# Patient Record
Sex: Female | Born: 1975 | Race: Black or African American | Hispanic: No | Marital: Single | Smoking: Never smoker
Health system: Southern US, Community
[De-identification: ages and names within clinical notes are randomized; demographics above are authoritative.]

## PROBLEM LIST (undated history)

## (undated) ENCOUNTER — Emergency Department (HOSPITAL_COMMUNITY): Admission: EM | Payer: Medicare Other

## (undated) DIAGNOSIS — L709 Acne, unspecified: Secondary | ICD-10-CM

## (undated) DIAGNOSIS — D649 Anemia, unspecified: Secondary | ICD-10-CM

## (undated) DIAGNOSIS — F70 Mild intellectual disabilities: Secondary | ICD-10-CM

## (undated) DIAGNOSIS — I471 Supraventricular tachycardia, unspecified: Secondary | ICD-10-CM

## (undated) DIAGNOSIS — K648 Other hemorrhoids: Secondary | ICD-10-CM

## (undated) DIAGNOSIS — E669 Obesity, unspecified: Secondary | ICD-10-CM

## (undated) DIAGNOSIS — E119 Type 2 diabetes mellitus without complications: Secondary | ICD-10-CM

## (undated) HISTORY — DX: Obesity, unspecified: E66.9

## (undated) HISTORY — DX: Anemia, unspecified: D64.9

## (undated) HISTORY — DX: Supraventricular tachycardia, unspecified: I47.10

## (undated) HISTORY — DX: Other hemorrhoids: K64.8

## (undated) HISTORY — DX: Supraventricular tachycardia: I47.1

---

## 1997-11-27 ENCOUNTER — Inpatient Hospital Stay (HOSPITAL_COMMUNITY): Admission: AD | Admit: 1997-11-27 | Discharge: 1997-12-04 | Payer: Self-pay | Admitting: Family Medicine

## 1997-12-18 ENCOUNTER — Emergency Department (HOSPITAL_COMMUNITY): Admission: EM | Admit: 1997-12-18 | Discharge: 1997-12-18 | Payer: Self-pay | Admitting: Emergency Medicine

## 1998-08-22 ENCOUNTER — Encounter: Admission: RE | Admit: 1998-08-22 | Discharge: 1998-08-22 | Payer: Self-pay | Admitting: Family Medicine

## 1998-09-24 ENCOUNTER — Encounter: Admission: RE | Admit: 1998-09-24 | Discharge: 1998-09-24 | Payer: Self-pay | Admitting: Family Medicine

## 1998-11-02 ENCOUNTER — Encounter: Admission: RE | Admit: 1998-11-02 | Discharge: 1998-11-02 | Payer: Self-pay | Admitting: Family Medicine

## 1998-11-20 ENCOUNTER — Encounter: Admission: RE | Admit: 1998-11-20 | Discharge: 1998-11-20 | Payer: Self-pay | Admitting: Sports Medicine

## 1999-01-22 ENCOUNTER — Encounter: Admission: RE | Admit: 1999-01-22 | Discharge: 1999-01-22 | Payer: Self-pay | Admitting: Family Medicine

## 1999-02-05 ENCOUNTER — Other Ambulatory Visit: Admission: RE | Admit: 1999-02-05 | Discharge: 1999-02-05 | Payer: Self-pay | Admitting: *Deleted

## 1999-02-05 ENCOUNTER — Encounter: Admission: RE | Admit: 1999-02-05 | Discharge: 1999-02-05 | Payer: Self-pay | Admitting: Family Medicine

## 1999-02-11 ENCOUNTER — Ambulatory Visit (HOSPITAL_COMMUNITY): Admission: RE | Admit: 1999-02-11 | Discharge: 1999-02-11 | Payer: Self-pay | Admitting: Sports Medicine

## 1999-02-11 ENCOUNTER — Encounter: Payer: Self-pay | Admitting: Sports Medicine

## 1999-11-18 ENCOUNTER — Encounter: Admission: RE | Admit: 1999-11-18 | Discharge: 1999-11-18 | Payer: Self-pay | Admitting: Family Medicine

## 2000-01-16 ENCOUNTER — Encounter: Admission: RE | Admit: 2000-01-16 | Discharge: 2000-01-16 | Payer: Self-pay | Admitting: Family Medicine

## 2000-03-02 ENCOUNTER — Encounter: Admission: RE | Admit: 2000-03-02 | Discharge: 2000-03-02 | Payer: Self-pay | Admitting: Family Medicine

## 2000-05-26 ENCOUNTER — Encounter: Admission: RE | Admit: 2000-05-26 | Discharge: 2000-05-26 | Payer: Self-pay | Admitting: Sports Medicine

## 2000-06-02 ENCOUNTER — Encounter: Payer: Self-pay | Admitting: Chiropractic Medicine

## 2000-06-02 ENCOUNTER — Encounter: Admission: RE | Admit: 2000-06-02 | Discharge: 2000-06-02 | Payer: Self-pay | Admitting: Chiropractic Medicine

## 2000-07-27 ENCOUNTER — Encounter: Admission: RE | Admit: 2000-07-27 | Discharge: 2000-07-27 | Payer: Self-pay | Admitting: Family Medicine

## 2000-08-19 ENCOUNTER — Encounter: Admission: RE | Admit: 2000-08-19 | Discharge: 2000-08-19 | Payer: Self-pay | Admitting: Family Medicine

## 2000-08-21 ENCOUNTER — Encounter: Admission: RE | Admit: 2000-08-21 | Discharge: 2000-08-21 | Payer: Self-pay | Admitting: Family Medicine

## 2000-10-23 ENCOUNTER — Encounter: Admission: RE | Admit: 2000-10-23 | Discharge: 2000-10-23 | Payer: Self-pay | Admitting: Family Medicine

## 2000-11-17 ENCOUNTER — Encounter: Admission: RE | Admit: 2000-11-17 | Discharge: 2000-11-17 | Payer: Self-pay | Admitting: Sports Medicine

## 2001-02-19 ENCOUNTER — Encounter: Admission: RE | Admit: 2001-02-19 | Discharge: 2001-02-19 | Payer: Self-pay | Admitting: Family Medicine

## 2001-04-21 ENCOUNTER — Emergency Department (HOSPITAL_COMMUNITY): Admission: EM | Admit: 2001-04-21 | Discharge: 2001-04-21 | Payer: Self-pay

## 2001-05-19 ENCOUNTER — Encounter: Admission: RE | Admit: 2001-05-19 | Discharge: 2001-05-19 | Payer: Self-pay | Admitting: Family Medicine

## 2001-08-05 ENCOUNTER — Ambulatory Visit (HOSPITAL_COMMUNITY): Admission: RE | Admit: 2001-08-05 | Discharge: 2001-08-05 | Payer: Self-pay | Admitting: Family Medicine

## 2001-08-05 ENCOUNTER — Encounter: Admission: RE | Admit: 2001-08-05 | Discharge: 2001-08-05 | Payer: Self-pay | Admitting: Family Medicine

## 2001-08-11 ENCOUNTER — Emergency Department (HOSPITAL_COMMUNITY): Admission: EM | Admit: 2001-08-11 | Discharge: 2001-08-11 | Payer: Self-pay | Admitting: Emergency Medicine

## 2001-08-13 ENCOUNTER — Inpatient Hospital Stay (HOSPITAL_COMMUNITY): Admission: EM | Admit: 2001-08-13 | Discharge: 2001-08-17 | Payer: Self-pay | Admitting: *Deleted

## 2001-08-17 ENCOUNTER — Inpatient Hospital Stay (HOSPITAL_COMMUNITY): Admission: EM | Admit: 2001-08-17 | Discharge: 2001-08-26 | Payer: Self-pay | Admitting: Psychiatry

## 2001-08-18 ENCOUNTER — Encounter: Admission: RE | Admit: 2001-08-18 | Discharge: 2001-08-18 | Payer: Self-pay | Admitting: Family Medicine

## 2001-11-23 ENCOUNTER — Encounter: Admission: RE | Admit: 2001-11-23 | Discharge: 2001-11-23 | Payer: Self-pay | Admitting: Family Medicine

## 2002-01-20 ENCOUNTER — Encounter: Admission: RE | Admit: 2002-01-20 | Discharge: 2002-01-20 | Payer: Self-pay | Admitting: Family Medicine

## 2002-02-03 ENCOUNTER — Encounter: Admission: RE | Admit: 2002-02-03 | Discharge: 2002-02-03 | Payer: Self-pay | Admitting: Family Medicine

## 2002-05-20 ENCOUNTER — Encounter (INDEPENDENT_AMBULATORY_CARE_PROVIDER_SITE_OTHER): Payer: Self-pay | Admitting: *Deleted

## 2002-05-20 ENCOUNTER — Ambulatory Visit (HOSPITAL_BASED_OUTPATIENT_CLINIC_OR_DEPARTMENT_OTHER): Admission: RE | Admit: 2002-05-20 | Discharge: 2002-05-20 | Payer: Self-pay | Admitting: Ophthalmology

## 2002-06-02 ENCOUNTER — Encounter: Admission: RE | Admit: 2002-06-02 | Discharge: 2002-06-02 | Payer: Self-pay | Admitting: Family Medicine

## 2002-06-30 ENCOUNTER — Encounter: Admission: RE | Admit: 2002-06-30 | Discharge: 2002-06-30 | Payer: Self-pay | Admitting: Radiology

## 2002-07-26 ENCOUNTER — Encounter: Admission: RE | Admit: 2002-07-26 | Discharge: 2002-07-26 | Payer: Self-pay | Admitting: Family Medicine

## 2002-08-10 ENCOUNTER — Encounter: Admission: RE | Admit: 2002-08-10 | Discharge: 2002-08-10 | Payer: Self-pay | Admitting: Family Medicine

## 2002-11-23 ENCOUNTER — Encounter: Admission: RE | Admit: 2002-11-23 | Discharge: 2002-11-23 | Payer: Self-pay | Admitting: Family Medicine

## 2002-11-25 ENCOUNTER — Encounter: Admission: RE | Admit: 2002-11-25 | Discharge: 2002-11-25 | Payer: Self-pay | Admitting: Sports Medicine

## 2002-12-16 ENCOUNTER — Encounter: Admission: RE | Admit: 2002-12-16 | Discharge: 2002-12-16 | Payer: Self-pay | Admitting: Family Medicine

## 2003-01-12 ENCOUNTER — Emergency Department (HOSPITAL_COMMUNITY): Admission: EM | Admit: 2003-01-12 | Discharge: 2003-01-12 | Payer: Self-pay | Admitting: Emergency Medicine

## 2003-01-13 ENCOUNTER — Inpatient Hospital Stay (HOSPITAL_COMMUNITY): Admission: EM | Admit: 2003-01-13 | Discharge: 2003-01-16 | Payer: Self-pay | Admitting: Emergency Medicine

## 2003-01-28 ENCOUNTER — Emergency Department (HOSPITAL_COMMUNITY): Admission: EM | Admit: 2003-01-28 | Discharge: 2003-01-28 | Payer: Self-pay | Admitting: Emergency Medicine

## 2003-02-13 ENCOUNTER — Encounter: Admission: RE | Admit: 2003-02-13 | Discharge: 2003-02-13 | Payer: Self-pay | Admitting: Family Medicine

## 2003-03-02 ENCOUNTER — Emergency Department (HOSPITAL_COMMUNITY): Admission: EM | Admit: 2003-03-02 | Discharge: 2003-03-03 | Payer: Self-pay

## 2003-03-03 ENCOUNTER — Encounter: Payer: Self-pay | Admitting: Emergency Medicine

## 2003-03-06 ENCOUNTER — Emergency Department (HOSPITAL_COMMUNITY): Admission: EM | Admit: 2003-03-06 | Discharge: 2003-03-06 | Payer: Self-pay | Admitting: Emergency Medicine

## 2003-04-30 ENCOUNTER — Emergency Department (HOSPITAL_COMMUNITY): Admission: EM | Admit: 2003-04-30 | Discharge: 2003-04-30 | Payer: Self-pay | Admitting: Emergency Medicine

## 2003-05-01 ENCOUNTER — Emergency Department (HOSPITAL_COMMUNITY): Admission: EM | Admit: 2003-05-01 | Discharge: 2003-05-01 | Payer: Self-pay | Admitting: Emergency Medicine

## 2003-05-22 ENCOUNTER — Encounter: Admission: RE | Admit: 2003-05-22 | Discharge: 2003-05-22 | Payer: Self-pay | Admitting: Sports Medicine

## 2003-06-22 ENCOUNTER — Encounter: Admission: RE | Admit: 2003-06-22 | Discharge: 2003-06-22 | Payer: Self-pay | Admitting: Sports Medicine

## 2003-09-28 ENCOUNTER — Encounter: Admission: RE | Admit: 2003-09-28 | Discharge: 2003-09-28 | Payer: Self-pay | Admitting: *Deleted

## 2004-01-11 ENCOUNTER — Ambulatory Visit: Payer: Self-pay | Admitting: Sports Medicine

## 2004-03-07 ENCOUNTER — Ambulatory Visit: Payer: Self-pay | Admitting: Family Medicine

## 2004-03-08 ENCOUNTER — Ambulatory Visit (HOSPITAL_COMMUNITY): Admission: RE | Admit: 2004-03-08 | Discharge: 2004-03-08 | Payer: Self-pay | Admitting: *Deleted

## 2004-03-12 ENCOUNTER — Ambulatory Visit (HOSPITAL_COMMUNITY): Admission: RE | Admit: 2004-03-12 | Discharge: 2004-03-12 | Payer: Self-pay | Admitting: *Deleted

## 2004-03-28 ENCOUNTER — Ambulatory Visit: Payer: Self-pay | Admitting: Obstetrics and Gynecology

## 2004-04-15 ENCOUNTER — Encounter (INDEPENDENT_AMBULATORY_CARE_PROVIDER_SITE_OTHER): Payer: Self-pay | Admitting: *Deleted

## 2004-04-24 ENCOUNTER — Ambulatory Visit: Payer: Self-pay | Admitting: Family Medicine

## 2004-05-07 ENCOUNTER — Encounter: Admission: RE | Admit: 2004-05-07 | Discharge: 2004-08-05 | Payer: Self-pay | Admitting: Family Medicine

## 2004-05-24 ENCOUNTER — Ambulatory Visit: Payer: Self-pay | Admitting: Family Medicine

## 2004-08-09 ENCOUNTER — Ambulatory Visit: Payer: Self-pay | Admitting: Obstetrics & Gynecology

## 2004-08-20 ENCOUNTER — Ambulatory Visit: Payer: Self-pay | Admitting: Obstetrics & Gynecology

## 2004-09-09 HISTORY — PX: HYSTEROSCOPY: SHX211

## 2004-09-30 ENCOUNTER — Ambulatory Visit (HOSPITAL_COMMUNITY): Admission: RE | Admit: 2004-09-30 | Discharge: 2004-09-30 | Payer: Self-pay | Admitting: Obstetrics & Gynecology

## 2004-09-30 ENCOUNTER — Ambulatory Visit: Payer: Self-pay | Admitting: Obstetrics & Gynecology

## 2004-09-30 ENCOUNTER — Encounter (INDEPENDENT_AMBULATORY_CARE_PROVIDER_SITE_OTHER): Payer: Self-pay | Admitting: *Deleted

## 2004-10-14 ENCOUNTER — Ambulatory Visit: Payer: Self-pay | Admitting: Family Medicine

## 2004-10-25 ENCOUNTER — Ambulatory Visit: Payer: Self-pay | Admitting: *Deleted

## 2004-11-05 ENCOUNTER — Ambulatory Visit: Payer: Self-pay | Admitting: Obstetrics and Gynecology

## 2004-11-14 ENCOUNTER — Encounter: Admission: RE | Admit: 2004-11-14 | Discharge: 2004-11-14 | Payer: Self-pay | Admitting: Family Medicine

## 2005-01-21 ENCOUNTER — Ambulatory Visit: Payer: Self-pay | Admitting: *Deleted

## 2005-02-11 ENCOUNTER — Ambulatory Visit: Payer: Self-pay | Admitting: *Deleted

## 2005-04-08 ENCOUNTER — Ambulatory Visit: Payer: Self-pay | Admitting: *Deleted

## 2005-04-24 ENCOUNTER — Ambulatory Visit: Payer: Self-pay | Admitting: Sports Medicine

## 2005-05-12 HISTORY — PX: REDUCTION MAMMAPLASTY: SUR839

## 2005-05-22 ENCOUNTER — Encounter: Admission: RE | Admit: 2005-05-22 | Discharge: 2005-08-20 | Payer: Self-pay | Admitting: Family Medicine

## 2005-06-24 ENCOUNTER — Ambulatory Visit: Payer: Self-pay | Admitting: *Deleted

## 2005-08-04 ENCOUNTER — Encounter (INDEPENDENT_AMBULATORY_CARE_PROVIDER_SITE_OTHER): Payer: Self-pay | Admitting: *Deleted

## 2005-08-04 ENCOUNTER — Ambulatory Visit (HOSPITAL_BASED_OUTPATIENT_CLINIC_OR_DEPARTMENT_OTHER): Admission: RE | Admit: 2005-08-04 | Discharge: 2005-08-05 | Payer: Self-pay | Admitting: Specialist

## 2005-08-10 HISTORY — PX: BREAST REDUCTION SURGERY: SHX8

## 2005-08-20 ENCOUNTER — Encounter: Admission: RE | Admit: 2005-08-20 | Discharge: 2005-08-20 | Payer: Self-pay | Admitting: Family Medicine

## 2005-09-15 ENCOUNTER — Ambulatory Visit: Payer: Self-pay | Admitting: *Deleted

## 2005-10-21 ENCOUNTER — Ambulatory Visit: Payer: Self-pay | Admitting: Sports Medicine

## 2005-11-19 ENCOUNTER — Encounter: Admission: RE | Admit: 2005-11-19 | Discharge: 2005-11-19 | Payer: Self-pay | Admitting: Family Medicine

## 2005-12-01 ENCOUNTER — Ambulatory Visit: Payer: Self-pay | Admitting: Obstetrics & Gynecology

## 2006-02-17 ENCOUNTER — Ambulatory Visit: Payer: Self-pay | Admitting: Obstetrics and Gynecology

## 2006-02-19 ENCOUNTER — Encounter: Admission: RE | Admit: 2006-02-19 | Discharge: 2006-05-20 | Payer: Self-pay | Admitting: Family Medicine

## 2006-02-27 ENCOUNTER — Ambulatory Visit: Payer: Self-pay | Admitting: Family Medicine

## 2006-04-10 ENCOUNTER — Ambulatory Visit: Payer: Self-pay | Admitting: Family Medicine

## 2006-05-06 ENCOUNTER — Ambulatory Visit: Payer: Self-pay | Admitting: Obstetrics & Gynecology

## 2006-05-21 ENCOUNTER — Encounter: Admission: RE | Admit: 2006-05-21 | Discharge: 2006-08-19 | Payer: Self-pay | Admitting: Family Medicine

## 2006-07-09 DIAGNOSIS — F79 Unspecified intellectual disabilities: Secondary | ICD-10-CM | POA: Insufficient documentation

## 2006-07-09 DIAGNOSIS — F209 Schizophrenia, unspecified: Secondary | ICD-10-CM | POA: Insufficient documentation

## 2006-07-10 ENCOUNTER — Encounter (INDEPENDENT_AMBULATORY_CARE_PROVIDER_SITE_OTHER): Payer: Self-pay | Admitting: *Deleted

## 2006-08-03 ENCOUNTER — Ambulatory Visit: Payer: Self-pay | Admitting: Gynecology

## 2006-08-25 ENCOUNTER — Encounter (INDEPENDENT_AMBULATORY_CARE_PROVIDER_SITE_OTHER): Payer: Self-pay | Admitting: Family Medicine

## 2006-09-02 ENCOUNTER — Encounter (INDEPENDENT_AMBULATORY_CARE_PROVIDER_SITE_OTHER): Payer: Self-pay | Admitting: Family Medicine

## 2006-10-08 ENCOUNTER — Ambulatory Visit: Payer: Self-pay | Admitting: Family Medicine

## 2006-10-08 ENCOUNTER — Encounter (INDEPENDENT_AMBULATORY_CARE_PROVIDER_SITE_OTHER): Payer: Self-pay | Admitting: Family Medicine

## 2006-10-08 ENCOUNTER — Ambulatory Visit (HOSPITAL_COMMUNITY): Admission: RE | Admit: 2006-10-08 | Discharge: 2006-10-08 | Payer: Self-pay | Admitting: Family Medicine

## 2006-10-08 LAB — CONVERTED CEMR LAB
Chloride: 111 meq/L (ref 96–112)
Creatinine, Ser: 1.57 mg/dL — ABNORMAL HIGH (ref 0.40–1.20)
MCV: 88.5 fL
Platelets: 148 10*3/uL
RBC: 4.56 M/uL
TSH: 0.627 microintl units/mL (ref 0.350–5.50)

## 2006-10-15 ENCOUNTER — Encounter: Admission: RE | Admit: 2006-10-15 | Discharge: 2006-10-15 | Payer: Self-pay | Admitting: Family Medicine

## 2006-10-22 ENCOUNTER — Ambulatory Visit: Payer: Self-pay | Admitting: Obstetrics & Gynecology

## 2006-10-26 ENCOUNTER — Encounter (INDEPENDENT_AMBULATORY_CARE_PROVIDER_SITE_OTHER): Payer: Self-pay | Admitting: Family Medicine

## 2006-10-30 ENCOUNTER — Encounter (INDEPENDENT_AMBULATORY_CARE_PROVIDER_SITE_OTHER): Payer: Self-pay | Admitting: Family Medicine

## 2007-01-12 ENCOUNTER — Ambulatory Visit: Payer: Self-pay | Admitting: Obstetrics and Gynecology

## 2007-02-23 ENCOUNTER — Encounter: Admission: RE | Admit: 2007-02-23 | Discharge: 2007-02-23 | Payer: Self-pay | Admitting: Family Medicine

## 2007-03-02 ENCOUNTER — Encounter (INDEPENDENT_AMBULATORY_CARE_PROVIDER_SITE_OTHER): Payer: Self-pay | Admitting: Family Medicine

## 2007-04-06 ENCOUNTER — Ambulatory Visit: Payer: Self-pay | Admitting: Obstetrics & Gynecology

## 2007-04-12 ENCOUNTER — Encounter: Admission: RE | Admit: 2007-04-12 | Discharge: 2007-04-12 | Payer: Self-pay | Admitting: Family Medicine

## 2007-04-12 ENCOUNTER — Encounter (INDEPENDENT_AMBULATORY_CARE_PROVIDER_SITE_OTHER): Payer: Self-pay | Admitting: Family Medicine

## 2007-04-12 ENCOUNTER — Ambulatory Visit: Payer: Self-pay | Admitting: Sports Medicine

## 2007-04-12 DIAGNOSIS — K5900 Constipation, unspecified: Secondary | ICD-10-CM | POA: Insufficient documentation

## 2007-04-12 DIAGNOSIS — K5909 Other constipation: Secondary | ICD-10-CM | POA: Insufficient documentation

## 2007-04-12 LAB — CONVERTED CEMR LAB
BUN: 19 mg/dL (ref 6–23)
Calcium: 9.6 mg/dL (ref 8.4–10.5)
Creatinine, Ser: 1.61 mg/dL — ABNORMAL HIGH (ref 0.40–1.20)
Potassium: 4.4 meq/L (ref 3.5–5.3)
Whiff Test: NEGATIVE

## 2007-04-23 ENCOUNTER — Telehealth (INDEPENDENT_AMBULATORY_CARE_PROVIDER_SITE_OTHER): Payer: Self-pay | Admitting: *Deleted

## 2007-04-26 ENCOUNTER — Ambulatory Visit: Payer: Self-pay | Admitting: Internal Medicine

## 2007-06-09 ENCOUNTER — Telehealth: Payer: Self-pay | Admitting: Family Medicine

## 2007-06-09 ENCOUNTER — Telehealth: Payer: Self-pay | Admitting: *Deleted

## 2007-06-17 ENCOUNTER — Telehealth: Payer: Self-pay | Admitting: *Deleted

## 2007-06-23 ENCOUNTER — Ambulatory Visit: Payer: Self-pay | Admitting: Gynecology

## 2007-06-29 ENCOUNTER — Encounter (INDEPENDENT_AMBULATORY_CARE_PROVIDER_SITE_OTHER): Payer: Self-pay | Admitting: Family Medicine

## 2007-06-29 ENCOUNTER — Encounter: Admission: RE | Admit: 2007-06-29 | Discharge: 2007-06-29 | Payer: Self-pay | Admitting: Family Medicine

## 2007-09-09 ENCOUNTER — Ambulatory Visit: Payer: Self-pay | Admitting: Family Medicine

## 2007-09-09 ENCOUNTER — Encounter (INDEPENDENT_AMBULATORY_CARE_PROVIDER_SITE_OTHER): Payer: Self-pay | Admitting: Family Medicine

## 2007-09-09 LAB — CONVERTED CEMR LAB
CO2: 23 meq/L (ref 19–32)
Calcium: 9.5 mg/dL (ref 8.4–10.5)
Microalbumin U total vol: NEGATIVE mg/L
Sodium: 143 meq/L (ref 135–145)

## 2007-09-14 ENCOUNTER — Ambulatory Visit: Payer: Self-pay | Admitting: Family Medicine

## 2007-09-28 ENCOUNTER — Encounter: Admission: RE | Admit: 2007-09-28 | Discharge: 2007-09-28 | Payer: Self-pay | Admitting: Family Medicine

## 2007-09-28 ENCOUNTER — Encounter (INDEPENDENT_AMBULATORY_CARE_PROVIDER_SITE_OTHER): Payer: Self-pay | Admitting: Family Medicine

## 2007-12-21 ENCOUNTER — Ambulatory Visit: Payer: Self-pay | Admitting: Gynecology

## 2007-12-28 ENCOUNTER — Encounter: Admission: RE | Admit: 2007-12-28 | Discharge: 2008-02-01 | Payer: Self-pay | Admitting: Family Medicine

## 2008-01-06 ENCOUNTER — Encounter: Payer: Self-pay | Admitting: Family Medicine

## 2008-01-25 ENCOUNTER — Encounter (INDEPENDENT_AMBULATORY_CARE_PROVIDER_SITE_OTHER): Payer: Self-pay | Admitting: *Deleted

## 2008-02-03 ENCOUNTER — Encounter: Payer: Self-pay | Admitting: Family Medicine

## 2008-03-15 ENCOUNTER — Ambulatory Visit: Payer: Self-pay | Admitting: Obstetrics & Gynecology

## 2008-04-13 ENCOUNTER — Ambulatory Visit: Payer: Self-pay | Admitting: Family Medicine

## 2008-04-13 ENCOUNTER — Encounter: Payer: Self-pay | Admitting: Family Medicine

## 2008-04-13 DIAGNOSIS — Z8679 Personal history of other diseases of the circulatory system: Secondary | ICD-10-CM | POA: Insufficient documentation

## 2008-04-13 LAB — CONVERTED CEMR LAB: Hgb A1c MFr Bld: 5.1 %

## 2008-04-14 ENCOUNTER — Encounter: Payer: Self-pay | Admitting: Family Medicine

## 2008-04-18 ENCOUNTER — Encounter: Payer: Self-pay | Admitting: Family Medicine

## 2008-04-18 ENCOUNTER — Telehealth: Payer: Self-pay | Admitting: Family Medicine

## 2008-04-18 ENCOUNTER — Ambulatory Visit: Payer: Self-pay | Admitting: Family Medicine

## 2008-04-18 LAB — CONVERTED CEMR LAB
ALT: 8 units/L (ref 0–35)
Albumin: 4.3 g/dL (ref 3.5–5.2)
CO2: 23 meq/L (ref 19–32)
Cholesterol: 133 mg/dL (ref 0–200)
Glucose, Bld: 86 mg/dL (ref 70–99)
LDL Cholesterol: 76 mg/dL (ref 0–99)
Microalbumin U total vol: NEGATIVE mg/L
Platelets: 142 10*3/uL — ABNORMAL LOW (ref 150–400)
Potassium: 4.3 meq/L (ref 3.5–5.3)
RBC: 4.71 M/uL (ref 3.87–5.11)
Sodium: 144 meq/L (ref 135–145)
Total Protein: 7.1 g/dL (ref 6.0–8.3)
Triglycerides: 80 mg/dL (ref <150)
WBC: 5 10*3/uL (ref 4.0–10.5)

## 2008-04-20 ENCOUNTER — Telehealth: Payer: Self-pay | Admitting: Family Medicine

## 2008-05-19 ENCOUNTER — Telehealth: Payer: Self-pay | Admitting: *Deleted

## 2008-05-31 ENCOUNTER — Ambulatory Visit: Payer: Self-pay | Admitting: Obstetrics and Gynecology

## 2008-06-05 ENCOUNTER — Ambulatory Visit: Payer: Self-pay | Admitting: Family Medicine

## 2008-06-05 ENCOUNTER — Other Ambulatory Visit: Admission: RE | Admit: 2008-06-05 | Discharge: 2008-06-05 | Payer: Self-pay | Admitting: Family Medicine

## 2008-06-05 ENCOUNTER — Encounter: Payer: Self-pay | Admitting: Family Medicine

## 2008-06-07 ENCOUNTER — Encounter: Payer: Self-pay | Admitting: Family Medicine

## 2008-06-28 ENCOUNTER — Telehealth: Payer: Self-pay | Admitting: Family Medicine

## 2008-07-13 ENCOUNTER — Encounter: Payer: Self-pay | Admitting: Family Medicine

## 2008-07-13 ENCOUNTER — Ambulatory Visit: Payer: Self-pay | Admitting: Family Medicine

## 2008-07-13 LAB — CONVERTED CEMR LAB
BUN: 12 mg/dL (ref 6–23)
Bilirubin Urine: NEGATIVE
Chloride: 108 meq/L (ref 96–112)
Creatinine, Ser: 1.19 mg/dL (ref 0.40–1.20)
Glucose, Urine, Semiquant: NEGATIVE
Ketones, urine, test strip: NEGATIVE
Protein, U semiquant: NEGATIVE
Urobilinogen, UA: 0.2
pH: 7

## 2008-08-22 ENCOUNTER — Ambulatory Visit: Payer: Self-pay | Admitting: Obstetrics & Gynecology

## 2008-08-31 ENCOUNTER — Ambulatory Visit: Payer: Self-pay | Admitting: Family Medicine

## 2008-08-31 LAB — CONVERTED CEMR LAB: Hgb A1c MFr Bld: 5.1 %

## 2008-09-04 ENCOUNTER — Encounter: Payer: Self-pay | Admitting: Family Medicine

## 2008-09-04 ENCOUNTER — Ambulatory Visit: Payer: Self-pay | Admitting: Family Medicine

## 2008-09-04 LAB — CONVERTED CEMR LAB
ALT: 10 units/L (ref 0–35)
Albumin: 3.9 g/dL (ref 3.5–5.2)
CO2: 24 meq/L (ref 19–32)
Calcium: 9.3 mg/dL (ref 8.4–10.5)
Chloride: 109 meq/L (ref 96–112)
Glucose, Bld: 80 mg/dL (ref 70–99)
MCV: 89.3 fL (ref 78.0–100.0)
Platelets: 133 10*3/uL — ABNORMAL LOW (ref 150–400)
Potassium: 4.4 meq/L (ref 3.5–5.3)
Prealbumin: 25.7 mg/dL (ref 18.0–45.0)
RBC: 4.39 M/uL (ref 3.87–5.11)
Sodium: 144 meq/L (ref 135–145)
Total Protein: 6.7 g/dL (ref 6.0–8.3)
WBC: 5.6 10*3/uL (ref 4.0–10.5)

## 2008-09-07 ENCOUNTER — Telehealth: Payer: Self-pay | Admitting: Family Medicine

## 2008-09-07 ENCOUNTER — Ambulatory Visit: Payer: Self-pay | Admitting: Family Medicine

## 2008-09-11 ENCOUNTER — Encounter: Payer: Self-pay | Admitting: Family Medicine

## 2008-09-13 ENCOUNTER — Telehealth: Payer: Self-pay | Admitting: Family Medicine

## 2008-09-18 ENCOUNTER — Telehealth (INDEPENDENT_AMBULATORY_CARE_PROVIDER_SITE_OTHER): Payer: Self-pay | Admitting: *Deleted

## 2008-09-25 ENCOUNTER — Telehealth: Payer: Self-pay | Admitting: Family Medicine

## 2008-10-10 ENCOUNTER — Telehealth: Payer: Self-pay | Admitting: *Deleted

## 2008-10-27 ENCOUNTER — Ambulatory Visit: Payer: Self-pay | Admitting: Family Medicine

## 2008-11-06 ENCOUNTER — Encounter: Payer: Self-pay | Admitting: *Deleted

## 2008-11-21 ENCOUNTER — Ambulatory Visit: Payer: Self-pay | Admitting: Family Medicine

## 2008-11-21 LAB — CONVERTED CEMR LAB

## 2008-11-22 ENCOUNTER — Telehealth: Payer: Self-pay | Admitting: Family Medicine

## 2009-01-01 ENCOUNTER — Ambulatory Visit: Payer: Self-pay | Admitting: Family Medicine

## 2009-01-01 ENCOUNTER — Encounter: Payer: Self-pay | Admitting: *Deleted

## 2009-01-01 LAB — CONVERTED CEMR LAB: Beta hcg, urine, semiquantitative: NEGATIVE

## 2009-01-18 ENCOUNTER — Encounter: Payer: Self-pay | Admitting: Family Medicine

## 2009-01-18 ENCOUNTER — Encounter: Admission: RE | Admit: 2009-01-18 | Discharge: 2009-01-18 | Payer: Self-pay | Admitting: Family Medicine

## 2009-01-26 ENCOUNTER — Encounter (INDEPENDENT_AMBULATORY_CARE_PROVIDER_SITE_OTHER): Payer: Self-pay | Admitting: *Deleted

## 2009-02-14 ENCOUNTER — Ambulatory Visit: Payer: Self-pay | Admitting: Family Medicine

## 2009-02-26 ENCOUNTER — Ambulatory Visit: Payer: Self-pay | Admitting: Family Medicine

## 2009-03-05 ENCOUNTER — Encounter: Payer: Self-pay | Admitting: Family Medicine

## 2009-03-05 ENCOUNTER — Ambulatory Visit: Payer: Self-pay | Admitting: Family Medicine

## 2009-03-08 ENCOUNTER — Ambulatory Visit: Payer: Self-pay | Admitting: Internal Medicine

## 2009-03-22 ENCOUNTER — Ambulatory Visit: Payer: Self-pay | Admitting: Obstetrics and Gynecology

## 2009-04-02 ENCOUNTER — Encounter: Payer: Self-pay | Admitting: Family Medicine

## 2009-05-09 ENCOUNTER — Encounter: Admission: RE | Admit: 2009-05-09 | Discharge: 2009-08-07 | Payer: Self-pay | Admitting: Family Medicine

## 2009-06-06 ENCOUNTER — Ambulatory Visit: Payer: Self-pay | Admitting: Internal Medicine

## 2009-06-07 ENCOUNTER — Ambulatory Visit: Payer: Self-pay | Admitting: Obstetrics & Gynecology

## 2009-06-13 ENCOUNTER — Ambulatory Visit: Payer: Self-pay | Admitting: Family Medicine

## 2009-06-13 DIAGNOSIS — E663 Overweight: Secondary | ICD-10-CM | POA: Insufficient documentation

## 2009-06-13 LAB — CONVERTED CEMR LAB: Hgb A1c MFr Bld: 6.1 %

## 2009-07-05 ENCOUNTER — Encounter: Payer: Self-pay | Admitting: Family Medicine

## 2009-08-23 ENCOUNTER — Ambulatory Visit: Payer: Self-pay | Admitting: Obstetrics & Gynecology

## 2009-09-12 ENCOUNTER — Ambulatory Visit: Payer: Self-pay | Admitting: Family Medicine

## 2009-09-12 ENCOUNTER — Encounter: Payer: Self-pay | Admitting: Family Medicine

## 2009-09-12 LAB — CONVERTED CEMR LAB
Chlamydia, DNA Probe: NEGATIVE
Whiff Test: NEGATIVE

## 2009-09-13 ENCOUNTER — Telehealth: Payer: Self-pay | Admitting: *Deleted

## 2009-10-10 ENCOUNTER — Encounter: Admission: RE | Admit: 2009-10-10 | Discharge: 2009-10-10 | Payer: Self-pay | Admitting: Family Medicine

## 2009-10-23 ENCOUNTER — Ambulatory Visit: Payer: Self-pay | Admitting: Obstetrics & Gynecology

## 2009-11-08 ENCOUNTER — Ambulatory Visit: Payer: Self-pay | Admitting: Obstetrics & Gynecology

## 2009-12-30 ENCOUNTER — Emergency Department (HOSPITAL_COMMUNITY): Admission: EM | Admit: 2009-12-30 | Discharge: 2009-12-30 | Payer: Self-pay | Admitting: Emergency Medicine

## 2010-01-07 ENCOUNTER — Ambulatory Visit: Payer: Self-pay | Admitting: Family Medicine

## 2010-01-08 ENCOUNTER — Ambulatory Visit: Payer: Self-pay | Admitting: Family Medicine

## 2010-01-08 ENCOUNTER — Encounter: Payer: Self-pay | Admitting: Family Medicine

## 2010-01-08 LAB — CONVERTED CEMR LAB
ALT: 9 units/L (ref 0–35)
AST: 12 units/L (ref 0–37)
Calcium: 9.2 mg/dL (ref 8.4–10.5)
Chloride: 108 meq/L (ref 96–112)
Creatinine, Ser: 1.61 mg/dL — ABNORMAL HIGH (ref 0.40–1.20)
HCT: 40.4 % (ref 36.0–46.0)
Hgb A1c MFr Bld: 6.7 %
Platelets: 185 10*3/uL (ref 150–400)
RDW: 14.1 % (ref 11.5–15.5)
Sodium: 140 meq/L (ref 135–145)
Total Bilirubin: 0.4 mg/dL (ref 0.3–1.2)
Total CHOL/HDL Ratio: 4.5
Total Protein: 6.2 g/dL (ref 6.0–8.3)
VLDL: 28 mg/dL (ref 0–40)
WBC: 5.4 10*3/uL (ref 4.0–10.5)

## 2010-01-09 ENCOUNTER — Encounter: Payer: Self-pay | Admitting: Family Medicine

## 2010-01-17 ENCOUNTER — Encounter: Payer: Self-pay | Admitting: Family Medicine

## 2010-02-01 ENCOUNTER — Ambulatory Visit: Payer: Self-pay | Admitting: Family Medicine

## 2010-02-27 ENCOUNTER — Ambulatory Visit: Payer: Self-pay | Admitting: Obstetrics & Gynecology

## 2010-03-03 ENCOUNTER — Encounter: Payer: Self-pay | Admitting: Family Medicine

## 2010-03-03 DIAGNOSIS — N183 Chronic kidney disease, stage 3 unspecified: Secondary | ICD-10-CM | POA: Insufficient documentation

## 2010-03-03 DIAGNOSIS — E1129 Type 2 diabetes mellitus with other diabetic kidney complication: Secondary | ICD-10-CM | POA: Insufficient documentation

## 2010-03-26 ENCOUNTER — Encounter: Payer: Self-pay | Admitting: Family Medicine

## 2010-04-17 ENCOUNTER — Ambulatory Visit: Payer: Self-pay | Admitting: Family Medicine

## 2010-04-17 LAB — CONVERTED CEMR LAB: Hgb A1c MFr Bld: 5.7 %

## 2010-04-18 ENCOUNTER — Encounter: Payer: Self-pay | Admitting: Family Medicine

## 2010-04-18 ENCOUNTER — Telehealth: Payer: Self-pay | Admitting: Family Medicine

## 2010-05-07 ENCOUNTER — Encounter
Admission: RE | Admit: 2010-05-07 | Discharge: 2010-06-11 | Payer: Self-pay | Source: Home / Self Care | Attending: Family Medicine | Admitting: Family Medicine

## 2010-05-21 ENCOUNTER — Telehealth: Payer: Self-pay | Admitting: *Deleted

## 2010-05-22 ENCOUNTER — Ambulatory Visit
Admission: RE | Admit: 2010-05-22 | Discharge: 2010-05-22 | Payer: Self-pay | Source: Home / Self Care | Attending: Obstetrics and Gynecology | Admitting: Obstetrics and Gynecology

## 2010-05-29 ENCOUNTER — Telehealth: Payer: Self-pay | Admitting: Family Medicine

## 2010-06-11 NOTE — Miscellaneous (Signed)
Summary: FL2 assessment   Clinical Lists Changes Caregiver left form to completed by Dr. Janalyn Harder for patient's medications on [facility] form [name]  March 26, 2010 8:17 AM  FL2 form placed in Dr. Mack Hook box for completion.   [name] . sign     In "to be mailed box" Jacqueline Ta MD  March 28, 2010 10:24 PM  [name] notified [facility] are completed and ready to be picked up...Marland KitchenMarland KitchenMarland Kitchen [name]  March 29, 2010 9:37 AM

## 2010-06-11 NOTE — Assessment & Plan Note (Signed)
Summary: f/u,df   Vital Signs:  Patient profile:   35 year old female Height:      66 inches Weight:      186.5 pounds BMI:     30.21 Temp:     99.2 degrees F oral Pulse rate:   99 / minute BP sitting:   102 / 71  (left arm) Cuff size:   regular  Vitals Entered By: Garen Grams LPN (January 07, 2010 9:51 AM) CC: f/u Is Patient Diabetic? No Pain Assessment Patient in pain? no        Primary Care Provider:  Knox Holdman MD  CC:  f/u.  History of Present Illness: 35 y/o F with schizophrenia, MR, DM is here for   DIABETES Meds: metformin 500mg  daily  Compliance: yes, caregiver gives her med daily    Diet: does not like raw vegetables (carrots, apples)  Lightheadedness: no    Dizziness :no   Confusion:no    Shakiness: no  Abd  pain: no   Nausea: no    Vomiting: no  Exercise: does not exercise, napping during day.  sleeps during day most days.  wants to pay for membership to gym so that she can work out.   CBGs: 150s-160s average for fasting.  There were some that were 200.    Obesity: BMI now 30.2, which is increased from 29.8 No exercising.   School: doing well in school   Habits & Providers  Alcohol-Tobacco-Diet     Tobacco Status: never  Allergies: 1)  ! 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  Lives in [place] [facility] house in [place]. [phone], cell [phone] [phone].  Previously lived in group home (yrs ago). Twin sister with similar problem list. Has 24hr care.  no  tob/ETOH/Drugs/sexual activity.  Case manager:  Ewell Poe [phone]  Caregiver works for:  [employer]  [address], [place], [place] [address].  Rudean Haskell, Clinical Supervisor [phone]  Guardian:  Kristin Bruins of [employer] [place] [employer] [place].  [phone] office.  cell [phone]   Impression & Recommendations:  Problem # 1:  DIABETES MELLITUS, TYPE II, WITHOUT COMPLICATIONS (ICD-250.00) Assessment Unchanged Will come back to get FLP and A1C.  She has gained 10 lbs since 06/2009 and continues to gain weight since last year.  I've referred her to DM [facility] but there has not been improvement.  Pt refuses to work out.  She is very sedentary.  Her updated medication list for this problem includes:    Bayer Childrens Aspirin 81 Mg Chew (Aspirin) .Marland Kitchen... Take 1 tablet by mouth once a day    Metformin Hcl 500 Mg Tabs (Metformin hcl) .Marland Kitchen... 1 tab by mouth daily for high sugar  Orders: [facility]- Est  Level 4 (99214)Future Orders: A1C-[facility] ([id number]) ... 12/19/2010 Lipid-[facility] ([id number]) ... 12/20/2010 CBC-[facility] ([id number]) ... 12/27/2010  Problem # 2:  OVERWEIGHT (ICD-278.02) See above Caregiver, Rosita Fire willl call clinical supervisor, Roxan Hockey, to see if there is money for gym membersjip. Discussed [employer] since they do not  requrire long-term contract.   Continue calorie counts  Orders: [facility]- Est  Level 4 (99214)Future Orders: Lipid-[facility] ([id number]) ... 12/20/2010  Problem # 3:  RENAL INSUFFICIENCY, CHRONIC (ICD-586) Assessment: Unchanged will check renal function.  Baseline Cr 1.6-1.9 but recent Cr 1.2-1.3.  Pt is on Metformin so renal monitoring is necessary.   Orders: [facility]- Est  Level 4 (14782)  Complete Medication List: 1)  Bayer Childrens Aspirin 81 Mg Chew (Aspirin) .... Take 1 tablet by mouth once a day 2)  Clindagel 1 % Gel (Clindamycin phosphate) .... Apply a small amount to affected area twice a day 3)   Famotidine 20 Mg Tabs (Famotidine) .... Take 1 tablet by mouth twice a day 4)  Ascensia Elite Test Strp (Glucose blood) .... Check fasting blood sugar daily in am> 5)  Clindagel 1 % Gel (Clindamycin phosphate) .... Apply to affected area once daily 6)  Prodigy Autocode Blood Glucose Devi (Blood glucose monitoring suppl) .... Use as directed 7)  Prodigy Blood Glucose Test Strp (Glucose blood) .... Use as directed 8)  Prodigy Twist Top Lancets 28g Misc (Lancets) .... Use as directed 9)  Miralax Powd (Polyethylene glycol 3350) .Marland Kitchen.. 17g by mouth once daily as needed constipation 10)  Mylanta Gas 125 Mg Chew (Simethicone) .... One tablet by mouth daily as needed gas 11)  Benztropine Mesylate 1 Mg Tabs (Benztropine mesylate) .... Take 1 by mouth two times a day 12)  Divalproex Sodium 500 Mg Tbec (Divalproex sodium) .... Take 1 by mouth two times a day 13)  Paroxetine Hcl 30 Mg Tabs (Paroxetine hcl) .... Take 2 by mouth at bedtime 14)  Fazaclo 100 Mg Tbdp (Clozapine) .... Dissolve 4 tablets in mouth at bedtime 15)  Metoprolol Succinate 50 Mg Xr24h-tab (Metoprolol succinate) .... One by mouth once daily 16)  Differin 0.1 % Gel (Adapalene) .... Apply to skin at bedtime 17)  Depo-provera 150 Mg/ml Susp (Medroxyprogesterone acetate) .... Gets from whog 18)  Metformin Hcl 500 Mg Tabs (Metformin hcl) .Marland Kitchen.. 1 tab by mouth daily for high sugar 19)  Hydrocortisone 1 % Crea (Hydrocortisone) .... Apply to afftected areas two times a day as needed itchiness. may use 1-2 weeks. dispense 30 grams.  Other Orders: Future Orders: Comp Met-[facility] ([id number]) ... 12/19/2010  Patient Instructions: 1)  Please make appt for cholesterol check.  This has to be fasting, so no food or drink after midnight the night before appointment. 2)  Please schedule a follow-up appointment in 2 months.  3)  Please schedule a follow-up appointment in 1-2  months for weight. 4)  Stop Robaxin. 5)  Give Tylenol as needed pain in leg.  Do  not give Hydrocodone. 6)  For itchiness apply hydrocortisone cream two times a day x 1-2 weeks until itchiness is gone.  7)  Limit daytime napping to only 15 minutes per day. 8)  Continue calorie counting: 1600 calories per day.  Limits carbs (pasta, cakes, cookies, rice, potatoes, bread).  If you eat bread use whole wheat or whole grain.  9)  Metoprolol XR 50mg  by mouth daily.  Prescriptions: HYDROCORTISONE 1 % CREA (HYDROCORTISONE) Apply to afftected areas two times a day as needed itchiness. May use 1-2 weeks. Dispense 30 grams.  #1 x 0   Entered and Authorized by:   Angeline Slim MD   Signed by:   Angeline Slim MD on 01/07/2010   Method used:   Electronically to        [facility] (retail)  [address]       [place], [place]  [id number]       Ph: [phone] or [phone]       Fax: [phone]   RxID:   [id number]

## 2010-06-11 NOTE — Consult Note (Signed)
Summary: [facility]  [facility]   Imported By: Clydell Hakim 07/13/2009 11:39:35  _____________________________________________________________________  External Attachment:    Type:   Image     Comment:   External Document

## 2010-06-11 NOTE — Letter (Signed)
Summary: [employer]  [facility]  [address]   [place], [place] [address]   Phone: [phone]  Fax: [phone]    01/08/2010  [name] [address] [place], [place]  [address]  Dear Ms. Gueye,  Your A1C was 6.7.  Although it is at goal of <7, it is trending upwards.  Last A1C was 6.1.  I am concerned that with your weight gain and increasing A1C you are heading down a path of complications from longterm DM and obesity.  I would like to increase dose of Metformin from 500mg  daily to 500mg  two times a day.  I've sent a new prescription to your pharmacy.  Please start taking Metformin two times a day as soon as you receive this letter.  Please call me with any questions or concerns.     Sincerely,   Windel Keziah MD  Appended Document: [employer] mailed

## 2010-06-11 NOTE — Miscellaneous (Signed)
  Medications Added METFORMIN HCL 500 MG TABS (METFORMIN HCL) 1 tab by mouth every morning for diabetes       Clinical Lists Changes  Medications: Changed medication from METFORMIN HCL 500 MG TABS (METFORMIN HCL) 1 tab by mouth bid for diabetes to METFORMIN HCL 500 MG TABS (METFORMIN HCL) 1 tab by mouth every morning for diabetes - Signed Rx of METFORMIN HCL 500 MG TABS (METFORMIN HCL) 1 tab by mouth every morning for diabetes;  #30 x 3;  Signed;  Entered by: Angeline Slim MD;  Authorized by: Angeline Slim MD;  Method used: Electronically to [facility] [address] [place], [place]  [id number], Ph: [phone] or [phone], Fax: [phone] Observations: Added new observation of PRIMARY MD: Joh Rao MD (01/17/2010 17:41)    Prescriptions: METFORMIN HCL 500 MG TABS (METFORMIN HCL) 1 tab by mouth every morning for diabetes  #30 x 3   Entered and Authorized by:   Angeline Slim MD   Signed by:   Angeline Slim MD on 01/17/2010   Method used:   Electronically to        [facility] (retail)       [address]       [place], [place]  [id number]       Ph: [phone] or [phone]       Fax: [phone]   RxID:   [id number]

## 2010-06-11 NOTE — Miscellaneous (Signed)
   Clinical Lists Changes  Problems: Added new problem of CHRONIC KIDNEY DISEASE STAGE III (MODERATE) (ICD-585.3) Removed problem of RENAL INSUFFICIENCY, CHRONIC (ICD-586) Added new problem of DIABETES MELLITUS, WITH RENAL COMPLICATIONS (ICD-250.40)

## 2010-06-11 NOTE — Assessment & Plan Note (Signed)
Summary: CPE/KH   Vital Signs:  Patient profile:   35 year old female Height:      66 inches Weight:      186 pounds BMI:     30.13 Pulse rate:   88 / minute BP sitting:   120 / 70  (left arm) Cuff size:   regular  Vitals Entered By: Renato Battles slade,cma CC: physical. discuss Metformin  Is Patient Diabetic? Yes Pain Assessment Patient in pain? no        Primary Care Provider:  CAT TA MD  CC:  physical. discuss Metformin .  History of Present Illness: 35 y/o F with schizophrenia, MR,   OBESITY BMI: 30.13 (down from 30.2)  Weight difference since last OV: -0.6 lbs Exercise: has been walking more and spending less time in bed         How often: since signing contract to exercise on 01/17/10 she has just started walking on Wed (2 days ago)       Diet: drinking skim milk.  no more sodas or juice or gatorade.  drinking water with crystal light.  making [place] burgers and salmon cake (bakeds)  DIABETES Meds: metformin 500mg  daily.  Pt has CRI with Cr 1.6-1.9 Compliance :yes   Diet: improving, cutting down on juice, gatorade, junk foods  Lightheadedness:no    Dizziness: no    Confusion : no   Shakiness :no   Abd  pain:no   Nausea:no    Vomiting :no Excersie: pt just started "exercising" by walking and avoiding sedantary lifestyle     Renal Insuff: Last check cr 1.6, although w/n baseline, it is still elevated from the Cr in previous labs.       Habits & Providers  Alcohol-Tobacco-Diet     Alcohol drinks/day: 0     Tobacco Status: never     Tobacco Counseling: not indicated; no tobacco use     Diet Comments: Just started walking     Diet Counseling: to improve diet; diet is suboptimal  Exercise-Depression-Behavior     Does Patient Exercise: yes     Exercise Counseling: to improve exercise regimen     Type of exercise: walking     Exercise (avg: min/session): <30     Times/week: <3  Current Medications (verified): 1)  Bayer Childrens Aspirin 81 Mg Chew (Aspirin)  .... Take 1 Tablet By Mouth Once A Day 2)  Clindagel 1 % Gel (Clindamycin Phosphate) .... Apply A Small Amount To Affected Area Twice A Day 3)  Famotidine 20 Mg Tabs (Famotidine) .... Take 1 Tablet By Mouth Twice A Day 4)  Ascensia Elite Test  Strp (Glucose Blood) .... Check Fasting Blood Sugar Daily in Am> 5)  Clindagel 1 % Gel (Clindamycin Phosphate) .... Apply To Affected Area Once Daily 6)  Prodigy Autocode Blood Glucose  Devi (Blood Glucose Monitoring Suppl) .... Use As Directed 7)  Prodigy Blood Glucose Test  Strp (Glucose Blood) .... Use As Directed 8)  Prodigy Twist Top Lancets 28g  Misc (Lancets) .... Use As Directed 9)  Miralax   Powd (Polyethylene Glycol 3350) .Marland Kitchen.. 17g By Mouth Once Daily As Needed Constipation 10)  Mylanta Gas 125 Mg Chew (Simethicone) .... One Tablet By Mouth Daily As Needed Gas 11)  Benztropine Mesylate 1 Mg Tabs (Benztropine Mesylate) .... Take 1 By Mouth Two Times A Day 12)  Divalproex Sodium 500 Mg Tbec (Divalproex Sodium) .... Take 1 By Mouth Two Times A Day 13)  Paroxetine Hcl 30 Mg Tabs (Paroxetine  Hcl) .... Take 2 By Mouth At Bedtime 14)  Fazaclo 100 Mg Tbdp (Clozapine) .... Dissolve 4 Tablets in Mouth At Bedtime 15)  Metoprolol Succinate 50 Mg Xr24h-Tab (Metoprolol Succinate) .... One By Mouth Once Daily 16)  Differin 0.1 % Gel (Adapalene) .... Apply To Skin At Bedtime 17)  Depo-Provera 150 Mg/ml Susp (Medroxyprogesterone Acetate) .... Gets From Whog 18)  Metformin Hcl 500 Mg Tabs (Metformin Hcl) .Marland Kitchen.. 1 Tab By Mouth Every Morning For Diabetes 19)  Hydrocortisone 1 % Crea (Hydrocortisone) .... Apply To Afftected Areas Two Times A Day As Needed Itchiness. May Use 1-2 Weeks. Dispense 30 Grams. 20)  Colace 100 Mg Caps (Docusate Sodium) .Marland Kitchen.. 1 Tab By Mouth Daily As Needed Constipation 21)  Glucotrol 5 Mg Tabs (Glipizide) .Marland Kitchen.. 1 Tab By Mouth 30 Minutes Before Breakfast and 30 Minutes Before Dinner  Allergies (verified): 1)  ! 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  Lives in [place] [facility] house in [place]. [phone], cell [phone] [phone].  Previously lived in group home (yrs ago). Twin sister with similar problem list. Has 24hr care.  no tob/ETOH/Drugs/sexual activity.  Case manager:  Ewell Poe [phone]  Caregiver works for:  [employer]  [address], [place], [place] [address].  Rudean Haskell, Clinical Supervisor [phone]  Guardian:  Kristin Bruins of [employer] [place] [facility] [place].  [phone] office.  cell [phone]  Risk Factors: Alcohol Use: 0 (02/01/2010) Diet: Just started walking (02/01/2010) Exercise: yes (02/01/2010)  Risk Factors: Smoking Status: never (02/01/2010)  Social History: Does Patient Exercise:  yes  Review of Systems       per hpi   Physical Exam  General:  Well-developed,well-nourished,in no acute distress; alert,appropriate and cooperative throughout examination. vitals reviewed.  Head:  normocephalic and atraumatic.   Eyes:  No corneal or conjunctival inflammation noted. EOMI. Perrla. Funduscopic exam benign, without hemorrhages, exudates or papilledema. Vision grossly normal. Nose:  External  nasal examination shows no deformity or inflammation. Nasal mucosa are pink and moist without lesions or exudates. Mouth:  Oral mucosa and oropharynx without lesions or exudates.  Teeth in good repair. Neck:  No deformities, masses, or tenderness noted. Lungs:  Normal respiratory effort, chest expands symmetrically. Lungs are clear to auscultation, no crackles or wheezes. Heart:  Normal rate and regular rhythm. S1 and S2 normal without gallop, murmur, click, rub or other extra sounds. Abdomen:  Bowel sounds positive,abdomen soft and non-tender without masses, organomegaly or hernias noted. Msk:  No deformity or scoliosis noted of thoracic or lumbar spine.   Extremities:  No clubbing, cyanosis, edema, or deformity noted with normal full range of motion of all joints.   Neurologic:  alert & oriented X3 and cranial nerves II-XII intact.   Cervical Nodes:  No lymphadenopathy noted   Impression & Recommendations:  Problem # 1:  DIABETES MELLITUS, TYPE II, WITHOUT COMPLICATIONS (ICD-250.00) Assessment Unchanged Will add glipizide to improve diabetes management.  Will avoid increasing metformin at this time since pt has CRI.  (see below).  LDL at goal (LDL 82).  Her updated medication list for this problem includes:    Bayer Childrens Aspirin 81 Mg Chew (Aspirin) .Marland Kitchen... Take 1 tablet by mouth once a day    Metformin Hcl 500 Mg Tabs (Metformin hcl) .Marland Kitchen... 1 tab by mouth every morning for diabetes    Glucotrol 5 Mg Tabs (Glipizide) .Marland Kitchen... 1 tab by mouth 30 minutes before breakfast and 30 minutes before dinner  Orders: [facility]- Est  Level 4 (40981)  Problem # 2:  RENAL INSUFFICIENCY, CHRONIC (ICD-586) Assessment: Unchanged Last check Cr 1.6, which is within her baseline, but I am concerned because she has had improved renal function in past.  For this reason, I will not increase metformin.  Will monitor closely.   Orders: [facility]- Est  Level 4 ([address]147)  Problem # 3:  OVERWEIGHT (ICD-278.02) Assessment:  Improved This is the first time since 02/2009 that pt has not gained weight.  She has lost 0.6 lbs since last visit.  She may be more interested in losing weight now that she knows she lost weight today.  Will encourage this.  Orders: [facility]- Est  Level 4 ([address]56)  Complete Medication List: 1)  Bayer Childrens Aspirin 81 Mg Chew (Aspirin) .... Take 1 tablet by mouth once a day 2)  Clindagel 1 % Gel (Clindamycin phosphate) .... Apply a small amount to affected area twice a day 3)  Famotidine 20 Mg Tabs (Famotidine) .... Take 1 tablet by mouth twice a day 4)  Ascensia Elite Test Strp (Glucose blood) .... Check fasting blood sugar daily in am> 5)  Clindagel 1 % Gel (Clindamycin phosphate) .... Apply to affected area once daily 6)  Prodigy Autocode Blood Glucose Devi (Blood glucose monitoring suppl) .... Use as directed 7)  Prodigy Blood Glucose Test Strp (Glucose blood) .... Use as directed 8)  Prodigy Twist Top Lancets 28g Misc (Lancets) .... Use as directed 9)  Miralax Powd (Polyethylene glycol 3350) .Marland Kitchen.. 17g by mouth once daily as needed constipation 10)  Mylanta Gas 125 Mg Chew (Simethicone) .... One tablet by mouth daily as needed gas 11)  Benztropine Mesylate 1 Mg Tabs (Benztropine mesylate) .... Take 1 by mouth two times a day 12)  Divalproex Sodium 500 Mg Tbec (Divalproex sodium) .... Take 1 by mouth two times a day 13)  Paroxetine Hcl 30 Mg Tabs (Paroxetine hcl) .... Take 2 by mouth at bedtime 14)  Fazaclo 100 Mg Tbdp (Clozapine) .... Dissolve 4 tablets in mouth at bedtime 15)  Metoprolol Succinate 50 Mg Xr24h-tab (Metoprolol succinate) .... One by mouth once daily 16)  Differin 0.1 % Gel (Adapalene) .... Apply to skin at bedtime 17)  Depo-provera 150 Mg/ml Susp (Medroxyprogesterone acetate) .... Gets from whog 18)  Metformin Hcl 500 Mg Tabs (Metformin hcl) .Marland Kitchen.. 1 tab by mouth every morning for diabetes 19)  Hydrocortisone 1 % Crea (Hydrocortisone) .... Apply to afftected areas two times a  day as needed itchiness. may use 1-2 weeks. dispense 30 grams. 20)  Colace 100 Mg Caps (Docusate sodium) .Marland Kitchen.. 1 tab by mouth daily as needed constipation 21)  Glucotrol 5 Mg Tabs (Glipizide) .Marland Kitchen.. 1 tab by mouth 30 minutes before breakfast and 30 minutes before dinner  Patient Instructions: 1)  Please schedule a follow-up appointment in 3 months for diabetes. 2)  New  medicine: Glipizide 10mg  take 30 minutes before breakfast and 30 minutes before dinner 3)  Continue Metformin 500mg  each morning.   Prescriptions: GLUCOTROL 5 MG TABS (GLIPIZIDE) 1 tab by mouth 30 minutes before breakfast and 30 minutes before dinner  #60 x 6   Entered and Authorized by:   Angeline Slim MD   Signed by:   Angeline Slim MD on 02/01/2010   Method used:   Print then Give to Patient   RxID:   [id number] METFORMIN HCL 500 MG TABS (METFORMIN HCL) 1 tab by mouth every morning for diabetes  #30 x 6   Entered and Authorized by:   Angeline Slim MD   Signed by:   Angeline Slim MD on 02/01/2010   Method used:   Print then Give to Patient   RxID:   [id number]    Prevention & Chronic Care Immunizations   Influenza vaccine: Fluvax 3+  (02/26/2009)    Tetanus booster: 11/10/1998: Done.   Tetanus booster due: 11/09/2008    Pneumococcal vaccine: Not documented  Other Screening   Pap smear: NEGATIVE FOR INTRAEPITHELIAL LESIONS OR MALIGNANCY.  (06/05/2008)   Pap smear due: Not Indicated   Smoking status: never  (02/01/2010)  Diabetes Mellitus   HgbA1C: 6.7  (01/08/2010)   Hemoglobin A1C due: 07/11/2007    Eye exam: Not documented    Foot exam: yes  (09/09/2007)   High risk foot: Not documented   Foot care education: Not documented   Foot exam due: 10/27/2009    Urine microalbumin/creatinine ratio: Not documented   Urine microalbumin/cr due: 09/08/2008    Diabetes flowsheet reviewed?: Yes   Progress toward A1C goal: At goal  Lipids   Total Cholesterol: 141  (01/08/2010)   LDL: 82  (01/08/2010)   LDL Direct: Not  documented   HDL: 31  (01/08/2010)   Triglycerides: 138  (01/08/2010)  Self-Management Support :   Personal Goals (by the next clinic visit) :     Personal A1C goal: 7  (02/14/2009)     Personal blood pressure goal: 130/80  (02/14/2009)     Personal LDL goal: 100  (02/14/2009)    Diabetes self-management support: Written self-care plan  (06/13/2009)   Appended Document: CPE/KH

## 2010-06-11 NOTE — Assessment & Plan Note (Signed)
Summary: vag prob,df(resch from Dr. Mack Hook sched)/bmc   Vital Signs:  Patient profile:   35 year old female Height:      66 inches Weight:      182 pounds BMI:     29.48 Temp:     98.2 degrees F oral Pulse rate:   95 / minute BP sitting:   120 / 85  (right arm) Cuff size:   large  Vitals Entered By: Tessie Fass CMA (Sep 12, 2009 2:13 PM) CC: vaginal discharge and odor x 2 weeks Pain Assessment Patient in pain? no        Primary Care Provider:  CAT TA MD  CC:  vaginal discharge and odor x 2 weeks.  History of Present Illness:   2 weeks ago noted foul odor from vagina. Pt told her case manager that she had yellow discharge but denies any discharge today. Denies pruritis or burning sensation. Pt wipes from back to front still. Denies dysuria, abd pain.  Care Taker [name] present to give some history as pt is mentally challenged. She has noticed a foul odor for at least 3-4 weeks now. Pt bathes daily, uses dove soap, occasionally baby wipes in between. Pt denies any sexual activity. No menses   Current Medications (verified): 1)  Bayer Childrens Aspirin 81 Mg Chew (Aspirin) .... Take 1 Tablet By Mouth Once A Day 2)  Clindagel 1 % Gel (Clindamycin Phosphate) .... Apply A Small Amount To Affected Area Twice A Day 3)  Famotidine 20 Mg Tabs (Famotidine) .... Take 1 Tablet By Mouth Twice A Day 4)  Ascensia Elite Test  Strp (Glucose Blood) .... Check Fasting Blood Sugar Daily in Am> 5)  Clindagel 1 % Gel (Clindamycin Phosphate) .... Apply To Affected Area Once Daily 6)  Prodigy Autocode Blood Glucose  Devi (Blood Glucose Monitoring Suppl) .... Use As Directed 7)  Prodigy Blood Glucose Test  Strp (Glucose Blood) .... Use As Directed 8)  Prodigy Twist Top Lancets 28g  Misc (Lancets) .... Use As Directed 9)  Miralax   Powd (Polyethylene Glycol 3350) .Marland Kitchen.. 17g By Mouth Once Daily As Needed Constipation 10)  Mylanta Gas 125 Mg Chew (Simethicone) .... One Tablet By Mouth Daily As  Needed Gas 11)  Benztropine Mesylate 1 Mg Tabs (Benztropine Mesylate) .... Take 1 By Mouth Two Times A Day 12)  Divalproex Sodium 500 Mg Tbec (Divalproex Sodium) .... Take 1 By Mouth Two Times A Day 13)  Paroxetine Hcl 30 Mg Tabs (Paroxetine Hcl) .... Take 2 By Mouth At Bedtime 14)  Fazaclo 100 Mg Tbdp (Clozapine) .... Dissolve 4 Tablets in Mouth At Bedtime 15)  Metoprolol Succinate 50 Mg Xr24h-Tab (Metoprolol Succinate) .... 1/2 Tablet By Mouth Once Daily X 2 Weeks Then Increase To One By Mouth Once Daily 16)  Differin 0.1 % Crea (Adapalene) .... Apply To Skin At Bedtime 17)  Depo-Provera 150 Mg/ml Susp (Medroxyprogesterone Acetate) .... Gets From Whog 18)  Metformin Hcl 500 Mg Tabs (Metformin Hcl) .Marland Kitchen.. 1 Tab By Mouth Daily For High Sugar  Allergies: 1)  ! 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 vitals reviewed Abdomen:  soft, non-tender, no distention, and no masses.   Genitalia:  small introitus, no external lesions, small speculum passed without discomfot, half of cervix visuaziled. Wet prep done, GC/CHlamhydia, secondary to fear bimanual exam not done   Impression & Recommendations:  Problem # 1:  VAGINAL DISCHARGE (ICD-623.5) Assessment New No evidence of acute vaginitis. GC/Chlamydia less likely as not sexually active however with history of thick yellow discharge swab done. No meds today, discussed hygeine. Will allow one douche,  read instructions below. Discussed proper bathroom technique Orders: Wet Prep- [facility] [phone]) GC/Chlamydia-[facility] (87591/[id number]) [facility]- Est Level  3 (30865)  Complete Medication List: 1)  Bayer Childrens Aspirin 81 Mg Chew (Aspirin) .... Take 1 tablet by mouth once a day 2)  Clindagel 1 % Gel (Clindamycin phosphate) .... Apply a small amount to affected area twice a day 3)  Famotidine 20 Mg Tabs (Famotidine) .... Take 1 tablet by mouth twice a day 4)  Ascensia Elite Test Strp (Glucose blood) .... Check fasting blood sugar daily in am> 5)  Clindagel 1 % Gel (Clindamycin phosphate) .... Apply to affected area once daily 6)  Prodigy Autocode Blood Glucose Devi (Blood glucose monitoring suppl) .... Use as directed 7)  Prodigy Blood Glucose Test Strp (Glucose blood) .... Use as directed 8)  Prodigy Twist Top Lancets 28g Misc (Lancets) .... Use as directed 9)  Miralax Powd (Polyethylene glycol 3350) .Marland Kitchen.. 17g by mouth once daily as needed constipation 10)  Mylanta Gas 125 Mg Chew (Simethicone) .... One tablet by mouth daily as needed gas 11)  Benztropine Mesylate 1 Mg Tabs (Benztropine mesylate) .... Take 1 by mouth two times a day 12)  Divalproex Sodium 500 Mg Tbec (Divalproex sodium) .... Take 1 by mouth two times a day 13)  Paroxetine Hcl 30 Mg Tabs (Paroxetine hcl) .... Take 2 by mouth at bedtime 14)  Fazaclo 100 Mg Tbdp (Clozapine) .... Dissolve 4 tablets in mouth at bedtime 15)  Metoprolol Succinate 50 Mg Xr24h-tab (Metoprolol succinate) .... 1/2 tablet by mouth once daily x 2 weeks then increase to one by mouth once daily 16)  Differin 0.1 % Crea (Adapalene) .... Apply to skin at bedtime 17)  Depo-provera 150 Mg/ml Susp (Medroxyprogesterone acetate) .... Gets from whog 18)  Metformin Hcl 500 Mg Tabs (Metformin hcl) .Marland Kitchen.. 1 tab by mouth daily for high sugar  Patient Instructions: 1)  Your testing today was negative for yeast infection, bacterial vaginosis or Trichomonas. 2)   I want you to use 1  douche of vinegar water and eat plenty of yogurt 3)  Avoid fragranced soaps and body wash. 4)  I will call Mrs. McAdoo tomorrow with your other labs  Laboratory Results  Date/Time Received: Sep 12, 2009 2:37 PM  Date/Time Reported: Sep 12, 2009 2:44 PM   [employer] Source: vaginal WBC/hpf: 0-3 Bacteria/hpf: 1+ Clue cells/hpf: none  Negative whiff Yeast/hpf: none Trichomonas/hpf: none Comments: 1-5 RBC's present  ...........test performed by...........Marland KitchenTerese Door, CMA    Appended Document: vag prob,df(resch from Dr. Mack Hook sched)/bmc [name] phone [phone]

## 2010-06-11 NOTE — Progress Notes (Signed)
Summary: Change to DM med    Phone Note Outgoing Call   Call placed by: Angeline Slim MD,  April 18, 2010 10:21 AM Call placed to: Patient Summary of Call: Called caregiver Re change to DM management.  Left message: 1) Metformin 500mg  TWO  times a day for diabetes 2) Stop taking Glucotrol  3) Rx already sent to pharmacy. 4) I will also send letter to the house regarding this change.  Call me back with questions.   Initial call taken by: Cat Ta MD,  April 18, 2010 10:22 AM

## 2010-06-11 NOTE — Assessment & Plan Note (Signed)
Summary: f/u [facility]   Vital Signs:  Patient profile:   35 year old female Weight:      180 pounds BMI:     29.16 Temp:     98.5 degrees F oral Pulse rate:   90 / minute BP sitting:   109 / 70  (left arm) Cuff size:   regular  Vitals Entered By: Arlyss Repress CMA, (April 17, 2010 9:43 AM) CC: f/up DM and refill meds Is Patient Diabetic? Yes Pain Assessment Patient in pain? no        Primary Care Provider:  Tinita Brooker MD  CC:  f/up DM and refill meds.  History of Present Illness: 35 y/o F with MR, Schizophrenia, DM,   DIABETES Meds: metformin 500mg  daily, glucotrol 5mg  two times a day  Compliance : yes  Diet: eating more healty diet with [place], less junk foods, no more sodas/koolaid Lightheadedness:no  Dizziness:no    Confusion:no  Shakiness:no   Abd  pain:no   Nausea:no  Vomiting:no CBG 03/2010: 120s-140s CBGs 04/2010: 124-196   OBESITY BMI: 29. 16, last BMI 30.13  Weight difference since last OV: -6 lbs  Exercise:  not exercising much, but is not spending day time sleeping anymore       Diet: she does not like fruits.  She really likes chips, specifically Doritos and Cheetos.  She likes sodas.  After last visti, these "junk"  foods have been stopped from her diet.  Pt asked today if she can have baked apples with cinnamon.  She also asked about Baked Chips and Cheetos.     Habits & Providers  Alcohol-Tobacco-Diet     Tobacco Status: never  Current Medications (verified): 1)  Bayer Childrens Aspirin 81 Mg Chew (Aspirin) .... Take 1 Tablet By Mouth Once A Day 2)  Clindagel 1 % Gel (Clindamycin Phosphate) .... Apply A Small Amount To Affected Area Twice A Day 3)  Famotidine 20 Mg Tabs (Famotidine) .... Take 1 Tablet By Mouth Twice A Day 4)  Ascensia Elite Test  Strp (Glucose Blood) .... Check Fasting Blood Sugar Daily in Am> 5)  Clindagel 1 % Gel (Clindamycin Phosphate) .... Apply To Affected Area Once Daily 6)  Prodigy Autocode Blood Glucose  Devi (Blood Glucose  Monitoring Suppl) .... Use As Directed 7)  Prodigy Blood Glucose Test  Strp (Glucose Blood) .... Use As Directed 8)  Prodigy Twist Top Lancets 28g  Misc (Lancets) .... Use As Directed 9)  Miralax   Powd (Polyethylene Glycol 3350) .Marland Kitchen.. 17g By Mouth Once Daily As Needed Constipation 10)  Mylanta Gas 125 Mg Chew (Simethicone) .... One Tablet By Mouth Daily As Needed Gas 11)  Benztropine Mesylate 1 Mg Tabs (Benztropine Mesylate) .... Take 1 By Mouth Two Times A Day 12)  Divalproex Sodium 500 Mg Tbec (Divalproex Sodium) .... Take 1 By Mouth Two Times A Day 13)  Paroxetine Hcl 30 Mg Tabs (Paroxetine Hcl) .... Take 2 By Mouth At Bedtime 14)  Fazaclo 100 Mg Tbdp (Clozapine) .... Dissolve 4 Tablets in Mouth At Bedtime 15)  Metoprolol Succinate 50 Mg Xr24h-Tab (Metoprolol Succinate) .... One By Mouth Once Daily 16)  Differin 0.1 % Gel (Adapalene) .... Apply To Skin At Bedtime 17)  Depo-Provera 150 Mg/ml Susp (Medroxyprogesterone Acetate) .... Gets From Whog 18)  Metformin Hcl 500 Mg Tabs (Metformin Hcl) .Marland Kitchen.. 1 Tab By Mouth Every Morning For Diabetes 19)  Hydrocortisone 1 % Crea (Hydrocortisone) .... Apply To Afftected Areas Two Times A Day As Needed Itchiness.  May Use 1-2 Weeks. Dispense 30 Grams. 20)  Colace 100 Mg Caps (Docusate Sodium) .Marland Kitchen.. 1 Tab By Mouth Daily As Needed Constipation 21)  Glucotrol 5 Mg Tabs (Glipizide) .Marland Kitchen.. 1 Tab By Mouth 30 Minutes Before Breakfast and 30 Minutes Before Dinner  Allergies (verified): 1)  ! * Minocycline  Physical Exam  General:  Well-developed,well-nourished,in no acute distress; alert,appropriate and cooperative throughout examination Lungs:  Normal respiratory effort, chest expands symmetrically. Lungs are clear to auscultation, no crackles or wheezes. Heart:  Normal rate and regular rhythm. S1 and S2 normal without gallop, murmur, click, rub or other extra sounds. no bruit.   Abdomen:  Bowel sounds positive,abdomen soft and non-tender without masses,  organomegaly or hernias noted. 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  The difference is the addition of Glucotrol 5mg  two times a day.  I will keep this regimen as she did not have any hypocglycemic events in her log.  Since she is taking Metformin only once a day, I will likely increase that to two times a day and d/c glucotrol or decrease it to 2.5mg  two times a day.  I will likely stop glucotrol and increase metformin to two times a day.    The following medications were removed from the medication list:    Glucotrol 5 Mg Tabs (Glipizide) .Marland Kitchen... 1 tab by mouth 30 minutes before breakfast and 30 minutes before dinner Her updated medication list for this problem includes:    Bayer Childrens Aspirin 81 Mg Chew (Aspirin) .Marland Kitchen... Take 1 tablet by mouth once a day    Metformin Hcl 500 Mg Tabs (Metformin hcl) .Marland Kitchen... 1 tab by mouth twice a day  for diabetes  Orders: A1C-[facility] (04540) [facility]- Est Level  3 (98119)  Problem # 2:  OVERWEIGHT (ICD-278.02) Assessment: Improved Improved.  She has lost 6lbs since last visit.  BMI 29.1 vs 30.1.  We discussed for options of food control.  Since pt is asking for snacks and I do not want her to feel deprived, I have given her a choice of one bag of Cheetos per month vs 2 bags of Baked Chips.  She can also have baked apples sprinkled with cinnamon (no sugar).  Her caregiver, Ms Roddie Mc will supervise her cooking this.  Pt can also have one Snapple drink per month, otherwise will continue avoiding Koolaid and sodas.    Orders: [facility]- Est Level  3 (14782)  Complete Medication List: 1)  Bayer Childrens Aspirin 81 Mg Chew (Aspirin) .... Take 1 tablet by mouth once a day 2)  Clindagel 1 % Gel (Clindamycin phosphate) .... Apply a small amount to affected area twice a day 3)  Famotidine 20 Mg Tabs  (Famotidine) .... Take 1 tablet by mouth twice a day 4)  Ascensia Elite Test Strp (Glucose blood) .... Check fasting blood sugar daily in am> 5)  Clindagel 1 % Gel (Clindamycin phosphate) .... Apply to affected area once daily 6)  Prodigy Autocode Blood Glucose Devi (Blood glucose monitoring suppl) .... Use as directed 7)  Prodigy Blood Glucose Test Strp (Glucose blood) .... Use as directed 8)  Prodigy Twist Top Lancets 28g Misc (Lancets) .... Use as directed 9)  Miralax Powd (Polyethylene glycol 3350) .Marland Kitchen.. 17g by mouth once daily as needed constipation 10)  Mylanta Gas 125 Mg Chew (Simethicone) .... One tablet  by mouth daily as needed gas 11)  Benztropine Mesylate 1 Mg Tabs (Benztropine mesylate) .... Take 1 by mouth two times a day 12)  Divalproex Sodium 500 Mg Tbec (Divalproex sodium) .... Take 1 by mouth two times a day 13)  Paroxetine Hcl 30 Mg Tabs (Paroxetine hcl) .... Take 2 by mouth at bedtime 14)  Fazaclo 100 Mg Tbdp (Clozapine) .... Dissolve 4 tablets in mouth at bedtime 15)  Metoprolol Succinate 50 Mg Xr24h-tab (Metoprolol succinate) .... One by mouth once daily 16)  Differin 0.1 % Gel (Adapalene) .... Apply to skin at bedtime 17)  Depo-provera 150 Mg/ml Susp (Medroxyprogesterone acetate) .... Gets from whog 18)  Metformin Hcl 500 Mg Tabs (Metformin hcl) .Marland Kitchen.. 1 tab by mouth twice a day  for diabetes 19)  Hydrocortisone 1 % Crea (Hydrocortisone) .... Apply to afftected areas two times a day as needed itchiness. may use 1-2 weeks. dispense 30 grams. 20)  Colace 100 Mg Caps (Docusate sodium) .Marland Kitchen.. 1 tab by mouth daily as needed constipation  Patient Instructions: 1)  Please schedule a follow-up appointment in 3 months for DM, Weight. 2)  Exercise: during TV commericial use the dumbbells and also do leg lifts like we discussed in clinic. 3)  You lost 6 lbs!!! Keep up the good work!!! 4)  You can have 2 baked chips or 1 cheetos per month. 5)  You can have baked apples with cinnamon for  snacks.  6)  No sodas, koolaid, gatorade.  You can drink Crystal light in water. 7)  Eat more vegetables,  [place] and fish. 8)  You can make diet Snapple once a month.  Prescriptions: METFORMIN HCL 500 MG TABS (METFORMIN HCL) 1 tab by mouth twice a day  for diabetes  #60 x 6   Entered and Authorized by:   Angeline Slim MD   Signed by:   Angeline Slim MD on 04/17/2010   Method used:   Electronically to        [facility] (retail)       [address]       [place], [place]  [address]       Ph: [phone] or [phone]       Fax: [phone]   RxID:   [id number] CLINDAGEL 1 % GEL (CLINDAMYCIN PHOSPHATE) Apply a small amount to affected area twice a day  #1 x 3   Entered and Authorized by:   Angeline Slim MD   Signed by:   Angeline Slim MD on 04/17/2010   Method used:   Electronically to        [facility] (retail)       [address]       [place], [place]  [address]       Ph: [phone] or [phone]       Fax: [phone]   RxID:   [id number]    Orders Added: 1)  A1C-[facility] [83036] 2)  [facility]- Est Level  3 [99213]    Laboratory Results   Blood Tests   Date/Time Received: April 17, 2010 9:57 AM  Date/Time Reported: April 17, 2010 10:15 AM   HGBA1C: 5.7%   (Normal Range: Non-Diabetic - 3-6%   Control Diabetic - 6-8%)  Comments: ........test performed by.....Marland KitchenMarland KitchenArlyss Repress, CMA .............entered by...........Marland KitchenBonnie A. [place], MLS (ASCP)cm

## 2010-06-11 NOTE — Assessment & Plan Note (Signed)
Summary: 3 MONTH/DMP  Medications Added DIFFERIN 0.1 % CREA (ADAPALENE) apply to skin at bedtime      Allergies Added: ! * MINOCYCLINE  Primary Provider:  CAT TA MD  CC:  3 month rov.  History of Present Illness: Ms. Covell returns today for followup.  She is a patient of Dr. Nelida Meuse who has a h/o tachypalpitations without documented arrythmia.  She also has an abnormal ECG with a short PR interval despite an absence of QRS pre-excitation. When I saw her several months ago, I suspected she had sinus tachycardia as her underlying problem, perhaps related to some orthostasis.  I had started her on a beta blocker and she returns for followup.  She remains fairly sedentary.  No other complaints.  Her palpitations have improved.  Current Medications (verified): 1)  Bayer Childrens Aspirin 81 Mg Chew (Aspirin) .... Take 1 Tablet By Mouth Once A Day 2)  Clindagel 1 % Gel (Clindamycin Phosphate) .... Apply A Small Amount To Affected Area Twice A Day 3)  Famotidine 20 Mg Tabs (Famotidine) .... Take 1 Tablet By Mouth Twice A Day 4)  Ascensia Elite Test  Strp (Glucose Blood) .... Check Fasting Blood Sugar Daily in Am> 5)  Clindagel 1 % Gel (Clindamycin Phosphate) .... Apply To Affected Area Once Daily 6)  Prodigy Autocode Blood Glucose  Devi (Blood Glucose Monitoring Suppl) .... Use As Directed 7)  Prodigy Blood Glucose Test  Strp (Glucose Blood) .... Use As Directed 8)  Prodigy Twist Top Lancets 28g  Misc (Lancets) .... Use As Directed 9)  Miralax   Powd (Polyethylene Glycol 3350) .Marland Kitchen.. 17g By Mouth Once Daily As Needed Constipation 10)  Mylanta Gas 125 Mg Chew (Simethicone) .... One Tablet By Mouth Daily As Needed Gas 11)  Benztropine Mesylate 1 Mg Tabs (Benztropine Mesylate) .... Take 1 By Mouth Two Times A Day 12)  Divalproex Sodium 500 Mg Tbec (Divalproex Sodium) .... Take 1 By Mouth Two Times A Day 13)  Paroxetine Hcl 30 Mg Tabs (Paroxetine Hcl) .... Take 2 By Mouth At Bedtime 14)  Fazaclo  100 Mg Tbdp (Clozapine) .... Dissolve 4 Tablets in Mouth At Bedtime 15)  Metoprolol Succinate 50 Mg Xr24h-Tab (Metoprolol Succinate) .... 1/2 Tablet By Mouth Once Daily X 2 Weeks Then Increase To One By Mouth Once Daily 16)  Differin 0.1 % Crea (Adapalene) .... Apply To Skin At Bedtime  Allergies (verified): 1)  ! 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  Lives in Altheimer McAdoo's house in [place]. [phone].  Previously lived in group home (yrs ago). Twin sister with similar problem list. Has 24hr care. On 2,500 calorie diabetic diet ; no tob/ETOH/Drugs/sexual activity.  Case manager:  Ewell Poe [phone]  Caregiver works for:  [employer]  [address], [place], [place] [address].  Karin Golden, Clinical Supervisor [phone], cell [phone]  Guardian:  Kristin Bruins of [employer]  of [employer] [place].  [phone] office.  cell [phone]  Review of Systems  The patient denies chest pain, syncope, dyspnea on exertion, and peripheral edema.    Vital Signs:  Patient profile:   35 year old female Height:      66 inches Weight:      174 pounds BMI:     28.19 Pulse rate:   96 / minute Pulse rhythm:   regular BP sitting:   99 / 67  (right arm) Cuff size:    large  Vitals Entered By: Judithe Modest CMA (June 06, 2009 9:38 AM)  Physical Exam  General:  Adult MR in no distress, accompanied by care giver from group home. Head:  normocephalic, atraumatic, no abnormalities observed, and no abnormalities palpated.   Mouth:  MMM Neck:  No deformities, masses, or tenderness noted. Lungs:  Normal respiratory effort, chest expands symmetrically. Lungs are clear to auscultation, no crackles or wheezes. Heart:  Normal rate and regular rhythm. S1 and S2 normal without gallop, murmur, click, rub or other extra sounds. Abdomen:  soft, non-tender, and normal bowel sounds.   Msk:  No deformity or scoliosis noted of thoracic or lumbar spine.   Pulses:  R radial normal and L radial normal.   Extremities:  no edema   EKG  Procedure date:  06/06/2009  Findings:      Normal sinus rhythm with rate of:  96.  Impression & Recommendations:  Problem # 1:  TACHYCARDIA, HX OF (ICD-V12.50) Her symptoms are improved.  She will continue her current meds. I have encouraged regular exercise. Orders: EKG w/ Interpretation (93000)  Problem # 2:  WEIGHT GAIN (ICD-783.1) I have encouraged her to exercise by walking or climbing stairs.  Patient Instructions: 1)  Your physician recommends that you schedule a follow-up appointment in: 6 MONTHS

## 2010-06-11 NOTE — Assessment & Plan Note (Signed)
Summary: f/u,df   Vital Signs:  Patient profile:   35 year old female Weight:      172.8 pounds BMI:     27.99 Temp:     96.5 degrees F Pulse rate:   95 / minute BP sitting:   105 / 81  Vitals Entered By: Starleen Blue RN (June 13, 2009 11:56 AM) CC: f/u Is Patient Diabetic? Yes Pain Assessment Patient in pain? no        Primary Care Provider:  Chauntae Hults MD  CC:  f/u.  History of Present Illness: cc: f/u constipation, weight gain  35 y/o F with   Constipation: daily bm, soft stools, no straining. Taking miralax daily as needed to have one soft stool per day.  No abdominal pain.  Some gaseous feelings, but pt has been refusing Mylanta, per caregiver.    Hyperglycemia:  Glucose check at home. fasting CBGs 101-234 for Jan.  Dec 112-211.    Weight gain:  40lbs since 08/2008.  pt states that she does not like to exercise.  she would rather sleep.  From previous visit we discussed walking per day, but pt states she is not doing this.    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 Take 1 Tablet By Mouth Once A Day 2)  Clindagel 1 % Gel (Clindamycin Phosphate) .... Apply A Small Amount To Affected Area Twice A Day 3)  Famotidine 20 Mg Tabs (Famotidine) .... Take 1 Tablet By Mouth Twice A Day 4)  Ascensia Elite Test  Strp (Glucose Blood) .... Check Fasting Blood Sugar Daily in Am> 5)  Clindagel 1 % Gel (Clindamycin Phosphate) .... Apply To Affected Area Once Daily 6)  Prodigy Autocode Blood Glucose  Devi (Blood Glucose Monitoring Suppl) .... Use As Directed 7)  Prodigy Blood  Glucose Test  Strp (Glucose Blood) .... Use As Directed 8)  Prodigy Twist Top Lancets 28g  Misc (Lancets) .... Use As Directed 9)  Miralax   Powd (Polyethylene Glycol 3350) .Marland Kitchen.. 17g By Mouth Once Daily As Needed Constipation 10)  Mylanta Gas 125 Mg Chew (Simethicone) .... One Tablet By Mouth Daily As Needed Gas 11)  Benztropine Mesylate 1 Mg Tabs (Benztropine Mesylate) .... Take 1 By Mouth Two Times A Day 12)  Divalproex Sodium 500 Mg Tbec (Divalproex Sodium) .... Take 1 By Mouth Two Times A Day 13)  Paroxetine Hcl 30 Mg Tabs (Paroxetine Hcl) .... Take 2 By Mouth At Bedtime 14)  Fazaclo 100 Mg Tbdp (Clozapine) .... Dissolve 4 Tablets in Mouth At Bedtime 15)  Metoprolol Succinate 50 Mg Xr24h-Tab (Metoprolol Succinate) .... 1/2 Tablet By Mouth Once Daily X 2 Weeks Then Increase To One By Mouth Once Daily 16)  Differin 0.1 % Crea (Adapalene) .... Apply To Skin At Bedtime 17)  Depo-Provera 150 Mg/ml Susp (Medroxyprogesterone Acetate) .... Gets From [facility]og 18)  Metformin Hcl 500 Mg Tabs (Metformin Hcl) .Marland Kitchen.. 1 Tab By Mouth Daily For High Sugar  Allergies (verified): 1)  ! 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  Lives in Omena McAdoo's house in [place]. [phone], cell [phone].  Previously lived in group home (yrs ago). Twin sister with similar problem list. Has 24hr care.  no tob/ETOH/Drugs/sexual activity.  Case manager:   Ewell Poe [phone]  Caregiver works for:  [employer]  [address], [address], [place], [place] [address].  Karin Golden, Clinical Supervisor [phone], cell [phone]  Guardian:  Kristin Bruins of [employer] [facility] [place] [facility] [place].  [phone] office.  cell [phone]  Physical Exam  General:  Well-developed,well-nourished,in no acute distress; alert,appropriate and cooperative throughout examination. vitals reviewed Head:  normocephalic and atraumatic.   Neck:  supple and full ROM.   Lungs:  Normal respiratory effort, chest expands symmetrically. Lungs are clear to auscultation, no crackles or wheezes. Heart:  Normal rate and regular rhythm. S1 and S2 normal without gallop, murmur, click, rub or other extra sounds. Abdomen:  Bowel sounds positive,abdomen soft and non-tender without masses, organomegaly or hernias noted. Msk:  No deformity or scoliosis noted of thoracic or lumbar spine.   Pulses:  R and L carotid,radial,,dorsalis pedisl pulses are full and equal bilaterally Extremities:  No clubbing, cyanosis, edema, or deformity noted with normal full range of motion of all joints.   Cervical Nodes:  No lymphadenopathy noted   Impression & Recommendations:  Problem # 1:  HYPERGLYCEMIA (ICD-790.29) Assessment Deteriorated A1C 6.1 today, which is similar to previous in 12/2008.  Previous A1C 5.1 to 5.2.  Is this secondary to benztropine?  Pt was seen at [facility] on 05/09/09.  Per their report pt wants to stay in bed most days and does not want to exercise.  Pt reported same here today.  Pt was seen by Dr Lewayne Bunting who recommended increase exercise.  Pt seems very resistant today.  I think she would benefit from starting low dose metformin 500mg  by mouth daily.     Orders: A1C-[facility] ([id number])Future Orders: Comp Met-[facility] ([id number]) ... 06/28/2010  Her updated medication list for this problem includes:    Metformin Hcl 500 Mg Tabs  (Metformin hcl) .Marland Kitchen... 1 tab by mouth daily for high sugar  Problem # 2:  CONSTIPATION, CHRONIC (ICD-564.09) Assessment: Improved Pt having soft daily bm.  Will continue this regimen. Her updated medication list for this problem includes:    Miralax Powd (Polyethylene glycol 3350) .Marland KitchenMarland KitchenMarland KitchenMarland Kitchen 17g by mouth once daily as needed constipation  Orders: [facility]- Est  Level 4 ([address]214) A1C-[facility] (38756)  Problem # 3:  WEIGHT GAIN (ICD-783.1) Assessment: Deteriorated Weight gain 2/2 benztropine?  Pt has been on this medication for a long time.  Caregiver is careful with her foods.  They have been to Nutrition classes.  Pt not eating the vegetales that come with her foods.  Caregiver is portioning foods per instructed by nutritionist.  Pt not exercising as we discussed in past.  I have again tried to encourage this.  Will continue to monitor. Orders: [facility]- Est  Level 4 ([address]329)  Complete Medication List: 1)  Bayer Childrens Aspirin 81 Mg Chew (Aspirin) .... Take 1 tablet by mouth once  a day 2)  Clindagel 1 % Gel (Clindamycin phosphate) .... Apply a small amount to affected area twice a day 3)  Famotidine 20 Mg Tabs (Famotidine) .... Take 1 tablet by mouth twice a day 4)  Ascensia Elite Test Strp (Glucose blood) .... Check fasting blood sugar daily in am> 5)  Clindagel 1 % Gel (Clindamycin phosphate) .... Apply to affected area once daily 6)  Prodigy Autocode Blood Glucose Devi (Blood glucose monitoring suppl) .... Use as directed 7)  Prodigy Blood Glucose Test Strp (Glucose blood) .... Use as directed 8)  Prodigy Twist Top Lancets 28g Misc (Lancets) .... Use as directed 9)  Miralax Powd (Polyethylene glycol 3350) .Marland Kitchen.. 17g by mouth once daily as needed constipation 10)  Mylanta Gas 125 Mg Chew (Simethicone) .... One tablet by mouth daily as needed gas 11)  Benztropine Mesylate 1 Mg Tabs (Benztropine mesylate) .... Take 1 by mouth two times a day 12)  Divalproex Sodium 500 Mg Tbec (Divalproex sodium) .... Take 1  by mouth two times a day 13)  Paroxetine Hcl 30 Mg Tabs (Paroxetine hcl) .... Take 2 by mouth at bedtime 14)  Fazaclo 100 Mg Tbdp (Clozapine) .... Dissolve 4 tablets in mouth at bedtime 15)  Metoprolol Succinate 50 Mg Xr24h-tab (Metoprolol succinate) .... 1/2 tablet by mouth once daily x 2 weeks then increase to one by mouth once daily 16)  Differin 0.1 % Crea (Adapalene) .... Apply to skin at bedtime 17)  Depo-provera 150 Mg/ml Susp (Medroxyprogesterone acetate) .... Gets from whog 18)  Metformin Hcl 500 Mg Tabs (Metformin hcl) .Marland Kitchen.. 1 tab by mouth daily for high sugar  Other Orders: Future Orders: Lipid-[facility] ([id number]) ... 06/14/2010 CBC-[facility] ([id number]) ... 06/28/2010  Patient Instructions: 1)  Please schedule a follow-up appointment in 4 months.  2)  I will call you regarding Shaneen's diabetes test.  She may need medications for this. 3)  Please make appt for cholesterol check.  This has to be fasting, so no food or drink after midnight the night before appointment. 4)  It is important that you exercise reguarly at least 30 minutes 5 times a week. If you develop chest pain, have severe difficulty breathing, or feel very tired, stop exercising immediately and seek medical attention.  5)  You need to lose weight. Please eat more vegetables and fruits and less sweets and junk foods. Prescriptions: METFORMIN HCL 500 MG TABS (METFORMIN HCL) 1 tab by mouth daily for high sugar  #34 x 6   Entered and Authorized by:   Angeline Slim MD   Signed by:   Angeline Slim MD on 06/13/2009   Method used:   Electronically to        [facility] (retail)       [address]       [place], [place]  [address]       Ph: [phone] or [phone]       Fax: [phone]   RxID:   [id number]    Prevention & Chronic Care Immunizations   Influenza vaccine: Fluvax 3+  (02/26/2009)    Tetanus booster: 11/10/1998: Done.   Tetanus booster due: 11/09/2008    Pneumococcal vaccine: Not documented  Other  Screening   Pap smear: NEGATIVE FOR INTRAEPITHELIAL LESIONS OR MALIGNANCY.  (06/05/2008)   Pap smear due: Not Indicated   Smoking status: never  (03/05/2009)  Lipids   Total Cholesterol: 133  (04/18/2008)   LDL: 76  (04/18/2008)   LDL Direct: Not documented  HDL: 41  (04/18/2008)   Triglycerides: 80  (04/18/2008)  Laboratory Results   Blood Tests   Date/Time Received: June 13, 2009 12:28 PM  Date/Time Reported: June 13, 2009 12:38 PM   HGBA1C: 6.1%   (Normal Range: Non-Diabetic - 3-6%   Control Diabetic - 6-8%)  Comments: ...............test performed by......Marland KitchenBonnie A. Swazil[place], MLS (ASCP)cm

## 2010-06-11 NOTE — Letter (Signed)
Summary: Lipid Letter  [facility]  [address]   [place], [place] [address]   Phone: [phone]  Fax: [phone]    01/09/2010  [name] [address] [place], [place]  [address]  Dear [name]:  We have carefully reviewed your last lipid profile from 01/08/2010 and the results are noted below with a summary of recommendations for lipid management.    Cholesterol:       141     Goal: <  200   HDL "good" Cholesterol:   31     Goal: >  40   LDL "bad" Cholesterol:   82     Goal: <  100   Triglycerides:       138     Goal: <  150    The cholesterol panel shows that your LDL "bad" cholesterol is low like it should be.  We will not need to start on a cholesterol medicine.  It also shows that your HDL "good" cholesterol is low.  This may increase with exercise.      TLC Diet (Therapeutic Lifestyle Change): ***Reduce Saturated Fats Protein should be approximately 15% of total calories ***Increased fiber may help lower LDL ***A higher intake of unsaturated fat may reduce Triglycerides and Increase HDL    Adjunctive Measures (may lower LIPIDS and reduce risk of Heart Attack) include: Aerobic Exercise (20-30 minutes 3-4 times a week) Limit Alcohol Consumption Weight Reduction Dietary Fiber 20-30 grams a day by mouth     Complete Blood Count Normal, no anemia  Liver Function Normal  Electrolytes Normal  Kidney Function Worse than before.  We will need to discuss this at the next appointment.  I would like to see you in one month to repeat the kidney test.     If you have any questions, please call. We appreciate being able to work with you.   Sincerely,    [facility] Jinan Biggins MD  Appended Document: Lipid Letter mailed

## 2010-06-11 NOTE — Letter (Signed)
Summary: Generic Letter  [facility]  [address]   [place], [place] [address]   Phone: [phone]  Fax: [phone]    01/17/2010  DAVIONNA BLACKSHER [address] [place], [place]  [address]  Dear Ms. Gates,  Because of your diabetes and weight gain we should avoid foods that add empty calories.  You should avoid juice, Gatorade, sodas, Powerade, Lipton ice tea.  You should drink skim milk and water.  You can add the [place] [employer] packet to your water is desired.    For foods you should avoid snacks like chips,   Instead of beef or pork, try chicken and [place].  You can use ground [place] breast to make hamburgers.  Fish is also good for you.   I would like you to continue seeing Diabetic Nutrition Advisor.       Sincerely,   Solina Heron MD

## 2010-06-11 NOTE — Letter (Signed)
Summary: Change to DM med   [facility]  [address]   [place], [place] [address]   Phone: [phone]  Fax: [phone]    04/18/2010  [name] [address] [place], [place]  [address]  Dear [name],  This letter is regarding a change in Diabetes medicine for [name].  I would like her to take Metformin 500mg  by mouth two times a day.  She can STOP taking Glucotrol 5mg . I have already sent prescriptions to the pharmacy for the increase in Metformin.    Please call the [facility] with any questions.    Sincerely,   Cat Ta MD  Appended Document: Change to DM med  mailed

## 2010-06-11 NOTE — Progress Notes (Signed)
Summary: re: labs/ts   ---- Converted from flag ---- ---- 09/13/2009 8:07 AM, Milinda Antis MD wrote: Please call [name] whi is Ms. Komorowski caretaker and let her know the gonorrhea and chlamydia screen was negative. No medications are needed THanks ------------------------------  called and lmvm to call us back. Arlyss Repress CMA,  Sep 13, 2009 11:19 AM  caregiver notified. De Nurse  Sep 13, 2009 11:58 AM

## 2010-06-13 NOTE — Progress Notes (Signed)
Summary: Recall of medication   Phone Note Call from Patient Call back at [phone]   Caller: Marlene/caregiver Summary of Call: saw gasx was recalled, please advise if pt needs to discontinue or should she take something else? Initial call taken by: Knox Royalty,  May 21, 2010 10:15 AM  Follow-up for Phone Call        lm that I was sending this to her doctor & will call her with her response Follow-up by: Golden Circle RN,  May 21, 2010 10:20 AM  Additional Follow-up for Phone Call Additional follow up Details #1::        All Gas-X Prevention products with expiration dates of Dec. 20, 2013, or earlier have been recalled.  Recalled by manufacturer because of risks of other meds mixing with Gas-X.   So make sure that the med you have at home is dated to be expired after Apr 30, 2012.    Additional Follow-up by: Cat Ta MD,  May 21, 2010 12:22 PM    Additional Follow-up for Phone Call Additional follow up Details #2::    spoke with [facility]. her box of Concha Pyo is one of the recalled ones. told her to take back to pharmacy & see if they will give her a new box with exp date after 2013. she verbalized understanding. states pt rarely takes it Follow-up by: Golden Circle RN,  May 22, 2010 8:50 AM

## 2010-06-13 NOTE — Progress Notes (Signed)
   Phone Note Other Incoming Call back at [phone]   Caller: Marlene-Caregiver. Summary of Call: Calling regarding Clindatel 1% gel(Clindamydam  Phosphate) application twice daily.  Need to have a rx  written for facility to pickup and place in patients records.  She have currently now in records for med to be given once a day.  Since this has been changed need on file. Initial call taken by: Abundio Miu,  May 29, 2010 8:46 AM    Prescriptions: CLINDAGEL 1 % GEL (CLINDAMYCIN PHOSPHATE) Apply a small amount to affected area twice a day  #1 x 3   Entered and Authorized by:   Angeline Slim MD   Signed by:   Angeline Slim MD on 05/29/2010   Method used:   Print then Give to Patient   RxID:   [id number]  Called above number and left message that Rx will be at [facility] for [place] to come pick up. Dagan Heinz MD  May 29, 2010 1:49 PM

## 2010-06-17 ENCOUNTER — Encounter: Payer: Self-pay | Admitting: Family Medicine

## 2010-06-17 ENCOUNTER — Other Ambulatory Visit: Payer: Self-pay | Admitting: Family Medicine

## 2010-06-17 ENCOUNTER — Ambulatory Visit (INDEPENDENT_AMBULATORY_CARE_PROVIDER_SITE_OTHER): Payer: Medicaid Other | Admitting: Family Medicine

## 2010-06-17 ENCOUNTER — Ambulatory Visit
Admission: RE | Admit: 2010-06-17 | Discharge: 2010-06-17 | Disposition: A | Discharge status: Home / Self Care | Payer: Medicaid Other | Source: Ambulatory Visit | Attending: Family Medicine | Admitting: Family Medicine

## 2010-06-17 DIAGNOSIS — M25561 Pain in right knee: Secondary | ICD-10-CM

## 2010-06-17 DIAGNOSIS — M25569 Pain in unspecified knee: Secondary | ICD-10-CM

## 2010-06-17 LAB — CONVERTED CEMR LAB
CO2: 20 meq/L (ref 19–32)
Chloride: 111 meq/L (ref 96–112)
Glucose, Bld: 72 mg/dL (ref 70–99)
Sodium: 144 meq/L (ref 135–145)

## 2010-06-17 LAB — BASIC METABOLIC PANEL
Calcium: 9.4 mg/dL (ref 8.4–10.5)
Potassium: 4.5 mEq/L (ref 3.5–5.3)
Sodium: 144 mEq/L (ref 135–145)

## 2010-06-27 NOTE — Assessment & Plan Note (Signed)
Summary: rt knee pain   Vital Signs:  Patient profile:   35 year old female Weight:      177.4 pounds Pulse rate:   92 / minute BP sitting:   117 / 75  (left arm) Cuff size:   regular  Vitals Entered By: Arlyss Repress CMA, (June 17, 2010 11:06 AM) CC: rght knee pain x 06-04-10 Is Patient Diabetic? No Pain Assessment Patient in pain? yes     Location: right knee Intensity: 2 Onset of pain  06-04-10   Primary Care Provider:  Markell Sciascia MD  CC:  rght knee pain x 06-04-10.  History of Present Illness: 35 y/o F is brought here by her caregive, Ms Roddie Mc for R knee pain.  R knee pain since 06/04/10 Tried Tylenol, which did not help. She fell in Sept 2011. States that it feels better today.  No swelling.  Pt is able to walk today.  She was limping on 1/24, but since then it has gotten better.  ? locking.  Pain is located around patella.    DIABETES   CBGs: 05/12/10 - 06/11/10  100-180  ave is 140s  Habits & Providers  Alcohol-Tobacco-Diet     Tobacco Status: never  Current Medications (verified): 1)  Bayer Childrens Aspirin 81 Mg Chew (Aspirin) .... Take 1 Tablet By Mouth Once A Day 2)  Clindagel 1 % Gel (Clindamycin Phosphate) .... Apply A Small Amount To Affected Area Twice A Day 3)  Famotidine 20 Mg Tabs (Famotidine) .... Take 1 Tablet By Mouth Twice A Day 4)  Ascensia Elite Test  Strp (Glucose Blood) .... Check Fasting Blood Sugar Daily in Am> 5)  Prodigy Autocode Blood Glucose  Devi (Blood Glucose Monitoring Suppl) .... Use As Directed 6)  Prodigy Blood Glucose Test  Strp (Glucose Blood) .... Use As Directed 7)  Prodigy Twist Top Lancets 28g  Misc (Lancets) .... Use As Directed 8)  Miralax   Powd (Polyethylene Glycol 3350) .Marland Kitchen.. 17g By Mouth Once Daily As Needed Constipation 9)  Mylanta Gas 125 Mg Chew (Simethicone) .... One Tablet By Mouth Daily As Needed Gas 10)  Benztropine Mesylate 1 Mg Tabs (Benztropine Mesylate) .... Take 1 By Mouth Two Times A Day 11)  Divalproex  Sodium 500 Mg Tbec (Divalproex Sodium) .... Take 1 By Mouth Two Times A Day 12)  Paroxetine Hcl 30 Mg Tabs (Paroxetine Hcl) .... Take 2 By Mouth At Bedtime 13)  Fazaclo 100 Mg Tbdp (Clozapine) .... Dissolve 4 Tablets in Mouth At Bedtime 14)  Metoprolol Succinate 50 Mg Xr24h-Tab (Metoprolol Succinate) .... One By Mouth Once Daily 15)  Differin 0.1 % Gel (Adapalene) .... Apply To Skin At Bedtime 16)  Depo-Provera 150 Mg/ml Susp (Medroxyprogesterone Acetate) .... Gets From [facility] 17)  Metformin Hcl 500 Mg Tabs (Metformin Hcl) .Marland Kitchen.. 1 Tab By Mouth Twice A Day  For Diabetes 18)  Hydrocortisone 1 % Crea (Hydrocortisone) .... Apply To Afftected Areas Two Times A Day As Needed Itchiness. May Use 1-2 Weeks. Dispense 30 Grams. 19)  Colace 100 Mg Caps (Docusate Sodium) .Marland Kitchen.. 1 Tab By Mouth Daily As Needed Constipation  Allergies (verified): 1)  ! * Minocycline PMH-FH-SH reviewed for relevance  Review of Systems      See HPI General:  Denies chills, fever, and weakness. MS:  Complains of joint pain; denies joint redness, joint swelling, loss of strength, muscle weakness, and stiffness.  Physical Exam  General:  Well-developed,well-nourished,in no acute distress; alert,appropriate and cooperative throughout examination. Vitals reviewed.  Msk:  Knee: No erythema. Minor effusion. No obvious bony abnormalities. Palpation normal with no warmth or joint line tenderness or patellar tenderness or condyle tenderness. ROM normal in flexion and extension and lower leg rotation. Ligaments with solid consistent endpoints including ACL, PCL, LCL, MCL. Negative Mcmurray's and provocative meniscal tests. Non painful patellar compression. Patellar and quadriceps tendons unremarkable. Hamstring and quadriceps strength is normal.     Impression & Recommendations:  Problem # 1:  KNEE PAIN, RIGHT (ICD-719.46) Assessment New New R knee pain with minor effusion on exam.  Pt has full ROM and normal gait on exam.  Will  treat symptomatically with Tylenol.  Will get xray today since pt had fall injury in 01/2010 and was seen in ER but no xrays were done at that time.   Her updated medication list for this problem includes:    Tylenol 500mg  three times a day x 3 days, then three times a day as needed pain  Orders: Radiology other (Radiology Other) 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  This is very acceptable.  Pt encouraged to continue her diet.  Pt lost 2.6 lbs since last visit.   Her updated medication list for this problem includes:    Bayer Childrens Aspirin 81 Mg Chew (Aspirin) .Marland Kitchen... Take 1 tablet by mouth once a day    Metformin Hcl 500 Mg Tabs (Metformin hcl) .Marland Kitchen... 1 tab by mouth twice a day  for diabetes  Orders: Basic Met-[facility] [phone])  Complete Medication List: 1)  Bayer Childrens Aspirin 81 Mg Chew (Aspirin) .... Take 1 tablet by mouth once a day 2)  Clindagel 1 % Gel (Clindamycin phosphate) .... Apply a small amount to affected area twice a day 3)  Famotidine 20 Mg Tabs (Famotidine) .... Take 1 tablet by mouth twice a day 4)  Ascensia Elite Test Strp (Glucose blood) .... Check fasting blood sugar daily in am> 5)  Prodigy Autocode Blood Glucose Devi (Blood glucose monitoring suppl) .... Use as directed 6)  Prodigy Blood Glucose Test Strp (Glucose blood) .... Use as directed 7)  Prodigy Twist Top Lancets 28g Misc (Lancets) .... Use as directed 8)  Miralax Powd (Polyethylene glycol 3350) .Marland Kitchen.. 17g by mouth once daily as needed constipation 9)  Mylanta Gas 125 Mg Chew (Simethicone) .... One tablet by mouth daily as needed gas 10)  Benztropine Mesylate 1 Mg Tabs (Benztropine mesylate) .... Take 1 by mouth two times a day 11)  Divalproex Sodium 500 Mg Tbec (Divalproex sodium) .... Take 1 by mouth two times a day 12)   Paroxetine Hcl 30 Mg Tabs (Paroxetine hcl) .... Take 2 by mouth at bedtime 13)  Fazaclo 100 Mg Tbdp (Clozapine) .... Dissolve 4 tablets in mouth at bedtime 14)  Metoprolol Succinate 50 Mg Xr24h-tab (Metoprolol succinate) .... One by mouth once daily 15)  Differin 0.1 % Gel (Adapalene) .... Apply to skin at bedtime 16)  Depo-provera 150 Mg/ml Susp (Medroxyprogesterone acetate) .... Gets from whog 17)  Metformin Hcl 500 Mg Tabs (Metformin hcl) .Marland Kitchen.. 1 tab by mouth twice a day  for diabetes 18)  Hydrocortisone 1 % Crea (Hydrocortisone) .... Apply to afftected areas two times a day as needed itchiness. may use 1-2 weeks. dispense 30 grams. 19)  Colace 100 Mg Caps (Docusate sodium) .Marland Kitchen.. 1 tab by mouth daily as needed constipation  Patient Instructions: 1)  Please schedule a follow-up appointment in 1 month for diabetes. 2)  Take Tylenol 500mg  1 tab three times a day as needed for need pain.  For the next 3 days, take it three times.  After that go back to as needed. 3)  I will check on knee xray and call you on your cell phone [phone] with the xray result. 4)  Lynnda, I'm proud of your weight loss.  Keep up the good work!    Orders Added: 1)  Radiology other [Radiology Other] 2)  [facility]- Est Level  3 [99213] 3)  Basic Met-[facility] [[id number]]

## 2010-07-22 ENCOUNTER — Encounter: Payer: Self-pay | Admitting: Family Medicine

## 2010-07-22 ENCOUNTER — Ambulatory Visit (INDEPENDENT_AMBULATORY_CARE_PROVIDER_SITE_OTHER): Payer: Medicaid Other | Admitting: Family Medicine

## 2010-07-22 VITALS — BP 125/85 | HR 96 | Temp 98.2°F | Ht 64.0 in | Wt 178.5 lb

## 2010-07-22 DIAGNOSIS — H9209 Otalgia, unspecified ear: Secondary | ICD-10-CM

## 2010-07-22 DIAGNOSIS — H9201 Otalgia, right ear: Secondary | ICD-10-CM

## 2010-07-22 NOTE — Assessment & Plan Note (Signed)
Pt states that she may have pushed the Qtip too far last week.  She was complaining of pain yesterday.  She denies pain today.  Exam showed normal TM, no fluid, no redness, no wax.  Hearing intact.

## 2010-07-22 NOTE — Progress Notes (Signed)
  Subjective:    Patient ID: [name], female    DOB: Apr 12, 1976, 35 y.o.   MRN: [id number]  HPI Pt brought by caregive, Ms Basil Dess for R ear pain.  Pt states that she may have pushed too far with the Qtip sometimes last week.  Yesterday she told Ms Pauletta Browns that her R ear was hurting.  Today she denies any pain.      Review of Systems No fever, chills, redness, drainage, decreased hearing    Objective:   Physical Exam General:  Well-developed,well-nourished,in no acute distress; alert,appropriate and cooperative throughout examination. vitals reviewed.  Head:  normocephalic and atraumatic.   Ears: bilateral ears without redness, fluid, drainage, wax, TM clear Nodes: no nodes    Assessment & Plan:

## 2010-07-22 NOTE — Patient Instructions (Addendum)
Please schedule an appointment for 2 months for diabetes and blood pressure. Your ear pain may be gone now.  Exam of your ears showed everything looked ok. Please continue all your medications as directed from before.

## 2010-07-24 ENCOUNTER — Other Ambulatory Visit: Payer: Self-pay | Admitting: Family Medicine

## 2010-07-24 LAB — POCT PREGNANCY, URINE: Preg Test, Ur: NEGATIVE

## 2010-07-25 NOTE — Telephone Encounter (Signed)
Refill request

## 2010-08-06 ENCOUNTER — Encounter: Payer: Medicaid Other | Attending: Family Medicine | Admitting: Dietician

## 2010-08-06 DIAGNOSIS — E119 Type 2 diabetes mellitus without complications: Secondary | ICD-10-CM | POA: Insufficient documentation

## 2010-08-06 DIAGNOSIS — Z713 Dietary counseling and surveillance: Secondary | ICD-10-CM | POA: Insufficient documentation

## 2010-08-09 ENCOUNTER — Ambulatory Visit (INDEPENDENT_AMBULATORY_CARE_PROVIDER_SITE_OTHER): Payer: Medicaid Other

## 2010-08-09 DIAGNOSIS — Z3049 Encounter for surveillance of other contraceptives: Secondary | ICD-10-CM

## 2010-08-19 ENCOUNTER — Encounter: Payer: Self-pay | Admitting: Family Medicine

## 2010-08-19 ENCOUNTER — Ambulatory Visit (INDEPENDENT_AMBULATORY_CARE_PROVIDER_SITE_OTHER): Payer: Medicaid Other | Admitting: Family Medicine

## 2010-08-19 VITALS — BP 117/79 | HR 100 | Temp 98.1°F | Wt 177.8 lb

## 2010-08-19 DIAGNOSIS — R32 Unspecified urinary incontinence: Secondary | ICD-10-CM | POA: Insufficient documentation

## 2010-08-19 DIAGNOSIS — E663 Overweight: Secondary | ICD-10-CM

## 2010-08-19 LAB — POCT URINALYSIS DIPSTICK
Bilirubin, UA: NEGATIVE
Blood, UA: NEGATIVE
Glucose, UA: NEGATIVE
Leukocytes, UA: NEGATIVE
Nitrite, UA: NEGATIVE
Urobilinogen, UA: 0.2

## 2010-08-19 NOTE — Assessment & Plan Note (Signed)
Pt has lost 1 lb since last visit.  Unfortunately she is not exercising as we discussed during last visit.  We emphasized the importance of daily exercise.  Since pt likes to watch TV, I would like her to incorporate weight lifting during commercials.  Since pt attends school during the week, I have asked her to go for walks Sat and Sun morning.  She will rtc in 1 month.  Will continue healthy diet (no juice/sodas, dec chips/cookies/sweets, eat more vegetables/fruits, healthy protein like chicken/fish).

## 2010-08-19 NOTE — Assessment & Plan Note (Signed)
Pt had wet pajama pants this morning and one day last week.  She also complained of frequency.  UA negative.  Will monitor for recurrent incontinence.  If continues to be a problem, will work up with post void bladder scan.  Pt will likely refuse catherization.

## 2010-08-19 NOTE — Patient Instructions (Addendum)
Make follow up appointment in 1 month. Change polyethylene glycol to once a day AS NEEDED for constipation.  So stop this medication and give it to Loris if she does not have a bowel movement for 3 days. Exercise: 30 minutes of dumb bells every day. Walk Sat and Sun morning for 30 minutes each time.

## 2010-08-19 NOTE — Progress Notes (Signed)
  Subjective:    Patient ID: [name], female    DOB: [birth year], 35 y.o.   MRN: [id number]  HPI Obesity: Pt has been gaining weight and at last visit, we discussed increasing exercise routine.   Cardio/Walking:  Yesterday she walked around the house. She has not walked since last visit.   Strength/Weightlifting: We discussed using dumb bells and doing lifting exercises as pt watches TV. No dyspnea, leg pain, difficulty walking, abd gait, falling, leg swelling.    Urinary frequency: Voided in bed this morning.  No fever, chillls, abd pain, nausea, vomiting.  She also voided in bed last week.  She states that she is voiding more than usual.  Caregiver states that she is drinking a lot of water.     Review of Systems Per hpi     Objective:   Physical Exam  Constitutional: She appears well-developed and well-nourished. No distress.  Neck: Normal range of motion. Neck supple.  Cardiovascular: Normal rate, regular rhythm, normal heart sounds and intact distal pulses.   No murmur heard. Pulmonary/Chest: Effort normal and breath sounds normal. No respiratory distress. She has no wheezes. She has no rales.  Abdominal: Soft. Bowel sounds are normal. She exhibits no distension. There is no tenderness.  Musculoskeletal: Normal range of motion. She exhibits no edema.   General: Well-developed,well-nourished,in no acute distress; alert,appropriate and cooperative throughout examination. vitals reviewed.              Assessment & Plan:

## 2010-08-22 ENCOUNTER — Encounter: Payer: Medicaid Other | Attending: Family Medicine | Admitting: Dietician

## 2010-08-22 DIAGNOSIS — Z713 Dietary counseling and surveillance: Secondary | ICD-10-CM | POA: Insufficient documentation

## 2010-08-22 DIAGNOSIS — E119 Type 2 diabetes mellitus without complications: Secondary | ICD-10-CM | POA: Insufficient documentation

## 2010-08-28 ENCOUNTER — Other Ambulatory Visit: Payer: Self-pay | Admitting: *Deleted

## 2010-08-28 MED ORDER — METOPROLOL SUCCINATE ER 50 MG PO TB24: 50.0000 mg | ORAL_TABLET | Freq: Every day | ORAL | Status: DC

## 2010-09-06 ENCOUNTER — Other Ambulatory Visit: Payer: Self-pay | Admitting: *Deleted

## 2010-09-06 MED ORDER — METOPROLOL SUCCINATE ER 50 MG PO TB24: 50.0000 mg | ORAL_TABLET | Freq: Every day | ORAL | Status: DC

## 2010-09-18 ENCOUNTER — Encounter: Payer: Self-pay | Admitting: Family Medicine

## 2010-09-18 ENCOUNTER — Ambulatory Visit (INDEPENDENT_AMBULATORY_CARE_PROVIDER_SITE_OTHER): Payer: Medicaid Other | Admitting: Family Medicine

## 2010-09-18 VITALS — BP 109/76 | HR 98 | Temp 98.3°F | Wt 170.5 lb

## 2010-09-18 DIAGNOSIS — E1129 Type 2 diabetes mellitus with other diabetic kidney complication: Secondary | ICD-10-CM

## 2010-09-18 LAB — POCT GLYCOSYLATED HEMOGLOBIN (HGB A1C): Hemoglobin A1C: 6

## 2010-09-18 MED ORDER — METFORMIN HCL ER (MOD) 500 MG PO TB24: 500.0000 mg | ORAL_TABLET | Freq: Two times a day (BID) | ORAL | Status: DC

## 2010-09-18 MED ORDER — CLINDAMYCIN PHOSPHATE 1 % EX GEL: Freq: Two times a day (BID) | CUTANEOUS | Status: DC

## 2010-09-18 MED ORDER — POLYETHYLENE GLYCOL 3350 17 G PO PACK: PACK | ORAL | Status: DC

## 2010-09-18 MED ORDER — ADAPALENE 0.1 % EX GEL: Freq: Every day | CUTANEOUS | Status: DC

## 2010-09-18 MED ORDER — SIMETHICONE 125 MG PO CHEW: CHEWABLE_TABLET | ORAL | Status: DC

## 2010-09-18 MED ORDER — FAMOTIDINE 20 MG PO TABS: 20.0000 mg | ORAL_TABLET | Freq: Two times a day (BID) | ORAL | Status: DC

## 2010-09-18 MED ORDER — ACCU-CHEK MULTICLIX LANCETS MISC: Status: DC

## 2010-09-18 MED ORDER — DOCUSATE SODIUM 100 MG PO CAPS: ORAL_CAPSULE | ORAL | Status: DC

## 2010-09-18 MED ORDER — GLUCOSE BLOOD VI STRP: ORAL_STRIP | Status: DC

## 2010-09-18 MED ORDER — METOPROLOL SUCCINATE ER 50 MG PO TB24: 50.0000 mg | ORAL_TABLET | Freq: Every day | ORAL | Status: DC

## 2010-09-18 MED ORDER — HYDROCORTISONE 1 % EX CREA: TOPICAL_CREAM | CUTANEOUS | Status: DC

## 2010-09-18 MED ORDER — ASPIRIN 81 MG PO CHEW: 81.0000 mg | CHEWABLE_TABLET | Freq: Every day | ORAL | Status: DC

## 2010-09-18 NOTE — Assessment & Plan Note (Signed)
Will check A1C today. Last A1C was in 04/2010 and it was 5.7.  Goal is 6.  Pt has lost 7 lbs since last visit.  She is more active during the day by taking more walks and not sleeping as much.  We have also changed her diet to restrict snacks, sodas, juice.  Pt is eating more vegetables.  CBGs have been appropriate, ranging from 90s-170s, but mostly average in the 110s to 120s to 130s.  If she continues to do well with A1C we can consider decreasing Metformin to once a day.  I would like to see a pattern

## 2010-09-18 NOTE — Progress Notes (Signed)
  Subjective:    Patient ID: Micheline Rough, female    DOB: 1976-02-21, 35 y.o.   MRN: [id number]  HPI  DIABETES CBGs: March 89-165, April 98-177, May 91-145 Meds: Metformin 500mg  bid Diet: eating less snacks.  Limiting chips to once a month.  Limiting sugar drinks, she is drinking mostly water Weight difference: -7.3 lbs since last month Chest pain: no  Lightheadedness: no  Syncope: no   Palpitations: no   Tremors: no  Abd pain: no  Fever/chills: no   Jitteriness:no   Urinary incontinence: no problems since last visit   Bowel movements: 1-2 per day, soft, nonbloody   Review of Systems Per hpi     Objective:   Physical Exam  Constitutional: She is oriented to person, place, and time. She appears well-developed and well-nourished. No distress.  Neurological: She is alert and oriented to person, place, and time.  Vitals reviewed.         Assessment & Plan:

## 2010-09-18 NOTE — Patient Instructions (Addendum)
Please schedule an appointment to see your new doctor in August. Great job with the weight loss. Continue the plan as before:  Go for walks, stay out of bed when not sleep time, exercise with dumbbells when watching TV.  On days that you do not go to the park, walk in place with dumbbells during commercials. Avoid juice or sweet drinks or soft drinks.  Drink water or skim milk.  Drink lots of water daily. Avoid snacking. You can have one bag of chips per month.  You can have diet drinks.  All the medicine will continue as before and I have refilled them.

## 2010-09-24 NOTE — Assessment & Plan Note (Signed)
[facility]                         ELECTROPHYSIOLOGY OFFICE NOTE   Jacqueline, Prince                       MRN:          [id number]  DATE:04/16/2007                            DOB:          [birth year]    [name] is referred today by Dr. Lindley Magnus from the [facility] for evaluation of palpitations, tachycardia, and  an abnormal EKG.   HISTORY OF PRESENT ILLNESS:  The patient is a very pleasant 35 year old  woman with a history of schizophrenia and dietary-controlled diabetes  and constipation, who notes that she has had  a history of palpitations  dating back to her childhood.  She states that these would initially  occur when she was feeling anxious or upset or stressed.  The spells  would start suddenly, last for several minutes, up to an hour, and  resolve gradually.  There has bene no documented, prolonged SVT.  The  patient has never had frank syncope.  She does not get chest pain with  her spells.  There is no real shortness of breath.  She was seen in our  [facility] and an EKG demonstrated sinus  tachycardia and a short PR interval and a questionable delta wave, and  she is now referred for additional evaluation.   PAST MEDICAL HISTORY:  Her additional past medical history is notable  for diabetes, which has been dietarily controlled.  Schizophrenia for  which she is on present medical therapy with good control of her  symptoms.  She has a history of constipation as well.   FAMILY HISTORY:  Really noncontributory.   SOCIAL HISTORY:  The patient lives in an adult home.  She denies tobacco  or ethanol use.   REVIEW OF SYSTEMS:  Unremarkable.   PHYSICAL EXAMINATION:  GENERAL:  She is a pleasant, 35 year old woman in  no acute distress.  She answers simple questions very easily.  HEENT:  Normocephalic and atraumatic.  Pupils equal and round.  Oropharynx is moist.  Sclerae  anicteric.  NECK:  Revealed no jugular venous distension.  No thyromegaly.  Trachea  was midline.  Carotids are 2+ and symmetric.  LUNGS:  Clear bilaterally to auscultation.  No wheezes, rales, or  rhonchi were present.  There is no accessories for her breathing.  CARDIOVASCULAR:  Revealed no S1 and S2.  There was a soft S4 gallop  present.  There were no obvious murmur or rubs.  The PMI was not  enlarged and was laterally displaced.  ABDOMEN:  Soft, nontender, nondistended.  There is no organomegaly.  Bowel sounds are present.  EXTREMITIES:  Demonstrated no cyanosis, clubbing, or edema.  The pulses  were 2+ and symmetric.  NEUROLOGIC:  Alert and oriented x3.  Cranial nerves intact.  Strength is  5/5 and symmetric.   The EKG demonstrates sinus rhythm at 100 beats per minute.  The PR  interval was slightly shortened and there was a hint of a delta wave,  although the QRS duration was not increased.   IMPRESSION:  1. Palpitations.  2. Abnormal EKG, possibly secondary to  Wolff-Parkinson-White.   DISCUSSION:  [name] symptoms are not fairly typical for SVT and  WPW syndrome, although with her schizophrenia and medications for this,  it is difficult for me to understand her symptoms completely.  They are  more related to sinus tachycardia.  Because she has palpitations on a  fairly frequent basis, I would recommend that we have her wear a 3 week  cardiac monitor.  I will plan to see her back in the office pending the  results of her cardiac monitor.  I have not recommended any medical  therapy at the present time, specifically no beta blockers, or calcium  blockers, or digoxin.  She will continue on her present medications  which include aspirin, famotidine, Depakote 500 twice daily, benztropine  1 tablet twice daily, paroxetine, and FazaClo 100 mg 4 tablets at  bedtime.     Jacqueline Canning. Ladona Ridgel, MD  Electronically Signed    GWT/MedQ  DD: 04/26/2007  DT: 04/27/2007  Job #:  [id number]   cc:   Altamese Cabal, M.D.

## 2010-09-24 NOTE — Letter (Signed)
April 26, 2007    Altamese Cabal, M.D.  [address], [place], [address]   RE:  [name], [name]  MRN:  [id number]  /  DOB:  Jun 10, 1975   Dear Dr. Iven Finn:   Thank you for referring Ms. [name] for EP evaluation of her  palpitations.  As you know, she is a very pleasant 35 year old woman  with schizophrenia and an abnormal EKG with a history of palpitations  which date back to when she was at least a child or perhaps as a  teenager.  She has never had an episode of documented SVT.  However, she  was noted to have an abnormal EKG with a short PR interval and a  question of a delta wave.  She is here today for additional evaluation.  The patient's examination has been well characterized in the past and  her EKG does demonstrate a short PR interval with evidence of slurring  of the QRS complex.  Her QRS duration, however, is not prolonged so that  she does not have any definitive evidence of WPW.  It is unclear to me,  based on her history, for which she makes me a poor historian, that her  symptoms are due to SVT vs. sinus tachycardia.  To this end, I have  asked that she wear a cardiac monitor for 1 month and allow me to see  her back in the office several  weeks after this.  I have not recommended any medical therapy today;  specifically, no beta-blockers or calcium channel blockers, but will do  so pending the results of her cardiac monitor.   Thanks again for referring Ms. Sibyl Parr for EP evaluation.    Sincerely,      Doylene Canning. Ladona Ridgel, MD  Electronically Signed    GWT/MedQ  DD: 04/26/2007  DT: 04/27/2007  Job #: [id number]

## 2010-09-27 NOTE — Group Therapy Note (Signed)
NAME:  [name], BRAY NO.:  [id number]   MEDICAL RECORD NO.:  [id number]          PATIENT TYPE:  WOC   LOCATION:  [facility]                   FACILITY:  [facility]   PHYSICIAN:  Sharolyn Douglas, CNM        DATE OF BIRTH:  08-19-1975   DATE OF SERVICE:                                    CLINIC NOTE   HISTORY:  [name] is a 35 year old nulliparous female, who is here for  followup visit after being seen here initially on March 07, 2004, for  menometrorrhagia.  She has a history of about one year of heavy menses,  which occur monthly.  She says her cycles last about 10 days, the first 7  being very heavy, and the last 3 light.  She is concerned that she passes  heavy clots.  She is at sometimes weak, but has never had syncope.  She  denies any irritative vaginal discharge.  She is concerned about  hemorrhoids.  She is not sexually active and lives in a supervised home  situation due to her schizoaffective disorder and mental retardation.   OBSTETRICAL HISTORY:  Nulliparous.   GYNECOLOGIC HISTORY:  States that she had a Pap in 2000, but does not know  the results and they are not available.   PAST MEDICAL HISTORY:   MEDICATIONS:  She has several medications for her schizophrenia including  Benzopyrene, Depakote, Paroxetine, Trazodone, and Fazaclo.  She is also on  aspirin and is on Glipizide for her type 2 diabetes diagnosed in 2005.  Other medications include Cimetidine, Peri-Colace, and ferrous sulfate.   PRIMARY CARE PHYSICIAN:  Lawerance Sabal, MD, at [facility].   PAST SURGICAL HISTORY:  Negative.   FAMILY HISTORY:  Significant for diabetes and DVT.   SOCIAL HISTORY:  No tobacco, alcohol, or drug use.   PHYSICAL EXAMINATION:  Limited physical exam today.  VITAL SIGNS:  As above.  GENERAL:  She is overweight and in no distress and seems mentally slow, but  fairly appropriate.  She is, however, a little bit disoriented as to place.  ABDOMEN:   Soft, obese, and no masses noted.  PELVIC EXAM:  Not done.   ASSESSMENT/PLAN:  In consultation with Drs. Steva Colder, the  decision is made that the best course would be to proceed with D&C with Pap  smear and hysteroscopy in order to rule out any pathologic cause for her  abnormal bleeding.  To complete the data base, we are to get the notes from  [facility] due to her medical issues, and she is sent for the  following preliminary laboratory tests today: CBC, BMP, PT, PTT, bleeding  time, Prolactin, and TSH.  We should see her back in a couple of weeks when  these results are available and at that time schedule her for surgery as  above.      DP/MEDQ  D:  08/20/2004  T:  08/20/2004  Job:  [id number]

## 2010-09-27 NOTE — H&P (Signed)
[facility]  Patient:    [name], [name] Visit Number: [id number] MRN: [id number]          Service Type: PSY Location: [id number] [id number] Attending Physician:  Jeanice Lim Dictated by:   Young Berry Scott, N.P. Admit Date:  08/17/2001                     Psychiatric Admission Assessment  DATE OF ADMISSION:  August 17, 2001.  IDENTIFYING INFORMATION:  This is a 35 year old African-American female who is single.  She is an involuntary admission.  HISTORY OF THE PRESENT ILLNESS:  This patient was transferred from the Medicine unit at [facility] after presenting in the emergency room acutely psychotic.  At that time, she was involuntarily committed to [facility], but they could not accept her because she was found to be lithium toxic, with an initial level of 2.37 mEq/l.  The patient was admitted to the medicine unit for hydration and observation and treatment.  The patient was transferred to [facility] April 8, with lithium level resolved to 1.06.  On the medicine unit, the patient had intermittent bouts of screaming and agitation, requiring restraints and IM Geodon and Haldol.  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 Since admitted here on the unit, the patient has been agitated and redirectable only with difficulty.  She was attempting to grab at and hit the nurses.  She has been placed on 1 to 1 observation for safety, has been screaming and crying out and agitated.  Currently, the patient is sedated and is unable to be interviewed.  Her history is taken from the record.  PAST PSYCHIATRIC HISTORY:  The patient is followed by Dr. Gwyndolyn Kaufman at [facility], previously diagnosed with schizophrenia, undifferentiated type.  She has had severe episodes of hallucinations and delusions, reportedly starting at age 22.  She has a history of multiple psychiatric  admissions.  This is her first admission to [facility].  The patient reportedly was previously stable on the following medications:  Abilify 20 mg daily, Paxil 60 mg daily, lithium 600 mg b.i.d., and also Depakote ER 1000 mg at h.s.; however, it is unclear when the Depakote was started, whether that was in the hospital or prior to hospitalization.  SOCIAL HISTORY:  The patient lives in a group home in [place].  She has a guardian named Freddy Finner, and is under guardianship of the [place].  Wes Early at [facility] is her case manager.  FAMILY HISTORY:  Unknown.  The patient is unable to provide this.  ALCOHOL AND DRUG HISTORY:  The patients urine drug screen was negative.  PAST MEDICAL HISTORY:  The patients primary care Aradhana Gin is unknown. Current medical problems include status post lithium toxicity.  Medications while in the hospital included Geodon injections IM, Haldol IM p.r.n., Abilify 10 mg daily, Paxil 60 mg p.o. daily, Depakote ER 1000 mg daily, and Benadryl 25 mg q.6h. p.r.n.  DRUG ALLERGIES:  None.  No known food allergies.  POSITIVE PHYSICAL FINDINGS:  The patients physical examination was done on the Medicine unit and is documented in the record.  Upon transfer, review of her labs reveals her last lithium level was 1.06 on April 5, and her valproic acid level on April 6 was 70.2.  Her CBC reveals a hemoglobin of 11.6, and hematocrit of 34.4 on April 3.  Her last glucose was 117,  alkaline phosphatase mildly elevated at 130.  Her urine HCG was negative.  Her CMET reveals BUN of 10, creatinine of 1.6.  Her electrolytes are within normal limits.  SGOT 13, SGPT 10.  On admission to the unit, her vital signs were temp 98, pulse 87, respirations 18, blood pressure 141/96.  She was unable to be weighed.  MENTAL STATUS EXAMINATION:  This patient is asleep in the bed, quite sedated, so we are unable to do a mental health assessment at this time.  We  will try to do this later.   She was observed briefly last evening.  ADMISSION DIAGNOSES: Axis I:    1. Schizophrenia, undifferentiated type, according to history.            2. Mood disorder not otherwise specified. Axis II:   Deferred. Axis III:  Status post lithium toxicity. Axis IV:   Deferred. Axis V:    Current 26, past year 48.  INITIAL PLAN OF CARE:  Involuntarily admit the patient to stabilize her on medications, alleviate her agitation and hallucinations, and hopefully facilitate her full participation in her activities of daily living, with the amount of cuing and direction appropriate to her previous level of functioning.  We will ask the case manager to contact the patients guardian and her case manager to coordinate care and establish followup plan. Meanwhile, we will check a thyroid panel on her.  We have elected to increase her Abilify to 15 mg daily starting today, and we will consider restarting her lithium after we see how she is reacting on her Depakote dosage, and we are able to give a little bit more observation to her.  We have also provided her with Haldol 5 mg IM or p.o. q.6h. p.r.n. for agitation, and Ativan 1 mg p.o. or IM q.6h. p.r.n. for agitation, and we will put her on 1 to 1 observation for safety at this time, since she has been quite agitated.  ESTIMATED LENGTH OF STAY:  6 days. Dictated by:   Young Berry Scott, N.P. Attending Physician:  Jeanice Lim DD:  08/18/01 TD:  08/18/01 Job: [id number]

## 2010-09-27 NOTE — Discharge Summary (Signed)
[name], SPEAR NO.:  [id number]   MEDICAL RECORD NO.:  [id number]                   PATIENT TYPE:  INP   LOCATION:  3740                                 FACILITY:  [facility]   PHYSICIAN:  Alvira Philips, M.D.                DATE OF BIRTH:  April 09, 1976   DATE OF ADMISSION:  01/13/2003  DATE OF DISCHARGE:  01/16/2003                                 DISCHARGE SUMMARY   INTERN:  Ace Gins, MD   PRIMARY CARE PHYSICIAN:  Noelle C. Merilynn Finland, M.D.   CONSULTATIONS:  Renal consultation, Maree Krabbe, M.D.   DISCHARGE DIAGNOSES:  1. Lithium toxicity.  2. Acute on chronic renal failure.  3. Schizophrenia.  4. Mental retardation.  5. Diabetes mellitus type 2.  6. Obesity.  7. Anemia.   DISCHARGE MEDICATIONS:  1. Depakote 500 mg q.a.m., 1000 mg q.h.s.  2. Iron Sulfate 325 mg p.o. t.i.d.  3. Paxil 40 mg p.o. daily.  4. Pepcid 20 mg p.o. b.i.d.  5. Prolixin 25 mg IM q 2 weeks.  6. Cleocin cream b.i.d. p.r.n.  7. The patient is to hold Lithium and to hold lisinopril at this point in     time.  The patient has listed no new medicines added to regimen at this point in  time.   PROCEDURE:  Hemodialysis x1.   HISTORY/HOSPITAL COURSE:  Briefly, this is a 35 year old mentally retarded  [place] female, who presented with two to three weeks of decreased  appetite, worsening fine tremor, decreased responsiveness.  History of  mental retardation, schizophrenia, followed by Dr. Gwyndolyn Kaufman.  Lithium level was  found to be 4.73 in the ER.  Last level was 0.89 back in December 09, 2002.  No  recent change to any of her other subjective medications.  The patient had  undeveloped decreased responsiveness, altered mental status and tremor.  #1.  Lithium toxicity.  The patient had recently started an ACE inhibitor  administration, lisinopril 10 mg daily on December 16, 2002.  The patient  displayed symptoms of Lithium toxicity including tremors,  sluggishness,  vomiting, hypotension, the possibility of also causing nephrogenic diabetes  insipidus causing dehydration, worsening her chronic renal failure.  The  patient had Lithium dose held at the time of admission and had lisinopril  held as well.  The patient was given normal saline through her IV and was  hemodialyzed secondary to chronic Lithium dosing and Lithium toxicity level  of 12.73.  Renal was consulted and the patient was hemodialyzed x1 time.  The patient had followup Lithium levels that were drawn post hemodialysis  and showed continued decline in her levels to a discharge level of 1.17.  The patient will have Lithium held until followup appointment scheduled with  Dr. Gwyndolyn Kaufman two days after discharge.  Would recommend that the patient be  started on new antipsychotic as Lithium was contributing to the patient's  nephrotoxicity at this point in time as well as subsequent bouts of Lithium  toxicity.   #2.  Acute on chronic renal failure.  The patient had Lithium toxicity with  hypokalemia, with worsening of patient's chronic renal failure.  The  patient's creatinine at the time of admission was 2.9.  The patient was 1.6  on last check which is likely to the patient's baseline.  The patient has  been on Lithium for many years, contributing probably to the patient's  nephrotoxicity.  Renal was consulted and recommend change in patient's  medications off of Lithium to other antipsychotic at this point in time.  The patient had continued decline in creatinine following hemodialysis and  aggressive hydration.  The patient's Basic Metabolic Panel at the time of  discharge was:  Sodium 141, potassium 5.7 which was hemolyzed.  Chloride  112, CO2 23, BUN 10, creatinine of 1.8 at the time of discharge.  The  patient's baseline level is likely to be approximately 1.6 to 1.7.  Recommend at this point in time not to have her restart ACE inhibitor, ARB  per renal recommendations at  this point in time.   #3.  Schizophrenia.  The patient was maintained on her other chronic  medications including Depakote, Prolixin and Paxil with no signs of  psychosis at this point in time.  Would recommend other antipsychotic to  replace Lithium.   #4.  Diabetes mellitus, type 2.  The patient with history of questionable  hyperglycemia after being started on Zyprexa in the past.  The patient is an  obese patient with chronic renal insufficiency and the patient has elevated  blood sugars throughout her entire hospital course, ranging from the 150's  to low 200's.  The patient had A1C that was measured in the past, that  showed an A1C of 6.9.  The patient will likely need to start on some form or  oral agent if aggressive diet and exercise and weight management fail to  control the patient's blood sugars.  No new medications at this point in  time.   #5.  Anemia.  The patient has longstanding history of chronic anemia.  The  patient's hemoglobin remained stable throughout hospital course with  discharge CBC that included white count of 4.7, hemoglobin 9.9, hematocrit  29.6, platelet count 169.  The patient currently on iron therapy at this  point in time.   DISPOSITION:  The patient was discharged back under the care of [name] back to her group home.  At the time of discharge, the  patient much improved symptomatically.  Will followup with Dr. Gwyndolyn Kaufman and Dr.  Merilynn Finland in the next week for hospital followup.                                                 Alvira Philips, M.D.    RM/MEDQ  D:  01/16/2003  T:  01/16/2003  Job:  [id number]   cc:   Dr. [facility]   Noelle C. Merilynn Finland, M.D.  Fam. Med - Resident - [place], [place] [address]  Fax: [phone]

## 2010-09-27 NOTE — Discharge Summary (Signed)
[facility]  Patient:    [name], [name] Visit Number: [id number] MRN: [id number]          Service Type: PSY Location: [phone] [id number] Attending Physician:  Jeanice Lim Dictated by:   Jeanice Lim, M.D. Admit Date:  08/17/2001 Discharge Date: 08/26/2001                             Discharge Summary  IDENTIFYING DATA:  This is a 35 year old African-American female involuntarily admitted, acutely psychotic prior to admission.  The patient has a history of multiple psychiatric admissions.  The patient received Geodon injection and Haldol IM p.r.n. in the hospital.  She had been admitted to the medicine unit for hydration and observation and when lithium level returned to safe range, transferred to [facility].  ALLERGIES:  No known drug allergies.  PHYSICAL EXAMINATION:  GENERAL:  Essentially within normal limits.  NEUROLOGIC:  Nonfocal.  ROUTINE ADMISSION LABORATORY DATA:  Not necessary due to labs being done in the medical hospital.  Hemoglobin was low at 11.6, hematocrit 34.4.  Valproic acid level 70.2.  BUN 10, creatinine 1.6.  Electrolytes: Within normal limits. Liver function tests: Within normal limits.  MENTAL STATUS EXAMINATION:  The patient was asleep in bed, quite sedated, unable to mental assessment at the time of admission.  The patient later appeared depressed with a restricted affect.  Thought process: Mostly goal directed, somewhat disorganized.  Thought content: Positive for overt psychotic symptoms.  Judgment and insight: Limited.  ADMITTING DIAGNOSES: Axis I:    Schizophrenia, undifferentiated type. Axis II:   None. Axis III:  Status post lithium toxicity. Axis IV:   Moderate; limited support system. Axis V:    28/50  HOSPITAL COURSE:  The patient was admitted and ordered routine p.r.n. medications and continued on Abilify, Paxil, Depakote, Ativan, Haldol p.r.n., and required Haldol and Ativan p.r.n. for acute agitation.   The patient underwent medical monitoring and optimization of Depakote and Abilify.  The patient was given Geodon IM if refused p.o. Loxitane as a p.r.n.  She began to become more compliant with p.o. medications and showed improvement with decreased agitation and decrease in severity of psychotic symptoms.  Group home felt the patient was back at baseline and the patient reported a positive response to medication changes without side effects.  CONDITION AT DISCHARGE:  Improved.  Mood: More stable.  Thought process: More goal directed.  Thought content: Less preoccupied with psychotic symptoms, no distress or threatening behavior or dangerous ideation.  DISCHARGE MEDICATIONS: 1. Depakote ER 500 mg q.a.m. and q.h.s. 2. Paxil 20 mg q.a.m. and q.h.s. 3. Loxitane 10 mg two q.a.m. and two q.h.s. 4. Ambien 10 mg q.h.s. p.r.n.  FOLLOWUP:  [facility] on Thursday, April 24 at 9:30 a.m.  DISCHARGE DIAGNOSES: Axis I:    Schizophrenia, undifferentiated type. Axis II:   None. Axis III:  Status post lithium toxicity. Axis IV:   Moderate, limited support system. Axis V:    Global assessment of functioning on discharge was 50. Dictated by:   Jeanice Lim, M.D. Attending Physician:  Jeanice Lim DD:  10/06/01 TD:  10/07/01 Job: [id number]

## 2010-09-27 NOTE — Op Note (Signed)
NAMESUMIYE, HIRTH NO.:  [id number]   MEDICAL RECORD NO.:  [id number]          PATIENT TYPE:  AMB   LOCATION:  SDC                           [facility]   PHYSICIAN:  Lesly Dukes, M.D. DATE OF BIRTH:  1976-02-07   DATE OF PROCEDURE:  09/30/2004  DATE OF DISCHARGE:                                 OPERATIVE REPORT   PREOPERATIVE DIAGNOSIS:  A 35 year old female with abnormal uterine  bleeding.   POSTOPERATIVE DIAGNOSIS:  A 35 year old female with abnormal uterine  bleeding.   PROCEDURE:  Dilatation and curettage, hysteroscopy, plus polypectomy.   SURGEON:  Lesly Dukes, M.D.   PATHOLOGY:  [facility].   ESTIMATED BLOOD LOSS:  Minimal.   COMPLICATIONS:  None.   FINDINGS:  The uterus sounded to 9 cm.  The patient has a virginal introitus  with an intact hymen.  The endometrium has several polyps that were  photographed and removed successfully.  No adnexal mass was felt on  bimanual.  Pap smear not done secondary to the patient virgin.   PROCEDURE:  After informed consent was obtained from the legal guardian, the  patient was taken to the operating room, where general anesthesia was  induced.  The patient was placed in the dorsal lithotomy position and  prepared and draped in the normal sterile fashion.  The bladder was in-and-  out catheterized.  A pediatric speculum was placed into the patient's vagina  and the anterior lip of the cervix was grasped with a single-tooth  tenaculum.  The cervical os was gently dilated with Shawnie Pons dilators.  The  hysteroscope was gently introduced and the uterus was distended with a fluid  medium.  There were several polyps as described above.  The hysteroscope was  removed and a sharp curette was gently introduced into the uterus and sharp  curettage was performed.  The hysteroscope was reintroduced into the uterus  and all polyps were noted to be gone.  Pictures were taken.  All instruments  were removed from the  patient's vagina, and there was noted to be some  bleeding at the hymenal ring on the left around 5 o'clock.  Hemostasis was  achieved with direct pressure and one stick of silver nitrate.  The cervix  was also noted to be hemostatic at the end of the procedure.  The sponge,  lap and instrument count were correct x2, and the patient went to the  recovery room in stable condition.      KHL/MEDQ  D:  09/30/2004  T:  09/30/2004  Job:  [id number]

## 2010-09-27 NOTE — Group Therapy Note (Signed)
[name] NO.:  [id number]   MEDICAL RECORD NO.:  [id number]          PATIENT TYPE:  WOC   LOCATION:  [facility]                   FACILITY:  [facility]   PHYSICIAN:  Carolanne Grumbling, M.D.   DATE OF BIRTH:  1975-06-09   DATE OF SERVICE:  02/11/2005                                    CLINIC NOTE   HISTORY OF PRESENT ILLNESS:  Patient is a 35 year old African-American  female with mental illness who presents with her caretaker for evaluation of  low abdominal pain.  Patient had a D&C in May of 2006 for benign polyps.  Since then she reports that she has not had any trouble with vaginal  bleeding.  Patient describes the abdominal pain as starting in August.  It  feels like a big lump on her right side with a pulling sensation.  It will  last for a couple days, then improve spontaneously.  She is not sure what  makes it worse, but thinks maybe some movement.  Does not think it is  affected by what she eats.  Does have lots of trouble with constipation and  is on treatment for that.  She states that she will have a bowel movement  every two to three days, but describes it as being small balls.  Patient  is not sexually active, but is on Depo Provera.   PHYSICAL EXAMINATION:  VITAL SIGNS:  Temperature 98.8, blood pressure  156/96, pulse 110.  GENERAL:  Very pleasant female who is in no apparent distress.  She does  have some trouble answering the questions.  CARDIOVASCULAR:  Regular rate and rhythm.  LUNGS:  Clear to auscultation bilaterally.  ABDOMEN:  Soft.  When I listen with my stethoscope and push very hard she  does not grimace.  However, with palpation she will grimace.  She has no  peritoneal signs.  Positive bowel sounds.  Overall, examination is very  benign.  EXTREMITIES:  No clubbing, cyanosis, edema.   IMPRESSION:  Abdominal pain.   PLAN:  Explained that the pain may be secondary to constipation or some  flatulence.  Recommend that she take  simethicone 80 mg two tablets one  q.i.d. when the pain starts to see if that helps.  Lidocaine topical p.r.n.  can be discontinued as she never seems to use that.  Patient is to return if  condition worsens.  Case was discussed with Dr. Penne Lash.           ______________________________  Carolanne Grumbling, M.D.     TW/MEDQ  D:  02/11/2005  T:  02/12/2005  Job:  [id number]

## 2010-09-27 NOTE — Group Therapy Note (Signed)
NAMEJAMESIA, LINNEN NO.:  [id number]   MEDICAL RECORD NO.:  [id number]          PATIENT TYPE:  WOC   LOCATION:  [facility]                   FACILITY:  [facility]   PHYSICIAN:  Argentina Donovan, MD        DATE OF BIRTH:  18-Jan-1976   DATE OF SERVICE:  03/07/2004                                    CLINIC NOTE   CHIEF COMPLAINT:  Menometrorrhagia.   SUBJECTIVE:  Ms. Mulnix is here today for menometrorrhagia.  Since April  she has been having very heavy cycles.  Her cycles last 10 days, the first 7  days being very heavy and the last 3 being light.  She passes heavy blood  clots.  She does complain of some lightheadedness and weakness.  No  dysmenorrhea.  She is not sexually active.  No vaginal discharge.   OBSTETRICAL HISTORY:  She has never been pregnant.  Her last Pap smear was  in 2000 and we do not have those results.   PAST MEDICAL HISTORY:  Recently diagnosed with diabetes in April and placed  on aspirin and glipizide.  Also has schizophrenia and is on benzopyrene,  Depakote, paroxetine, trazodone, and Fazaclo.  Her home health worker said  she has also been recently started on Clozaril.   Other medications:  Famotidine, Peri-Colace, ferrous sulfate.  She was just  started on ferrous sulfate by Dr. Evonnie Pat when she was seen in September.   Her menarche started at age 61.   PAST SURGICAL HISTORY:  Negative.   FAMILY HISTORY:  Significant for diabetes and DVT.   SOCIAL HISTORY:  No tobacco use, no alcohol.   OBJECTIVE:  VITAL SIGNS:  Noted.  GENERAL:  She is an overweight female in no acute distress.  ABDOMEN:  Soft and benign.  PELVIC:  Reveals normal external female genitalia, normal urethra and  vaginal wall.  There is significant bleeding from the vagina.  The remainder  of the exam was deferred by the patient due to pain and discomfort.   ASSESSMENT AND PLAN:  Menometrorrhagia.  We deferred a Pap smear and  endometrial biopsy due to the fact that the  patient has a history of mental  retardation and schizophrenia and was uncomfortable with the exam.  She  should go ahead and continue her iron.  I have checked a CBC today.  Due to  the fact that she was started on aspirin in April and then started having  this heavy bleeding, will go ahead and check a bleeding time as well as a PT  and PTT to rule out things such as von Willebrand's disease.  Today we gave  her a Depo-Provera 150 mg injection to try to control her bleeding.  This is  probably going to be the easiest for her to continue on since it may be  difficult for her to take oral medication.  I have also ordered an  ultrasound to rule out any kind of malignancy but this is doubtful in a 47-  year-old.   The patient should follow up with 3 weeks after ultrasound is done.  LC/MEDQ  D:  03/07/2004  T:  03/07/2004  Job:  [id number]   cc:   Lawerance Sabal, MD

## 2010-09-27 NOTE — Op Note (Signed)
   [name], [name]                          ACCOUNT NO.:  [id number]   MEDICAL RECORD NO.:  [id number]                   PATIENT TYPE:  AMB   LOCATION:  DSC                                  FACILITY:  [facility]   PHYSICIAN:  Pasty Spillers. Maple Hudson, M.D.              DATE OF BIRTH:  02/18/1976   DATE OF PROCEDURE:  DATE OF DISCHARGE:                                 OPERATIVE REPORT   PREOPERATIVE DIAGNOSIS:  Cystic lesion, left upper eyelid.   POSTOPERATIVE DIAGNOSIS:  Cystic lesion, left upper eyelid.   PROCEDURE:  Excision of cystic lesion, left upper eyelid.   SURGEON:  Dr. Maple Hudson.   ANESTHESIA:  General (laryngeal mask).   COMPLICATIONS:  None.   DESCRIPTION OF PROCEDURE:  After routine preoperative evaluation, including  informed consent from the legal guardian, the patient was taken to the  operating room where she was identified by me.  General anesthesia was  induced without difficulty after placement of appropriate monitors.  The  patient was prepped and draped in standard sterile fashion.  Approximately  0.5 cc of 1% Xylocaine with epinephrine, 1:100,000 was infiltrated around  the margins of the cystic lesion, which measured approximately 1.0 cm in  diameter and was elevated approximately 0.7 cm.   A #15 blade was used to make a skin incision approximately 2.0 cm long,  following the line of the lid crease, extended temporally along the superior  margin of the cystic lesion.  Careful dissection was carried out around the  lesion, but despite this, the lesion burst and collapsed.  The superior  portions of the sac were excised.  The inferior portion of the sac was found  to be adherent to the skin surface and could not be separated from it.  A  second skin incision was then made inferior to the first, completing an  ellipse, excising the skin overlying the cyst with the remaining cyst wall.  The orbicularis layers were closed with three interrupted 6-0 Vicryl  sutures.   The skin was closed with six interrupted 6-0 plain gut sutures.  Polysporin ophthalmic ointment was placed on the wound.  The patient was  awakened without difficulty and taken to the recovery room in stable  condition, having suffered no intraoperative or immediate postoperative  complications.                                               Pasty Spillers. Maple Hudson, M.D.    Cheron Schaumann  D:  05/20/2002  T:  05/21/2002  Job:  [id number]

## 2010-09-27 NOTE — Discharge Summary (Signed)
[facility]  Patient:    [name], [name] Visit Number: [id number] MRN: [id number]          Service Type: PSY Location: [phone] 02 Attending Physician:  Jeanice Lim Dictated by:   Lorelle Formosa, M.D. Admit Date:  08/17/2001 Discharge Date: 08/26/2001                             Discharge Summary  ADMISSION DIAGNOSES: 1. Schizoaffective disorder. 2. Mild mental retardation. 3. Psychosis. 4. Lithium toxicity.  DISCHARGE DIAGNOSES: 1. Psychosis. 2. Schizophrenia. 3. Mental retardation. 4. Lithium toxicity resolved. 5. Mild renal insufficiency.  CONDITION ON DISCHARGE:  Stable.  DISPOSITION:  The patient is transferred to [facility] per recommendation of psychiatrist, Dr. Jeanie Sewer.  HISTORY OF PRESENT ILLNESS:  This patient is a 35 year old, single, African-American woman who lives in a residential home.  She was assessed in the emergency room here on the day prior to admission and had a Lithium level of 2.37 which is toxic.  It is normally 0.8 to 1.4.  Valproic acid was 61. She was requested to hold Lithium, but became more agitated and was committed by the courts to [facility].  She was admitted through the emergency room for medical clearance of toxic Lithium status because they do not accept patients with medical instability.  The patient was admitted for further stabilization per Dr. Ronne Binning, as the on call physician for unassigned patients.  ALLERGIES:  The patients record was not otherwise available, but she had no known drug allergies.  MEDICATIONS: 1. Abilify 20 mg daily. 2. Depakote 1000 mg q.h.s. 3. Paxil 60 mg daily. 4. Clindamycin cream for her face.  PHYSICAL EXAMINATION:  VITAL SIGNS:  Blood pressure 116/68, pulse 76, respiratory rate 18, temperature 98.4.  GENERAL:  The patient appeared mildly overweight and had a pleasant appearance on my arrival, although she was basically in physical  restraints of all four extremities.  She immediately yelled out and was very boisterous and loud when not attended.  She was verbally appropriate to my exam.  NECK:  Supple.  CHEST:  Clear to auscultation.  HEART:  Regular rate and rhythm.  BREASTS:  Normal.  GENITALIA:  Grossly normal.  EXTREMITIES:  Revealed the patient in soft restraints.  SKIN:  Slightly dry.  NEUROLOGIC:  The patient is agitated, but able to move all extremities.  LABORATORY DATA AND X-RAY FINDINGS:  Creatinine 1.9 and repeat was 1.8, glucose 139 with repeat 101.  Alk phos 135.  Urinalysis revealed specific gravity of 1.003.  Hemoglobin 9.6, white count 8.2 and repeat 13.7.  Drug screen revealed no cocaine, amphetamines, cannabinoids, opiates or barbiturates.  Platelets 163,000 initially with repeat 385,000.  HOSPITAL COURSE:  The patient was admitted to the hospital and seen in consultation by Dr. Jeanie Sewer, pyschiatrist, who filled out comittment papers for her to be transferred to the [facility].  His diagnosis was schizophrenia, status post Lithium toxicity with active psychosis.  The patient was thus stable and discharged for outpatient management to the [facility]. Dictated by:   Lorelle Formosa, M.D. Attending Physician:  Jeanice Lim DD:  09/23/01 TD:  09/26/01 Job: [id number]

## 2010-09-27 NOTE — Op Note (Signed)
NAMECATHELINE, [name]              ACCOUNT NO.:  [id number]   MEDICAL RECORD NO.:  [id number]          PATIENT TYPE:  AMB   LOCATION:  DSC                          FACILITY:  [facility]   PHYSICIAN:  Earvin Hansen L. Shon Hough, M.D.DATE OF BIRTH:  1975/08/20   DATE OF PROCEDURE:  08/04/2005  DATE OF DISCHARGE:  08/05/2005                                 OPERATIVE REPORT   A 35 year old lady with severe macromastia, back and shoulder pain secondary  to large pendulous breasts.   PLAN OF ACTION:  Bilateral breast reductions using the inferior pedicle  technique.   ANESTHESIA:  General.   Preoperatively, the patient was set up and drawn for the inferior pedicle  reduction mammoplasty, re-marking the nipple areolar complexes back 20 cm  from the suprasternal notch.  She then underwent general anesthesia,  intubated orally.  Prep was done to the chest and breast areas in a routine  fashion using Betadine soap and solution, walled off with sterile towels and  draped so as to make a sterile field.  The areas were scored with #15 blades  after 0.25% Xylocaine was injected locally 1:400,000 concentration, a total  of 150 mL per side.  The medial and lateral fatty dermal pedicles were  excised down to underlying fascia.  Out laterally, more tissue was taken in  the axillary breast areas.  Next, after proper hemostasis the new keyhole  area was debulked and then the flaps were transposed and stayed with 3-0  Prolene sutures at the 6 o'clock position midline.  Subcutaneous closure was  done with 3-0 Monocryl x2 layers, then running subcuticular stitches of 3-0  Monocryl and 5-0 Monocryl throughout the inverted T.  The wounds were  drained with a #10 fully-fluted Blake drain which was placed in the depths  of the wound and brought out through the lateral-most portion of the  incision and secured with 3-0 Prolene.  The wounds were cleansed, Steri-  Strips and soft dressings were applied including Xeroform,  4x4's, ABDs,  Hypafix tape.  At the end of the procedure, the areolae were examined with  nipples showing good blood supply and refill.  She was then taken to  recovery in excellent condition.   ESTIMATED BLOOD LOSS:  Approximately 150 mL.   COMPLICATIONS:  None.      Yaakov Guthrie. Shon Hough, M.D.  Electronically Signed     GLT/MEDQ  D:  08/07/2005  T:  08/08/2005  Job:  [id number]

## 2010-09-27 NOTE — Consult Note (Signed)
NAMEORVILLE, WIDMANN NO.:  [id number]   MEDICAL RECORD NO.:  [id number]                   PATIENT TYPE:  INP   LOCATION:  3304                                 FACILITY:  [facility]   PHYSICIAN:  Maree Krabbe, M.D.             DATE OF BIRTH:  Apr 07, 1976   DATE OF CONSULTATION:  01/13/2003  DATE OF DISCHARGE:                                   CONSULTATION   REASON FOR CONSULTATION:  Elevated creatinine and lithium toxicity.   HISTORY OF PRESENT ILLNESS:  The patient is a 35 year old white female with  history of mental retardation, chronic schizophrenia, and bipolar disorder  who lives in a group home.  She has 24-hour private nursing care.  She was  brought to the emergency room by her caregiver who reports a three-week  history of decreasing appetite, worsening of her chronic tremor, decreased  interaction levels, and difficulty with ambulation.  Lithium level in the  emergency room was 4.73 today.  The last level was 0.89 on July 30.  She had  recently been started on an ACE inhibitor, Lisinopril, on August 6.   PAST MEDICAL HISTORY:  1. Obesity.  2. Anemia.  3. Schizophrenia.  4. Mental retardation.  5. Galacturia.  6. Acne.  7. Back pain.  8. Diabetes mellitus, type 2.   FAMILY HISTORY:  Noncontributory.   SOCIAL HISTORY:  She lives in a group home on [address].  She has a  twin sister with similar problems.  Medications are administered by an  agency.  She has no children.  She is not married.  She has family who do  not stay in contact with her.   MEDICATIONS:  1. Cleocin b.i.d.  2. Depakote 500 in the morning, 1 g in the evening.  3. Diclofenac p.r.n.  4. Iron sulfate t.i.d.  5. Lisinopril 10 mg  daily.  6. Lithium 600 mg b.i.d.  7. Paxil 40 mg daily.  8. Pepcid 20 mg b.i.d.  9. Prolixin 25 IM every 2 weeks.   ALLERGIES:  MINOCYCLINE.   REVIEW OF SYSTEMS:  GENERAL:  Positive for subjective fever.  No chills,  weight  loss, or night sweats.  ENT: No hearing loss, visual change, sore  throat, or difficulty swallowing.  CARDIORESPIRATORY:  No chest pain,  shortness of breath, or hemoptysis.  GI:  She has been vomiting.  No  abdominal pain or diarrhea.  GU:  No change in voiding habits, dysuria,  hematuria, or change in the color of the urine.  MUSCULOSKELETAL: No  arthralgia, myalgia, joint swelling, or pain.  NEUROLOGIC: No focal numbness  or weakness.  Otherwise as noted above.  ENDOCRINE: No history of polyuria  or polydipsia.   PHYSICAL EXAMINATION:  GENERAL:  Pleasant, overweight black female.  The  patient has a moderately coarse tremor which is significant.  She has some  twitching of the eyelids also.  She appears fatigued.  VITAL SIGNS:  Blood pressure 98/50, pulse 90, respirations 20, O2 saturation  97% on room air, temperature 100.1.  HEENT:  PERRLA.  EOMI.  Throat was clear and moist.  NECK:  Supple with flat neck veins.  CHEST:  Clear throughout.  CARDIAC:  Regular rate and rhythm without murmur, rub, or gallop.  ABDOMEN:  Soft, nontender, obese, without organomegaly.  EXTREMITIES:  1+ edema of the ankles.  No cyanosis, gangrene, or ulceration.  NEUROLOGIC:  Diffuse tremor is noted.  No asterixis or myoclonus.  No focal  numbness or weakness.  She has generalized weakness, particularly in the  lower legs, about 4/5 strength.   LABORATORY DATA:  White blood cell count 6000, hemoglobin 12.  Sodium 134,  potassium 5.5, CO2 22, BUN 22, creatinine 2.9, glucose 153.   EKG: Normal sinus rhythm at 87.   IMPRESSION:  1. Lithium toxicity related to reduced renal secretion related to acute     renal insufficiency.  2. Acute on chronic renal insufficiency due to addition of an ACE inhibitor     in the setting of probable volume depletion and p.r.n. nonsteroidal anti-     inflammatory drug usage.  3. Chronic renal failure, baseline creatinine 1.6.  4. Mental retardation.  5. Chronic  schizoaffective disorder.  6. Questionable history of hypertension.   PLAN:  1. Acute dialysis, then follow up levels and clinical progress.  2. Agree with IV fluids at this time.                                               Maree Krabbe, M.D.    RDS/MEDQ  D:  01/13/2003  T:  01/14/2003  Job:  [id number]

## 2010-10-08 ENCOUNTER — Telehealth: Payer: Self-pay | Admitting: Family Medicine

## 2010-10-08 ENCOUNTER — Other Ambulatory Visit: Payer: Self-pay | Admitting: Family Medicine

## 2010-10-08 MED ORDER — GLUCOSE BLOOD VI STRP: ORAL_STRIP | Status: DC

## 2010-10-08 MED ORDER — ACCU-CHEK MULTICLIX LANCETS MISC: Status: AC

## 2010-10-08 NOTE — Telephone Encounter (Signed)
Calling to let MD know she does not need new prescriptions, would like MD to just make changes (says she dropped off a packet for MD & she should be aware)

## 2010-10-08 NOTE — Telephone Encounter (Signed)
Left message for patient to return call. Rx is ready for pick up.Busick, Rodena Medin

## 2010-10-22 ENCOUNTER — Ambulatory Visit (INDEPENDENT_AMBULATORY_CARE_PROVIDER_SITE_OTHER): Payer: Medicaid Other | Admitting: Family Medicine

## 2010-10-22 ENCOUNTER — Encounter: Payer: Self-pay | Admitting: Family Medicine

## 2010-10-22 DIAGNOSIS — K648 Other hemorrhoids: Secondary | ICD-10-CM

## 2010-10-22 HISTORY — DX: Other hemorrhoids: K64.8

## 2010-10-22 MED ORDER — HYDROCORTISONE 2.5 % RE CREA: TOPICAL_CREAM | Freq: Two times a day (BID) | RECTAL | Status: DC

## 2010-10-22 MED ORDER — POLYETHYLENE GLYCOL 3350 17 GM/SCOOP PO POWD: 17.0000 g | Freq: Every day | ORAL | Status: AC

## 2010-10-22 MED ORDER — HYDROCORTISONE 2.5 % RE CREA: TOPICAL_CREAM | Freq: Two times a day (BID) | RECTAL | Status: AC

## 2010-10-22 NOTE — Patient Instructions (Addendum)
Keep stools soft, polyeth glycol and water will do this Use the cream rectally for one week. Return in no improvement

## 2010-10-22 NOTE — Assessment & Plan Note (Signed)
Resume daily PEG to keep stools soft, add anusol-HC 2.5% rectally bid for 7 days.  Expect full recovery.  If problems persists they will return.

## 2010-10-22 NOTE — Progress Notes (Signed)
  Subjective:    Patient ID: [name], female    DOB: [birth year], 35 y.o.   MRN: [id number]  HPI Hard stool passed several days ago, resulting in local rectal bleeding.  Miralax had been cut back to prn or every 3 days if no BM.  When she was receiving it daily, she had daily soft stools but [name] did not like having dailiy BM according to her caretaker.  She is on a number of psych meds that cause constipation as well.   Review of Systems  Constitutional: Negative for fever.  Gastrointestinal: Positive for constipation, anal bleeding and rectal pain. Negative for abdominal pain and abdominal distention.       Objective:   Physical Exam  Constitutional:       Moderate MR, alert, cooperative with exam  Abdominal: Soft. Bowel sounds are normal. She exhibits no distension and no mass. There is no tenderness.  Genitourinary: Guaiac positive stool.       No external tags, aprox 1/2 cm beyone anal spincter, dark red stool + heme.          Assessment & Plan:

## 2010-10-25 ENCOUNTER — Ambulatory Visit: Payer: Medicaid Other

## 2010-10-25 DIAGNOSIS — Z3049 Encounter for surveillance of other contraceptives: Secondary | ICD-10-CM

## 2010-11-21 ENCOUNTER — Ambulatory Visit: Payer: Medicaid Other | Admitting: Dietician

## 2010-11-26 ENCOUNTER — Other Ambulatory Visit: Payer: Self-pay | Admitting: Family Medicine

## 2010-11-26 ENCOUNTER — Encounter: Payer: Self-pay | Admitting: Family Medicine

## 2010-11-26 NOTE — Telephone Encounter (Signed)
Refill request

## 2010-11-26 NOTE — Telephone Encounter (Signed)
Spoke with caretaker and she is doing fine right now with her blood sugars and she made an appt for [name] for 7/26.

## 2010-11-26 NOTE — Telephone Encounter (Signed)
Will ask administrative staff to please contact pt and have her set up an appointment with me.  She has asked for a metformin refill.  Her creatinine level has been elevated for the past few checks.  I think that we may need to possibly d/c this medication.  Will ask pt to come in so we can discuss her diabetes care plan in detail.

## 2010-11-28 ENCOUNTER — Encounter: Payer: Self-pay | Admitting: Family Medicine

## 2010-11-28 ENCOUNTER — Ambulatory Visit (INDEPENDENT_AMBULATORY_CARE_PROVIDER_SITE_OTHER): Payer: Medicaid Other | Admitting: Family Medicine

## 2010-11-28 VITALS — BP 100/70 | HR 100 | Temp 98.1°F | Ht 66.0 in | Wt 175.0 lb

## 2010-11-28 DIAGNOSIS — K5909 Other constipation: Secondary | ICD-10-CM

## 2010-11-28 DIAGNOSIS — E1129 Type 2 diabetes mellitus with other diabetic kidney complication: Secondary | ICD-10-CM

## 2010-11-28 DIAGNOSIS — I10 Essential (primary) hypertension: Secondary | ICD-10-CM

## 2010-11-28 LAB — BASIC METABOLIC PANEL
BUN: 26 mg/dL — ABNORMAL HIGH (ref 6–23)
Calcium: 9.8 mg/dL (ref 8.4–10.5)
Creat: 1.62 mg/dL — ABNORMAL HIGH (ref 0.50–1.10)
Potassium: 4.4 mEq/L (ref 3.5–5.3)

## 2010-11-28 MED ORDER — POLYETHYLENE GLYCOL 3350 17 G PO PACK: PACK | ORAL | Status: DC

## 2010-11-28 NOTE — Assessment & Plan Note (Signed)
Will obtain a recheck of Creatinine.  If creatinine improved than will continue with metformin.  If continues to have creatinine of 1.5 will d/c metformin.  Discussed this plan with pt and caregiver, they agree with this plan.

## 2010-11-28 NOTE — Assessment & Plan Note (Signed)
Improved on miralax daily.  Will continue on current regimen.

## 2010-11-28 NOTE — Patient Instructions (Signed)
Return in 3 months for follow up appointment or sooner if needed.  I will call you with results of lab work.

## 2010-11-28 NOTE — Progress Notes (Signed)
  Subjective:    Patient ID: [name], female    DOB: 10-Jan-1976, 35 y.o.   MRN: [id number]  HPI Diabetes: In good control, usually runs 100-130.  A1C wnl at last check.  Pt is working with caretaker to improve diet and increase exercise.  Last bmet showed some elevation in creatinine in 2/12.  I had requested that pt come in for eval prior to refill of metformin.  Pt here to discuss metformin  Tachycardia: HR 100.  BP 100/70.  Taking toprol 50mg  po daily.  SH: lives with caretaker, see social history documentation area.    Review of Systems No fever. No weight change. No appetite change. No rash.      Objective:   Physical Exam  Constitutional: She is oriented to person, place, and time. She appears well-developed.       obese  HENT:  Head: Normocephalic and atraumatic.  Cardiovascular: Normal rate and regular rhythm.  Exam reveals no gallop and no friction rub.   No murmur heard. Pulmonary/Chest: Effort normal. No respiratory distress.  Neurological: She is alert and oriented to person, place, and time.  Skin: No rash noted.  Psychiatric: Her behavior is normal. Thought content normal.       Flat affect, + responsive smile,  Answering questions, slowed but appropriate conversation and responses to questions.             Assessment & Plan:

## 2010-11-29 ENCOUNTER — Encounter: Payer: Self-pay | Admitting: Family Medicine

## 2010-11-29 ENCOUNTER — Other Ambulatory Visit: Payer: Self-pay | Admitting: Family Medicine

## 2010-12-01 ENCOUNTER — Telehealth: Payer: Self-pay | Admitting: Family Medicine

## 2010-12-01 NOTE — Telephone Encounter (Signed)
Spoke with pt caregiver on 11/29/10,  to give results of BMET.  After review of results together we decided to stop the metformin since creatinine remains elevated and pt is under good glucose control at this time.  We will recheck a1c in 3 months.

## 2010-12-04 ENCOUNTER — Other Ambulatory Visit: Payer: Self-pay | Admitting: Family Medicine

## 2010-12-05 ENCOUNTER — Ambulatory Visit: Payer: Medicaid Other | Admitting: Family Medicine

## 2010-12-10 ENCOUNTER — Encounter: Payer: Self-pay | Admitting: Internal Medicine

## 2010-12-10 ENCOUNTER — Ambulatory Visit (INDEPENDENT_AMBULATORY_CARE_PROVIDER_SITE_OTHER): Payer: Medicaid Other | Admitting: Internal Medicine

## 2010-12-10 VITALS — BP 100/82 | HR 96 | Ht 64.0 in | Wt 174.0 lb

## 2010-12-10 DIAGNOSIS — Z8679 Personal history of other diseases of the circulatory system: Secondary | ICD-10-CM

## 2010-12-10 DIAGNOSIS — I479 Paroxysmal tachycardia, unspecified: Secondary | ICD-10-CM

## 2010-12-10 DIAGNOSIS — E663 Overweight: Secondary | ICD-10-CM

## 2010-12-10 NOTE — Assessment & Plan Note (Signed)
Her symptoms appear to be well controlled. We discussed the possibility of stopping her beta blocker. She has had no side effects from the beta blocker and her palpitations have been well controlled. For this reason, I would recommend continuing this medication.

## 2010-12-10 NOTE — Assessment & Plan Note (Addendum)
Today we discussed the importance of increasing her physical activity. She has gained weight in the last year. She tends to sit around and admits to eating more than she should. Over the next year, I have asked her to lose at least 15 pounds.

## 2010-12-10 NOTE — Progress Notes (Signed)
HPI [name] returns today for followup. She is a very pleasant 35 year old woman with a history of palpitations, a short PR interval, diabetes, and schizo effective disorder. I have not seen the patient for over a year. She has done well from an arrhythmia perspective. She has denied palpitations. She is tolerating her beta blocker nicely. No syncope, shortness of breath, or chest pain. She admits to dietary indiscretion. She also admits to being very sedentary. She has not been exercising. Allergies  Allergen Reactions  . Minocycline      Current Outpatient Prescriptions  Medication Sig Dispense Refill  . adapalene (DIFFERIN) 0.1 % gel Apply topically at bedtime.  45 g  6  . aspirin 81 MG chewable tablet Chew 1 tablet (81 mg total) by mouth daily.  30 tablet  11  . benztropine (COGENTIN) 1 MG tablet Take 1 mg by mouth 2 (two) times daily.        . clindamycin (CLINDAGEL) 1 % gel Apply topically 2 (two) times daily. Apply small amount to affected area twice daily   30 g  6  . clozapine (FAZACLO) 100 MG disintegrating tablet Take 100 mg by mouth. Dissolve four tablets in mouth at bedtime       . divalproex (DEPAKOTE) 500 MG 24 hr tablet Take 500 mg by mouth daily.        Marland Kitchen docusate sodium (COLACE) 100 MG capsule 1 tab by mouth daily as needed for constipation  30 capsule  6  . famotidine (PEPCID) 20 MG tablet Take 1 tablet (20 mg total) by mouth 2 (two) times daily.  60 tablet  6  . medroxyPROGESTERone (DEPO-PROVERA) 150 MG/ML injection Inject 150 mg into the muscle. Gets from [facility]       . metoprolol (TOPROL-XL) 50 MG 24 hr tablet Take 1 tablet (50 mg total) by mouth daily.  30 tablet  6  . PARoxetine (PAXIL) 30 MG tablet Take 30 mg by mouth. 2 by mouth at bedtime       . polyethylene glycol (MIRALAX / GLYCOLAX) packet 17g by mouth once daily as needed for constipation  100 each  6  . simethicone (MYLICON) 125 MG chewable tablet 1 tab by mouth daily as needed for gas  30 tablet  6  .  Blood Glucose Monitoring Suppl (PRODIGY AUTOCODE BLOOD GLUCOSE) DEVI by Does not apply route. Use as directed       . glucose blood (ACCU-CHEK AVIVA) test strip Use as directed  100 each  6  . Lancets (ACCU-CHEK MULTICLIX) lancets Use as instructed  1 each  6     Past Medical History  Diagnosis Date  . Psychic disease   . Schizoaffective disorder   . Patient overweight   . Anemia     ROS:   All systems reviewed and negative except as noted in the HPI.   Past Surgical History  Procedure Date  . Breast reduction surgery 08/10/2005  . Hysteroscopy 09/09/2004    w/ removal of polyps     Family History  Problem Relation Age of Onset  . Schizophrenia Sister      History   Social History  . Marital Status: Single    Spouse Name: N/A    Number of Children: N/A  . Years of Education: N/A   Occupational History  . Not on file.   Social History Main Topics  . Smoking status: Never Smoker   . Smokeless tobacco: Not on file  . Alcohol Use: No  .  Drug Use: No  . Sexually Active: Not on file   Other Topics Concern  . Not on file   Social History Narrative   Caregiver is Vladimir Crofts. Lives in [facility] [place] [phone]. Cell [phone]/Previously lived in group home. Twin sister with similar problem list. Has 24H care.Case manager is Ewell Poe, [phone].Caregiver works for [employer]. [address] [place], [place] [address]. Rudean Haskell is clinical supervisor, [phone].Tyson Dense is [employer] [place]., [phone] office. Cell [phone].     BP 100/82  Pulse 96  Ht 5\' 4"  (1.626 m)  Wt 174 lb (78.926 kg)  BMI 29.87 kg/m2  Physical Exam:  Well appearing NAD HEENT: Unremarkable Neck:  No JVD, no thyromegally Lymphatics:  No adenopathy Back:  No CVA tenderness Lungs:  Clear with no wheezes, rales, or rhonchi. HEART:  Regular rate rhythm, no murmurs, no rubs,  no clicks Abd:  Soft, obese, with positive bowel sounds, no organomegally, no rebound, no guarding Ext:  2 plus pulses, no edema, no cyanosis, no clubbing Skin:  No rashes no nodules Neuro:  CN II through XII intact, motor grossly intact  EKG Normal sinus rhythm with a short PR interval. No ventricular preexcitation is present.  Assess/Plan:

## 2010-12-10 NOTE — Patient Instructions (Signed)
Your physician wants you to follow-up in: 12 months with Dr. Taylor. You will receive a reminder letter in the mail two months in advance. If you don't receive a letter, please call our office to schedule the follow-up appointment.    

## 2010-12-19 ENCOUNTER — Encounter: Payer: Self-pay | Admitting: Dietician

## 2010-12-19 ENCOUNTER — Encounter: Payer: Medicaid Other | Attending: Family Medicine | Admitting: Dietician

## 2010-12-19 DIAGNOSIS — Z713 Dietary counseling and surveillance: Secondary | ICD-10-CM | POA: Insufficient documentation

## 2010-12-19 DIAGNOSIS — E119 Type 2 diabetes mellitus without complications: Secondary | ICD-10-CM | POA: Insufficient documentation

## 2010-12-19 NOTE — Patient Instructions (Signed)
   Continue to walk 2 times each day on a regular basis.  Use the restaurant nutrition facts page to help with keeping your calories to 500 when you eat out.  Continue to eat the less fatty foods and use the baked, grilled, roasted meats and poultry.  Continue to limit the chips and fatty fried snacks.  Try to keep the spirit and help [place] to help you.

## 2010-12-19 NOTE — Progress Notes (Signed)
  Medical Nutrition Therapy:  Appt start time: 1700 end time:  1730.   Assessment:  Primary concerns today: Weight mainaintance and glucose control.  MEDICATIONS:  Taking colace daily. Miralax taken daily. Now taking daily that facilitate regular movements. Has had the opportunity to travel this summer.  She divided up her meals, using 1/2 at the meal and  Eating the other half later for another meal.   DIETARY INTAKE:  Usual eating pattern includes 3 meals and 1-2 snacks per day.  Continues to have issues with wanting to eat large amounts of food and to eat at frequent intervals.  Care taker reports that she continues to want to eat the fattier foods, fried foods and will constantly ask for food.  She has experienced a weight gain of 1.1 lb over the last 3 months.  She verbalizes a joy that she will get her treat of a small bag of chips and a small serving of soda on the 7th of each month.  Goal continues to help guide her in her eating to limit her weight gain.  Usual physical activity:  Started to initate walking twice daily.  Walking up the street and back Total of 3-4 laps.Morning and later evening.  Estimated energy needs: 1400  Calories per day 155-160 g carbohydrates 105-110 g protein 37-39 g fat  Progress Towards Goal(s):  No progress.   Nutritional Diagnosis:  [place]-3.3 Overweight/obesity As related to BMI of 28.7%.  As evidenced by Current weight of 177.3 and continued weight gain.    Intervention:  Nutrition Caretaker is to continue to work with her to keep her within her suggested calorie range. She is to try to help her keep a reign on her constant fixation on food and eating and the pleasure the act of eating provides.  Monitoring/Evaluation:  Dietary intake, exercise, blood glucose levesl, and body weight at follow-up in three months.

## 2010-12-30 ENCOUNTER — Telehealth: Payer: Self-pay | Admitting: Family Medicine

## 2010-12-30 NOTE — Telephone Encounter (Signed)
Lana Fish sent a handwritten letter to my inbox requesting a referral for eye doctor and podiatrist for this pt.  Typically pt's do not need a referral for an annual eye exam.  She should be able to just simply call the optometrist and make the appointment without a referral.  Also, most podiatrist do not require a referral.  If they do request a referral note from her PCP.  I am not sure what foot problems this pt is having. If she is having a foot problem I would be glad to evaluate her and if after I examen her it seems she needs to be referred I would be glad to write a referral to the podiatrist at that time.  Will ask office staff to please call pt and give her this message.

## 2011-01-10 ENCOUNTER — Ambulatory Visit (INDEPENDENT_AMBULATORY_CARE_PROVIDER_SITE_OTHER): Payer: Medicaid Other | Admitting: *Deleted

## 2011-01-10 VITALS — BP 113/83 | HR 92

## 2011-01-10 DIAGNOSIS — Z3049 Encounter for surveillance of other contraceptives: Secondary | ICD-10-CM

## 2011-01-10 MED ORDER — MEDROXYPROGESTERONE ACETATE 150 MG/ML IM SUSP
150.0000 mg | Freq: Once | INTRAMUSCULAR | Status: AC
  Administered 2011-01-10: 150 mg via INTRAMUSCULAR

## 2011-01-15 ENCOUNTER — Ambulatory Visit (INDEPENDENT_AMBULATORY_CARE_PROVIDER_SITE_OTHER): Payer: Medicaid Other | Admitting: Family Medicine

## 2011-01-15 ENCOUNTER — Encounter: Payer: Self-pay | Admitting: Family Medicine

## 2011-01-15 DIAGNOSIS — S91209A Unspecified open wound of unspecified toe(s) with damage to nail, initial encounter: Secondary | ICD-10-CM | POA: Insufficient documentation

## 2011-01-15 DIAGNOSIS — S90129A Contusion of unspecified lesser toe(s) without damage to nail, initial encounter: Secondary | ICD-10-CM

## 2011-01-15 DIAGNOSIS — S90119A Contusion of unspecified great toe without damage to nail, initial encounter: Secondary | ICD-10-CM

## 2011-01-15 NOTE — Progress Notes (Signed)
  Subjective:    Patient ID: [name], female    DOB: 05-16-1975, 35 y.o.   MRN: [id number]  HPI Dark colored left great toenail: Pt states she is not sure how long it has been there.  Noticed dark coloration of nail 4 days ago.  No pain at nail. No drainage.  No redness.  No fever.  Has never had in past.  No other nails are affected. No known trauma to nail.  Form completion: Caregiver requests referral form for pt to go see eye doctor. Also requests completion for further diabetes education with [facility], RN, RD, CDE.   Review of Systems    as per above Objective:   Physical Exam  Constitutional: She appears well-developed and well-nourished.  Cardiovascular: Normal rate.   Pulmonary/Chest: Effort normal. No respiratory distress.  Musculoskeletal:       Darkened color-bluish hue- of toe nail of left great toe.  No pain on palpation. No redness. No drainage.  Other toenails thickened but normal color.    Skin: No rash noted.  Psychiatric:       Flat affect per pt baseline.          Assessment & Plan:

## 2011-01-15 NOTE — Assessment & Plan Note (Signed)
It appears that there is bruising under great toenail of left foot.  Since no pain, no redness. No drainage.  No intervention needed at this time.  With time hopefully discoloration will improve.  Pt and caregiver given red flags for return.

## 2011-01-15 NOTE — Patient Instructions (Signed)
The left big toe darkened nail is most likely due to bruising.   I want you to return if any pain, redness, or drainage in this nail. Or if new or worsening of symptoms.

## 2011-01-24 ENCOUNTER — Ambulatory Visit: Payer: Medicaid Other | Admitting: Family Medicine

## 2011-02-17 ENCOUNTER — Ambulatory Visit (INDEPENDENT_AMBULATORY_CARE_PROVIDER_SITE_OTHER): Payer: Medicaid Other | Admitting: Family Medicine

## 2011-02-17 ENCOUNTER — Encounter: Payer: Self-pay | Admitting: Family Medicine

## 2011-02-17 DIAGNOSIS — E1129 Type 2 diabetes mellitus with other diabetic kidney complication: Secondary | ICD-10-CM

## 2011-02-17 DIAGNOSIS — N183 Chronic kidney disease, stage 3 unspecified: Secondary | ICD-10-CM

## 2011-02-17 DIAGNOSIS — F79 Unspecified intellectual disabilities: Secondary | ICD-10-CM

## 2011-02-17 DIAGNOSIS — K5909 Other constipation: Secondary | ICD-10-CM

## 2011-02-17 DIAGNOSIS — Z8679 Personal history of other diseases of the circulatory system: Secondary | ICD-10-CM

## 2011-02-17 DIAGNOSIS — R32 Unspecified urinary incontinence: Secondary | ICD-10-CM

## 2011-02-17 DIAGNOSIS — E663 Overweight: Secondary | ICD-10-CM

## 2011-02-17 DIAGNOSIS — Z0000 Encounter for general adult medical examination without abnormal findings: Secondary | ICD-10-CM

## 2011-02-17 DIAGNOSIS — F209 Schizophrenia, unspecified: Secondary | ICD-10-CM

## 2011-02-17 DIAGNOSIS — S90119A Contusion of unspecified great toe without damage to nail, initial encounter: Secondary | ICD-10-CM

## 2011-02-17 NOTE — Assessment & Plan Note (Signed)
In July 2012 stopped metformin do to elevated creatinine of 1.6. We'll recheck creatinine today. We'll also recheck A1c to see if trending up status post stopping metformin. Patient currently not taking any medications for diabetes. Blood glucose levels trend from 113-170 fasting. Usually trend in the 130s and 140s.

## 2011-02-17 NOTE — Progress Notes (Signed)
  Subjective:    Patient ID: [name], female    DOB: 09/12/1975, 35 y.o.   MRN: [id number]  HPI Patient is here for annual exam:  Health maintenance: Patient has never been sexually active-agrees that Pap smear not needed at this time. Has had Pap in past that was negative. No lumps or bumps or skin changes noted by patient in breast.  Diabetes: Blood sugars in the morning range from 113-170. Usually trend in the 130s to 140s. Had appointment with eye doctor in October 2012. Had dental appointment already this year 2012. Has to be prompted for foot exams. Walking daily. On strict low-carb diet by caregiver.  Obesity: Working on [employer] loss-diet following diet and exercise plan above. Has lost 3 pounds since last appointment.  Schizophrenia: Followed by [facility]-Linda Garringer telephone [phone] and caregiver state this is under good control currently. On clozapine and cogentin and depakote these are prescribed and monitored by [facility] provider.  Constipation: Well controlled on current bowel regimen.   Chronic kidney disease: Patient agrees to lab work to check renal function.   Review of Systems  as per above    Objective:   Physical Exam  Constitutional: She is oriented to person, place, and time. She appears well-developed and well-nourished.       Obese  Cardiovascular: Normal rate, regular rhythm and normal heart sounds.   No murmur heard. Pulmonary/Chest: Effort normal. No respiratory distress. She has no wheezes.  Abdominal: Soft. She exhibits no distension. There is no tenderness.  Genitourinary: No breast swelling, tenderness, discharge or bleeding.  Musculoskeletal: Normal range of motion. She exhibits no edema.  Neurological: She is oriented to person, place, and time.  Skin: No rash noted.       Scattered moles on face back and chest, all benign appearing.  Psychiatric: She has a normal mood and affect. Her behavior is normal.    Slowed speech pattern-per patient baseline.          Assessment & Plan:

## 2011-02-17 NOTE — Assessment & Plan Note (Signed)
Will continue current bowel regimen of MiraLAX and Colace. Well controlled at this time.

## 2011-02-17 NOTE — Assessment & Plan Note (Signed)
Followed by Anselm Lis, NP-telephone [phone]. For both schizophrenia and mental retardation-on Cogentin and Depakote and clozapin-these are prescribed and monitored by Vladimir Faster NP.

## 2011-02-17 NOTE — Assessment & Plan Note (Signed)
Followed by [facility]-Linda Garringer, NP-telephone #[phone]. For both schizophrenia and mental retardation-on Cogentin and Depakote and clozapin-these are prescribed and monitored by Linda Garringer NP. 

## 2011-02-17 NOTE — Patient Instructions (Addendum)
Return for fasting lab work- make an appointment with the lab  Return in 6 months for recheck.  I will mail you the results from your lab work.

## 2011-02-17 NOTE — Assessment & Plan Note (Addendum)
Seen by cardiologist Dr. Deanne Coffer 12/10/2010-symptoms well controlled on beta blocker. He had discussed the possibility of stopping this with caregiver and patient, but did decide to continue this medication since she has had no side effects and her palpitations have been well-controlled.-Heart rate 93 today.

## 2011-02-17 NOTE — Assessment & Plan Note (Signed)
Has had 3 episodes of nighttime incontinence during the past year. Not a current active issue.

## 2011-02-17 NOTE — Assessment & Plan Note (Signed)
Pt to return for fasting labs Will obtain basic metabolic panel at that time to recheck creatinine.

## 2011-02-17 NOTE — Assessment & Plan Note (Signed)
No pain at left great toe, no signs of infection, no blunt trauma to toe-the patient does pick at toenails. This maybe causing bleeding underneath the nails. Reviewed signs and symptoms of infection and red flags for return.

## 2011-02-17 NOTE — Assessment & Plan Note (Signed)
Patient to continue daily walking and low-carb diet as provided by caregiver. Patient to return for weight check in 6 months or sooner if desired.

## 2011-03-10 ENCOUNTER — Other Ambulatory Visit (INDEPENDENT_AMBULATORY_CARE_PROVIDER_SITE_OTHER): Payer: Medicaid Other

## 2011-03-10 ENCOUNTER — Ambulatory Visit (INDEPENDENT_AMBULATORY_CARE_PROVIDER_SITE_OTHER): Payer: Medicaid Other | Admitting: Family Medicine

## 2011-03-10 VITALS — BP 111/78 | HR 99 | Temp 97.9°F | Ht 65.0 in | Wt 174.5 lb

## 2011-03-10 DIAGNOSIS — S91109A Unspecified open wound of unspecified toe(s) without damage to nail, initial encounter: Secondary | ICD-10-CM

## 2011-03-10 DIAGNOSIS — R059 Cough, unspecified: Secondary | ICD-10-CM

## 2011-03-10 DIAGNOSIS — E663 Overweight: Secondary | ICD-10-CM

## 2011-03-10 DIAGNOSIS — S91209A Unspecified open wound of unspecified toe(s) with damage to nail, initial encounter: Secondary | ICD-10-CM

## 2011-03-10 DIAGNOSIS — E1129 Type 2 diabetes mellitus with other diabetic kidney complication: Secondary | ICD-10-CM

## 2011-03-10 DIAGNOSIS — Z23 Encounter for immunization: Secondary | ICD-10-CM

## 2011-03-10 DIAGNOSIS — R05 Cough: Secondary | ICD-10-CM | POA: Insufficient documentation

## 2011-03-10 LAB — POCT GLYCOSYLATED HEMOGLOBIN (HGB A1C): Hemoglobin A1C: 6.4

## 2011-03-10 LAB — COMPREHENSIVE METABOLIC PANEL
ALT: 12 U/L (ref 0–35)
AST: 15 U/L (ref 0–37)
Albumin: 3.8 g/dL (ref 3.5–5.2)
Calcium: 9.6 mg/dL (ref 8.4–10.5)
Chloride: 112 mEq/L (ref 96–112)
Potassium: 4.4 mEq/L (ref 3.5–5.3)

## 2011-03-10 LAB — LIPID PANEL: LDL Cholesterol: 70 mg/dL (ref 0–99)

## 2011-03-10 MED ORDER — BENZONATATE 100 MG PO CAPS: 100.0000 mg | ORAL_CAPSULE | Freq: Four times a day (QID) | ORAL | Status: DC | PRN

## 2011-03-10 NOTE — Progress Notes (Signed)
FLP,CMP AND A1C DONE TODAY [name]

## 2011-03-10 NOTE — Progress Notes (Signed)
  Subjective:    Patient ID: [name], female    DOB: 07-04-1975, 35 y.o.   MRN: [id number]  HPI  toenail avulsion- previous injury.., complete avulsion.  No pain or bleeding or redness.    Congestion and cough- runny nose, cough since Saturday.  No fevers or chills. No sick contacts. Has been using Robitussin with good relief. Can only use Robitussin x5 days per standing orders.  Review of Systems    Denies CP, SOB, HA, N/V/D, fever  Objective:   Physical Exam  Vital signs reviewed General appearance - alert, well appearing, and in no distress and oriented to person, place, and time Eyes - pupils equal and reactive, extraocular eye movements intact, sclera anicteric Ears - bilateral TM's and external ear canals normal, right ear normal, left ear normal Nose - normal and patent, no erythema, discharge or polyps Throat-no redness no postnasal drip. Heart - normal rate, regular rhythm, normal S1, S2, no murmurs, rubs, clicks or gallops Chest - clear to auscultation, no wheezes, rales or rhonchi, symmetric air entry, no tachypnea, retractions or cyanosis       Assessment & Plan:

## 2011-03-10 NOTE — Assessment & Plan Note (Signed)
Begin as bruising. Toenail avulsion.    Small skin abraion on nailbed.  Neosporin to that spot.

## 2011-03-10 NOTE — Assessment & Plan Note (Signed)
Viral cough. No worrisome signs. Will treat with Tessalon Perles or over-the-counter treatments.

## 2011-03-10 NOTE — Patient Instructions (Signed)
Your toenail bed looks just fine. You can put a little bit of triple antibiotic on that spot on it. I think it will grow back just fine. For your cough-you can either use the Robitussin or you can use the Tessalon Perles I am giving you. Viral cough can last up to 2 weeks.

## 2011-03-12 ENCOUNTER — Encounter: Payer: Self-pay | Admitting: Family Medicine

## 2011-03-25 ENCOUNTER — Telehealth: Payer: Self-pay | Admitting: Family Medicine

## 2011-03-25 NOTE — Telephone Encounter (Signed)
[name] dropped off "long term care" forms to be filled out by Dr. Edmonia James.

## 2011-03-25 NOTE — Telephone Encounter (Signed)
FL-2 form placed in Dr. Tobias Alexander box for completion.  Ileana Ladd

## 2011-03-28 ENCOUNTER — Ambulatory Visit (INDEPENDENT_AMBULATORY_CARE_PROVIDER_SITE_OTHER): Payer: Medicaid Other | Admitting: *Deleted

## 2011-03-28 ENCOUNTER — Encounter: Payer: Self-pay | Admitting: *Deleted

## 2011-03-28 VITALS — BP 116/72

## 2011-03-28 DIAGNOSIS — Z3049 Encounter for surveillance of other contraceptives: Secondary | ICD-10-CM

## 2011-03-28 MED ORDER — MEDROXYPROGESTERONE ACETATE 150 MG/ML IM SUSP
150.0000 mg | Freq: Once | INTRAMUSCULAR | Status: AC
  Administered 2011-03-28: 150 mg via INTRAMUSCULAR

## 2011-03-28 NOTE — Progress Notes (Signed)
Addended by: Lynnell Dike on: 03/28/2011 10:12 AM   Modules accepted: Level of Service

## 2011-04-02 ENCOUNTER — Encounter: Payer: Self-pay | Admitting: Family Medicine

## 2011-04-05 NOTE — Telephone Encounter (Signed)
The FL2 form was completed on November 21st and placed in the "to be called" pile in the front office.

## 2011-05-21 ENCOUNTER — Other Ambulatory Visit: Payer: Self-pay | Admitting: Family Medicine

## 2011-05-21 NOTE — Telephone Encounter (Signed)
Refill request

## 2011-06-10 ENCOUNTER — Encounter: Payer: Medicaid Other | Attending: Family Medicine | Admitting: Dietician

## 2011-06-10 DIAGNOSIS — E663 Overweight: Secondary | ICD-10-CM | POA: Insufficient documentation

## 2011-06-10 DIAGNOSIS — N058 Unspecified nephritic syndrome with other morphologic changes: Secondary | ICD-10-CM | POA: Insufficient documentation

## 2011-06-10 DIAGNOSIS — E1129 Type 2 diabetes mellitus with other diabetic kidney complication: Secondary | ICD-10-CM | POA: Insufficient documentation

## 2011-06-10 DIAGNOSIS — Z713 Dietary counseling and surveillance: Secondary | ICD-10-CM | POA: Insufficient documentation

## 2011-06-10 NOTE — Progress Notes (Signed)
  Medical Nutrition Therapy:  Appt start time: 1700 end time:  1730.  Assessment:  Primary concerns today: Follow-up appointment today for weight loss and glucose control.  Weight today is 171.3 lb.  A loss of 6 lb since her last appointment on 12/19/2010.  This she is very pleased.  Her blood glucose is being monitored fasting on a daily basis.  The levels range: 154-157-121-187-127-141 as examples during the month of December.  She is no longer taking Metformin.  She appears to remain cooperative with her her provider and does take a great deal of coaching in trying to achieve her goals of stabilizing her blood glucose and loosing weight.  Continues to have issues with constipation.  She is on stool softners and her care giver reports that they try to increase her water intake, have salads and vegetables and fruit on a regular basis.   MEDICATIONS: Reviewed and noted  DIETARY INTAKE:  24-hr recall:  B (8:00-8:30 AM): Uses crystal light and water to drink. 2 Scrable (eggs, cheese, cheese sauce, sausage and a pastry) OR cereal with milk and a banana. Snk (mid- AM) :Not as a rule  L (12:30-1:00 PM): [place] sandwich with 2 whole wheat breads, fruit cup, Crystal Light to drink.  Snk (Mid- PM): +/-  Depends on the day.  Zaul Hubers have a snack of fruit, or PB crackers. D (5:00 PM): Shrimp, side salad, canned peaches, small slice of lemon pie and Crystal Light and Water.  Note the pie was a special occasion.  Snk (evening PM): Byrne Capek have an individual veggie tray with small serving of dip, fruit (canned or fresh) diet drink and popcorn. Beverages: Crystal Light, water.  Helping with the cooking the meals, ironing.  Recent physical activity: Walk at the day program and will walk in the neighborhood with her roommate and the provider.  Estimated energy needs: 1400 calories 155-160 g carbohydrates 105-110 g protein 37-38 g fat  Progress Towards Goal(s):  Some progress.   Nutritional Diagnosis:  [place]-2.1  Inpaired nutrition utilization As related to when not supervised will consume large amounts of food and present with weight gain and elevated blood glucose levels..  As evidenced by fasting blood glucose levels of 121-150's and a diagnosis of DM type 2..    Intervention:  Nutrition continue to to moderate her food intake, especially the carbohydrate foods.  At each meal needs a selection of lean meats and proteins, limited sweets and starchy carbohydrates and fats. Continue to monitor portions, maintain an appropriate size to provide adequate energy without weight gain and elevated blood glucose levels.  Handouts given during visit include:  No handouts were provided at this visit.  Monitoring/Evaluation:  Dietary intake, exercise, blood glucose levels, and body weight in three months.

## 2011-06-11 ENCOUNTER — Encounter: Payer: Self-pay | Admitting: Dietician

## 2011-06-13 ENCOUNTER — Ambulatory Visit: Payer: Medicaid Other

## 2011-06-20 ENCOUNTER — Ambulatory Visit (INDEPENDENT_AMBULATORY_CARE_PROVIDER_SITE_OTHER): Payer: Medicaid Other | Admitting: *Deleted

## 2011-06-20 VITALS — BP 115/82 | HR 88 | Temp 98.2°F | Ht 66.0 in | Wt 174.4 lb

## 2011-06-20 DIAGNOSIS — Z3049 Encounter for surveillance of other contraceptives: Secondary | ICD-10-CM

## 2011-06-20 MED ORDER — MEDROXYPROGESTERONE ACETATE 150 MG/ML IM SUSP
150.0000 mg | Freq: Once | INTRAMUSCULAR | Status: AC
  Administered 2011-06-20: 150 mg via INTRAMUSCULAR

## 2011-07-23 ENCOUNTER — Ambulatory Visit (INDEPENDENT_AMBULATORY_CARE_PROVIDER_SITE_OTHER): Payer: Medicaid Other | Admitting: Family Medicine

## 2011-07-23 ENCOUNTER — Encounter: Payer: Self-pay | Admitting: Family Medicine

## 2011-07-23 VITALS — BP 111/78 | HR 90 | Ht 62.0 in | Wt 167.0 lb

## 2011-07-23 DIAGNOSIS — N058 Unspecified nephritic syndrome with other morphologic changes: Secondary | ICD-10-CM

## 2011-07-23 DIAGNOSIS — K5909 Other constipation: Secondary | ICD-10-CM

## 2011-07-23 DIAGNOSIS — E1129 Type 2 diabetes mellitus with other diabetic kidney complication: Secondary | ICD-10-CM

## 2011-07-23 DIAGNOSIS — E119 Type 2 diabetes mellitus without complications: Secondary | ICD-10-CM

## 2011-07-23 LAB — POCT GLYCOSYLATED HEMOGLOBIN (HGB A1C): Hemoglobin A1C: 6.3

## 2011-07-23 MED ORDER — POLYETHYLENE GLYCOL 3350 17 G PO PACK: 17.0000 g | PACK | Freq: Every day | ORAL | Status: DC

## 2011-07-23 NOTE — Patient Instructions (Addendum)
Diabetes: Work on walking exercising daily.  Eat healthy snacks.

## 2011-07-23 NOTE — Progress Notes (Signed)
  Subjective:    Patient ID: [name], female    DOB: Jul 24, 1975, 36 y.o.   MRN: [id number]  HPI Diabetes followup: Hemoglobin A1c 6.3 today. Patient is on a strict diet 1500-calorie diet by caregiver. Does eat healthy snacks. Up-to-date on eye exam. Up-to-date on dental exam. Patient is not exercising. Fasting blood sugar ranges from 93-182. Has not noticed any foot wounds or ulcers.  Constipation: Caregiver would like to change MiraLAX to daily schedule. Seems to do better when she gets the MiraLAX daily. Is on should on the MiraLAX as well as Colace daily patient seems to have regular soft bowel movements. No constipation currently--but has had a lot of problems when she is off her bowel regimen. No dark stools. No blood in stools.   Review of Systems As per above.    Objective:   Physical Exam  Constitutional: She appears well-developed and well-nourished.  HENT:  Head: Normocephalic and atraumatic.  Cardiovascular: Normal rate, regular rhythm and normal heart sounds.   No murmur heard. Pulmonary/Chest: Effort normal and breath sounds normal. No respiratory distress. She has no wheezes. She has no rales.  Abdominal: Soft. She exhibits no distension. There is no tenderness.  Musculoskeletal: She exhibits no edema.       See foot exam.  Neurological: She is alert.  Skin: No rash noted.          Assessment & Plan:

## 2011-07-25 ENCOUNTER — Other Ambulatory Visit: Payer: Self-pay | Admitting: Family Medicine

## 2011-07-25 NOTE — Telephone Encounter (Signed)
Refill request

## 2011-07-29 NOTE — Assessment & Plan Note (Signed)
Some mild improvement in weight. Caregiver trying to help patient make healthy food choices and healthy snacks. Encouraged increasing exercise.   Foot exam wnl today.  Up to date on dental and eye exam.  A1C 6.3 today.  Pt to return in 3 months for weight recheck and follow up.

## 2011-07-29 NOTE — Assessment & Plan Note (Signed)
Continue bowel regimen of miralax and colace.  Since pt does better per caregiver with miralax 1 dose daily will change from prn to once daily and a 2nd dose prn.

## 2011-07-29 NOTE — Telephone Encounter (Signed)
Refill request

## 2011-07-30 MED ORDER — POLYETHYLENE GLYCOL 3350 17 G PO PACK: 17.0000 g | PACK | Freq: Every day | ORAL | Status: DC

## 2011-08-07 ENCOUNTER — Emergency Department (INDEPENDENT_AMBULATORY_CARE_PROVIDER_SITE_OTHER)
Admission: EM | Admit: 2011-08-07 | Discharge: 2011-08-07 | Disposition: A | Discharge status: Home / Self Care | Payer: Medicaid Other | Source: Home / Self Care | Attending: Family Medicine | Admitting: Family Medicine

## 2011-08-07 ENCOUNTER — Encounter (HOSPITAL_COMMUNITY): Payer: Self-pay | Admitting: *Deleted

## 2011-08-07 DIAGNOSIS — S81009A Unspecified open wound, unspecified knee, initial encounter: Secondary | ICD-10-CM

## 2011-08-07 DIAGNOSIS — S91009A Unspecified open wound, unspecified ankle, initial encounter: Secondary | ICD-10-CM

## 2011-08-07 DIAGNOSIS — S81011A Laceration without foreign body, right knee, initial encounter: Secondary | ICD-10-CM

## 2011-08-07 MED ORDER — TETANUS-DIPHTH-ACELL PERTUSSIS 5-2.5-18.5 LF-MCG/0.5 IM SUSP
0.5000 mL | Freq: Once | INTRAMUSCULAR | Status: AC
  Administered 2011-08-07: 0.5 mL via INTRAMUSCULAR

## 2011-08-07 NOTE — Discharge Instructions (Signed)
Keep dry until sat, then remove bandage and apply neosporin ointment twice a day for 5 days then discontinue, you had a tetanus booster, return as needed.

## 2011-08-07 NOTE — ED Provider Notes (Signed)
History     CSN: [id number]  Arrival date & time 08/07/11  1731   First MD Initiated Contact with Patient 08/07/11 1735      Chief Complaint  Patient presents with  . Fall    (Consider location/radiation/quality/duration/timing/severity/associated sxs/prior treatment) Patient is a 36 y.o. female presenting with fall. The history is provided by the patient and a caregiver.  Fall The accident occurred 3 to 5 hours ago. Incident: fell on floor coming down stairs, minor lac to right knee. She fell from a height of 1 to 2 ft. She landed on a hard floor. There was no blood loss. The point of impact was the right knee. The patient is experiencing no pain. She was ambulatory at the scene. The symptoms are aggravated by flexion.    Past Medical History  Diagnosis Date  . Psychic disease   . Schizoaffective disorder   . Patient overweight   . Anemia   . Internal hemorrhoid, bleeding 10/22/2010    Past Surgical History  Procedure Date  . Breast reduction surgery 08/10/2005  . Hysteroscopy 09/09/2004    w/ removal of polyps    Family History  Problem Relation Age of Onset  . Schizophrenia Sister     History  Substance Use Topics  . Smoking status: Never Smoker   . Smokeless tobacco: Not on file  . Alcohol Use: No    OB History    Grav Para Term Preterm Abortions TAB SAB Ect Mult Living                  Review of Systems  Constitutional: Negative.   Musculoskeletal: Negative.     Allergies  Minocycline  Home Medications   Current Outpatient Rx  Name Route Sig Dispense Refill  . ACETAMINOPHEN 500 MG PO TABS Oral Take 500 mg by mouth. As needed for pain     . ADAPALENE 0.1 % EX GEL Topical Apply topically at bedtime. 45 g 6  . ASPIRIN 81 MG PO CHEW Oral Chew 1 tablet (81 mg total) by mouth daily. 30 tablet 11  . BENZTROPINE MESYLATE 1 MG PO TABS Oral Take 1 mg by mouth 2 (two) times daily.      Marland Kitchen PRODIGY AUTOCODE BLOOD GLUCOSE DEVI Does not apply by Does not apply  route. Use as directed     . CLINDAMYCIN PHOSPHATE 1 % EX GEL Topical Apply topically 2 (two) times daily. Apply small amount to affected area twice daily  30 g 6  . CLOZAPINE 100 MG PO TBDP Oral Take 100 mg by mouth. Dissolve four tablets in mouth at bedtime     . DIVALPROEX SODIUM ER 500 MG PO TB24 Oral Take 500 mg by mouth 2 (two) times daily at 10 AM and 5 PM.     . DOCUSATE SODIUM 100 MG PO CAPS  1 tab by mouth daily as needed for constipation 30 capsule 6  . FAMOTIDINE 20 MG PO TABS  TAKE ONE TABLET TWICE DAILY 60 tablet 11  . GLUCOSE BLOOD VI STRP  Use as directed 100 each 6  . ACCU-CHEK MULTICLIX LANCETS MISC  Use as instructed 1 each 6  . MEDROXYPROGESTERONE ACETATE 150 MG/ML IM SUSP Intramuscular Inject 150 mg into the muscle. Gets from [facility]     . METOPROLOL SUCCINATE ER 50 MG PO TB24 Oral Take 1 tablet (50 mg total) by mouth daily. 30 tablet 6  . PAROXETINE HCL 30 MG PO TABS Oral Take 30 mg by mouth.  2 by mouth at bedtime     . POLYETHYLENE GLYCOL 3350 PO PACK Oral Take 17 g by mouth daily. At bedtime.  Can increase to 2 x per day if any constipation 100 each 6  . SIMETHICONE 125 MG PO CHEW  1 tab by mouth daily as needed for gas 30 tablet 6    BP 100/66  Pulse 100  Temp(Src) 98.5 F (36.9 C) (Oral)  Resp 18  SpO2 98%  Physical Exam  Vitals reviewed. Constitutional: She appears well-developed and well-nourished.  Musculoskeletal: Normal range of motion. She exhibits no edema and no tenderness.  Neurological: She is alert.  Skin: Skin is warm and dry.       Superficial skin abrasion over right knee, approx 2cm in length. No sub-q tissue exposed, no bleeding., full rom.    ED Course  Procedures (including critical care time)  Labs Reviewed - No data to display No results found.   1. Laceration of knee, right       MDM  Wound care and tdap given.        Linna Hoff, MD 08/07/11 1919

## 2011-08-07 NOTE — ED Notes (Addendum)
Pt  Felled  Down  Some  Steps  Today  Sustaining a  Laceration to  Her  r  Knee       The  Bleeding  Has   Subsided          No  Obvious deformity   Noted          Ambulatory    -  Unsure of her  Last tet  Shot

## 2011-09-05 ENCOUNTER — Ambulatory Visit (INDEPENDENT_AMBULATORY_CARE_PROVIDER_SITE_OTHER): Payer: Medicaid Other | Admitting: Obstetrics and Gynecology

## 2011-09-05 VITALS — BP 120/85 | HR 91 | Temp 96.3°F | Wt 167.6 lb

## 2011-09-05 DIAGNOSIS — Z3049 Encounter for surveillance of other contraceptives: Secondary | ICD-10-CM

## 2011-09-05 MED ORDER — MEDROXYPROGESTERONE ACETATE 150 MG/ML IM SUSP
150.0000 mg | Freq: Once | INTRAMUSCULAR | Status: AC
  Administered 2011-09-05: 150 mg via INTRAMUSCULAR

## 2011-09-08 ENCOUNTER — Encounter: Payer: Medicaid Other | Attending: Family Medicine | Admitting: Dietician

## 2011-09-08 ENCOUNTER — Encounter: Payer: Self-pay | Admitting: Dietician

## 2011-09-08 VITALS — Ht 66.0 in | Wt 170.0 lb

## 2011-09-08 DIAGNOSIS — Z713 Dietary counseling and surveillance: Secondary | ICD-10-CM | POA: Insufficient documentation

## 2011-09-08 DIAGNOSIS — E1129 Type 2 diabetes mellitus with other diabetic kidney complication: Secondary | ICD-10-CM

## 2011-09-08 DIAGNOSIS — E663 Overweight: Secondary | ICD-10-CM | POA: Insufficient documentation

## 2011-09-08 NOTE — Progress Notes (Signed)
  Medical Nutrition Therapy:  Appt start time: 1630 end time:  1700.   Assessment:  Primary concerns today: Follow-up for weight management and glucose levels. Refusing to exercise.  She continues to ask for food at school.  Will usually take the moderate portions and accepts at the meals at home.  Yet, she will continually ask for food and at times when with the provider, will argue about her need for food and the fact that she feels she does not need to exercise.  She has as a reward for exercise participation, the treat of on the 7th day of the month she is able to have her bag of chips and a diet drink.  Her provider questions if she should continue to get the treat if she is not exercising.  I agreed that this is an issue to discuss with her MD who initiated the process and or the treatment team. Renatta has lost 1.3 lb since her last visit.  She continues her same eating and snacking habits.  She will accept the portions provided and looks forward to meals.  But, she is not above getting food at her school.    BLOOD GLUCOSE LEVELS;  Fasting: 110,130's 150 142,134,172,151,135,142,120,129,122.  Last HgA1C= 6.3%   MEDICATIONS: Med review completed.  Currently on no medications for glucose control.   DIETARY INTAKE: 24-hr recall:  B ( AM): 8:00 milk 2%, crystal light , water to drink Many have the oatmea with a boiled egg  OR the cereal (raisin bran, corn flakes, trying to use a cereal with fiber to deal with her issues of constipation).     Snk ( AM): none  L ( PM): 12:30 -1:45  Sandwich; fish with cheese, on bun, yogurt (lite with sugar sub) crystal light to drink Snk ( PM):  Not usually D ( PM): 5:00 Pot pie,  Vegetables, water or milk to drink Snk ( PM): Chicken salad sandwich.   Beverages: water, crystal light, milk  Usual physical activity: Not exercising.  "Just don't feel like it" .  Lays down a lot, everry time she gets the opportunity.  No manner of cajoling was I able to get her to  agree to just walk in her neighborhood.  She did verbalize that she might walk if she could be taken to the track at the [facility] which is a 15 mile commute from her house.  Estimated energy needs:HT:66 in  WT: 170 lb  BMI: 27.5 kg/m2  Adj Wt: 143-145 (65 kg) 1400 calories 155-160 g carbohydrates 105-110 g protein 37-38 g fat  Progress Towards Goal(s):  Some progress.   Nutritional Diagnosis:  [place]-2.1 Inpaired nutrition utilization As related to glucose.  As evidenced by When not supervised, will consume large amounts of sweetened [place] and foods, resulting in higher blood glucose levels and weight gain..    Intervention:  Nutrition Try to restrict the food/carbs as much as you can.  If she is not going to exercise, then limiting the food intake as best you can, will help with maintaining the current weight.    Handouts given during visit include:None at this time.  Monitoring/Evaluation:  Dietary intake, exercise, blood glucose levels, and body weight in three months.  Group home provider is to call for appointment.

## 2011-09-09 ENCOUNTER — Encounter: Payer: Self-pay | Admitting: Dietician

## 2011-09-30 ENCOUNTER — Other Ambulatory Visit: Payer: Self-pay | Admitting: Family Medicine

## 2011-10-09 ENCOUNTER — Other Ambulatory Visit: Payer: Self-pay | Admitting: Family Medicine

## 2011-10-30 ENCOUNTER — Encounter: Payer: Self-pay | Admitting: Family Medicine

## 2011-10-30 ENCOUNTER — Ambulatory Visit (INDEPENDENT_AMBULATORY_CARE_PROVIDER_SITE_OTHER): Payer: Medicaid Other | Admitting: Family Medicine

## 2011-10-30 VITALS — BP 108/66 | HR 64 | Temp 98.3°F | Ht 66.0 in | Wt 169.0 lb

## 2011-10-30 DIAGNOSIS — E663 Overweight: Secondary | ICD-10-CM

## 2011-10-30 DIAGNOSIS — N058 Unspecified nephritic syndrome with other morphologic changes: Secondary | ICD-10-CM

## 2011-10-30 DIAGNOSIS — E1129 Type 2 diabetes mellitus with other diabetic kidney complication: Secondary | ICD-10-CM

## 2011-10-30 LAB — POCT GLYCOSYLATED HEMOGLOBIN (HGB A1C): Hemoglobin A1C: 6

## 2011-10-30 NOTE — Patient Instructions (Addendum)
Diabetes: Walk daily. Continue to try to restrict carbohydrates.   Hearing test: Will have this completed today.   Weight loss: Continue to work on healthy eating and exercise.   Return in 3 months

## 2011-10-30 NOTE — Progress Notes (Signed)
  Subjective:    Patient ID: [name], female    DOB: 1975/07/22, 36 y.o.   MRN: [id number]  HPI Diabetes followup: Patient living with caregiver who tries to encourage low carb diet. The patient likes high carb to another hard time restricting herself. Patient not exercising regularly currently. Not on any medications for diabetes. Checking fasting blood sugar daily. Usually in low 100s occasionally goes to 150s. No readings over 200. No symptoms of hyper or hypoglycemia.  Hearing test: Per patient caregiver-hearing test as required by Medicaid  Yearly.  Requesting a hearing test be done today. No obvious problems with hearing. Seem to responding well when asked questions or told commands.  Weight management: As per above patient struggles with portion control into the healthy foods. Patient also has a hard time getting motivated to exercise. Caregiver prompts patient and encouraged her to go outside and walk. No chest pain. No shortness of breath.  Smoking status reviewed.    Review of Systems As per above.    Objective:   Physical Exam  Constitutional: She appears well-developed and well-nourished.  HENT:  Head: Normocephalic and atraumatic.  Eyes: Pupils are equal, round, and reactive to light. Right eye exhibits no discharge. Left eye exhibits no discharge.  Neck: Neck supple. No thyromegaly present.  Cardiovascular: Normal rate, regular rhythm and normal heart sounds.   No murmur heard. Pulmonary/Chest: Effort normal. No respiratory distress. She has no wheezes. She has no rales.  Abdominal: Soft. She exhibits no distension.  Musculoskeletal: She exhibits no edema.  Neurological: She is alert.  Skin: No rash noted.  Psychiatric: She has a normal mood and affect.          Assessment & Plan:

## 2011-11-01 NOTE — Assessment & Plan Note (Signed)
Encouraged patient to increase physical activity- in particular walking.  Also encouraged her to listen to caregiver when she tries to guide her with making healthy food choices.

## 2011-11-01 NOTE — Assessment & Plan Note (Signed)
Reviewed record today- has had creatinine of 1.5 since 2008- not sure what it was prior to that.  Pt has chronic kidney disease- not sure if she has ever seen a renal MD.  At next appointment will be due for creatinine recheck.  Also can discuss with patient and caregiver about possible renal referral- to see if this is something they feel that they would like to do- or if we should continue to monitor at the primary care office.  Goals of therapy is to maintain tight BG and BP control and avoid nephrotoxic medications.

## 2011-11-01 NOTE — Assessment & Plan Note (Signed)
A1C 6.4.  On no diabetes medications currently.  Pt to increase activity and work on [employer].  Foot exam wnl limits today- see flowsheet.

## 2011-11-10 ENCOUNTER — Other Ambulatory Visit: Payer: Self-pay | Admitting: Family Medicine

## 2011-11-17 ENCOUNTER — Ambulatory Visit (INDEPENDENT_AMBULATORY_CARE_PROVIDER_SITE_OTHER): Payer: Medicaid Other | Admitting: Family Medicine

## 2011-11-17 ENCOUNTER — Encounter: Payer: Self-pay | Admitting: Family Medicine

## 2011-11-17 VITALS — BP 110/72 | HR 88 | Ht 66.0 in | Wt 172.2 lb

## 2011-11-17 DIAGNOSIS — N058 Unspecified nephritic syndrome with other morphologic changes: Secondary | ICD-10-CM

## 2011-11-17 DIAGNOSIS — L709 Acne, unspecified: Secondary | ICD-10-CM

## 2011-11-17 DIAGNOSIS — K5909 Other constipation: Secondary | ICD-10-CM

## 2011-11-17 DIAGNOSIS — L708 Other acne: Secondary | ICD-10-CM

## 2011-11-17 DIAGNOSIS — E1129 Type 2 diabetes mellitus with other diabetic kidney complication: Secondary | ICD-10-CM

## 2011-11-17 LAB — BASIC METABOLIC PANEL
CO2: 25 mEq/L (ref 19–32)
Chloride: 110 mEq/L (ref 96–112)
Creat: 1.58 mg/dL — ABNORMAL HIGH (ref 0.50–1.10)
Potassium: 4.3 mEq/L (ref 3.5–5.3)
Sodium: 142 mEq/L (ref 135–145)

## 2011-11-17 MED ORDER — ADAPALENE 0.1 % EX GEL: Freq: Every day | CUTANEOUS | Status: DC

## 2011-11-17 MED ORDER — CLINDAMYCIN PHOSPHATE 1 % EX GEL: Freq: Every day | CUTANEOUS | Status: DC

## 2011-11-17 NOTE — Progress Notes (Signed)
Patient ID: [name], female   DOB: [birth year], 36 y.o.   MRN: [id number]  HPI  ACNE: Caregiver present and provides much of the history. Needs refills on differin clindamycin for acne. Has been using for at least three years. Has no problems with using the medications. No recent breakouts.  DIABETES: Last saw Dr. Edmonia James a few weeks ago. A1c was 6.0. Did not have BMET drawn at that time. Pt is supposed to walk every day but doesn't always. Blood sugars have been really good per caregiver, all between 100-200. Not on any diabetes meds currently.   STOOLS: Pt thinks she is having too many bowel movements. They are formed stools, not diarrhea. Takes Colace and Miralax. In the past when she has not taken the Miralax she had constipation and problems with rectal bleeding from a hemorrhoid.   ROS See HPI  PMH Schizoaffective d/o MR Diabetes - no meds currently, previously on metformin Renal disease - last BMET in Oct 2012, creatinine 1.58 then.  PHYSICAL EXAM Gen: NAD, pleasant, cooperative Heart: RRR, no M/R/G Lungs: CTAB Skin: Acne scarring present throughout facial skin. 1-2 breakouts present. Psych: Denies SI/HI. No auditory hallucinations, says she sometimes has visual hallucinations but they do not bother her. Does not appear to be responding to internal stimuli.

## 2011-11-17 NOTE — Assessment & Plan Note (Addendum)
Check BMET today.   ADDENDUM 11/18/2011 1300: Creatinine 1.58; same value as 8 months ago. Will continue to follow with periodic BMETS.

## 2011-11-17 NOTE — Assessment & Plan Note (Signed)
A1c 6.0 in June 2013. Did not check BMET then, so will check it today to monitor renal function as pt has hx of elevated Cr. Discussed diet and exercise with pt and caregiver. Not on any diabetes medications at home.

## 2011-11-17 NOTE — Assessment & Plan Note (Signed)
Has used differin and clindamycin gel for >3 years, needs refills. No problems with medications. Will give printed prescription today for both of these.

## 2011-11-17 NOTE — Patient Instructions (Signed)
Use clindamycin and differin once per day.   I will see you back in 2-3 months.   Keep walking every day and eating healthy!

## 2011-11-17 NOTE — Assessment & Plan Note (Signed)
Pt complains of frequency of stooling (~3/day), but stools are formed and non diarrheal per caregiver. Will continue miralax and colace and consider changing regimen at next visit if pt is still bothered by stool frequency. Pt and caregiver agree to this plan.

## 2011-11-18 ENCOUNTER — Encounter: Payer: Self-pay | Admitting: Family Medicine

## 2011-11-27 ENCOUNTER — Ambulatory Visit: Payer: Medicaid Other | Admitting: Family Medicine

## 2011-12-26 ENCOUNTER — Ambulatory Visit (INDEPENDENT_AMBULATORY_CARE_PROVIDER_SITE_OTHER): Payer: Medicaid Other | Admitting: *Deleted

## 2011-12-26 VITALS — BP 110/77 | HR 92 | Wt 171.0 lb

## 2011-12-26 DIAGNOSIS — Z3200 Encounter for pregnancy test, result unknown: Secondary | ICD-10-CM

## 2011-12-26 DIAGNOSIS — Z3049 Encounter for surveillance of other contraceptives: Secondary | ICD-10-CM

## 2011-12-26 MED ORDER — MEDROXYPROGESTERONE ACETATE 150 MG/ML IM SUSP
150.0000 mg | Freq: Once | INTRAMUSCULAR | Status: AC
  Administered 2012-08-20: 150 mg via INTRAMUSCULAR

## 2011-12-30 ENCOUNTER — Encounter: Payer: Self-pay | Admitting: Family Medicine

## 2011-12-30 ENCOUNTER — Ambulatory Visit (INDEPENDENT_AMBULATORY_CARE_PROVIDER_SITE_OTHER): Payer: Medicaid Other | Admitting: Family Medicine

## 2011-12-30 VITALS — BP 107/75 | HR 96 | Ht 62.0 in | Wt 173.0 lb

## 2011-12-30 DIAGNOSIS — S139XXA Sprain of joints and ligaments of unspecified parts of neck, initial encounter: Secondary | ICD-10-CM

## 2011-12-30 DIAGNOSIS — K645 Perianal venous thrombosis: Secondary | ICD-10-CM

## 2011-12-30 DIAGNOSIS — S161XXA Strain of muscle, fascia and tendon at neck level, initial encounter: Secondary | ICD-10-CM

## 2011-12-30 MED ORDER — IBUPROFEN 600 MG PO TABS: 600.0000 mg | ORAL_TABLET | Freq: Three times a day (TID) | ORAL | Status: AC | PRN

## 2011-12-30 NOTE — Progress Notes (Signed)
Subjective:     Patient ID: [name], female   DOB: 13-Dec-1975, 36 y.o.   MRN: [id number]  HPI Examined and discussed with MS3, edited  Pt is a 36 yo woman with MR and schizophrenia who presents to clinic with her care giver c/o R shoulder/neck pain and a sore on her bottom  R shoulder neck: -care giver reports 5 day hx of neck and shoulder pain.  Pt describes the pain as "achy".  Caregiver states that she frequently "pops her neck" and believes that her pain is due to this. There is no radiation down the arm and no tingling in the hands.  She is still able to complete her activities of daily living as usual.  There are no known recent traumas or injuries.  She has needed occasional tylenol for pain over past 5 days but states that it did not help.  Care giver reports that they have used hot packs on her neck and shoulder which seem to offer some relief of symptoms.    Rectum: 1 day history of rectal itching.  Caregiver inspected bottom yesterday and noted a dime-sized piece of skin protruding from the rectum.  Pt does have a previous history of hemorrhoids.  Pt and caregiver deny blood in stool, or any other changes in stool frequency or consistency.  Caregiver is concerned about poor hygiene being the cause of her itching.   Review of Systems See HPI     Objective:   Physical Exam MSK: -Spine: mild TTP noted in in lower cervical spine. No other tenderness, no deformity.  Symmetrical ROM.  Mild discomfort with neck flexion -R shoulder: no deformity, no TTP, no crepitus, nml ROM, mild decrease in abduction strength (probably due to guarding), good internal rotation lift-off,  R arm: no deformity, no TTP, nml biceps and triceps strength, nml biceps reflex R hand: no wasting, normal sensation, no TTP, nml strength  Neuro: -normal sensation to fine touch on upper extremities.   Rectum: -thrombosed external hemorrhoid, TTP no other rashes or lesions per Dr. Leonie Green exam      Assessment:     Pt is a 36 yo woman with Hx of MR and schizophrenia with neck strain and thrombosed external hemorrhoid    Plan:     Neck strain: -ibuprofen prescription given -continue warm compresses -avoid aggravating movements/activities  Hemorroid: -pt does not desire excision at this point -pt recommended to do soaking baths -continue miralx and colace to decrease straining during bowel movements

## 2011-12-30 NOTE — Patient Instructions (Addendum)
Use ibuprofen for neck strain.   Hemorrhoids Hemorrhoids are veins in the rectum that get big. These veins can get blocked. Blocked veins become puffy (swollen) and painful. HOME CARE  Eat more fiber.   Drink enough fluid to keep your pee (urine) clear or pale yellow.   Exercise often.   Avoid straining to poop (bowel movement).   Keep the butt area dry and clean.   Only take medicine as told by your doctor.  If your hemorrhoids are puffy and painful:  Take a warm bath for 20 to 30 minutes. Do this 3 to 4 times a day.   Place ice packs on the area. Use the ice packs between the baths.   Put ice in a plastic bag.   Place a towel between your skin and the bag.   Leave the ice on for 15 to 20 minutes, 3 to 4 times a day.   Do not use a donut-shaped pillow. Do not sit on the toilet for a long time.   Go to the bathroom when your body has the urge to poop. This is so you do not strain as much to poop.  GET HELP RIGHT AWAY IF:    You have increasing pain that is not controlled with medicine.   You have uncontrolled bleeding.   You cannot poop.   You have pain or puffiness outside the area of the hemorrhoids.   You have chills.   You have a temperature by mouth above 102 F (38.9 C), not controlled by medicine.  MAKE SURE YOU:    Understand these instructions.   Will watch your condition.   Will get help right away if you are not doing well or get worse.  Document Released: 02/05/2008 Document Revised: 04/17/2011 Document Reviewed: 02/05/2008 [facility] Patient Information 2012 [place], [place].Cervical Sprain A cervical sprain is when the ligaments in the neck stretch or tear. The ligaments are the tissues that hold the neck bones in place. HOME CARE    Put ice on the injured area.   Put ice in a plastic bag.   Place a towel between your skin and the bag.   Leave the ice on for 15 to 20 minutes, 3 to 4 times a day.   Only take medicine as told by your doctor.    Keep all doctor visits as told.   Keep all physical therapy visits as told.   If your doctor gives you a neck collar, wear it as told.   Do not drive while wearing a neck collar.   Adjust your work station so that you have good posture while you work.   Avoid positions and activities that make your problems worse.   Warm up and stretch before being active.  GET HELP RIGHT AWAY IF:    You are bleeding or your stomach is upset.   You have an allergic reaction to your medicine.   Your problems (symptoms) get worse.   You develop new problems.   You lose feeling (numbness) or you cannot move (paralysis) any part of your body.   You have tingling or weakness in any part of your body.   Your pain is not controlled with medicine.   You cannot take less pain medicine over time as planned.   Your activity level does not improve as expected.  MAKE SURE YOU:    Understand these instructions.   Will watch your condition.   Will get help right away if you are not  doing well or get worse.  Document Released: 10/15/2007 Document Revised: 04/17/2011 Document Reviewed: 01/30/2011 [facility] Patient Information 2012 [place], [place].

## 2011-12-31 ENCOUNTER — Ambulatory Visit (INDEPENDENT_AMBULATORY_CARE_PROVIDER_SITE_OTHER): Payer: Medicaid Other | Admitting: *Deleted

## 2011-12-31 VITALS — BP 127/80 | HR 95 | Temp 97.0°F | Wt 173.8 lb

## 2011-12-31 DIAGNOSIS — K645 Perianal venous thrombosis: Secondary | ICD-10-CM | POA: Insufficient documentation

## 2011-12-31 DIAGNOSIS — S161XXA Strain of muscle, fascia and tendon at neck level, initial encounter: Secondary | ICD-10-CM | POA: Insufficient documentation

## 2011-12-31 DIAGNOSIS — Z3049 Encounter for surveillance of other contraceptives: Secondary | ICD-10-CM

## 2011-12-31 LAB — POCT PREGNANCY, URINE
Preg Test, Ur: NEGATIVE
Preg Test, Ur: NEGATIVE

## 2011-12-31 MED ORDER — MEDROXYPROGESTERONE ACETATE 150 MG/ML IM SUSP
150.0000 mg | Freq: Once | INTRAMUSCULAR | Status: AC
  Administered 2011-12-31: 150 mg via INTRAMUSCULAR

## 2011-12-31 NOTE — Assessment & Plan Note (Signed)
Mildly tender.  Does not desire excision.  Discussed stool regimen, supportive care.

## 2011-12-31 NOTE — Assessment & Plan Note (Signed)
Mild trapezius, cervical strain.  Will rx ibuprofen, discussed supportive care, red flags for return.

## 2012-01-02 ENCOUNTER — Encounter: Payer: Self-pay | Admitting: Internal Medicine

## 2012-01-02 ENCOUNTER — Ambulatory Visit (INDEPENDENT_AMBULATORY_CARE_PROVIDER_SITE_OTHER): Payer: Medicaid Other | Admitting: Internal Medicine

## 2012-01-02 VITALS — BP 116/76 | HR 100 | Ht 62.0 in | Wt 173.8 lb

## 2012-01-02 DIAGNOSIS — R000 Tachycardia, unspecified: Secondary | ICD-10-CM

## 2012-01-02 DIAGNOSIS — Z8679 Personal history of other diseases of the circulatory system: Secondary | ICD-10-CM

## 2012-01-02 NOTE — Patient Instructions (Addendum)
Your physician wants you to follow-up in: 12 months with Dr. Taylor. You will receive a reminder letter in the mail two months in advance. If you don't receive a letter, please call our office to schedule the follow-up appointment.    

## 2012-01-02 NOTE — Progress Notes (Signed)
HPI Mrs. [name] returns today for followup. She is a pleasant 36 yo woman with a h/o SVT and a short PR interval. Her symptoms have been fairly well-controlled. She has occasional tachypalpitations. She also has arthritic complaints. No chest pain. No syncope. Allergies  Allergen Reactions  . Minocycline      Current Outpatient Prescriptions  Medication Sig Dispense Refill  . ACCU-CHEK AVIVA PLUS test strip AS DIRECTED  90 strip  4  . acetaminophen (TYLENOL) 500 MG tablet Take 500 mg by mouth. As needed for pain       . adapalene (DIFFERIN) 0.1 % gel Apply topically at bedtime.  45 g  1  . aspirin 81 MG chewable tablet Chew 1 tablet (81 mg total) by mouth daily.  30 tablet  11  . benztropine (COGENTIN) 1 MG tablet Take 1 mg by mouth 2 (two) times daily.        . Blood Glucose Monitoring Suppl (PRODIGY AUTOCODE BLOOD GLUCOSE) DEVI by Does not apply route. Use as directed       . clindamycin (CLINDAGEL) 1 % gel Apply topically daily.  30 g  1  . clozapine (FAZACLO) 100 MG disintegrating tablet Take 100 mg by mouth. Dissolve four tablets in mouth at bedtime       . divalproex (DEPAKOTE) 500 MG 24 hr tablet Take 500 mg by mouth 2 (two) times daily at 10 AM and 5 PM.       . docusate sodium (COLACE) 100 MG capsule 1 tab by mouth daily as needed for constipation  30 capsule  6  . famotidine (PEPCID) 20 MG tablet TAKE ONE TABLET TWICE DAILY  60 tablet  11  . ibuprofen (ADVIL,MOTRIN) 600 MG tablet Take 1 tablet (600 mg total) by mouth every 8 (eight) hours as needed for pain.  30 tablet  0  . medroxyPROGESTERone (DEPO-PROVERA) 150 MG/ML injection Inject 150 mg into the muscle. Gets from [facility]       . metoprolol succinate (TOPROL-XL) 50 MG 24 hr tablet TAKE ONE TABLET EVERY DAY  90 tablet  2  . PARoxetine (PAXIL) 30 MG tablet Take 30 mg by mouth. 2 by mouth at bedtime       . polyethylene glycol (MIRALAX / GLYCOLAX) packet Take 17 g by mouth daily. At bedtime.  Can increase to 2 x per day if any  constipation  100 each  6  . simethicone (MYLICON) 125 MG chewable tablet 1 tab by mouth daily as needed for gas  30 tablet  6   Current Facility-Administered Medications  Medication Dose Route Frequency Provider Last Rate Last Dose  . medroxyPROGESTERone (DEPO-PROVERA) injection 150 mg  150 mg Intramuscular Once Aviva Signs, CNM         Past Medical History  Diagnosis Date  . Psychic disease   . Schizoaffective disorder   . Patient overweight   . Anemia   . Internal hemorrhoid, bleeding 10/22/2010    ROS:   All systems reviewed and negative except as noted in the HPI.   Past Surgical History  Procedure Date  . Breast reduction surgery 08/10/2005  . Hysteroscopy 09/09/2004    w/ removal of polyps     Family History  Problem Relation Age of Onset  . Schizophrenia Sister      History   Social History  . Marital Status: Single    Spouse Name: N/A    Number of Children: N/A  . Years of Education: N/A   Occupational  History  . Not on file.   Social History Main Topics  . Smoking status: Never Smoker   . Smokeless tobacco: Never Used  . Alcohol Use: No  . Drug Use: No  . Sexually Active: Not on file   Other Topics Concern  . Not on file   Social History Narrative   Caregiver is Vladimir Crofts. [place] [phone]. Cell [phone]/Previously lived in group home. Twin sister with similar problem list. Has 24H care.Case manager is Ewell Poe, [phone].Caregiver works for [employer]. [address] [place], [place] [address]. Rudean Haskell is clinical supervisor, [phone].Tyson Dense is [employer] of [place] [employer] [place]., [phone] office. Cell [phone].     BP 116/76  Pulse 100  Ht 5\' 2"  (1.575 m)  Wt 173 lb 12.8 oz (78.835 kg)  BMI 31.79 kg/m2  Physical Exam:  Well appearing 36 year old woman, NAD HEENT: Unremarkable Neck:  No JVD, no  thyromegally Lungs:  Clear with no wheezes, rales, or rhonchi. HEART:  Regular rate rhythm, no murmurs, no rubs, no clicks Abd:  soft, positive bowel sounds, no organomegally, no rebound, no guarding Ext:  2 plus pulses, no edema, no cyanosis, no clubbing Skin:  No rashes no nodules Neuro:  CN II through XII intact, motor grossly intact  EKG Normal sinus rhythm with a short PR interval.  Assess/Plan:

## 2012-01-02 NOTE — Assessment & Plan Note (Signed)
Her SVT has been well-controlled. She will continue her current medical therapy. We'll see her back in a year.

## 2012-02-06 ENCOUNTER — Telehealth: Payer: Self-pay | Admitting: Family Medicine

## 2012-02-06 NOTE — Telephone Encounter (Signed)
Pt needs a paper script for [name] for Colace 100 mg by mouth PRN. Also, needs another referral to Diabetic Nutritionist that she is already seeing.

## 2012-02-06 NOTE — Telephone Encounter (Signed)
Fwd to PCP for Rx. [name], Renato Battles

## 2012-02-10 ENCOUNTER — Other Ambulatory Visit: Payer: Self-pay | Admitting: Family Medicine

## 2012-02-10 ENCOUNTER — Telehealth: Payer: Self-pay | Admitting: Family Medicine

## 2012-02-10 MED ORDER — DOCUSATE SODIUM 100 MG PO CAPS: ORAL_CAPSULE | ORAL | Status: DC

## 2012-02-10 NOTE — Telephone Encounter (Signed)
Called and spoke with [name], Jacqueline's caretaker. Told her I will leave a prescription for colace at the front desk for her to pick up. Also told her that I do not think [name] needs to keep going to the diabetes nutritionist as her a1c was good last time. Will give handwritten script to d/c nutritionist, which caretaker states she will need.

## 2012-02-10 NOTE — Telephone Encounter (Signed)
Will place script at front desk for them to pick up. Called caretaker to let her know. Will d/c diabetes nutritionist as a1c has been good.

## 2012-03-02 ENCOUNTER — Encounter: Payer: Self-pay | Admitting: Family Medicine

## 2012-03-02 ENCOUNTER — Ambulatory Visit (INDEPENDENT_AMBULATORY_CARE_PROVIDER_SITE_OTHER): Payer: Medicaid Other | Admitting: Family Medicine

## 2012-03-02 VITALS — BP 120/74 | HR 82 | Ht 62.0 in | Wt 171.0 lb

## 2012-03-02 DIAGNOSIS — Z0000 Encounter for general adult medical examination without abnormal findings: Secondary | ICD-10-CM

## 2012-03-02 DIAGNOSIS — E119 Type 2 diabetes mellitus without complications: Secondary | ICD-10-CM

## 2012-03-02 DIAGNOSIS — E1129 Type 2 diabetes mellitus with other diabetic kidney complication: Secondary | ICD-10-CM

## 2012-03-02 DIAGNOSIS — Z23 Encounter for immunization: Secondary | ICD-10-CM

## 2012-03-02 LAB — BASIC METABOLIC PANEL
BUN: 20 mg/dL (ref 6–23)
Calcium: 9.4 mg/dL (ref 8.4–10.5)
Creat: 1.75 mg/dL — ABNORMAL HIGH (ref 0.50–1.10)

## 2012-03-02 LAB — POCT GLYCOSYLATED HEMOGLOBIN (HGB A1C): Hemoglobin A1C: 6.2

## 2012-03-02 NOTE — Patient Instructions (Addendum)
Great to see you again today!  Keep working on the [employer] and walking. Your diabetes test looks great today(6.2)!  I will call you if your test results are not normal.  Otherwise, I will send you a letter.  If you do not hear from me with in 2 weeks please call our office.     Schedule a follow up appointment in 3 months.

## 2012-03-03 ENCOUNTER — Encounter: Payer: Self-pay | Admitting: Family Medicine

## 2012-03-03 ENCOUNTER — Telehealth: Payer: Self-pay | Admitting: Family Medicine

## 2012-03-03 DIAGNOSIS — E1129 Type 2 diabetes mellitus with other diabetic kidney complication: Secondary | ICD-10-CM

## 2012-03-03 DIAGNOSIS — Z0000 Encounter for general adult medical examination without abnormal findings: Secondary | ICD-10-CM | POA: Insufficient documentation

## 2012-03-03 MED ORDER — LISINOPRIL 5 MG PO TABS: 5.0000 mg | ORAL_TABLET | Freq: Every day | ORAL | Status: DC

## 2012-03-03 NOTE — Assessment & Plan Note (Signed)
Flu shot given today. Pap smear - appears from chart review that last pap was done on 06/05/2008 and was normal. Pt will be due for pap at next visit.

## 2012-03-03 NOTE — Assessment & Plan Note (Addendum)
A1c 6.2 today (was 6.0 in July); good control of sugars without diabetes medications. Recheck BMET today to monitor renal function.  Foot exam done today - normal Eye exam - has been in last year Renal - check BMET today, consider starting ACE inhibitor Lipids - last lipid panel was October of last year, will need to check at least LDL at next visit (did not notice this until after pt had left today's visit) Cardiac - on aspirin

## 2012-03-03 NOTE — Progress Notes (Signed)
Patient ID: [name], female   DOB: 1975-09-17, 36 y.o.   MRN: [id number]  HPI:  [name] is a 36 y.o. female with a PMH of MR, schizophrenia, and diabetes here with her caretaker to follow up on her chronic medical conditions. Pt has diabetes which is currently just diet controlled. She had previously been going to diabetic nutritionist but because her a1c has been so good we d/c'd that. She is checking her blood sugars daily and always gets between 100 and 200. Caretaker reiterates to her the importance of continuing to eat healthy. 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 Has been to the eye doctor within the last year. Caretaker thinks last pap was within two years ago. Would like a flu shot today.  Moods have been good. Pt does endorse some visual hallucinations but denies thoughts of self harm or harm to others. Caretaker states moods and hallucinations have not been a problem. She is seen by mental health for management of her schizophrenia.  ROS: As per HPI  PMH: Reviewed in chart  Exam: Gen: NAD, pleasant, cooperative Heart: RRR Lungs: CTAB Ext: diabetic foot exam performed, WNL Neuro: cognitively delayed but neuro exam grossly intact

## 2012-03-03 NOTE — Telephone Encounter (Signed)
Called patient and spoke with her caregiver, [name], about results of pt's BMET from yesterday. Creatinine bumped from 1.58 (July) to 1.75.  I want to start her on an ACE inhibitor (lisinopril 5mg ) to help protect her kidneys. She will need a BMET checked one week after starting the medication. I would also like to check her blood pressure at that time to be sure it is not too low with the lisinopril. (At lab appointment, will also check direct LDL as she is due for that to be checked).  Additionally, she should NOT take any NSAIDs (ibuprofen, naproxen, etc) as these could further damage her kidneys. All of this was explained to [name], who stated her understanding. I will leave a printed prescription for her at the front desk to pick up. She will schedule the lab/nurse appointment when she picks it up.

## 2012-03-10 ENCOUNTER — Other Ambulatory Visit: Payer: Self-pay | Admitting: Family Medicine

## 2012-03-12 NOTE — Telephone Encounter (Signed)
Please call pt's caregiver and let her know that I will leave a printed rx for the clindagel at the front desk. Thanks!

## 2012-03-12 NOTE — Telephone Encounter (Signed)
Care giver notified.

## 2012-03-16 ENCOUNTER — Other Ambulatory Visit: Payer: Self-pay | Admitting: Family Medicine

## 2012-03-17 ENCOUNTER — Ambulatory Visit (INDEPENDENT_AMBULATORY_CARE_PROVIDER_SITE_OTHER): Payer: Medicaid Other | Admitting: *Deleted

## 2012-03-17 VITALS — BP 112/69 | HR 97 | Temp 98.7°F | Wt 170.2 lb

## 2012-03-17 DIAGNOSIS — Z3049 Encounter for surveillance of other contraceptives: Secondary | ICD-10-CM

## 2012-03-17 MED ORDER — MEDROXYPROGESTERONE ACETATE 150 MG/ML IM SUSP
150.0000 mg | Freq: Once | INTRAMUSCULAR | Status: AC
  Administered 2012-03-17: 150 mg via INTRAMUSCULAR

## 2012-04-02 ENCOUNTER — Encounter: Payer: Self-pay | Admitting: Family Medicine

## 2012-04-02 ENCOUNTER — Ambulatory Visit (INDEPENDENT_AMBULATORY_CARE_PROVIDER_SITE_OTHER): Payer: Medicaid Other | Admitting: Family Medicine

## 2012-04-02 VITALS — BP 94/70 | HR 95 | Temp 98.6°F | Ht 62.0 in | Wt 170.0 lb

## 2012-04-02 DIAGNOSIS — E1129 Type 2 diabetes mellitus with other diabetic kidney complication: Secondary | ICD-10-CM

## 2012-04-02 NOTE — Patient Instructions (Addendum)
It was great to see you again today!  You can stop checking your blood sugars every day. We can just switch to a blood test in the office every 3 months. I will see you back for an appointment in January.  We'll recheck your kidney test today, and your cholesterol.  I will call you if your test results are not normal.  Otherwise, I will send you a letter.  If you do not hear from me with in 2 weeks please call our office.

## 2012-04-02 NOTE — Assessment & Plan Note (Signed)
A1c great last month at 6.2. Will recheck BMET today and if looks stable, continue lisinopril for renal protection. Have advised pt that she can d/c checking her blood sugars as she is not on any diabetes medications and has such good control off medicines. Recheck a1c in January. Will also check direct LDL today as pt is due.  Cardiac - on aspirin Lipids - check direct LDL today Renal - BMET as above, lisinopril for renal protection Eye exam - within last year Foot exam - done last time

## 2012-04-02 NOTE — Progress Notes (Signed)
Patient ID: [name], female   DOB: 09-19-1975, 36 y.o.   MRN: [id number]  HPI: [name] is a 36 y.o. female here to follow up on her diabetes and renal test. Last visit we checked a BMET and her creatinine was 1.75, so she was started on lisinopril 5mg  to add renal protection. I had asked her caregiver to schedule a lab appointment and BP check for one week after starting the lisinopril so that we could recheck her BMET, but I think she misunderstood and thus patient is here for a followup office appointment today.  Pt has been doing well and feeling well. Denies any lightheadedness or concerns. She has been checking her blood sugars every day and consistently gets between 100-200. She is not on any diabetes medications, but was previously on metformin. Her a1c was less than 7 at her visit last month.  ROS: See HPI  PHYSICAL EXAM: BP 94/70  Pulse 95  Temp 98.6 F (37 C) (Oral)  Ht 5\' 2"  (1.575 m)  Wt 170 lb (77.111 kg)  BMI 31.09 kg/m2 Gen: NAD, pleasant, cooperative Heart: RRR, no m/r/g Lungs: CTAB, normal work of breathing Neuro: nonfocal, developmentally delayed but appears similar to previous visits

## 2012-04-02 NOTE — Assessment & Plan Note (Signed)
Recheck BMET today. If creatinine and electrolytes stable, will continue lisinopril for renal protection in DM. BP is slightly low today at 94/70, but patient denies any symptoms of hypotension. Caregiver instructed to call us if pt begins to have any problems with lightheadedness.

## 2012-04-03 LAB — BASIC METABOLIC PANEL
CO2: 24 mEq/L (ref 19–32)
Chloride: 107 mEq/L (ref 96–112)
Creat: 1.66 mg/dL — ABNORMAL HIGH (ref 0.50–1.10)
Sodium: 139 mEq/L (ref 135–145)

## 2012-04-07 ENCOUNTER — Other Ambulatory Visit: Payer: Self-pay | Admitting: Family Medicine

## 2012-04-07 ENCOUNTER — Encounter: Payer: Self-pay | Admitting: Family Medicine

## 2012-04-07 MED ORDER — LISINOPRIL 5 MG PO TABS: 5.0000 mg | ORAL_TABLET | Freq: Every day | ORAL | Status: DC

## 2012-04-15 ENCOUNTER — Telehealth: Payer: Self-pay | Admitting: Family Medicine

## 2012-04-15 NOTE — Telephone Encounter (Signed)
Caregiver dropped off standing order form to be filled out.  Please call her when completed.  Placed in dr's box.

## 2012-04-19 NOTE — Telephone Encounter (Signed)
Called and informed. .[name]  

## 2012-04-19 NOTE — Telephone Encounter (Signed)
Form completed and left at front. Please call caregiver to let her know form has been signed and is ready for pickup. Thanks!

## 2012-04-21 ENCOUNTER — Other Ambulatory Visit: Payer: Self-pay | Admitting: Family Medicine

## 2012-05-03 ENCOUNTER — Encounter: Payer: Self-pay | Admitting: Family Medicine

## 2012-05-11 ENCOUNTER — Other Ambulatory Visit: Payer: Self-pay | Admitting: Family Medicine

## 2012-05-18 ENCOUNTER — Other Ambulatory Visit: Payer: Self-pay | Admitting: Family Medicine

## 2012-05-31 ENCOUNTER — Other Ambulatory Visit: Payer: Self-pay | Admitting: Family Medicine

## 2012-06-02 ENCOUNTER — Ambulatory Visit (INDEPENDENT_AMBULATORY_CARE_PROVIDER_SITE_OTHER): Payer: Medicaid Other | Admitting: *Deleted

## 2012-06-02 VITALS — BP 120/74 | HR 86 | Wt 177.0 lb

## 2012-06-02 DIAGNOSIS — Z3049 Encounter for surveillance of other contraceptives: Secondary | ICD-10-CM

## 2012-06-02 MED ORDER — MEDROXYPROGESTERONE ACETATE 150 MG/ML IM SUSP
150.0000 mg | Freq: Once | INTRAMUSCULAR | Status: AC
  Administered 2012-06-02: 150 mg via INTRAMUSCULAR

## 2012-06-15 ENCOUNTER — Other Ambulatory Visit: Payer: Self-pay | Admitting: Family Medicine

## 2012-06-15 MED ORDER — FAMOTIDINE 20 MG PO TABS: 20.0000 mg | ORAL_TABLET | Freq: Two times a day (BID) | ORAL | Status: DC

## 2012-07-05 ENCOUNTER — Other Ambulatory Visit: Payer: Self-pay | Admitting: Family Medicine

## 2012-07-05 ENCOUNTER — Telehealth: Payer: Self-pay | Admitting: Family Medicine

## 2012-07-05 MED ORDER — ASPIRIN 81 MG PO CHEW: 81.0000 mg | CHEWABLE_TABLET | Freq: Every day | ORAL | Status: DC

## 2012-07-05 NOTE — Telephone Encounter (Signed)
To [facility] : [facility] red team - please call pt and tell her I have refilled her medicines but she should schedule a f/u visit to check up on her diabetes and other medical problems. Thanks!

## 2012-07-06 MED ORDER — ASPIRIN 81 MG PO CHEW: 81.0000 mg | CHEWABLE_TABLET | Freq: Every day | ORAL | Status: DC

## 2012-07-06 NOTE — Telephone Encounter (Signed)
Called pt. Left message to call us.  See message. Lorenda Hatchet, Renato Battles

## 2012-07-06 NOTE — Addendum Note (Signed)
Addended by: Damita Lack on: 07/06/2012 08:43 AM   Modules accepted: Orders

## 2012-07-08 NOTE — Telephone Encounter (Signed)
Upcoming appt 07/22/12 .[name]

## 2012-07-16 ENCOUNTER — Other Ambulatory Visit: Payer: Self-pay | Admitting: Family Medicine

## 2012-07-22 ENCOUNTER — Ambulatory Visit (INDEPENDENT_AMBULATORY_CARE_PROVIDER_SITE_OTHER): Payer: Medicaid Other | Admitting: Family Medicine

## 2012-07-22 VITALS — BP 111/75 | HR 106 | Ht 62.0 in | Wt 177.0 lb

## 2012-07-22 DIAGNOSIS — E1129 Type 2 diabetes mellitus with other diabetic kidney complication: Secondary | ICD-10-CM

## 2012-07-22 DIAGNOSIS — Z124 Encounter for screening for malignant neoplasm of cervix: Secondary | ICD-10-CM

## 2012-07-22 DIAGNOSIS — Z0000 Encounter for general adult medical examination without abnormal findings: Secondary | ICD-10-CM

## 2012-07-22 NOTE — Progress Notes (Signed)
Name: [name] Age/Sex: 37 y.o. female  HPI:  Pt interviewed w/ her caregiver, [name], present. Pt has hx of MR and schizophrenia and lives in a group home with [name].  Health maintenance: intended to perform pap smear today, as pt has not had one since 06/05/2008. Last LDL was checked in Nov 2013 (LDL 60). Pt uses depo for period control. Denies ever having been sexually active before. Denies vaginal discharge but does say she has odor.  Diabetes: Feels well. Is not currently on any diabetes medications. Does not check blood sugars (we stopped this at a previous visit). Denies chest pain, shortness of breath, or swelling in her legs. Caregiver encourages her to walk daily but doesn't think she walks like she is supposed to. Pt says she does walk. Last eye visit was > 1 year ago.  ROS: See HPI  PHYSICAL EXAM: BP 111/75  Pulse 106  Ht 5\' 2"  (1.575 m)  Wt 177 lb (80.287 kg)  BMI 32.37 kg/m2 Gen: NAD Heart: slightly tachycardic, regular rhythm Lungs: CTAB GU: external female genitalia normal in appearance. Attempted speculum exam, but could not complete exam for pap due to pt discomfort. No odor or discharge noted. Visualized internal vaginal tissue appeared normal - was not able to see cervix. Neuro: speech intact

## 2012-07-22 NOTE — Patient Instructions (Addendum)
It was great to see you again today!  She should see her eye doctor since its been > 1 year. We are checking her diabetes labwork today. I will call you if your test results are not normal.  Otherwise, I will send you a letter.  If you do not hear from me with in 2 weeks please call our office.     Work on [employer] and walking every day.  Call us with any questions or concerns.  Be well, Dr. Pollie Meyer

## 2012-07-24 NOTE — Assessment & Plan Note (Addendum)
Will check A1c today. Will need repeat BMET in 3 months (has renal disease). Recommended diabetic eye exam since it has been > 1 year. F/u in 3 months.  Update: a1c great at 6.4. Continue diet & exercise.

## 2012-07-24 NOTE — Assessment & Plan Note (Addendum)
Attempted pap smear today but was unable to complete speculum exam (including pap) due to severe patient discomfort, even with smallest available speculum. No anatomic abnormalities noted. Could consider repeat trial of pap smear in one year, but for now will hold off on further attempts.  Encouraged healthy eating and walking for exercise.

## 2012-07-26 ENCOUNTER — Encounter: Payer: Self-pay | Admitting: Family Medicine

## 2012-07-28 ENCOUNTER — Other Ambulatory Visit: Payer: Self-pay | Admitting: Family Medicine

## 2012-08-09 ENCOUNTER — Telehealth: Payer: Self-pay | Admitting: Family Medicine

## 2012-08-09 DIAGNOSIS — E1129 Type 2 diabetes mellitus with other diabetic kidney complication: Secondary | ICD-10-CM

## 2012-08-09 NOTE — Telephone Encounter (Signed)
Signed referral for ophthalmology in epic and wrote rx for "health, diabetic, low carb diet" on rx paper and gave to [employer].

## 2012-08-09 NOTE — Telephone Encounter (Signed)
Caregiver dropped off paperwork to be filled out concerning request for eye exam and documention of diet.

## 2012-08-09 NOTE — Telephone Encounter (Signed)
I have placed referral for Opthalmology.  Will place note in Dr. Valorie Roosevelt box to provide documentation about diet.  Ileana Ladd

## 2012-08-10 NOTE — Telephone Encounter (Signed)
Marlene notified documentation about diet has been written by Dr. Pollie Meyer and ready to be picked up at front desk.  Ileana Ladd

## 2012-08-13 ENCOUNTER — Telehealth: Payer: Self-pay | Admitting: Family Medicine

## 2012-08-13 NOTE — Telephone Encounter (Signed)
Will fwd. To PCP. .Taber Sweetser  

## 2012-08-13 NOTE — Telephone Encounter (Signed)
Patient needs a script saying that she can self medicate with supervision.  Caregiver says that this has been done by her other doctors in the past.  Please call when ready to be picked up.

## 2012-08-16 NOTE — Telephone Encounter (Signed)
Called caregiver Roddie Mc [facility]) to clarify. [name] gives herself her own medications under the direct supervision of her caregiver and [name] just needs a written rx documenting that [name] is allowed to do this. I have written out a prescription and placed it at the front desk for [name] to pick up; she is aware that it is there.

## 2012-08-20 ENCOUNTER — Ambulatory Visit (INDEPENDENT_AMBULATORY_CARE_PROVIDER_SITE_OTHER): Payer: Medicaid Other

## 2012-08-20 VITALS — BP 117/79 | HR 85 | Temp 97.2°F | Wt 179.9 lb

## 2012-08-20 DIAGNOSIS — Z3049 Encounter for surveillance of other contraceptives: Secondary | ICD-10-CM

## 2012-08-26 ENCOUNTER — Other Ambulatory Visit: Payer: Self-pay | Admitting: Family Medicine

## 2012-09-08 ENCOUNTER — Other Ambulatory Visit: Payer: Self-pay | Admitting: Family Medicine

## 2012-10-20 ENCOUNTER — Other Ambulatory Visit: Payer: Self-pay | Admitting: Family Medicine

## 2012-10-21 ENCOUNTER — Ambulatory Visit (INDEPENDENT_AMBULATORY_CARE_PROVIDER_SITE_OTHER): Payer: Medicaid Other | Admitting: Family Medicine

## 2012-10-21 ENCOUNTER — Ambulatory Visit (HOSPITAL_COMMUNITY)
Admission: RE | Admit: 2012-10-21 | Discharge: 2012-10-21 | Disposition: A | Discharge status: Home / Self Care | Payer: Medicaid Other | Source: Ambulatory Visit | Attending: Family Medicine | Admitting: Family Medicine

## 2012-10-21 ENCOUNTER — Encounter: Payer: Self-pay | Admitting: Family Medicine

## 2012-10-21 VITALS — BP 104/68 | HR 105 | Ht 63.5 in | Wt 179.0 lb

## 2012-10-21 DIAGNOSIS — R0789 Other chest pain: Secondary | ICD-10-CM

## 2012-10-21 DIAGNOSIS — R079 Chest pain, unspecified: Secondary | ICD-10-CM

## 2012-10-21 DIAGNOSIS — E1129 Type 2 diabetes mellitus with other diabetic kidney complication: Secondary | ICD-10-CM

## 2012-10-21 DIAGNOSIS — R9431 Abnormal electrocardiogram [ECG] [EKG]: Secondary | ICD-10-CM | POA: Insufficient documentation

## 2012-10-21 NOTE — Patient Instructions (Addendum)
It was great to see you again today!  If [name] continues to have chest discomfort, it worsens with exertion, or has any shortness of breath, I want to see her back here in the clinic immediately, or she should go to the Emergency Room.  [name] should work on eating a low-carbohydrate diet, because her diabetes test is slightly elevated today (6.8) but not high enough to need a medication.  If she has not had an eye exam in the past year, she should get one soon.  Please return in 2 weeks to follow up on the chest discomfort.  Be well, Dr. Pollie Meyer
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
## 2012-10-22 LAB — BASIC METABOLIC PANEL
CO2: 25 mEq/L (ref 19–32)
Chloride: 108 mEq/L (ref 96–112)
Creat: 1.63 mg/dL — ABNORMAL HIGH (ref 0.50–1.10)
Potassium: 4.5 mEq/L (ref 3.5–5.3)
Sodium: 140 mEq/L (ref 135–145)

## 2012-10-25 DIAGNOSIS — R0789 Other chest pain: Secondary | ICD-10-CM | POA: Insufficient documentation

## 2012-10-25 NOTE — Assessment & Plan Note (Addendum)
Pt c/o CP and SOB which is not worsened by exertion. It is non-anginal in description and very vague (describes it as a "hardness") type feeling. EKG in clinic today is nonischemic. Discussed with Dr. Sheffield Slider who agrees that CAD is unlikely in this 37 year old female patient whose only risk factor is diet controlled type 2 diabetes. Ddx includes musculoskeletal discomfort, anxiety or GERD. I think anxiety is most likely, given that pt did not complain of discomfort until after we discussed unsettling topics such as her sexual history. Will have patient f/u in 2 weeks to see if the discomfort is any better. Precepted with Dr. Sheffield Slider who agrees with this assessment and plan.

## 2012-10-25 NOTE — Assessment & Plan Note (Addendum)
A1c 6.8, up from 6.4. Advised that patient work on eating diabetic diet with low carbohydrates. Not currently on any diabetes medications. Will repeat A1c in six months. Recently had eye exam, caregiver will have those records sent to our office. Check BMET today given hx of elevated creatinine.

## 2012-10-27 ENCOUNTER — Telehealth: Payer: Self-pay | Admitting: Family Medicine

## 2012-10-27 NOTE — Telephone Encounter (Signed)
Caregiver dropped off FL2 to be filled out.   Please call her when completed. °

## 2012-10-28 NOTE — Telephone Encounter (Signed)
Form completed. Will place in [employer].

## 2012-10-28 NOTE — Telephone Encounter (Signed)
Message left at [phone] that FL-2 form is ready to be picked up at front desk.  Ileana Ladd

## 2012-11-03 ENCOUNTER — Other Ambulatory Visit: Payer: Self-pay | Admitting: Family Medicine

## 2012-11-05 ENCOUNTER — Ambulatory Visit (INDEPENDENT_AMBULATORY_CARE_PROVIDER_SITE_OTHER): Payer: Medicaid Other | Admitting: *Deleted

## 2012-11-05 ENCOUNTER — Telehealth: Payer: Self-pay | Admitting: *Deleted

## 2012-11-05 VITALS — BP 113/80 | HR 96 | Temp 97.4°F | Wt 177.3 lb

## 2012-11-05 DIAGNOSIS — Z3049 Encounter for surveillance of other contraceptives: Secondary | ICD-10-CM

## 2012-11-05 MED ORDER — MEDROXYPROGESTERONE ACETATE 150 MG/ML IM SUSP
150.0000 mg | Freq: Once | INTRAMUSCULAR | Status: AC
  Administered 2012-11-05: 150 mg via INTRAMUSCULAR

## 2012-11-05 NOTE — Telephone Encounter (Signed)
Opened in error

## 2012-11-09 ENCOUNTER — Other Ambulatory Visit: Payer: Self-pay | Admitting: Family Medicine

## 2012-11-09 MED ORDER — MEDROXYPROGESTERONE ACETATE 150 MG/ML IM SUSP: 150.0000 mg | INTRAMUSCULAR | Status: DC

## 2012-11-10 ENCOUNTER — Encounter: Payer: Self-pay | Admitting: Family Medicine

## 2012-11-10 ENCOUNTER — Ambulatory Visit (INDEPENDENT_AMBULATORY_CARE_PROVIDER_SITE_OTHER): Payer: Medicaid Other | Admitting: Family Medicine

## 2012-11-10 VITALS — BP 102/72 | HR 99 | Temp 98.1°F | Ht 63.5 in | Wt 177.0 lb

## 2012-11-10 DIAGNOSIS — R0789 Other chest pain: Secondary | ICD-10-CM

## 2012-11-10 NOTE — Patient Instructions (Signed)
It was great to see you again today!  I recommend [name] see her cardiologist to discuss the chest discomfort. It seems anxiety related, but it is probably a good idea for Dr. Ladona Ridgel to see her. Call to make this appointment.  If you have any chest pain that does not go away within 30 minutes, is accompanied by nausea, sweating, or made worse by activity, go to the emergency room immediately for evaluation. Also seek care immediately if you have any shortness of breath.  I will see you back in 2 months. Schedule this appointment on the way out, or call to make it.  Be well, Dr. Pollie Meyer

## 2012-11-10 NOTE — Assessment & Plan Note (Signed)
Given history discussed today, this discomfort seems largely anxiety related. Pt reports it happens only when she is nervous, which happens when she is around a lot of people. I think cardiac/anginal pain is much less likely. Pt does have hx of SVT and is due for f/u with her cardiologist (Dr. Ladona Ridgel) in the next month, so I have advised her caretaker to go ahead and schedule a f/u visit with Dr. Ladona Ridgel to discuss these symptoms. Otherwise, will plan to see pt back in 2 months for diabetes f/u.

## 2012-11-10 NOTE — Progress Notes (Signed)
Patient ID: [name], female   DOB: 10/10/1975, 37 y.o.   MRN: [id number]  HPI:  Followup on chest discomfort: Noted previously in a visit. Per pt, chest discomfort happens when gets nervous. Says the discomfort is like her heart is beating fast or fluttering. Not specifically associated with sweating or nausea. Gets nervous when she is around a lot of people. No palpitations recently, but pt says it used to be fast. Denies dizziness, lightheadedness. Says the heart pill (metoprolol) helps her heart a lot. It never happens if she is sitting still or when she is not nervous. Says she does not get short of breath often. Caretaker notes that pt has not complained of this discomfort to her at all.  Caretaker also requests I fill out a physical form for [name]. She will come pick it up when it is ready.  ROS: See HPI. Rare SOB, mild CP with social anxiety, no dizziness, no lightheadedness  PMFSH: Does not smoke. Unknown if FH of heart problems.  PHYSICAL EXAM: BP 102/72  Pulse 99  Temp(Src) 98.1 F (36.7 C) (Oral)  Ht 5' 3.5" (1.613 m)  Wt 177 lb (80.287 kg)  BMI 30.86 kg/m2 Gen: NAD HEENT: TM's clear bilaterally, oropharynx clear, PERRL Heart: RRR Lungs: CTAB, NWOB Abd: soft, nontender to palpation Neuro: grossly nonfocal, at pt's baseline Ext: no edema in bilateral lower extremities

## 2012-11-15 ENCOUNTER — Encounter: Payer: Self-pay | Admitting: *Deleted

## 2012-11-18 ENCOUNTER — Other Ambulatory Visit: Payer: Self-pay

## 2012-11-18 ENCOUNTER — Other Ambulatory Visit: Payer: Self-pay | Admitting: Family Medicine

## 2012-11-19 ENCOUNTER — Telehealth: Payer: Self-pay | Admitting: *Deleted

## 2012-11-19 NOTE — Telephone Encounter (Signed)
Clindamycin gel 1 % is not covered by Medicaid but clindamycin solution & bezaclin gel are preferred - per fax would you like to change?Wyatt Haste, RN-BSN

## 2012-11-23 NOTE — Telephone Encounter (Signed)
BGD called and verbal change/order called in for clindamycin solution that is covered by medicaid. Wyatt Haste, RN-BSN

## 2012-12-10 ENCOUNTER — Encounter: Payer: Self-pay | Admitting: Nurse Practitioner

## 2012-12-10 ENCOUNTER — Ambulatory Visit (INDEPENDENT_AMBULATORY_CARE_PROVIDER_SITE_OTHER): Payer: Medicaid Other | Admitting: Nurse Practitioner

## 2012-12-10 VITALS — BP 106/70 | HR 88 | Ht 63.0 in | Wt 177.0 lb

## 2012-12-10 DIAGNOSIS — I498 Other specified cardiac arrhythmias: Secondary | ICD-10-CM

## 2012-12-10 DIAGNOSIS — I471 Supraventricular tachycardia: Secondary | ICD-10-CM

## 2012-12-10 NOTE — Patient Instructions (Addendum)
We will get an echo arranged  Stay on your current medicines  See Dr. Ladona Ridgel in a year  Try to be more active  Call the [facility] office at [phone] if you have any questions, problems or concerns.

## 2012-12-10 NOTE — Progress Notes (Signed)
[name] Date of Birth: 1976-03-02 Medical Record #[id number]  History of Present Illness: [name] is seen back today for a one year check. Seen for Dr. Ladona Ridgel. She has a history of SVT with a short PR interval. Has been maintained on chronic beta blocker therapy. 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 She has a caregiver - Vladimir Crofts.   Last seen a year ago and was doing ok.   Comes back today. Here with the caregiver. Doing ok. Saw her PCP last month - reported chest discomfort and palpitations only when she gets anxious and around people. She tells me that this has been a chronic issue - has not been reporting any problems to her caregiver. She is very inactive. She notes that her heart will beat fast on occasion - but this is not new.   Current Outpatient Prescriptions  Medication Sig Dispense Refill  . ACCU-CHEK AVIVA PLUS test strip AS DIRECTED  90 strip  4  . acetaminophen (TYLENOL) 500 MG tablet Take 500 mg by mouth. As needed for pain       . aspirin 81 MG chewable tablet Chew 1 tablet (81 mg total) by mouth daily.  30 tablet  11  . benztropine (COGENTIN) 1 MG tablet Take 1 mg by mouth 2 (two) times daily.        . Blood Glucose Monitoring Suppl (PRODIGY AUTOCODE BLOOD GLUCOSE) DEVI by Does not apply route. Use as directed       . clindamycin (CLINDAGEL) 1 % gel APPLY DAILY  30 g  1  . clozapine (FAZACLO) 100 MG disintegrating tablet Take 100 mg by mouth. Dissolve four tablets in mouth at bedtime       . DIFFERIN 0.1 % gel APPLY AT BEDTIME  45 g  1  . divalproex (DEPAKOTE) 500 MG 24 hr tablet Take 500 mg by mouth 2 (two) times daily at 10 AM and 5 PM.       . DOK 100 MG capsule TAKE ONE CAPSULE EACH DAY  30 capsule  5  . famotidine (PEPCID) 20 MG tablet TAKE ONE TABLET TWICE DAILY  60 tablet  3  . Lancets (ACCU-CHEK MULTICLIX) lancets AS DIRECTED  102 each  3  . lisinopril (PRINIVIL,ZESTRIL) 5 MG tablet TAKE ONE  TABLET EACH DAY  30 tablet  3  . [START ON 01/21/2013] medroxyPROGESTERone (DEPO-PROVERA) 150 MG/ML injection Inject 1 mL (150 mg total) into the muscle every 3 (three) months.      Marland Kitchen PARoxetine (PAXIL) 30 MG tablet Take 30 mg by mouth. 2 by mouth at bedtime       . polyethylene glycol (MIRALAX / GLYCOLAX) packet Take 17 g by mouth daily. At bedtime.  Can increase to 2 x per day if any constipation  100 each  6  . polyethylene glycol powder (GLYCOLAX/MIRALAX) powder 17G BY MOUTH DAILY AT BEDTIME -CAN      INCRESASE TO TWICE DAILY IF ANY         CONSTIPATION  527 g  5  . simethicone (MYLICON) 125 MG chewable tablet 1 tab by mouth daily as needed for gas  30 tablet  6  . TOPROL XL 50 MG 24 hr tablet TAKE ONE TABLET EACH DAY  30 tablet  2   No current facility-administered medications for this visit.    Allergies  Allergen Reactions  . Minocycline     Past Medical History  Diagnosis Date  .  Psychic disease   . Schizoaffective disorder   . Patient overweight   . Anemia   . Internal hemorrhoid, bleeding 10/22/2010    Past Surgical History  Procedure Laterality Date  . Breast reduction surgery  08/10/2005  . Hysteroscopy  09/09/2004    w/ removal of polyps    History  Smoking status  . Never Smoker   Smokeless tobacco  . Never Used    History  Alcohol Use No    Family History  Problem Relation Age of Onset  . Schizophrenia Sister     Review of Systems: The review of systems is per the HPI.  All other systems were reviewed and are negative.  Physical Exam: There were no vitals taken for this visit. Patient is very pleasant and in no acute distress. Skin is warm and dry. Color is normal.  HEENT is unremarkable. Normocephalic/atraumatic. PERRL. Sclera are nonicteric. Neck is supple. No masses. No JVD. Lungs are clear. Cardiac exam shows a regular rate and rhythm. Abdomen is soft. Extremities are without edema. Gait and ROM are intact. No gross neurologic deficits  noted.  LABORATORY DATA: EKG today shows sinus rhythm, poor R wave progression and T wave inversion in V1 to 6. Short PR noted.    Lab Results  Component Value Date   WBC 5.4 01/08/2010   HGB 12.6 01/08/2010   HCT 40.4 01/08/2010   PLT 185 01/08/2010   GLUCOSE 117* 10/21/2012   CHOL 119 03/10/2011   TRIG 97 03/10/2011   HDL 30* 03/10/2011   LDLDIRECT 60 04/02/2012   LDLCALC 70 03/10/2011   ALT 12 03/10/2011   AST 15 03/10/2011   NA 140 10/21/2012   K 4.5 10/21/2012   CL 108 10/21/2012   CREATININE 1.63* 10/21/2012   BUN 20 10/21/2012   CO2 25 10/21/2012   TSH 0.668 04/18/2008   HGBA1C 6.8 10/21/2012     Assessment / Plan:  1. SVT - controlled with medical therapy. Sounds like her symptoms are at baseline.   2. Chest discomfort/palpitations - in the setting of being nervous or around people - EKG is abnormal. We will check an echo to rule out wall motion abnormalities. She has no exertional symptoms and in fact, needs to be more active.   3. HTN - BP is controlled  4. Mental retardation/schizophrenia   Will tentatively see her back in a year unless the echo is abnormal. No change in current medicines. Encouraged her to try and be more active.   Patient is agreeable to this plan and will call if any problems develop in the interim.   Rosalio Macadamia, RN, ANP-C [facility] [address] [place], [place]  [address]

## 2012-12-22 ENCOUNTER — Ambulatory Visit (HOSPITAL_COMMUNITY): Payer: Medicaid Other | Attending: Cardiovascular Disease | Admitting: Radiology

## 2012-12-22 DIAGNOSIS — R9431 Abnormal electrocardiogram [ECG] [EKG]: Secondary | ICD-10-CM | POA: Insufficient documentation

## 2012-12-22 DIAGNOSIS — I471 Supraventricular tachycardia, unspecified: Secondary | ICD-10-CM | POA: Insufficient documentation

## 2012-12-22 NOTE — Progress Notes (Signed)
Echocardiogram performed.  

## 2013-01-26 ENCOUNTER — Other Ambulatory Visit: Payer: Self-pay | Admitting: Family Medicine

## 2013-01-27 ENCOUNTER — Other Ambulatory Visit: Payer: Self-pay | Admitting: Family Medicine

## 2013-01-27 MED ORDER — SIMETHICONE 125 MG PO CHEW: CHEWABLE_TABLET | ORAL | Status: DC

## 2013-01-28 ENCOUNTER — Telehealth: Payer: Self-pay | Admitting: Family Medicine

## 2013-01-28 NOTE — Telephone Encounter (Signed)
To [facility] red team - please call pt and let her know I have refilled one month's worth of her Toprol but she needs to schedule an appointment to follow up on her diabetes. Thanks!

## 2013-01-31 NOTE — Telephone Encounter (Signed)
Pt's caregiver notified.  Arnell Slivinski, Darlyne Russian, CMA

## 2013-02-07 ENCOUNTER — Other Ambulatory Visit: Payer: Self-pay | Admitting: Family Medicine

## 2013-02-11 ENCOUNTER — Ambulatory Visit (INDEPENDENT_AMBULATORY_CARE_PROVIDER_SITE_OTHER): Payer: Medicaid Other | Admitting: *Deleted

## 2013-02-11 VITALS — BP 111/83 | HR 86

## 2013-02-11 DIAGNOSIS — Z3049 Encounter for surveillance of other contraceptives: Secondary | ICD-10-CM

## 2013-02-11 MED ORDER — MEDROXYPROGESTERONE ACETATE 104 MG/0.65ML ~~LOC~~ SUSP
104.0000 mg | Freq: Once | SUBCUTANEOUS | Status: AC
  Administered 2013-02-11: 104 mg via SUBCUTANEOUS

## 2013-02-14 ENCOUNTER — Encounter: Payer: Self-pay | Admitting: Family Medicine

## 2013-02-14 ENCOUNTER — Ambulatory Visit (INDEPENDENT_AMBULATORY_CARE_PROVIDER_SITE_OTHER): Payer: Medicaid Other | Admitting: Family Medicine

## 2013-02-14 VITALS — BP 104/72 | HR 97 | Temp 97.8°F | Ht 63.0 in | Wt 181.0 lb

## 2013-02-14 DIAGNOSIS — E1129 Type 2 diabetes mellitus with other diabetic kidney complication: Secondary | ICD-10-CM

## 2013-02-14 DIAGNOSIS — Z23 Encounter for immunization: Secondary | ICD-10-CM

## 2013-02-14 DIAGNOSIS — E663 Overweight: Secondary | ICD-10-CM

## 2013-02-14 MED ORDER — CETAPHIL MOISTURIZING EX LOTN: TOPICAL_LOTION | CUTANEOUS | Status: DC | PRN

## 2013-02-14 NOTE — Assessment & Plan Note (Addendum)
Weight up since last visit. Continued to encourage healthy eating and, and increased exercise. Will recheck weight at visit in 3 months.

## 2013-02-14 NOTE — Patient Instructions (Signed)
It was great to see you again today!  [name] looks great today. For the back stiffness you can try tylenol, if that keeps being a problem bring her back. Try to increase activity and do some stretches.  [name] got her flu shot today.  I gave a prescription for cetaphil lotion for her dry skin.  Work on [employer] and exercising.  Follow up in 3 months, or sooner if any problems occur.  Be well, Dr. Pollie Meyer

## 2013-02-14 NOTE — Progress Notes (Signed)
Patient ID: [name], female   DOB: 04/24/1976, 37 y.o.   MRN: [id number]  HPI:  Diabetes: Patient presents for three-month followup of diabetes. States she is feeling well. Denies chest pain, shortness of breath, vision changes. Her last eye exam was within the last year. She is not currently on any diabetes medications, other than lisinopril for renal protection. A1c today is 6.7.  Weight management: Patient's weight is up 4 pounds from last visit. We have previously discussed the benefits of exercise (including walking) and healthy eating. Her caregiver reports she is resistant to exercise, and lays around in the bed much of the time.   Back stiffness: Patient endorses some stiffness in her back and neck. Caregiver states she has never heard patient complain of this.  ROS: See HPI  PMFSH: History of schizophrenia and intellectual disability, followed by mental health.  PHYSICAL EXAM: BP 104/72  Pulse 97  Temp(Src) 97.8 F (36.6 C) (Oral)  Ht 5\' 3"  (1.6 m)  Wt 181 lb (82.101 kg)  BMI 32.07 kg/m2 Gen: No acute distress, well appearing, pleasant and cooperative HEENT: Full range of motion of neck Heart: Regular rate and rhythm, no murmurs Lungs: Good auscultation bilaterally, normal respiratory effort Neuro: Grossly nonfocal, at patient's baseline, speech normal, full strength in bilateral lower extremities, normal gait Back: Nontender to palpation over posterior musculature  ASSESSMENT/PLAN:  # Health maintenance:  -Flu shot given today  # Back stiffness: Likely secondary to lack of exercise, no abnormalities on exam.. Encouraged stretching exercises, Tylenol for pain as needed. As caregiver has never heard of patient complaining of this, will have them followup if this becomes a persistent issue.  See problem based charting for additional assessment/plan.   FOLLOW UP: F/u in 3 months for diabetes

## 2013-02-14 NOTE — Assessment & Plan Note (Addendum)
A1c remains well-controlled without any diabetes medicines. Will have patient followup in 3 months for routine checkup, and plan to repeat A1c in 6 months.  Cardiac: On aspirin. Needs direct LDL checked at next visit. Renal: Has chronic kidney disease, on lisinopril for renal protection Eye: Done within last year Foot: Done within last year

## 2013-02-28 ENCOUNTER — Other Ambulatory Visit: Payer: Self-pay | Admitting: Family Medicine

## 2013-03-08 ENCOUNTER — Other Ambulatory Visit: Payer: Self-pay | Admitting: Family Medicine

## 2013-03-25 ENCOUNTER — Ambulatory Visit: Payer: Medicaid Other | Admitting: Obstetrics & Gynecology

## 2013-04-13 ENCOUNTER — Telehealth: Payer: Self-pay | Admitting: Family Medicine

## 2013-04-13 NOTE — Telephone Encounter (Signed)
Caregiver brought in from for [facility] for standing physican orders to be completed.

## 2013-04-14 NOTE — Telephone Encounter (Signed)
Form placed in Dr. McIntyre's box.   [name] L, CMA  

## 2013-04-14 NOTE — Telephone Encounter (Signed)
Form signed and placed on Kristen Somerville's desk at nurse station. Latrelle Dodrill, MD

## 2013-04-14 NOTE — Telephone Encounter (Signed)
Pt notified.  Anitra Doxtater L, CMA  

## 2013-04-18 ENCOUNTER — Ambulatory Visit (INDEPENDENT_AMBULATORY_CARE_PROVIDER_SITE_OTHER): Payer: Medicaid Other | Admitting: Obstetrics & Gynecology

## 2013-04-18 ENCOUNTER — Encounter: Payer: Self-pay | Admitting: Obstetrics & Gynecology

## 2013-04-18 VITALS — BP 123/85 | HR 92 | Temp 97.7°F | Ht 65.0 in | Wt 184.1 lb

## 2013-04-18 DIAGNOSIS — N852 Hypertrophy of uterus: Secondary | ICD-10-CM

## 2013-04-18 DIAGNOSIS — Z01419 Encounter for gynecological examination (general) (routine) without abnormal findings: Secondary | ICD-10-CM

## 2013-04-18 DIAGNOSIS — Z0000 Encounter for general adult medical examination without abnormal findings: Secondary | ICD-10-CM

## 2013-04-18 NOTE — Progress Notes (Signed)
Subjective:    [name] is a 37 y.o. female who presents for an annual exam. The patient has no complaints today. The patient has never been sexually active. GYN screening history: last pap: was normal. The patient wears seatbelts: yes. The patient participates in regular exercise: no. Has the patient ever been transfused or tattooed?: no. The patient reports that there is not domestic violence in her life.   Menstrual History: OB History   Grav Para Term Preterm Abortions TAB SAB Ect Mult Living                  Menarche age: ?  No LMP recorded. Patient has had an injection.    The following portions of the patient's history were reviewed and updated as appropriate: allergies, current medications, past family history, past medical history, past social history, past surgical history and problem list.  Review of Systems A comprehensive review of systems was negative. She lives in an assisted living facility with a group home with a house mate. She gets depo provera for induction of amenorrhea.   Objective:    BP 123/85  Pulse 92  Temp(Src) 97.7 F (36.5 C) (Oral)  Ht 5\' 5"  (1.651 m)  Wt 184 lb 1.6 oz (83.507 kg)  BMI 30.64 kg/m2  General Appearance:    Alert, cooperative, no distress, appears stated age  Head:    Normocephalic, without obvious abnormality, atraumatic  Eyes:    PERRL, conjunctiva/corneas clear, EOM's intact, fundi    benign, both eyes  Ears:    Normal TM's and external ear canals, both ears  Nose:   Nares normal, septum midline, mucosa normal, no drainage    or sinus tenderness  Throat:   Lips, mucosa, and tongue normal; teeth and gums normal  Neck:   Supple, symmetrical, trachea midline, no adenopathy;    thyroid:  no enlargement/tenderness/nodules; no carotid   bruit or JVD  Back:     Symmetric, no curvature, ROM normal, no CVA tenderness  Lungs:     Clear to auscultation bilaterally, respirations unlabored  Chest Wall:    No tenderness or deformity   Heart:    Regular rate and rhythm, S1 and S2 normal, no murmur, rub   or gallop  Breast Exam:    No tenderness, masses, or nipple abnormality  Abdomen:     Soft, non-tender, bowel sounds active all four quadrants,    no masses, no organomegaly  Genitalia:    Normal female without lesion, discharge or tenderness, virginal introitus, I did a "bimanual" exam with a pinky in her vagina. I believe that her uterus is enlarged. She categorically refuses a pap smear     Extremities:   Extremities normal, atraumatic, no cyanosis or edema  Pulses:   2+ and symmetric all extremities  Skin:   Skin color, texture, turgor normal, no rashes or lesions  Lymph nodes:   Cervical, supraclavicular, and axillary nodes normal  Neurologic:   CNII-XII intact, normal strength, sensation and reflexes    throughout  .    Assessment:    Healthy female exam.  Probable uterine enlargement   Plan:     Breast self exam technique reviewed and patient encouraged to perform self-exam monthly. Pelvic ultrasound.

## 2013-04-21 ENCOUNTER — Ambulatory Visit (HOSPITAL_COMMUNITY)
Admission: RE | Admit: 2013-04-21 | Discharge: 2013-04-21 | Disposition: A | Discharge status: Home / Self Care | Payer: Medicaid Other | Source: Ambulatory Visit | Attending: Obstetrics & Gynecology | Admitting: Obstetrics & Gynecology

## 2013-04-21 DIAGNOSIS — N852 Hypertrophy of uterus: Secondary | ICD-10-CM | POA: Insufficient documentation

## 2013-04-21 DIAGNOSIS — D259 Leiomyoma of uterus, unspecified: Secondary | ICD-10-CM | POA: Insufficient documentation

## 2013-04-26 ENCOUNTER — Other Ambulatory Visit: Payer: Self-pay | Admitting: Family Medicine

## 2013-04-27 ENCOUNTER — Ambulatory Visit: Payer: Medicaid Other | Admitting: Family Medicine

## 2013-05-10 ENCOUNTER — Ambulatory Visit (INDEPENDENT_AMBULATORY_CARE_PROVIDER_SITE_OTHER): Payer: Medicaid Other

## 2013-05-10 VITALS — BP 103/71 | HR 98 | Temp 96.9°F | Wt 181.4 lb

## 2013-05-10 DIAGNOSIS — Z3049 Encounter for surveillance of other contraceptives: Secondary | ICD-10-CM

## 2013-05-10 MED ORDER — MEDROXYPROGESTERONE ACETATE 104 MG/0.65ML ~~LOC~~ SUSP
104.0000 mg | Freq: Once | SUBCUTANEOUS | Status: AC
  Administered 2013-05-10: 104 mg via SUBCUTANEOUS

## 2013-05-11 ENCOUNTER — Ambulatory Visit (INDEPENDENT_AMBULATORY_CARE_PROVIDER_SITE_OTHER): Payer: Medicaid Other | Admitting: Family Medicine

## 2013-05-11 ENCOUNTER — Encounter: Payer: Self-pay | Admitting: Family Medicine

## 2013-05-11 VITALS — BP 97/66 | HR 101 | Temp 98.2°F | Ht 65.0 in | Wt 180.0 lb

## 2013-05-11 DIAGNOSIS — L708 Other acne: Secondary | ICD-10-CM

## 2013-05-11 DIAGNOSIS — E663 Overweight: Secondary | ICD-10-CM

## 2013-05-11 DIAGNOSIS — L709 Acne, unspecified: Secondary | ICD-10-CM

## 2013-05-11 MED ORDER — FAMOTIDINE 20 MG PO TABS: ORAL_TABLET | ORAL | Status: DC

## 2013-05-11 MED ORDER — POLYETHYLENE GLYCOL 3350 17 GM/SCOOP PO POWD: ORAL | Status: DC

## 2013-05-11 MED ORDER — DOCUSATE SODIUM 100 MG PO CAPS: ORAL_CAPSULE | ORAL | Status: DC

## 2013-05-11 MED ORDER — METOPROLOL SUCCINATE ER 50 MG PO TB24: ORAL_TABLET | ORAL | Status: DC

## 2013-05-11 MED ORDER — CLINDAMYCIN PHOSPHATE 1 % EX GEL: Freq: Every day | CUTANEOUS | Status: DC

## 2013-05-11 MED ORDER — ASPIRIN 81 MG PO CHEW: 81.0000 mg | CHEWABLE_TABLET | Freq: Every day | ORAL | Status: DC

## 2013-05-11 MED ORDER — ADAPALENE 0.1 % EX GEL: CUTANEOUS | Status: DC

## 2013-05-11 MED ORDER — LISINOPRIL 5 MG PO TABS: ORAL_TABLET | ORAL | Status: DC

## 2013-05-11 NOTE — Patient Instructions (Signed)
It was great to see you again today!  Keep a chart of daily exercise by walking up and down the street. Bring it with you to your next appointment. I refilled your medicines.  Be well, Dr. Pollie Meyer

## 2013-05-11 NOTE — Progress Notes (Signed)
Patient ID: [name], female   DOB: 1976/01/24, 37 y.o.   MRN: [id number]  HPI:  Weight follow up: caregiver present with pt today and reports that pt minimally exercises and has gained some weight. She lays in bed or on the couch much of the day. Her teacher had to stop teaching so she is no longer going to school which has further limited her activity.  Acne: sxs are well controlled with current regimen (clindagel and differin). Satisfied with this regimen.  ROS: See HPI. Endorses occasional tingling in feet. Denies dizziness, lightheadedness, SOB, or chest pain.  PMFSH: hx MR, schizophrenia followed by psychiatry  PHYSICAL EXAM: BP 97/66  Pulse 101  Temp(Src) 98.2 F (36.8 C) (Oral)  Ht 5\' 5"  (1.651 m)  Wt 180 lb (81.647 kg)  BMI 29.95 kg/m2 Gen: NAD HEENT: NCAT, no papules or pustules present on face, no comedones Heart: RRR Lungs: CTAB Neuro: grossly nonfocal, speech intact (at pt's baseline) Ext: No appreciable lower extremity edema bilaterally   ASSESSMENT/PLAN:  See problem based charting for assessment/plan.   FOLLOW UP: F/u in 3 months for diabetes & weight.  [place] J. Pollie Meyer, MD [facility]

## 2013-05-14 NOTE — Assessment & Plan Note (Signed)
Well controlled. Continue differin and clindamycin.

## 2013-05-14 NOTE — Assessment & Plan Note (Signed)
Discussed with pt the importance of regular exercise to control weight. Advised that she use her calendar to check off the days she exercises. Recommended walking up and down the street if it is safe to do so where she lives, and she and caregiver say that it is safe. Will f/u on her walking in visit in 3 months.

## 2013-05-30 ENCOUNTER — Ambulatory Visit: Payer: Medicaid Other | Admitting: Obstetrics & Gynecology

## 2013-06-02 ENCOUNTER — Ambulatory Visit: Payer: Medicaid Other | Admitting: Obstetrics & Gynecology

## 2013-06-03 ENCOUNTER — Encounter: Payer: Self-pay | Admitting: Obstetrics & Gynecology

## 2013-06-03 ENCOUNTER — Other Ambulatory Visit (HOSPITAL_COMMUNITY)
Admission: RE | Admit: 2013-06-03 | Discharge: 2013-06-03 | Disposition: A | Discharge status: Home / Self Care | Payer: Medicaid Other | Source: Ambulatory Visit | Attending: Obstetrics & Gynecology | Admitting: Obstetrics & Gynecology

## 2013-06-03 ENCOUNTER — Ambulatory Visit (INDEPENDENT_AMBULATORY_CARE_PROVIDER_SITE_OTHER): Payer: Medicaid Other | Admitting: Obstetrics & Gynecology

## 2013-06-03 VITALS — BP 94/69 | HR 96 | Temp 97.4°F | Wt 181.0 lb

## 2013-06-03 DIAGNOSIS — Z124 Encounter for screening for malignant neoplasm of cervix: Secondary | ICD-10-CM

## 2013-06-03 DIAGNOSIS — Z1151 Encounter for screening for human papillomavirus (HPV): Secondary | ICD-10-CM | POA: Insufficient documentation

## 2013-06-03 DIAGNOSIS — Z01419 Encounter for gynecological examination (general) (routine) without abnormal findings: Secondary | ICD-10-CM | POA: Insufficient documentation

## 2013-06-03 NOTE — Patient Instructions (Signed)
Pap Test A Pap test is a procedure done in a clinic office to evaluate cells that are on the surface of the cervix. The cervix is the lower portion of the uterus and upper portion of the vagina. For some women, the cervical region has the potential to form cancer. With consistent evaluations by your caregiver, this type of cancer can be prevented.  If a Pap test is abnormal, it is most often a result of a previous exposure to human papillomavirus (HPV). HPV is a virus that can infect the cells of the cervix and cause dysplasia. Dysplasia is where the cells no longer look normal. If a woman has been diagnosed with high-grade or severe dysplasia, they are at higher risk of developing cervical cancer. People diagnosed with low-grade dysplasia should still be seen by their caregiver because there is a small chance that low-grade dysplasia could develop into cancer.  LET YOUR CAREGIVER KNOW ABOUT:  Recent sexually transmitted infection (STI) you have had.  Any new sex partners you have had.  History of previous abnormal Pap tests results.  History of previous cervical procedures you have had (colposcopy, biopsy, loop electrosurgical excision procedure [LEEP]).  Concerns you have had regarding unusual vaginal discharge.  History of pelvic pain.  Your use of birth control. BEFORE THE PROCEDURE  Ask your caregiver when to schedule your Pap test. It is best not to be on your period if your caregiver uses a wooden spatula to collect cells or applies cells to a glass slide. Newer techniques are not so sensitive to the timing of a menstrual cycle.  Do not douche or have sexual intercourse for 24 hours before the test.   Do not use vaginal creams or tampons for 24 hours before the test.   Empty your bladder just before the test to lessen any discomfort.  PROCEDURE You will lie on an exam table with your feet in stirrups. A warm metal or plastic instrument (speculum) is placed in your vagina. This  instrument allows your caregiver to see the inside of your vagina and look at your cervix. A small, plastic brush or wooden spatula is then used to collect cervical cells. These cells are placed in a lab specimen container. The cells are looked at under a microscope. A specialist will determine if the cells are normal.  AFTER THE PROCEDURE Make sure to get your test results.If your results come back abnormal, you may need further testing.  Document Released: 07/19/2002 Document Revised: 07/21/2011 Document Reviewed: 04/24/2011 [facility] Patient Information 2014 [place], [place].

## 2013-06-03 NOTE — Progress Notes (Signed)
Subjective:     Patient ID: [name], female   DOB: June 19, 1975, 38 y.o.   MRN: [id number]  HPI Pt was here in Dec 2014 but, was unable to tolerate a pelvic exam.  She is here for another attempt at completing a PAP.  Here caretaker says that she has had her for 4 years and she has only been able to tolerate a PAP or pelvic exam 1 time. Pt has no complaints.   Review of Systems     Objective:   Physical Exam BP 94/69  Pulse 96  Temp(Src) 97.4 F (36.3 C) (Oral)  Wt 181 lb (82.101 kg) GU: EGBUS: no lesions Vagina: no blood in vault Cervix: no lesion; no mucopurulent d/c; PAP obtained Bimanual not done  04/21/2013 EXAM:  TRANSABDOMINAL ULTRASOUND OF PELVIS  TECHNIQUE:  Transabdominal ultrasound examination of the pelvis was performed  including evaluation of the uterus, ovaries, adnexal regions, and  pelvic cul-de-sac.  COMPARISON: None  FINDINGS:  Uterus  Measurements: 7.1 x 4.4 x 4.9 cm. There is a fibroid arising from  the anterior uterine fundus measuring 2.3 x 2.6 x 2.6 cm.  Endometrium  Thickness: 3.8 mm. No focal abnormality visualized.  Right ovary  Measurements: 2.9 x 2.0 x 2.2 cm. Normal appearance/no adnexal  mass.  Left ovary  Measurements: 2.9 x 1.5 x 1.7 cm. Normal appearance/no adnexal  mass.  Other findings: No free fluid  IMPRESSION:  1. Anterior fundal uterine fibroid.      Assessment:     Screening PAP (infant speculum used)     Plan:     F/u PAP with HPV  F/u in 5 years

## 2013-06-09 ENCOUNTER — Encounter: Payer: Self-pay | Admitting: *Deleted

## 2013-07-11 ENCOUNTER — Telehealth: Payer: Self-pay | Admitting: Family Medicine

## 2013-07-11 NOTE — Telephone Encounter (Signed)
Caregiver dropped off annual standing orders to be filled out.  [place] call her when completed.

## 2013-07-12 NOTE — Telephone Encounter (Signed)
Form placed in MD box for signature.  Please return to [place] when completed.  Emrick Hensch, Loralyn [place], [place]

## 2013-07-13 NOTE — Telephone Encounter (Signed)
Form completed and returned to [facility].

## 2013-07-13 NOTE — Telephone Encounter (Signed)
Caregiver called and informed that form is complete and ready for pick up.  Derl Barrow, RN

## 2013-08-01 ENCOUNTER — Ambulatory Visit (INDEPENDENT_AMBULATORY_CARE_PROVIDER_SITE_OTHER): Payer: Medicaid Other | Admitting: *Deleted

## 2013-08-01 VITALS — BP 93/69 | HR 94 | Temp 97.8°F | Wt 183.7 lb

## 2013-08-01 DIAGNOSIS — N949 Unspecified condition associated with female genital organs and menstrual cycle: Secondary | ICD-10-CM

## 2013-08-01 DIAGNOSIS — N925 Other specified irregular menstruation: Secondary | ICD-10-CM

## 2013-08-01 DIAGNOSIS — N938 Other specified abnormal uterine and vaginal bleeding: Secondary | ICD-10-CM

## 2013-08-01 MED ORDER — MEDROXYPROGESTERONE ACETATE 104 MG/0.65ML ~~LOC~~ SUSP
104.0000 mg | Freq: Once | SUBCUTANEOUS | Status: AC
  Administered 2013-08-01: 104 mg via SUBCUTANEOUS

## 2013-08-01 NOTE — Progress Notes (Signed)
Depo Provera 104 mg injection given rt arm/ Group home information filled out/given to patient.

## 2013-08-02 ENCOUNTER — Telehealth: Payer: Self-pay | Admitting: Family Medicine

## 2013-08-02 NOTE — Telephone Encounter (Signed)
Caregiver dropped off physical form to be filled out.  Please call her when completed.

## 2013-08-03 NOTE — Telephone Encounter (Signed)
Form placed in MD box for completion.  [name], [name], [name]

## 2013-08-08 NOTE — Telephone Encounter (Signed)
Form completed & med list printed out. Will place on [place] RN's desk. Leeanne Rio, MD

## 2013-08-09 NOTE — Telephone Encounter (Signed)
Left voice message that physical form is completed and ready for pick up.  Derl Barrow, RN

## 2013-08-10 ENCOUNTER — Encounter: Payer: Self-pay | Admitting: Family Medicine

## 2013-08-10 ENCOUNTER — Ambulatory Visit (INDEPENDENT_AMBULATORY_CARE_PROVIDER_SITE_OTHER): Payer: Medicaid Other | Admitting: Family Medicine

## 2013-08-10 VITALS — BP 95/63 | HR 99 | Temp 97.6°F | Ht 65.0 in | Wt 182.0 lb

## 2013-08-10 DIAGNOSIS — I959 Hypotension, unspecified: Secondary | ICD-10-CM

## 2013-08-10 DIAGNOSIS — E1129 Type 2 diabetes mellitus with other diabetic kidney complication: Secondary | ICD-10-CM

## 2013-08-10 DIAGNOSIS — K5909 Other constipation: Secondary | ICD-10-CM

## 2013-08-10 LAB — LIPID PANEL
Cholesterol: 169 mg/dL (ref 0–200)
HDL: 27 mg/dL — AB (ref >39)
LDL Cholesterol: 83 mg/dL (ref 0–99)
TRIGLYCERIDES: 297 mg/dL — AB (ref <150)
Total CHOL/HDL Ratio: 6.3 Ratio
VLDL: 59 mg/dL — AB (ref 0–40)

## 2013-08-10 LAB — COMPREHENSIVE METABOLIC PANEL
ALK PHOS: 65 U/L (ref 39–117)
ALT: 8 U/L (ref 0–35)
AST: 11 U/L (ref 0–37)
Albumin: 3.8 g/dL (ref 3.5–5.2)
BILIRUBIN TOTAL: 0.2 mg/dL (ref 0.2–1.2)
BUN: 25 mg/dL — AB (ref 6–23)
CO2: 22 mEq/L (ref 19–32)
CREATININE: 1.67 mg/dL — AB (ref 0.50–1.10)
Calcium: 9.2 mg/dL (ref 8.4–10.5)
Chloride: 107 mEq/L (ref 96–112)
GLUCOSE: 353 mg/dL — AB (ref 70–99)
POTASSIUM: 4.4 meq/L (ref 3.5–5.3)
Sodium: 141 mEq/L (ref 135–145)
Total Protein: 6.5 g/dL (ref 6.0–8.3)

## 2013-08-10 LAB — POCT GLYCOSYLATED HEMOGLOBIN (HGB A1C): HEMOGLOBIN A1C: 8

## 2013-08-10 MED ORDER — GLUCOSE BLOOD VI STRP: ORAL_STRIP | Status: DC

## 2013-08-10 MED ORDER — GLIPIZIDE 2.5 MG HALF TABLET: 2.5000 mg | ORAL_TABLET | Freq: Every day | ORAL | Status: DC

## 2013-08-10 MED ORDER — LISINOPRIL 5 MG PO TABS: 2.5000 mg | ORAL_TABLET | Freq: Every day | ORAL | Status: DC

## 2013-08-10 MED ORDER — ACCU-CHEK MULTICLIX LANCETS MISC: Status: DC

## 2013-08-10 NOTE — Progress Notes (Signed)
Patient ID: [name], female   DOB: October 22, 1975, 38 y.o.   MRN: [id number]  HPI:  Diabetes: not currently taking any blood sugar regulating medications. Last eye exam was April 2014. Denies any new/worsening chest discomfort but does occasionally have some chest heaviness. Has not complained of this at all to her caregiver. Does not exercise at all. Does well with her diet according to caregiver. They are working to get her into a day program.  Hypotension: noted on vitals today. Patient denies feeling dizzy or lightheaded. She has been eating/drinking normally. No current chest pain or shortness of breath. Has not had any diarrhea.  Constipation: takes miralax daily. Hx difficult to obtain from patient, as she perseverates during explanation on how much she has to wipe. Pt would prefer not to take miralax daily.   ROS: See HPI  [place]: hx schizophrenia, mental retardation  PHYSICAL EXAM: BP 85/57  Pulse 110  Temp(Src) 97.6 F (36.4 C) (Oral)  Ht 5' 5"  (1.651 m)  Wt 182 lb (82.555 kg)  BMI 30.29 kg/m2 Gen: NAD HEENT: NCAT Heart: RRR, no murmurs Lungs: CTAB, NWO Abdomen: soft, nontender to palpation Neuro: grossly nonfocal, speech intact Ext: mild trace edema over bilateral shins  Orthostatics: Lying BP 92/60 pulse 92 Sitting BP 92/60 pulse 95 Standing BP 95/63 pulse 99  ASSESSMENT/PLAN:  # Hypotension: Asymptomatic. Initially BP with systolic of 85. Improved upon checking orthostatics. Orthostatics negative today in office. Suspect this relative hypotension is benign, but will cut back on lisinopril to 2.78m daily (pt is just on this for renal protection given her CKD and diabetes). Advised staying well hydrated. F/u in 2 weeks for BP recheck. Will also check CMET today.  See problem based charting for additional assessment/plan.  FOLLOW UP: F/u in 2 weeks for BP recheck.  [place]as MArdelia Mems [place]

## 2013-08-10 NOTE — Patient Instructions (Signed)
It was great to see you again today!  It is okay to take the miralax every other day as needed.  Cut the lisinopril in to 2.84m daily. (cut it in half)  I will call you if your test results are not normal.  Otherwise, I will send you a letter.  If you do not hear from me with in 2 weeks please call our office.      Follow up in 2-3 weeks for a blood pressure recheck.  I sent in a new medicien for diabetes (glipizide 2.578mdaily). Drink plenty of fluids. Check blood sugar daily and keep log.  Be well, Dr. [name]

## 2013-08-11 ENCOUNTER — Telehealth: Payer: Self-pay | Admitting: *Deleted

## 2013-08-11 MED ORDER — LISINOPRIL 2.5 MG PO TABS: 2.5000 mg | ORAL_TABLET | Freq: Every day | ORAL | Status: DC

## 2013-08-11 NOTE — Telephone Encounter (Signed)
Rx sent in. Thanks! Leeanne Rio, MD

## 2013-08-11 NOTE — Telephone Encounter (Signed)
Received fax from [facility] regarding pt's Lisinopril 5 mg. Directions to cut in 1/2 to 2.5 mg daily. Pharmacy need new new Rx for either 5 mg-1/2 daily or 2.5 mg-1 daily.  The 5 mg tabs don't cut well per pharmacy.  Derl Barrow, RN

## 2013-08-15 NOTE — Assessment & Plan Note (Signed)
A1c 8.0, up from 6.7 six months ago. Not eligible for metformin due to CKD. Will initiate low dose glipizide 2.69m daily. Pt is to resume daily CBG's.  Cardiac: on aspirin, check lipids today (nonfasting) Renal: CKD, on lisinopril for renal protection (decreasing dose today due to hypotension), check CMET today Eye: due for exam in April 2015, caregiver will schedule Foot: due, will perform at next visit

## 2013-08-15 NOTE — Assessment & Plan Note (Signed)
Advised pt and caregiver that it is okay to use miralax every other day, if pt prefers this and she is able to tolerate the decreased frequency.

## 2013-08-16 ENCOUNTER — Telehealth: Payer: Self-pay | Admitting: Family Medicine

## 2013-08-16 MED ORDER — ATORVASTATIN CALCIUM 20 MG PO TABS: 20.0000 mg | ORAL_TABLET | Freq: Every day | ORAL | Status: DC

## 2013-08-16 NOTE — Telephone Encounter (Signed)
[place] red team, please call pt's caregiver ([name]) and let her know that due to her cholesterol results, I recommend she take a cholesterol medicine. I've sent this to her pharmacy.  Thanks, Leeanne Rio, MD

## 2013-08-17 MED ORDER — ATORVASTATIN CALCIUM 20 MG PO TABS: 20.0000 mg | ORAL_TABLET | Freq: Every day | ORAL | Status: DC

## 2013-08-17 NOTE — Telephone Encounter (Signed)
Will fwd to MD for written script.  [place]

## 2013-08-17 NOTE — Telephone Encounter (Signed)
Ms [name] needs a script for the new medication for her records. She can pick it up Please call when ready for pick up

## 2013-08-17 NOTE — Telephone Encounter (Signed)
Rx done, will place at front desk. Please inform [name].  Leeanne Rio, MD

## 2013-08-17 NOTE — Telephone Encounter (Signed)
Pt's caregiver, Ms. [place] notified.  [place]

## 2013-08-17 NOTE — Telephone Encounter (Signed)
Called. Mailbox is full. Unable to leave message. [name]

## 2013-08-24 ENCOUNTER — Ambulatory Visit (INDEPENDENT_AMBULATORY_CARE_PROVIDER_SITE_OTHER): Payer: Medicaid Other | Admitting: *Deleted

## 2013-08-24 ENCOUNTER — Telehealth: Payer: Self-pay | Admitting: Family Medicine

## 2013-08-24 VITALS — BP 128/72

## 2013-08-24 DIAGNOSIS — Z136 Encounter for screening for cardiovascular disorders: Secondary | ICD-10-CM

## 2013-08-24 DIAGNOSIS — Z0130 Encounter for examination of blood pressure without abnormal findings: Secondary | ICD-10-CM

## 2013-08-24 NOTE — Progress Notes (Signed)
   Pt in clinic for blood pressure check.  Blood pressure 128/72 manually.  Pt denies any other symptoms today.  Will forward to PCP.  Derl Barrow, RN

## 2013-08-24 NOTE — Telephone Encounter (Signed)
Form put in PCP's box. Thanks. [name]

## 2013-08-24 NOTE — Telephone Encounter (Signed)
Caregiver dropped off paper to be signed by dr.  Please call her when completed

## 2013-08-25 NOTE — Telephone Encounter (Signed)
Form completed, will return to [facility]. Leeanne Rio, MD

## 2013-08-26 NOTE — Telephone Encounter (Signed)
Pt's caregiver aware that form is completed and ready for pick up.  Derl Barrow, RN

## 2013-08-28 NOTE — Progress Notes (Signed)
No changes to management.  Leeanne Rio, MD

## 2013-09-12 ENCOUNTER — Ambulatory Visit: Payer: Medicaid Other | Admitting: Family Medicine

## 2013-10-12 ENCOUNTER — Ambulatory Visit (INDEPENDENT_AMBULATORY_CARE_PROVIDER_SITE_OTHER): Payer: Medicaid Other | Admitting: Family Medicine

## 2013-10-12 ENCOUNTER — Emergency Department (HOSPITAL_COMMUNITY)
Admission: EM | Admit: 2013-10-12 | Discharge: 2013-10-12 | Disposition: A | Discharge status: Home / Self Care | Payer: Medicaid Other | Attending: Emergency Medicine | Admitting: Emergency Medicine

## 2013-10-12 ENCOUNTER — Emergency Department (HOSPITAL_COMMUNITY): Payer: Medicaid Other

## 2013-10-12 ENCOUNTER — Encounter (HOSPITAL_COMMUNITY): Payer: Self-pay | Admitting: Emergency Medicine

## 2013-10-12 ENCOUNTER — Encounter: Payer: Self-pay | Admitting: Family Medicine

## 2013-10-12 ENCOUNTER — Ambulatory Visit (HOSPITAL_COMMUNITY)
Admit: 2013-10-12 | Discharge: 2013-10-12 | Disposition: A | Discharge status: Home / Self Care | Payer: Medicaid Other | Attending: Family Medicine | Admitting: Family Medicine

## 2013-10-12 ENCOUNTER — Other Ambulatory Visit: Payer: Self-pay

## 2013-10-12 VITALS — BP 103/70 | HR 99 | Ht 65.0 in | Wt 185.0 lb

## 2013-10-12 DIAGNOSIS — Z79899 Other long term (current) drug therapy: Secondary | ICD-10-CM | POA: Insufficient documentation

## 2013-10-12 DIAGNOSIS — R072 Precordial pain: Secondary | ICD-10-CM | POA: Insufficient documentation

## 2013-10-12 DIAGNOSIS — Z862 Personal history of diseases of the blood and blood-forming organs and certain disorders involving the immune mechanism: Secondary | ICD-10-CM | POA: Insufficient documentation

## 2013-10-12 DIAGNOSIS — Z8679 Personal history of other diseases of the circulatory system: Secondary | ICD-10-CM | POA: Insufficient documentation

## 2013-10-12 DIAGNOSIS — K5900 Constipation, unspecified: Secondary | ICD-10-CM | POA: Insufficient documentation

## 2013-10-12 DIAGNOSIS — E1129 Type 2 diabetes mellitus with other diabetic kidney complication: Secondary | ICD-10-CM

## 2013-10-12 DIAGNOSIS — K5909 Other constipation: Secondary | ICD-10-CM

## 2013-10-12 DIAGNOSIS — Z7982 Long term (current) use of aspirin: Secondary | ICD-10-CM | POA: Insufficient documentation

## 2013-10-12 DIAGNOSIS — R0789 Other chest pain: Secondary | ICD-10-CM

## 2013-10-12 DIAGNOSIS — R079 Chest pain, unspecified: Secondary | ICD-10-CM

## 2013-10-12 DIAGNOSIS — Z8659 Personal history of other mental and behavioral disorders: Secondary | ICD-10-CM | POA: Insufficient documentation

## 2013-10-12 DIAGNOSIS — Z792 Long term (current) use of antibiotics: Secondary | ICD-10-CM | POA: Insufficient documentation

## 2013-10-12 DIAGNOSIS — R1013 Epigastric pain: Secondary | ICD-10-CM | POA: Insufficient documentation

## 2013-10-12 DIAGNOSIS — Z9889 Other specified postprocedural states: Secondary | ICD-10-CM | POA: Insufficient documentation

## 2013-10-12 DIAGNOSIS — E669 Obesity, unspecified: Secondary | ICD-10-CM | POA: Insufficient documentation

## 2013-10-12 LAB — BASIC METABOLIC PANEL
BUN: 23 mg/dL (ref 6–23)
CO2: 23 mEq/L (ref 19–32)
CREATININE: 1.54 mg/dL — AB (ref 0.50–1.10)
Calcium: 9.5 mg/dL (ref 8.4–10.5)
Chloride: 106 mEq/L (ref 96–112)
GFR, EST AFRICAN AMERICAN: 48 mL/min — AB (ref >90)
GFR, EST NON AFRICAN AMERICAN: 41 mL/min — AB (ref >90)
GLUCOSE: 250 mg/dL — AB (ref 70–99)
Potassium: 4.7 mEq/L (ref 3.7–5.3)
Sodium: 139 mEq/L (ref 137–147)

## 2013-10-12 LAB — CBC
HEMATOCRIT: 36 % (ref 36.0–46.0)
HEMOGLOBIN: 12.1 g/dL (ref 12.0–15.0)
MCH: 30.6 pg (ref 26.0–34.0)
MCHC: 33.6 g/dL (ref 30.0–36.0)
MCV: 91.1 fL (ref 78.0–100.0)
Platelets: 186 10*3/uL (ref 150–400)
RBC: 3.95 MIL/uL (ref 3.87–5.11)
RDW: 13 % (ref 11.5–15.5)
WBC: 7.4 10*3/uL (ref 4.0–10.5)

## 2013-10-12 LAB — I-STAT TROPONIN, ED
TROPONIN I, POC: 0.01 ng/mL (ref 0.00–0.08)
Troponin i, poc: 0 ng/mL (ref 0.00–0.08)

## 2013-10-12 MED ORDER — POLYETHYLENE GLYCOL 3350 17 G PO PACK: 17.0000 g | PACK | Freq: Every day | ORAL | Status: DC

## 2013-10-12 MED ORDER — GI COCKTAIL ~~LOC~~
30.0000 mL | Freq: Once | ORAL | Status: AC
  Administered 2013-10-12: 30 mL via ORAL
  Filled 2013-10-12: qty 30

## 2013-10-12 MED ORDER — POLYETHYLENE GLYCOL 3350 17 G PO PACK: 34.0000 g | PACK | Freq: Every day | ORAL | Status: AC

## 2013-10-12 NOTE — ED Notes (Signed)
Report given to [place], EMT

## 2013-10-12 NOTE — Patient Instructions (Signed)
Please go directly to the emergency room to be evaluated for your chest pain.

## 2013-10-12 NOTE — ED Provider Notes (Signed)
CSN: [id number]     Arrival date & time 10/12/13  1618 History   First MD Initiated Contact with Patient 10/12/13 1730     Chief Complaint  Patient presents with  . Chest Pain   In addition to what is listed below. Patient is a 38 y.o. female with past medical history relevant for schizoaffective disorder, SVT, and anemia who presents with chest pain. Patient states that she's had intermittent bouts of chest pain over the last few days. Currently she is asymptomatic. She denies any associated nausea vomiting diaphoresis or shortness of breath.  When asked about BMs patient daily BMs but stool is hard.    (Consider location/radiation/quality/duration/timing/severity/associated sxs/prior Treatment) Patient is a 38 y.o. female presenting with chest pain. The history is provided by the patient and medical records. No language interpreter was used.  Chest Pain Pain location:  Substernal area Pain quality: aching   Pain radiates to:  Does not radiate Pain radiates to the back: no   Pain severity:  Mild Onset quality:  Gradual Timing:  Intermittent Progression:  Worsening Chronicity:  New Relieved by:  Nothing Worsened by:  Nothing tried Associated symptoms: no abdominal pain, no back pain, no cough, no fatigue, no fever, no nausea and not vomiting   Risk factors: no coronary artery disease     Past Medical History  Diagnosis Date  . Psychic disease   . Schizoaffective disorder   . Patient overweight   . Anemia   . Internal hemorrhoid, bleeding 10/22/2010  . SVT (supraventricular tachycardia)     with short PR interval - followed by Dr. Lovena Le  . Obesity    Past Surgical History  Procedure Laterality Date  . Breast reduction surgery  08/10/2005  . Hysteroscopy  09/09/2004    w/ removal of polyps   Family History  Problem Relation Age of Onset  . Schizophrenia Sister    History  Substance Use Topics  . Smoking status: Never Smoker   . Smokeless tobacco: Never Used  . Alcohol  Use: No   OB History   Grav Para Term Preterm Abortions TAB SAB Ect Mult Living                 Review of Systems  Constitutional: Negative for fever and fatigue.  Respiratory: Negative for cough.   Cardiovascular: Positive for chest pain.  Gastrointestinal: Negative for nausea, vomiting and abdominal pain.  Musculoskeletal: Negative for back pain.  All other systems reviewed and are negative.     Allergies  Minocycline  Home Medications   Prior to Admission medications   Medication Sig Start Date End Date Taking? Authorizing Provider  acetaminophen (TYLENOL) 500 MG tablet Take 500 mg by mouth. As needed for pain    Yes Historical Provider, MD  adapalene (DIFFERIN) 0.1 % gel APPLY AT BEDTIME 05/11/13  Yes Leeanne Rio, MD  aspirin 81 MG chewable tablet Chew 1 tablet (81 mg total) by mouth daily. 05/11/13  Yes Leeanne Rio, MD  atorvastatin (LIPITOR) 20 MG tablet Take 1 tablet (20 mg total) by mouth daily. 08/17/13  Yes Leeanne Rio, MD  benztropine (COGENTIN) 1 MG tablet Take 1 mg by mouth 2 (two) times daily.     Yes Historical Provider, MD  cetaphil (CETAPHIL) lotion Apply topically as needed. 02/14/13  Yes Leeanne Rio, MD  clindamycin (CLINDAGEL) 1 % gel Apply topically daily. 05/11/13  Yes Leeanne Rio, MD  clozapine (FAZACLO) 100 MG disintegrating tablet Take 100 mg  by mouth. Dissolve four tablets in mouth at bedtime    Yes Historical Provider, MD  divalproex (DEPAKOTE) 500 MG 24 hr tablet Take 500 mg by mouth 2 (two) times daily at 10 AM and 5 PM.    Yes Historical Provider, MD  famotidine (PEPCID) 20 MG tablet TAKE ONE TABLET TWICE DAILY 05/11/13  Yes Leeanne Rio, MD  glipiZIDE (GLUCOTROL) 2.5 mg TABS tablet Take 0.5 tablets (2.5 mg total) by mouth daily before breakfast. 08/10/13  Yes Leeanne Rio, MD  lisinopril (PRINIVIL,ZESTRIL) 2.5 MG tablet Take 1 tablet (2.5 mg total) by mouth daily. 08/11/13  Yes Leeanne Rio, MD   medroxyPROGESTERone (DEPO-PROVERA) 150 MG/ML injection Inject 1 mL (150 mg total) into the muscle every 3 (three) months. 01/21/13 01/21/14 Yes Leeanne Rio, MD  metoprolol succinate (TOPROL-XL) 50 MG 24 hr tablet Take 50 mg by mouth daily. Take with or immediately following a meal.   Yes Historical Provider, MD  PARoxetine (PAXIL) 30 MG tablet Take 30 mg by mouth. 2 by mouth at bedtime    Yes Historical Provider, MD  polyethylene glycol powder (GLYCOLAX/MIRALAX) powder 17 gm BY MOUTH DAILY AT BEDTIME- CAN INCREASE TO TWICE DAILY IF ANY CONSTIPATION 05/11/13  Yes Leeanne Rio, MD  simethicone [facility]) 161 MG chewable tablet 1 tab by mouth daily as needed for gas 01/27/13  Yes Leeanne Rio, MD  Blood Glucose Monitoring Suppl (PRODIGY AUTOCODE BLOOD GLUCOSE) DEVI by Does not apply route. Use as directed     Historical Provider, MD  docusate sodium (DOK) 100 MG capsule TAKE ONE CAPSULE EACH DAY 05/11/13   Leeanne Rio, MD  glucose blood (ACCU-CHEK AVIVA PLUS) test strip AS DIRECTED 08/10/13   Leeanne Rio, MD  Lancets (ACCU-CHEK MULTICLIX) lancets AS DIRECTED 08/10/13   Leeanne Rio, MD   BP 114/72  Pulse 96  Temp(Src) 98.3 F (36.8 C) (Oral)  Resp 18  SpO2 99% Physical Exam  Nursing note and vitals reviewed. Constitutional: She is oriented to person, place, and time. She appears well-developed and well-nourished.  HENT:  Head: Normocephalic and atraumatic.  Right Ear: External ear normal.  Left Ear: External ear normal.  Nose: Nose normal.  Mouth/Throat: Oropharynx is clear and moist.  Eyes: Conjunctivae are normal. Pupils are equal, round, and reactive to light.  Neck: Normal range of motion. Neck supple.  Cardiovascular: Normal rate and regular rhythm.   Pulmonary/Chest: Effort normal and breath sounds normal. No respiratory distress. She has no wheezes.  Abdominal: Soft. Bowel sounds are normal. She exhibits no distension. There is tenderness (mild  epigastric).  Musculoskeletal: Normal range of motion. She exhibits no edema and no tenderness.  Neurological: She is alert and oriented to person, place, and time. She has normal reflexes.  Skin: Skin is warm and dry.  Psychiatric: She has a normal mood and affect.    ED Course  Procedures (including critical care time) Labs Review Labs Reviewed  CBC  BASIC METABOLIC PANEL  Randolm Idol, ED    Imaging Review Dg Chest 2 View  10/12/2013   CLINICAL DATA:  Chest pain and weakness.  EXAM: CHEST  2 VIEW  COMPARISON:  None.  FINDINGS: The lungs are clear. Heart size is normal. There is no pneumothorax or pleural effusion. There is a massive volume of stool in the visualized colon.  IMPRESSION: No acute cardiopulmonary disease.  Massive volume of stool in the visualized colon.   Electronically Signed   By: Marcello Moores  Dalessio M.D.   On: 10/12/2013 17:12     EKG Interpretation None        Date: 10/12/2013  Rate: 91  Rhythm: normal sinus rhythm  QRS Axis: normal  Intervals: normal  ST/T Wave abnormalities: t wave inv in inferior and lateral leads; seen on prior EKGs  Conduction Disutrbances:none  Narrative Interpretation:   Old EKG Reviewed: unchanged    MDM   Final diagnoses:  Chest pain  Constipation    Patient sent to the emergency department with atypical chest pain. Her vital signs were unremarkable including no fever, no tachycardia, no hypotension. Physical exam as above and remarkable for no signs of volume overload, no abdominal tenderness to palpation. EKG done in triage shows T wave inversions in inferior and lateral leads but when compared to prior EKGs these are unchanged. Workup for possible ACS included troponin which was within normal limits. She was given GI cocktail for symptomatic relief. It was felt the patient was low risk by heart score and delta troponin was appropriate to rule out any evidence of cardiac ischemia. Repeat troponin within normal limits.  Given the atypical nature and workup will send home with return precautions and PCP followup.  Will also add miralax given reported hard stool and CXR findings.      Corlis Leak, MD 10/12/13 2351

## 2013-10-12 NOTE — Discharge Instructions (Signed)
Chest Pain (Nonspecific) °It is often hard to give a specific diagnosis for the cause of chest pain. There is always a chance that your pain could be related to something serious, such as a heart attack or a blood clot in the lungs. You need to follow up with your caregiver for further evaluation. °CAUSES  °· Heartburn. °· Pneumonia or bronchitis. °· Anxiety or stress. °· Inflammation around your heart (pericarditis) or lung (pleuritis or pleurisy). °· A blood clot in the lung. °· A collapsed lung (pneumothorax). It can develop suddenly on its own (spontaneous pneumothorax) or from injury (trauma) to the chest. °· Shingles infection (herpes zoster virus). °The chest wall is composed of bones, muscles, and cartilage. Any of these can be the source of the pain. °· The bones can be bruised by injury. °· The muscles or cartilage can be strained by coughing or overwork. °· The cartilage can be affected by inflammation and become sore (costochondritis). °DIAGNOSIS  °Lab tests or other studies, such as X-rays, electrocardiography, stress testing, or cardiac imaging, may be needed to find the cause of your pain.  °TREATMENT  °· Treatment depends on what may be causing your chest pain. Treatment may include: °· Acid blockers for heartburn. °· Anti-inflammatory medicine. °· Pain medicine for inflammatory conditions. °· Antibiotics if an infection is present. °· You may be advised to change lifestyle habits. This includes stopping smoking and avoiding alcohol, caffeine, and chocolate. °· You may be advised to keep your head raised (elevated) when sleeping. This reduces the chance of acid going backward from your stomach into your esophagus. °· Most of the time, nonspecific chest pain will improve within 2 to 3 days with rest and mild pain medicine. °HOME CARE INSTRUCTIONS  °· If antibiotics were prescribed, take your antibiotics as directed. Finish them even if you start to feel better. °· For the next few days, avoid physical  activities that bring on chest pain. Continue physical activities as directed. °· Do not smoke. °· Avoid drinking alcohol. °· Only take over-the-counter or prescription medicine for pain, discomfort, or fever as directed by your caregiver. °· Follow your caregiver's suggestions for further testing if your chest pain does not go away. °· Keep any follow-up appointments you made. If you do not go to an appointment, you could develop lasting (chronic) problems with pain. If there is any problem keeping an appointment, you must call to reschedule. °SEEK MEDICAL CARE IF:  °· You think you are having problems from the medicine you are taking. Read your medicine instructions carefully. °· Your chest pain does not go away, even after treatment. °· You develop a rash with blisters on your chest. °SEEK IMMEDIATE MEDICAL CARE IF:  °· You have increased chest pain or pain that spreads to your arm, neck, jaw, back, or abdomen. °· You develop shortness of breath, an increasing cough, or you are coughing up blood. °· You have severe back or abdominal pain, feel nauseous, or vomit. °· You develop severe weakness, fainting, or chills. °· You have a fever. °THIS IS AN EMERGENCY. Do not wait to see if the pain will go away. Get medical help at once. Call your local emergency services (911 in [place] Do not drive yourself to the hospital. °MAKE SURE YOU:  °· Understand these instructions. °· Will watch your condition. °· Will get help right away if you are not doing well or get worse. °Document Released: 02/05/2005 Document Revised: 07/21/2011 Document Reviewed: 12/02/2007 °ExitCare® Patient Information ©2014 [employer]   [employer]. ° °

## 2013-10-12 NOTE — ED Notes (Signed)
Pt undressed, in gown, on monitor, continuous pulse oximetry and blood pressure cuff; caregiver at bedside

## 2013-10-12 NOTE — ED Notes (Signed)
Pt poor historian but reports intermittent cental chest "heaviness" since today. Denies SOB, N/V, diaphoresis. Reports mild pain at this time. AO x4.

## 2013-10-14 NOTE — ED Provider Notes (Signed)
Medical screening examination/treatment/procedure(s) were conducted as a shared visit with non-physician practitioner(s) or resident  and myself.  I personally evaluated the patient during the encounter and agree with the findings and plan unless otherwise indicated.    I have personally reviewed any xrays and/ or EKG's with the provider and I agree with interpretation.   Patient presents with atypical intermittent chest pain for the past 23 days. Patient has had this in the past. Patient denies exertional, pleuritic or diaphoresis. Patient has had issues with BMs recently. On exam patient well-appearing, lungs clear, regular rate and rhythm without murmur, no leg swelling or pain. Patient low risk cardiac in troponin negative with EKG similar previous. Patient chest pain-free on my reeval and followup outpatient discussed.  Atypical chest pain  Mariea Clonts, MD 10/14/13 [phone]

## 2013-10-16 NOTE — Progress Notes (Signed)
Patient ID: [name], female   DOB: Oct 02, 1975, 38 y.o.   MRN: [id number]  HPI:  Diabetes: due for an eye exam this month. Fasting CBG's have been 150's to 190's. Currently taking glipzide 2.5 mg daily.   Constipation: states her bowels are hard. Recently started using miralax every other day and since then bowels have been really hard and she's had to strain. Had a small amount of blood on the toilet paper once. Denies dizziness or lightheadedness.  Chest discomfort: felt heavy in her chest earlier today. Is not short of breath. The heavy feeling lasted just a little while and occurred while she was sitting reading a book at school. It happened about one pm. Initially she denies having pain during the clinic visit, but then later endorses a heavy feeling in her chest.  ROS: See HPI  [place]: hx schizophrenia, T2DM, mental retardation  PHYSICAL EXAM: BP 103/70  Pulse 99  Ht 5' 5"  (1.651 m)  Wt 185 lb (83.915 kg)  BMI 30.79 kg/m2 Gen: NAD, pleasant and cooperative HEENT: NCAT, MMM Heart: RRR, no murmurs Lungs: CTAB, NWOB Abdomen: soft, nontender to palpation Neuro: grossly nonfocal and at pt's baseline Ext: 2+ DP pulses bilaterally. Decreased sensation over bilateral plantar aspects of feet with monofilament testing. No skin breakdown or sores.  ASSESSMENT/PLAN:  # Chest pain: EKG performed today and was nonischemic, but given that pt was actively having chest pain, she was sent directly to the ER for further evaluation. I doubt this was ACS but given her risk factors, she was sent to the ER out of an abundance of caution.  See problem based charting for additional assessment/plan.  FOLLOW UP: F/u as soon as possible for post-ER f/u and to discuss other items we started to address today.  [place]. Ardelia Mems, [place]

## 2013-10-16 NOTE — Assessment & Plan Note (Signed)
Poorly controlled as pt is straining hard and had blood on toilet paper once. Advised pt to increase dosing to miralax 17g every day. If rectal bleeding persists will need further evaluation. Will need to address this problem with pt upon ER f/u visit since she was sent to the ER today for active chest pain.

## 2013-10-16 NOTE — Assessment & Plan Note (Signed)
As fasting CBG's are 150's to 190's pt should be on higher dosage of glipizide. Was planning to increase to 17m glipizide daily, but pt developed CP and was sent to the ER. Will discuss with pt upon f/u after her ER visit.  Cardiac: on statin, aspirin Renal: on ACE Eye: due for visit this month to eye doctor, reminded pt of this Foot: exam done today, shows decreased sensation with monofilament testing on bilateral plantar surface of feet. Advised pt to examine her feet daily for any signs of skin breakdown or infection.

## 2013-10-17 ENCOUNTER — Other Ambulatory Visit: Payer: Self-pay | Admitting: Family Medicine

## 2013-10-17 MED ORDER — GLIPIZIDE 5 MG PO TABS: 5.0000 mg | ORAL_TABLET | Freq: Every day | ORAL | Status: DC

## 2013-10-17 MED ORDER — POLYETHYLENE GLYCOL 3350 17 GM/SCOOP PO POWD: ORAL | Status: DC

## 2013-10-24 ENCOUNTER — Ambulatory Visit (INDEPENDENT_AMBULATORY_CARE_PROVIDER_SITE_OTHER): Payer: Medicaid Other

## 2013-10-24 VITALS — BP 96/69 | HR 100 | Temp 98.0°F | Wt 183.0 lb

## 2013-10-24 DIAGNOSIS — N949 Unspecified condition associated with female genital organs and menstrual cycle: Secondary | ICD-10-CM

## 2013-10-24 DIAGNOSIS — N925 Other specified irregular menstruation: Secondary | ICD-10-CM

## 2013-10-24 DIAGNOSIS — N938 Other specified abnormal uterine and vaginal bleeding: Secondary | ICD-10-CM

## 2013-10-24 MED ORDER — MEDROXYPROGESTERONE ACETATE 104 MG/0.65ML ~~LOC~~ SUSP
104.0000 mg | Freq: Once | SUBCUTANEOUS | Status: AC
  Administered 2013-10-24: 104 mg via SUBCUTANEOUS

## 2013-10-24 NOTE — Progress Notes (Signed)
Patient here today for depo provera. Reports injection is working well and not bleeding at all. Depo provera 135m IM injected into left arm. Patient tolerated well. Information sheet for group home filled out and returned to patient. Patient to return between 01/15/14 and 01/31/14 for next injection.

## 2013-11-09 ENCOUNTER — Ambulatory Visit (INDEPENDENT_AMBULATORY_CARE_PROVIDER_SITE_OTHER): Payer: Medicaid Other | Admitting: Family Medicine

## 2013-11-09 VITALS — BP 117/77 | HR 108 | Temp 98.3°F | Ht 65.0 in | Wt 182.4 lb

## 2013-11-09 DIAGNOSIS — K5909 Other constipation: Secondary | ICD-10-CM

## 2013-11-09 DIAGNOSIS — N189 Chronic kidney disease, unspecified: Secondary | ICD-10-CM

## 2013-11-09 DIAGNOSIS — E1122 Type 2 diabetes mellitus with diabetic chronic kidney disease: Secondary | ICD-10-CM

## 2013-11-09 DIAGNOSIS — E1129 Type 2 diabetes mellitus with other diabetic kidney complication: Secondary | ICD-10-CM

## 2013-11-09 LAB — POCT GLYCOSYLATED HEMOGLOBIN (HGB A1C): Hemoglobin A1C: 8.9

## 2013-11-09 NOTE — Patient Instructions (Signed)
It was great to see you again today!  I'm glad everything is doing better. We will check A1c today and call you if we need to make any changes to medicines. Return in 3 months for a follow up visit.  Be well, [name]

## 2013-11-09 NOTE — Assessment & Plan Note (Signed)
Due for A1c today. Will check this and adjust medications as necessary (although has not been on increased dose of glipizide for even a full month, so will likely not make any changes).  Cardiac: on statin, aspirin Renal: on ACE Eye: due for eye exam, needs referral, caregiver will let us know which doctor she would like to be referred to Foot: done 10/12/2013 with decreased sensation to feet

## 2013-11-09 NOTE — Assessment & Plan Note (Signed)
On miralax 17 g daily. Improved with no further episodes of rectal bleeding. Stools now soft. Will continue with this regimen. Advised caregiver to have Merrill return to see me if she has any other episodes of bleeding.

## 2013-11-09 NOTE — Progress Notes (Signed)
Patient ID: [name], female   DOB: 1976/04/18, 38 y.o.   MRN: [id number]  HPI:  Diabetes: Due for eye exam. Patient's caregiver question referral for new eye doctor since hers is retiring. She'll bring [place] the name of the doctor she wants her to go to. She's been taking glipizide 5 mg daily. No problems with this medication. Fasting sugars are still elevated in the high 100s. She denies any chest pain. Due for A1c today.  Constipation: Has been taking MiraLAX 17 g daily. We increased it to daily from every other day at her last visit because she was complaining of straining to stool, and had one small episode of rectal bleeding with stooling. She reports that her bowels are now moving well. Her stool comes out easily. She's had no further rectal bleeding.  Physical: Caregiver requests that I fill out patient's physical form, which is required by her caregiving company.  ROS: See HPI. Previously was sent to ER for chest pain. She denies any recent chest pain.  [place]: History of diabetes with renal complications  PHYSICAL EXAM: BP 117/77  Pulse 108  Temp(Src) 98.3 F (36.8 C) (Oral)  Ht 5' 5"  (1.651 m)  Wt 182 lb 6.4 oz (82.736 kg)  BMI 30.35 kg/m2 Gen: NAD, pleasant, cooperative HEENT: NCAT, MMM, TM's clear bilaterally, no palpable thyromegaly or cervical lymphadenopathy Heart: mildly tachycardic no murmurs, regular rhythm Lungs: CTAB, NWOB Abdomen: soft, nontender to palpation, no palpable masses or organomegaly Neuro: grossly nonfocal and at pt's baseline, PERRL, EOMI Ext: No appreciable lower extremity edema bilaterally   ASSESSMENT/PLAN:  # Health maintenance:  -will complete physical form and notify caregiver when it is ready. -need to address pneumonia shot and mammogram at future visit.  See problem based charting for additional assessment/plan.  FOLLOW UP: F/u in 3 months for diabetes.  [place]. Ardelia Mems, [place]

## 2013-11-10 ENCOUNTER — Other Ambulatory Visit: Payer: Self-pay | Admitting: Family Medicine

## 2013-11-10 ENCOUNTER — Encounter: Payer: Self-pay | Admitting: Family Medicine

## 2013-11-10 DIAGNOSIS — E1122 Type 2 diabetes mellitus with diabetic chronic kidney disease: Secondary | ICD-10-CM

## 2013-11-10 NOTE — Progress Notes (Signed)
Pt's caregiver dropped off paperwork to be filled out regarding FL-2 and referral for an eye doctor also given [name] an letter and a list of eye dr's.

## 2013-11-10 NOTE — Progress Notes (Signed)
Form placed in PCP box for completion.[name], [name]

## 2013-11-10 NOTE — Progress Notes (Signed)
Form completed, will place on Wallace RN's desk. Also will include completed physical form, which [name] had given me previously to fill out. Will enter eye referral into epic.  Leeanne Rio, MD

## 2013-11-14 ENCOUNTER — Telehealth: Payer: Self-pay | Admitting: Family Medicine

## 2013-11-14 NOTE — Telephone Encounter (Signed)
Referral to the eye dr was not in the packet of information left for the caregiver Please call when it is ready

## 2013-11-14 NOTE — Progress Notes (Signed)
Pt's caregiver [name] informed that eye referral placed and form is completed and ready for pick up.  Derl Barrow, RN

## 2013-11-17 NOTE — Telephone Encounter (Signed)
I entered the referral into epic. We don't usually print off a referral. Routing to [place] who can help clarify. Thanks, [place]

## 2013-11-30 ENCOUNTER — Ambulatory Visit (INDEPENDENT_AMBULATORY_CARE_PROVIDER_SITE_OTHER): Payer: Medicaid Other | Admitting: Family Medicine

## 2013-11-30 ENCOUNTER — Encounter: Payer: Self-pay | Admitting: Family Medicine

## 2013-11-30 VITALS — BP 125/90 | HR 73 | Temp 98.2°F | Wt 184.0 lb

## 2013-11-30 DIAGNOSIS — E1129 Type 2 diabetes mellitus with other diabetic kidney complication: Secondary | ICD-10-CM

## 2013-11-30 DIAGNOSIS — Z8679 Personal history of other diseases of the circulatory system: Secondary | ICD-10-CM

## 2013-11-30 MED ORDER — GLIPIZIDE 10 MG PO TABS: 10.0000 mg | ORAL_TABLET | Freq: Every day | ORAL | Status: DC

## 2013-11-30 NOTE — Assessment & Plan Note (Signed)
Fasting sugars consistently over 200. Unable to take metformin due to hx of CKD. Will increase glipizide to 50m daily. F/u in 3-4 weeks to review sugar log and consider further increase. If possible would like to avoid insulin in this patient who has mental retardation and schizophrenia.

## 2013-11-30 NOTE — Patient Instructions (Signed)
It was great to see you again today!  Diabetes: -start glipizide 63m daily (increased from 563mdaily) -continue checking blood sugars in the morning -follow up in 3-4 weeks so we can see how the sugars are doing with this higher dose  For heart problems -she's due to f/u with her cardiologist now -call and schedule an appt with Dr. TaLovena LeI will see you in 3-4 weeks.  Be well, Dr. [name]

## 2013-11-30 NOTE — Assessment & Plan Note (Signed)
Vague sx's of fast heartbeat and heavy feeling in chest, hx difficult due to decreased cognition. No pain at present. She is due for f/u with her cardiologist, so I've instructed [name] to call Dr. Tanna Furry office and schedule an appointment. They are agreeable with this plan.

## 2013-11-30 NOTE — Progress Notes (Signed)
Patient ID: [name], female   DOB: 1975-05-28, 38 y.o.   MRN: [id number]  HPI:  Pt presents for a same day appointment to discuss elevated blood sugars. She is accompanied by her caretaker [name].  Has been checking sugars 1-2x daily and has consistently been getting fasting sugars of over 200. Currently only on glipizide 37m daily.  Of note, caregiver also reports she recently went to the eye doctor, who was suspicious for glaucoma. Her eye doctor is Dr. RMarylynn Pearson She has f/u scheduled in October to discuss treatment for this.  Pt endorses occasional fast heartbeat and heavy feeling in her chest. History is difficult to obtain secondary to her mental retardation. Denies being in pain currently.  ROS: See HPI  [place] hx T2DM, schizophrenia, MR, chronic constipation, CKD stage 3, tachycardia followed by Dr. TLovena Le PHYSICAL EXAM: BP 125/90  Pulse 73  Temp(Src) 98.2 F (36.8 C) (Oral)  Wt 184 lb (83.462 kg) Gen: NAD HEENT: NCAT Heart: RRR Lungs: CTAB, NWOB Neuro: at pt's baseline, grossly nonfocal  ASSESSMENT/PLAN:  See problem based charting for assessment/plan  FOLLOW UP: F/u in 3-4 weeks for further titration of glipizide Schedule appt with cardiology separately  [place]. MArdelia Mems MLos [place]

## 2013-12-28 ENCOUNTER — Ambulatory Visit (INDEPENDENT_AMBULATORY_CARE_PROVIDER_SITE_OTHER): Payer: Medicaid Other | Admitting: Family Medicine

## 2013-12-28 ENCOUNTER — Encounter: Payer: Self-pay | Admitting: Family Medicine

## 2013-12-28 VITALS — BP 89/65 | HR 108 | Temp 98.3°F | Ht 65.0 in | Wt 182.6 lb

## 2013-12-28 DIAGNOSIS — E1129 Type 2 diabetes mellitus with other diabetic kidney complication: Secondary | ICD-10-CM

## 2013-12-28 MED ORDER — GLIPIZIDE 10 MG PO TABS: 10.0000 mg | ORAL_TABLET | Freq: Two times a day (BID) | ORAL | Status: DC

## 2013-12-28 MED ORDER — ACETAMINOPHEN 500 MG PO TABS: 500.0000 mg | ORAL_TABLET | Freq: Three times a day (TID) | ORAL | Status: DC | PRN

## 2013-12-28 NOTE — Patient Instructions (Addendum)
It was great to see you again today!  For diabetes: -increase glipizide to 83m twice a day -come back in 3-4 weeks to follow up on how the sugars are doing  For blood pressure: -your BP was a little low today -let me know if you are having any dizziness or lightheadedness. If this happens, STOP the lisinopril.  I will see you in 3-4 weeks. Call with any questions.  Be well, Dr. MArdelia Mems

## 2013-12-29 ENCOUNTER — Other Ambulatory Visit: Payer: Self-pay | Admitting: Family Medicine

## 2014-01-01 NOTE — Assessment & Plan Note (Signed)
Still having high fasting sugars. Will titrate glipizide up to 52m BID. Follow up in 3-4 weeks to follow up on how her sugars are doing.  Note: BP noted to be low today at 89/65, but pt was asymptomatic. Orthostatics were negative and BP actually rose on its own to 99/75 while lying down, and went up while sitting/standing. Pt is on lisinopril for renal protection given her diabetes and known CKD. Have instructed her to STOP the lisinopril if she feels lightheaded at any point. Pt and caregiver understood these instructions.

## 2014-01-01 NOTE — Progress Notes (Signed)
Patient ID: [name], female   DOB: 04/24/1976, 38 y.o.   MRN: [id number]  HPI:  Diabetes: currently taking glipizide 53m once daily. Checks blood sugars and gets high 100's fasting every time. Denies dizziness, lightheadedness, chest pain, or shortness of breath. Otherwise feels well.  ROS: See HPI.  [place] hx MR, schizophrenia, CKD stage III, chronic constipation, T2DM  PHYSICAL EXAM: BP 89/65  Pulse 108  Temp(Src) 98.3 F (36.8 C) (Oral)  Ht 5' 5"  (1.651 m)  Wt 182 lb 9.6 oz (82.827 kg)  BMI 30.39 kg/m2 Orthostatics: lying 99/75 pulse 92, sitting 104/75 pulse 100, standing 104/72 pulse 102 Gen: NAD HEENT: NCAT Heart: RRR Lungs: CTAB, NWOB Neuro: grossly nonfocal and at pt's baseline  ASSESSMENT/PLAN:  DIABETES MELLITUS, WITH RENAL COMPLICATIONS Still having high fasting sugars. Will titrate glipizide up to 127mBID. Follow up in 3-4 weeks to follow up on how her sugars are doing.  Note: BP noted to be low today at 89/65, but pt was asymptomatic. Orthostatics were negative and BP actually rose on its own to 99/75 while lying down, and went up while sitting/standing. Pt is on lisinopril for renal protection given her diabetes and known CKD. Have instructed her to STOP the lisinopril if she feels lightheaded at any point. Pt and caregiver understood these instructions.   FOLLOW UP: F/u in 3-4 weeks for titration of diabetes medicine.  Jacqueline CenterMcArdelia MemsMDLe [place]

## 2014-01-05 ENCOUNTER — Ambulatory Visit (INDEPENDENT_AMBULATORY_CARE_PROVIDER_SITE_OTHER): Payer: Medicaid Other | Admitting: Internal Medicine

## 2014-01-05 ENCOUNTER — Other Ambulatory Visit: Payer: Self-pay | Admitting: Family Medicine

## 2014-01-05 ENCOUNTER — Encounter: Payer: Self-pay | Admitting: Internal Medicine

## 2014-01-05 VITALS — HR 97 | Ht 62.0 in | Wt 185.4 lb

## 2014-01-05 DIAGNOSIS — Z8679 Personal history of other diseases of the circulatory system: Secondary | ICD-10-CM

## 2014-01-05 DIAGNOSIS — E663 Overweight: Secondary | ICD-10-CM

## 2014-01-05 DIAGNOSIS — R42 Dizziness and giddiness: Secondary | ICD-10-CM | POA: Insufficient documentation

## 2014-01-05 MED ORDER — METOPROLOL TARTRATE 25 MG PO TABS: 25.0000 mg | ORAL_TABLET | Freq: Every day | ORAL | Status: DC

## 2014-01-05 NOTE — Assessment & Plan Note (Signed)
Her blood pressure has been low. We will reduce her dose of beta blocker, and discontinue lisinopril. Hopefully her dizziness will improve.

## 2014-01-05 NOTE — Assessment & Plan Note (Signed)
Her SVT is well controlled. Because her blood pressure has been on the low side, I've asked the patient to reduce her dose of beta blocker from 50 mg daily to 25 mg daily.

## 2014-01-05 NOTE — Progress Notes (Signed)
HPI Ms. Gattuso returns today for followup after a long absence from our arrhythmia clinic. She is a very pleasant 38 year old woman with a history of tachycardia palpitations and SVT. The patient has not had any recurrent arrhythmias in several months. She is not a particularly good historian, as she has schizoaffective disorder. She is heavily medicated. She has not been hospitalized. She does have episodes where she feels dizzy, and her caretaker notes that she lays around during the day and is very inactive. She has gained weight. Allergies  Allergen Reactions  . Minocycline     unknown     Current Outpatient Prescriptions  Medication Sig Dispense Refill  . acetaminophen (TYLENOL) 500 MG tablet Take 1 tablet (500 mg total) by mouth every 8 (eight) hours as needed for mild pain or fever.  30 tablet  0  . aspirin 81 MG chewable tablet Chew 1 tablet (81 mg total) by mouth daily.  30 tablet  11  . atorvastatin (LIPITOR) 20 MG tablet Take 1 tablet (20 mg total) by mouth daily.  90 tablet  3  . benztropine (COGENTIN) 1 MG tablet Take 1 mg by mouth 2 (two) times daily.        . cetaphil (CETAPHIL) lotion Apply topically as needed for dry skin.      . clindamycin (CLINDAGEL) 1 % gel Apply topically daily.  30 g  1  . clozapine (FAZACLO) 100 MG disintegrating tablet Dissolve four tablets in mouth at bedtime      . DIFFERIN 0.1 % gel APPLY AT BEDTIME  45 g  3  . divalproex (DEPAKOTE) 500 MG 24 hr tablet Take 500 mg by mouth 2 (two) times daily at 10 AM and 5 PM.       . docusate sodium (DOK) 100 MG capsule TAKE ONE CAPSULE EACH DAY  30 capsule  5  . famotidine (PEPCID) 20 MG tablet TAKE ONE TABLET TWICE DAILY  60 tablet  5  . glipiZIDE (GLUCOTROL) 10 MG tablet Take 10 mg by mouth daily before breakfast.      . medroxyPROGESTERone (DEPO-PROVERA) 150 MG/ML injection Inject 1 mL (150 mg total) into the muscle every 3 (three) months.      Marland Kitchen PARoxetine (PAXIL) 30 MG tablet Take 60 mg by mouth  at bedtime.       . polyethylene glycol powder (GLYCOLAX/MIRALAX) powder 17 gm BY MOUTH DAILY AT BEDTIME- CAN INCREASE TO TWICE DAILY IF ANY CONSTIPATION  527 g  3  . Simethicone (GAS-X PO) Take 1 tablet by mouth daily as needed (gas).      . metoprolol tartrate (LOPRESSOR) 25 MG tablet Take 1 tablet (25 mg total) by mouth daily.  90 tablet  3   No current facility-administered medications for this visit.     Past Medical History  Diagnosis Date  . Psychic disease   . Schizoaffective disorder   . Patient overweight   . Anemia   . Internal hemorrhoid, bleeding 10/22/2010  . SVT (supraventricular tachycardia)     with short PR interval - followed by Dr. Lovena Le  . Obesity     ROS:   All systems reviewed and negative except as noted in the HPI.   Past Surgical History  Procedure Laterality Date  . Breast reduction surgery  08/10/2005  . Hysteroscopy  09/09/2004    w/ removal of polyps     Family History  Problem Relation Age of Onset  . Schizophrenia Sister  History   Social History  . Marital Status: Single    Spouse Name: N/A    Number of Children: N/A  . Years of Education: N/A   Occupational History  . Not on file.   Social History Main Topics  . Smoking status: Never Smoker   . Smokeless tobacco: Never Used  . Alcohol Use: No  . Drug Use: No  . Sexual Activity: No   Other Topics Concern  . Not on file   Social History Narrative   Caregiver is Morrell Riddle. [place] [facility] [place]. Cell [phone]/[id number] lived in group home. Twin sister with similar problem list. Has 24H care.      Case manager is Justin Mend, [phone].      Caregiver works for [employer], [address] [place], [place] [address]. Abbey Chatters is clinical supervisor, [phone].      Sammuel Bailiff is [employer] [place] [employer] [place] [employer] [place]., [phone] office. Cell [phone].     Pulse  97  Ht 5' 2"  (1.575 m)  Wt 185 lb 6.4 oz (84.097 kg)  BMI 33.90 kg/m2  Physical Exam:  Stable appearing 38 year old woman, NAD HEENT: Unremarkable Neck:  No JVD, no thyromegally Back:  No CVA tenderness Lungs:  Clear with no wheezes, rales, or rhonchi. HEART:  Regular rate rhythm, no murmurs, no rubs, no clicks Abd:  soft, positive bowel sounds, no organomegally, no rebound, no guarding Ext:  2 plus pulses, no edema, no cyanosis, no clubbing Skin:  No rashes no nodules Neuro:  CN II through XII intact, motor grossly intact  EKG -  normal sinus rhythm   Assess/Plan:

## 2014-01-05 NOTE — Assessment & Plan Note (Signed)
I asked the patient to increase her physical activity. We also discussed a low carbohydrate diet. Her caretaker states that she does not like nutritious foods, but prefers eating chips and sweet snacks. I've asked the patient to change her diet.

## 2014-01-05 NOTE — Patient Instructions (Addendum)
Your physician wants you to follow-up in: 12 months with Dr Knox Saliva will receive a reminder letter in the mail two months in advance. If you don't receive a letter, please call our office to schedule the follow-up appointment.   Your physician has recommended you make the following change in your medication:  1) Stop Lisinopril 2) Decrease Metoprolol to 74m daily

## 2014-01-17 ENCOUNTER — Ambulatory Visit (INDEPENDENT_AMBULATORY_CARE_PROVIDER_SITE_OTHER): Payer: Medicaid Other | Admitting: *Deleted

## 2014-01-17 VITALS — BP 107/80 | HR 105 | Temp 97.4°F | Wt 181.9 lb

## 2014-01-17 DIAGNOSIS — Z3049 Encounter for surveillance of other contraceptives: Secondary | ICD-10-CM

## 2014-01-17 MED ORDER — MEDROXYPROGESTERONE ACETATE 104 MG/0.65ML ~~LOC~~ SUSP
104.0000 mg | Freq: Once | SUBCUTANEOUS | Status: AC
  Administered 2014-01-17: 104 mg via SUBCUTANEOUS

## 2014-01-18 ENCOUNTER — Encounter: Payer: Self-pay | Admitting: Family Medicine

## 2014-01-18 ENCOUNTER — Ambulatory Visit (INDEPENDENT_AMBULATORY_CARE_PROVIDER_SITE_OTHER): Payer: Medicaid Other | Admitting: Family Medicine

## 2014-01-18 VITALS — BP 103/71 | HR 98 | Temp 98.5°F | Ht 65.0 in | Wt 184.7 lb

## 2014-01-18 DIAGNOSIS — Z23 Encounter for immunization: Secondary | ICD-10-CM

## 2014-01-18 DIAGNOSIS — E1129 Type 2 diabetes mellitus with other diabetic kidney complication: Secondary | ICD-10-CM

## 2014-01-18 MED ORDER — SITAGLIPTIN PHOSPHATE 50 MG PO TABS: 50.0000 mg | ORAL_TABLET | Freq: Every day | ORAL | Status: DC

## 2014-01-18 NOTE — Patient Instructions (Signed)
It was great to see you again today!  Start januvia 21m daily. Continue other medicines as they are. I sent in a prescription for januvia. Follow up in 1 month to see how sugars are doing.  Be well, Dr. MArdelia Mems

## 2014-01-18 NOTE — Progress Notes (Signed)
Patient ID: [name], female   DOB: [birth year], 38 y.o.   MRN: [id number]  HPI:  Diabetes f/u: last visit we increased glipizide to 31m BID. Tolerating this well. No concerns today. Checks sugars and gets fasting CBGs of 140-150 and above. Saw cardiologist who stopped her lisinopril and decreased beta blocker due to low BP.  ROS: See HPI.  [name] hx T2DM, schizophrenia, MR, CKD stage 3  PHYSICAL EXAM: BP 103/71  Pulse 98  Temp(Src) 98.5 F (36.9 C) (Oral)  Ht 5' 5"  (1.651 m)  Wt 184 lb 11.2 oz (83.779 kg)  BMI 30.74 kg/m2 Gen: NAD HEENT: NCAT Heart: RRR no murmurs Lungs: CTAB, NWOB Neuro: grossly nonfocal, speech normal at pt's baseline  ASSESSMENT/PLAN:  Health maintenance:  -flu shot given today   DIABETES MELLITUS, WITH RENAL COMPLICATIONS Continues to have elevated fasting CBG's but improved with upward titration of glipizide. Will add renally dosed januvia 533mdaily. F/u in 1 month to reassess, will be due for A1c then. Also wrote for low carb diet.   FOLLOW UP: F/u in 1 month for diabetes.  BrLong PointMcArdelia Prince

## 2014-01-18 NOTE — Assessment & Plan Note (Addendum)
Continues to have elevated fasting CBG's but improved with upward titration of glipizide. Will add renally dosed januvia 28m daily. F/u in 1 month to reassess, will be due for A1c then. Also wrote for low carb diet.

## 2014-01-20 ENCOUNTER — Encounter: Payer: Self-pay | Admitting: *Deleted

## 2014-01-20 NOTE — Progress Notes (Signed)
Prior Authorization received from [employer] for [employer]. PA form completed by provider.  PA pending per [place] [employer].  Confirmation number [id number] Lilia Pro, RN

## 2014-01-23 NOTE — Progress Notes (Signed)
Received PA approval for Januvia via [place] [employer].  Med approved for 01-21-15 - 9-10/16.  [facility] pharmacy informed.  PA confirmation number [id number] W.. Derl Barrow, RN

## 2014-02-22 ENCOUNTER — Ambulatory Visit (INDEPENDENT_AMBULATORY_CARE_PROVIDER_SITE_OTHER): Payer: Medicaid Other | Admitting: Family Medicine

## 2014-02-22 ENCOUNTER — Encounter: Payer: Self-pay | Admitting: Family Medicine

## 2014-02-22 VITALS — BP 127/96 | HR 104 | Temp 98.5°F | Wt 179.0 lb

## 2014-02-22 DIAGNOSIS — N189 Chronic kidney disease, unspecified: Secondary | ICD-10-CM

## 2014-02-22 DIAGNOSIS — Z23 Encounter for immunization: Secondary | ICD-10-CM

## 2014-02-22 DIAGNOSIS — E1122 Type 2 diabetes mellitus with diabetic chronic kidney disease: Secondary | ICD-10-CM

## 2014-02-22 LAB — POCT GLYCOSYLATED HEMOGLOBIN (HGB A1C): Hemoglobin A1C: 7.4

## 2014-02-22 MED ORDER — GLUCOSE BLOOD VI STRP: ORAL_STRIP | Status: DC

## 2014-02-22 MED ORDER — ACCU-CHEK AVIVA PLUS W/DEVICE KIT: PACK | Status: DC

## 2014-02-22 MED ORDER — ACCU-CHEK FASTCLIX LANCETS MISC: Status: DC

## 2014-02-22 NOTE — Progress Notes (Signed)
Patient ID: [name], female   DOB: November 11, 1975, 38 y.o.   MRN: [id number]  HPI:  Diabetes: has increased her exercise with walking. Taking glipizide 2m BID and Januvia 597mdaily. A1c 7.4. Last eye doctor appointment was a few months ago. Need appointment notes from them. Has f/u appointment scheduled with ophthalmologist, sees Dr. WhVenetia MaxonDoing well otherwise. Fasting CBG's 140's to 170's. Working on eating fewer carbs.  ROS: See HPI.  PM[place]arton Creekhx schizophrenia, mental retardation, CKD stage 3, T2DM  PHYSICAL EXAM: BP 127/96  Pulse 104  Temp(Src) 98.5 F (36.9 C) (Oral)  Wt 179 lb (81.194 kg) Gen: NAD HEENT: NCAT Heart: RRR Lungs: CTAB Neuro: at pt's baseline, grossly nonfocal, speech normal for pt Ext: atraumatic   ASSESSMENT/PLAN:  Type 2 diabetes mellitus with renal manifestations A1c 7.4 today. Excellent improvement. Continue glipizide and renally-dosed jaTongaF/u in 3 mos.  Cardiac: on statin, aspirin Renal: did not tolerate ACE previously, may try again in the future Eye: asked caregiver to have report of eye exam sent to our office Foot: due June 2016 Pneumovax given today   FOLLOW UP: F/u in 3 mos for diabetes  Br[place]. McArdelia MemsMDEast [place]

## 2014-02-22 NOTE — Patient Instructions (Signed)
It was great to see you again today!  Keep up the great work. Continue current medicines. Call eye doc and get them to send [place] the visit note from last visit.  Check blood sugar daily in the morning.  We're giving you the pneumonia vaccine today.  Follow up in 3 months.  Be well, [name]

## 2014-03-02 NOTE — Assessment & Plan Note (Addendum)
A1c 7.4 today. Excellent improvement. Continue glipizide and renally-dosed [place]. F/u in 3 mos.  Cardiac: on statin, aspirin Renal: did not tolerate ACE previously, may try again in the future Eye: asked caregiver to have report of eye exam sent to our office Foot: due June 2016 Pneumovax given today

## 2014-03-09 ENCOUNTER — Other Ambulatory Visit: Payer: Self-pay | Admitting: Family Medicine

## 2014-04-12 ENCOUNTER — Ambulatory Visit (INDEPENDENT_AMBULATORY_CARE_PROVIDER_SITE_OTHER): Payer: Medicaid Other | Admitting: General Practice

## 2014-04-12 VITALS — BP 119/91 | HR 101 | Temp 97.7°F | Ht 65.0 in | Wt 177.8 lb

## 2014-04-12 DIAGNOSIS — N938 Other specified abnormal uterine and vaginal bleeding: Secondary | ICD-10-CM

## 2014-04-12 MED ORDER — MEDROXYPROGESTERONE ACETATE 104 MG/0.65ML ~~LOC~~ SUSP
104.0000 mg | Freq: Once | SUBCUTANEOUS | Status: AC
  Administered 2014-04-12: 104 mg via SUBCUTANEOUS

## 2014-04-12 NOTE — Progress Notes (Signed)
Patient here today for depo injection. Patient has been receiving injections for about 5 years now to manage heavy bleeding. Patient states the medicine has been working but she recently spotted for only a couple days. Told patient if she has bleeding again before her next shot to let us know. May need to consider depo 145m.

## 2014-05-08 ENCOUNTER — Other Ambulatory Visit: Payer: Self-pay | Admitting: Family Medicine

## 2014-06-06 ENCOUNTER — Ambulatory Visit: Payer: Medicaid Other | Admitting: Family Medicine

## 2014-06-28 ENCOUNTER — Ambulatory Visit (INDEPENDENT_AMBULATORY_CARE_PROVIDER_SITE_OTHER): Payer: Medicaid Other | Admitting: Family Medicine

## 2014-06-28 ENCOUNTER — Encounter: Payer: Self-pay | Admitting: Family Medicine

## 2014-06-28 VITALS — BP 125/88 | HR 105 | Temp 97.9°F | Ht 65.0 in | Wt 176.7 lb

## 2014-06-28 DIAGNOSIS — E1129 Type 2 diabetes mellitus with other diabetic kidney complication: Secondary | ICD-10-CM

## 2014-06-28 DIAGNOSIS — K5909 Other constipation: Secondary | ICD-10-CM

## 2014-06-28 LAB — BASIC METABOLIC PANEL
BUN: 19 mg/dL (ref 6–23)
CALCIUM: 8.8 mg/dL (ref 8.4–10.5)
CO2: 22 meq/L (ref 19–32)
Chloride: 107 mEq/L (ref 96–112)
Creat: 1.45 mg/dL — ABNORMAL HIGH (ref 0.50–1.10)
GLUCOSE: 185 mg/dL — AB (ref 70–99)
Potassium: 4.8 mEq/L (ref 3.5–5.3)
SODIUM: 139 meq/L (ref 135–145)

## 2014-06-28 LAB — POCT GLYCOSYLATED HEMOGLOBIN (HGB A1C): HEMOGLOBIN A1C: 7.2

## 2014-06-28 MED ORDER — CETAPHIL MOISTURIZING EX LOTN: TOPICAL_LOTION | Freq: Every day | CUTANEOUS | Status: DC

## 2014-06-28 NOTE — Assessment & Plan Note (Signed)
A1c today 7.2, even better improvement. I am very happy with this. Continue current regimen.  Cardiac: on statin, aspirin Renal: consider trial again of ACE in future, didn't tolerate before. Check BMET today Eye: still need report from ophtho, will discuss at future visit Foot: due June 2016 for foot exam Immunizations: UTD on immunizations

## 2014-06-28 NOTE — Progress Notes (Signed)
Patient ID: [name], female   DOB: [birth year], 39 y.o.   MRN: [id number]  HPI:  Diabetes: caregiver frustrated that she doesn't walk regularly. Taking januvia 52m daily and glipizide 136mBID. Does not adhere to diabetic diet. Fasting sugars 150's-190's.  Constipation: stooling well with miralax, no issues  ROS: See HPI.  [place] Cityhx MR, schizophrenia, CKD stage 3, chronic constipation, tachycardia, T2DM  PHYSICAL EXAM: BP 125/88 mmHg  Pulse 105  Temp(Src) 97.9 F (36.6 C) (Oral)  Ht 5' 5"  (1.651 m)  Wt 176 lb 11.2 oz (80.151 kg)  BMI 29.40 kg/m2 Gen: NAD HEENT: NCAT Heart: RRR no murmurs Lungs: CTAB NWOB Neuro: at pt's baseline, speech normal, grossly nonfocal, normal gait Ext: No appreciable lower extremity edema bilaterally  Abd: soft NTTP no masses  ASSESSMENT/PLAN:  Type 2 diabetes mellitus with renal manifestations A1c today 7.2, even better improvement. I am very happy with this. Continue current regimen.  Cardiac: on statin, aspirin Renal: consider trial again of ACE in future, didn't tolerate before. Check BMET today Eye: still need report from ophtho, will discuss at future visit Foot: due June 2016 for foot exam Immunizations: UTD on immunizations    CONSTIPATION, CHRONIC Well controlled. Continue current regimen.     FOLLOW UP: F/u in 3 months for diabetes & other chronic medical problems  [place]. McArdelia MemsMDM[place]

## 2014-06-28 NOTE — Patient Instructions (Signed)
It was great to see you again today!  Checking A1c and kidney function today. I will call you if we need to make any changes.  Follow up in 3 months.  Be well, [name]

## 2014-06-28 NOTE — Assessment & Plan Note (Signed)
Well-controlled.  Continue current regimen. 

## 2014-06-30 ENCOUNTER — Encounter: Payer: Self-pay | Admitting: Family Medicine

## 2014-07-03 ENCOUNTER — Ambulatory Visit (INDEPENDENT_AMBULATORY_CARE_PROVIDER_SITE_OTHER): Payer: Medicaid Other | Admitting: *Deleted

## 2014-07-03 VITALS — BP 125/88 | HR 106 | Wt 178.1 lb

## 2014-07-03 DIAGNOSIS — N938 Other specified abnormal uterine and vaginal bleeding: Secondary | ICD-10-CM

## 2014-07-03 MED ORDER — MEDROXYPROGESTERONE ACETATE 104 MG/0.65ML ~~LOC~~ SUSP
104.0000 mg | Freq: Once | SUBCUTANEOUS | Status: AC
  Administered 2014-07-03: 104 mg via SUBCUTANEOUS

## 2014-07-12 ENCOUNTER — Encounter: Payer: Self-pay | Admitting: Family Medicine

## 2014-07-12 NOTE — Progress Notes (Signed)
Caregiver dropped off form to be filled out regarding orders.  Please call her when completed.

## 2014-07-12 NOTE — Progress Notes (Signed)
Placed in md's box. Girard Koontz Kennon Holter, CMA

## 2014-07-17 NOTE — Progress Notes (Signed)
Caregiver for pt informed that form is complete and ready for pick up.  Derl Barrow, RN

## 2014-07-17 NOTE — Progress Notes (Signed)
Form completed, will return to [facility]. Leeanne Rio, MD

## 2014-08-01 ENCOUNTER — Other Ambulatory Visit: Payer: Self-pay | Admitting: Family Medicine

## 2014-08-08 ENCOUNTER — Other Ambulatory Visit: Payer: Self-pay | Admitting: Family Medicine

## 2014-08-11 ENCOUNTER — Other Ambulatory Visit: Payer: Self-pay | Admitting: Family Medicine

## 2014-08-18 ENCOUNTER — Other Ambulatory Visit: Payer: Self-pay | Admitting: Family Medicine

## 2014-09-25 ENCOUNTER — Ambulatory Visit (INDEPENDENT_AMBULATORY_CARE_PROVIDER_SITE_OTHER): Payer: Medicaid Other

## 2014-09-25 VITALS — BP 112/82 | HR 102 | Temp 98.3°F | Wt 171.5 lb

## 2014-09-25 DIAGNOSIS — N939 Abnormal uterine and vaginal bleeding, unspecified: Secondary | ICD-10-CM | POA: Diagnosis not present

## 2014-09-25 MED ORDER — MEDROXYPROGESTERONE ACETATE 104 MG/0.65ML ~~LOC~~ SUSP
104.0000 mg | Freq: Once | SUBCUTANEOUS | Status: AC
  Administered 2014-09-25: 104 mg via SUBCUTANEOUS

## 2014-10-06 ENCOUNTER — Encounter: Payer: Self-pay | Admitting: Family Medicine

## 2014-10-06 ENCOUNTER — Ambulatory Visit (INDEPENDENT_AMBULATORY_CARE_PROVIDER_SITE_OTHER): Payer: Medicaid Other | Admitting: Family Medicine

## 2014-10-06 VITALS — BP 118/78 | HR 100 | Temp 98.1°F | Ht 65.0 in | Wt 172.0 lb

## 2014-10-06 DIAGNOSIS — E1129 Type 2 diabetes mellitus with other diabetic kidney complication: Secondary | ICD-10-CM | POA: Diagnosis present

## 2014-10-06 DIAGNOSIS — M25561 Pain in right knee: Secondary | ICD-10-CM | POA: Diagnosis not present

## 2014-10-06 DIAGNOSIS — M542 Cervicalgia: Secondary | ICD-10-CM

## 2014-10-06 LAB — POCT GLYCOSYLATED HEMOGLOBIN (HGB A1C): Hemoglobin A1C: 7.3

## 2014-10-06 NOTE — Progress Notes (Signed)
Patient ID: Khadijah Mastrianni, female   DOB: September 08, 1975, 39 y.o.   MRN: [id number]  HPI:  F/u DM: continues on januvia and glipizide. No problems with these medicines. A1c today 7.3. Pt feels well. Has lost several pounds. Working on [employer]. Has upcoming eye appt in July.   Neck pain: has stiffness in back of neck from sleeping position. Caregiver has not heard of this complaint until today.  R Knee pain: occasional R knee pain, caregiver has not heard of this complaint until today. Has therefore not taken medicine for this.  ROS: See HPI.  [place]: hx schizophrenia, mental retardation, group home resident, DM  PHYSICAL EXAM: BP 118/78 mmHg  Pulse 100  Temp(Src) 98.1 F (36.7 C) (Oral)  Ht 5' 5"  (1.651 m)  Wt 172 lb (78.019 kg)  BMI 28.62 kg/m2 Gen: NAD HEENT: NCAT. Full ROM of neck. Mild TPP of posterior neck musculature Heart: RRR no murmur Lungs: CTAB NWOB Neuro: grossly nonfocal speech normal Ext: No appreciable lower extremity edema bilaterally. R knee with mild crepitus with extension. No effusion or erythema. No tenderness to palpation. MCL and LCL intact to varus and valgus stressing. Negative lachmans. Diabetic foot exam: 2+ DP pulses bilat, normal monofilament testing bilaterally. No lesions or significant calluses.   ASSESSMENT/PLAN:  Type 2 diabetes mellitus with renal manifestations A1c 7.3 today, at goal. Continue januvia and glipizide. F/u in 3 mos  Cardiac: on statin, aspirin Renal: renal fxn stable when last checked. Consider retrial of low dose lisinopril at next office visit. Eye: upcoming appt in July Foot: normal foot exam today Immunizations: UTD on vaccines     Knee pain - recommend trial of tylenol for relief. May have mild arthritis. F/u if not improving.  Neck pain - likely neck stiffness/spasm from sleeping position. Recommend tylenol and heat. F/u if not improving.  FOLLOW UP: F/u in 3 mos for DM  [name], [place]

## 2014-10-06 NOTE — Assessment & Plan Note (Signed)
A1c 7.3 today, at goal. Continue januvia and glipizide. F/u in 3 mos  Cardiac: on statin, aspirin Renal: renal fxn stable when last checked. Consider retrial of low dose lisinopril at next office visit. Eye: upcoming appt in July Foot: normal foot exam today Immunizations: UTD on vaccines

## 2014-10-06 NOTE — Patient Instructions (Signed)
Great job with A1c and weight loss Continue current medicines Try tylenol for knee and neck Return if not getting better Try warm bottle of water on neck as heating pad Follow up in 3 months  Be well, [name]

## 2014-10-06 NOTE — Progress Notes (Signed)
I was the preceptor on the day of this visit.   Kyle Fletke MD  

## 2014-10-26 ENCOUNTER — Other Ambulatory Visit: Payer: Self-pay | Admitting: Family Medicine

## 2014-10-31 ENCOUNTER — Telehealth: Payer: Self-pay | Admitting: Family Medicine

## 2014-10-31 NOTE — Telephone Encounter (Signed)
Patient's Caregiver dropped two forms to be completed and signed by PCP. Please, follow up with [name]

## 2014-10-31 NOTE — Telephone Encounter (Signed)
Forms placed in PCP box.

## 2014-11-01 NOTE — Telephone Encounter (Signed)
Forms completed and returned to [facility]. Leeanne Rio, MD

## 2014-11-02 NOTE — Telephone Encounter (Signed)
Caregiver of patient informed that forms are ready for pick up.  Forms copied for scanning in patient's record.  Derl Barrow, RN

## 2014-12-14 ENCOUNTER — Other Ambulatory Visit: Payer: Self-pay | Admitting: Family Medicine

## 2014-12-14 ENCOUNTER — Other Ambulatory Visit: Payer: Self-pay | Admitting: Internal Medicine

## 2014-12-15 ENCOUNTER — Ambulatory Visit: Payer: Medicaid Other | Admitting: Family Medicine

## 2014-12-18 ENCOUNTER — Encounter: Payer: Self-pay | Admitting: *Deleted

## 2014-12-18 ENCOUNTER — Ambulatory Visit (INDEPENDENT_AMBULATORY_CARE_PROVIDER_SITE_OTHER): Payer: Medicaid Other | Admitting: *Deleted

## 2014-12-18 VITALS — BP 107/69 | HR 104 | Temp 98.0°F | Wt 169.8 lb

## 2014-12-18 DIAGNOSIS — N938 Other specified abnormal uterine and vaginal bleeding: Secondary | ICD-10-CM | POA: Diagnosis not present

## 2014-12-18 MED ORDER — MEDROXYPROGESTERONE ACETATE 150 MG/ML IM SUSP
150.0000 mg | Freq: Once | INTRAMUSCULAR | Status: AC
  Administered 2014-12-18: 150 mg via INTRAMUSCULAR

## 2014-12-20 ENCOUNTER — Ambulatory Visit (INDEPENDENT_AMBULATORY_CARE_PROVIDER_SITE_OTHER): Payer: Medicaid Other | Admitting: Family Medicine

## 2014-12-20 ENCOUNTER — Encounter: Payer: Self-pay | Admitting: Family Medicine

## 2014-12-20 VITALS — BP 89/44 | HR 109 | Temp 98.4°F | Ht 65.0 in | Wt 169.0 lb

## 2014-12-20 DIAGNOSIS — K5909 Other constipation: Secondary | ICD-10-CM

## 2014-12-20 DIAGNOSIS — E1129 Type 2 diabetes mellitus with other diabetic kidney complication: Secondary | ICD-10-CM

## 2014-12-20 DIAGNOSIS — I959 Hypotension, unspecified: Secondary | ICD-10-CM

## 2014-12-20 DIAGNOSIS — I9589 Other hypotension: Secondary | ICD-10-CM | POA: Insufficient documentation

## 2014-12-20 DIAGNOSIS — L700 Acne vulgaris: Secondary | ICD-10-CM

## 2014-12-20 DIAGNOSIS — N3944 Nocturnal enuresis: Secondary | ICD-10-CM | POA: Insufficient documentation

## 2014-12-20 DIAGNOSIS — E861 Hypovolemia: Secondary | ICD-10-CM | POA: Insufficient documentation

## 2014-12-20 LAB — POCT GLYCOSYLATED HEMOGLOBIN (HGB A1C): Hemoglobin A1C: 7

## 2014-12-20 NOTE — Assessment & Plan Note (Signed)
Acne well controlled. Will try to cut back on acne medications. D/c differin gel. Continue clindamycin topical.

## 2014-12-20 NOTE — Assessment & Plan Note (Addendum)
A1c 7.0. Excellent control. Continue current regimen & f /u in 3 mos for office visit.  Cardiac: on statin, aspirin. Return for fasting CMET & lipids tomorrow as pt not fasting today. Renal: not on ACE due to low BP Eye: UTD Foot: UTD

## 2014-12-20 NOTE — Assessment & Plan Note (Signed)
Well-controlled.  Continue current regimen. 

## 2014-12-20 NOTE — Patient Instructions (Signed)
Stop differin gel.  Ideal sugar range is 80-175 If lower than 80, eat something sugary and call doctor If over 300 at any time call doctor. If over 200 multiple days in a row, call doctor. She does NOT need a nutrition class  On your way out, schedule an appointment one morning to come back for fasting labs. Do not eat or drink anything other than water the morning of your lab appointment until after your labs are drawn.   Come back in 3 months for next diabetes check Return sooner if lightheadedness/dizziness or any other issues.  Be well, [name]

## 2014-12-20 NOTE — Assessment & Plan Note (Signed)
Mentioned in passing by caregiver today. No dysuria or daytime incontinence to suggest UTI. Only occuring 2 times per month. Will just observe for now. If persists, consider UA to workup further. Suspect this is related to decreased cognition at baseline.

## 2014-12-20 NOTE — Progress Notes (Signed)
Patient ID: [name], female   DOB: [birth year], 39 y.o.   MRN: [id number]  HPI:  DM - tolerating current meds (glipizide 82m BID & januvia 573mdaily). Caregiver states regulatory agency wanted to know if pt should take nutrition class.  Hypotension - noted low BP today on exam. Denies CP, SOB, dizziness, lightheadedness, passing out.  Bed wetting - wetting bed 2x/month. No daytime incontinence. Caregiver does NOT want her wearing pullups or diapers to bed as she thinks this will cause more behavioral issues. No dysuria.  Acne - using differin & clindamycin daily. Doing well.   Constipation - bowels moving well. No concerns.  ROS: See HPI.  PMC[place]thx acne, CKD III, chronic constipation  PHYSICAL EXAM: BP 89/44 mmHg  Pulse 109  Temp(Src) 98.4 F (36.9 C) (Oral)  Ht 5' 5"  (1.651 m)  Wt 169 lb (76.658 kg)  BMI 28.12 kg/m2 Gen: NAD, pleasant, cooperative HEENT: NCAT, full ROM of neck Heart: RRR no murmur Lungs: CTAB NWOB Neuro: grossly nonfocal, speech normal, at pt's baseline Ext: No appreciable lower extremity edema bilaterally  Skin: face with freckles, no open or closed comedones seen  ASSESSMENT/PLAN:  CONSTIPATION, CHRONIC Well controlled. Continue current regimen.   Hypotension Noted on exam today, but patient asymptomatic. Initial BP 89/44, 90/60 on recheck. Pt to return tomorrow for lab appointment and repeat nurse BP check. May need to decrease metoprolol if BP remains low.  Acne Acne well controlled. Will try to cut back on acne medications. D/c differin gel. Continue clindamycin topical.  Type 2 diabetes mellitus with renal manifestations A1c 7.0. Excellent control. Continue current regimen & f /u in 3 mos for office visit.  Cardiac: on statin, aspirin. Return for fasting CMET & lipids tomorrow as pt not fasting today. Renal: not on ACE due to low BP Eye: UTD Foot: UTD  Bed wetting Mentioned in passing by caregiver today. No dysuria or daytime  incontinence to suggest UTI. Only occuring 2 times per month. Will just observe for now. If persists, consider UA to workup further. Suspect this is related to decreased cognition at baseline.   FOLLOW UP: F/u in 3 months for above issues Return for fasting labs & repeat BP check tomorrow  [place]. [place]delia Mems[place]

## 2014-12-20 NOTE — Assessment & Plan Note (Signed)
Noted on exam today, but patient asymptomatic. Initial BP 89/44, 90/60 on recheck. Pt to return tomorrow for lab appointment and repeat nurse BP check. May need to decrease metoprolol if BP remains low.

## 2014-12-21 ENCOUNTER — Other Ambulatory Visit: Payer: Medicaid Other

## 2014-12-21 DIAGNOSIS — E1129 Type 2 diabetes mellitus with other diabetic kidney complication: Secondary | ICD-10-CM

## 2014-12-21 LAB — COMPREHENSIVE METABOLIC PANEL
ALT: 10 U/L (ref 6–29)
AST: 12 U/L (ref 10–30)
Albumin: 3.4 g/dL — ABNORMAL LOW (ref 3.6–5.1)
Alkaline Phosphatase: 60 U/L (ref 33–115)
BUN: 23 mg/dL (ref 7–25)
CALCIUM: 9.9 mg/dL (ref 8.6–10.2)
CHLORIDE: 115 mmol/L — AB (ref 98–110)
CO2: 21 mmol/L (ref 20–31)
Creat: 1.72 mg/dL — ABNORMAL HIGH (ref 0.50–1.10)
Glucose, Bld: 189 mg/dL — ABNORMAL HIGH (ref 65–99)
Potassium: 4.4 mmol/L (ref 3.5–5.3)
SODIUM: 147 mmol/L — AB (ref 135–146)
TOTAL PROTEIN: 6.5 g/dL (ref 6.1–8.1)
Total Bilirubin: 0.3 mg/dL (ref 0.2–1.2)

## 2014-12-21 LAB — LIPID PANEL
CHOLESTEROL: 88 mg/dL — AB (ref 125–200)
HDL: 27 mg/dL — ABNORMAL LOW (ref >=46)
LDL Cholesterol: 42 mg/dL (ref <130)
Total CHOL/HDL Ratio: 3.3 Ratio (ref <=5.0)
Triglycerides: 97 mg/dL (ref <150)
VLDL: 19 mg/dL (ref <30)

## 2014-12-21 NOTE — Progress Notes (Signed)
CMP AND FLP DONE TODAY [name]

## 2014-12-22 ENCOUNTER — Ambulatory Visit (INDEPENDENT_AMBULATORY_CARE_PROVIDER_SITE_OTHER): Payer: Medicaid Other | Admitting: *Deleted

## 2014-12-22 ENCOUNTER — Telehealth: Payer: Self-pay | Admitting: Family Medicine

## 2014-12-22 VITALS — BP 100/60 | HR 105

## 2014-12-22 DIAGNOSIS — Z136 Encounter for screening for cardiovascular disorders: Secondary | ICD-10-CM

## 2014-12-22 DIAGNOSIS — N179 Acute kidney failure, unspecified: Secondary | ICD-10-CM

## 2014-12-22 DIAGNOSIS — Z0130 Encounter for examination of blood pressure without abnormal findings: Secondary | ICD-10-CM

## 2014-12-22 NOTE — Telephone Encounter (Signed)
Called pt's caregiver, [name], to discuss lab results. Cholesterol looks good, but renal fxn slightly decreased. Recommend repeat BMET in 1 week. Also encourage drinking plenty of fluids. Lab appt scheduled for 8/18 at 8:30am.  Leeanne Rio, MD

## 2014-12-22 NOTE — Progress Notes (Signed)
   Pt in nurse clinic for blood pressure check.  BP 100/60 manually, heart rate 105. Per [name], blood pressure is ok.  Pt denies any symptoms.  Derl Barrow, RN

## 2014-12-28 ENCOUNTER — Other Ambulatory Visit: Payer: Medicaid Other

## 2014-12-28 DIAGNOSIS — N179 Acute kidney failure, unspecified: Secondary | ICD-10-CM

## 2014-12-28 LAB — BASIC METABOLIC PANEL
BUN: 19 mg/dL (ref 7–25)
CO2: 22 mmol/L (ref 20–31)
CREATININE: 1.42 mg/dL — AB (ref 0.50–1.10)
Calcium: 8.6 mg/dL (ref 8.6–10.2)
Chloride: 109 mmol/L (ref 98–110)
Glucose, Bld: 289 mg/dL — ABNORMAL HIGH (ref 65–99)
Potassium: 4.5 mmol/L (ref 3.5–5.3)
Sodium: 141 mmol/L (ref 135–146)

## 2014-12-28 NOTE — Progress Notes (Signed)
BMP DONE TODAY [name] 

## 2015-01-01 ENCOUNTER — Encounter: Payer: Self-pay | Admitting: Family Medicine

## 2015-01-09 ENCOUNTER — Telehealth: Payer: Self-pay | Admitting: *Deleted

## 2015-01-09 ENCOUNTER — Encounter (HOSPITAL_COMMUNITY): Payer: Self-pay | Admitting: Family Medicine

## 2015-01-09 ENCOUNTER — Emergency Department (HOSPITAL_COMMUNITY)
Admission: EM | Admit: 2015-01-09 | Discharge: 2015-01-09 | Disposition: A | Discharge status: Home / Self Care | Payer: Medicaid Other | Attending: Emergency Medicine | Admitting: Emergency Medicine

## 2015-01-09 ENCOUNTER — Emergency Department (HOSPITAL_COMMUNITY): Payer: Medicaid Other

## 2015-01-09 DIAGNOSIS — Z79899 Other long term (current) drug therapy: Secondary | ICD-10-CM | POA: Insufficient documentation

## 2015-01-09 DIAGNOSIS — Z8719 Personal history of other diseases of the digestive system: Secondary | ICD-10-CM | POA: Diagnosis not present

## 2015-01-09 DIAGNOSIS — E669 Obesity, unspecified: Secondary | ICD-10-CM | POA: Insufficient documentation

## 2015-01-09 DIAGNOSIS — F39 Unspecified mood [affective] disorder: Secondary | ICD-10-CM | POA: Insufficient documentation

## 2015-01-09 DIAGNOSIS — Z8679 Personal history of other diseases of the circulatory system: Secondary | ICD-10-CM | POA: Diagnosis not present

## 2015-01-09 DIAGNOSIS — Z3202 Encounter for pregnancy test, result negative: Secondary | ICD-10-CM | POA: Insufficient documentation

## 2015-01-09 DIAGNOSIS — Z008 Encounter for other general examination: Secondary | ICD-10-CM | POA: Diagnosis present

## 2015-01-09 DIAGNOSIS — E663 Overweight: Secondary | ICD-10-CM | POA: Diagnosis not present

## 2015-01-09 DIAGNOSIS — Z862 Personal history of diseases of the blood and blood-forming organs and certain disorders involving the immune mechanism: Secondary | ICD-10-CM | POA: Insufficient documentation

## 2015-01-09 DIAGNOSIS — F79 Unspecified intellectual disabilities: Secondary | ICD-10-CM | POA: Insufficient documentation

## 2015-01-09 DIAGNOSIS — E119 Type 2 diabetes mellitus without complications: Secondary | ICD-10-CM | POA: Diagnosis not present

## 2015-01-09 DIAGNOSIS — F419 Anxiety disorder, unspecified: Secondary | ICD-10-CM | POA: Insufficient documentation

## 2015-01-09 DIAGNOSIS — Z872 Personal history of diseases of the skin and subcutaneous tissue: Secondary | ICD-10-CM | POA: Insufficient documentation

## 2015-01-09 HISTORY — DX: Mild intellectual disabilities: F70

## 2015-01-09 HISTORY — DX: Acne, unspecified: L70.9

## 2015-01-09 HISTORY — DX: Type 2 diabetes mellitus without complications: E11.9

## 2015-01-09 LAB — CBG MONITORING, ED: GLUCOSE-CAPILLARY: 193 mg/dL — AB (ref 65–99)

## 2015-01-09 LAB — COMPREHENSIVE METABOLIC PANEL
ALT: 14 U/L (ref 14–54)
AST: 19 U/L (ref 15–41)
Albumin: 3.4 g/dL — ABNORMAL LOW (ref 3.5–5.0)
Alkaline Phosphatase: 63 U/L (ref 38–126)
Anion gap: 8 (ref 5–15)
BILIRUBIN TOTAL: 0.4 mg/dL (ref 0.3–1.2)
BUN: 24 mg/dL — AB (ref 6–20)
CO2: 21 mmol/L — ABNORMAL LOW (ref 22–32)
CREATININE: 1.87 mg/dL — AB (ref 0.44–1.00)
Calcium: 8.8 mg/dL — ABNORMAL LOW (ref 8.9–10.3)
Chloride: 108 mmol/L (ref 101–111)
GFR, EST AFRICAN AMERICAN: 38 mL/min — AB (ref >60)
GFR, EST NON AFRICAN AMERICAN: 32 mL/min — AB (ref >60)
Glucose, Bld: 348 mg/dL — ABNORMAL HIGH (ref 65–99)
POTASSIUM: 4.1 mmol/L (ref 3.5–5.1)
Sodium: 137 mmol/L (ref 135–145)
TOTAL PROTEIN: 6.6 g/dL (ref 6.5–8.1)

## 2015-01-09 LAB — CBC WITH DIFFERENTIAL/PLATELET
BASOS ABS: 0 10*3/uL (ref 0.0–0.1)
Basophils Relative: 0 % (ref 0–1)
EOS ABS: 0.2 10*3/uL (ref 0.0–0.7)
EOS PCT: 3 % (ref 0–5)
HCT: 35.8 % — ABNORMAL LOW (ref 36.0–46.0)
Hemoglobin: 11.7 g/dL — ABNORMAL LOW (ref 12.0–15.0)
Lymphocytes Relative: 31 % (ref 12–46)
Lymphs Abs: 2.2 10*3/uL (ref 0.7–4.0)
MCH: 29.5 pg (ref 26.0–34.0)
MCHC: 32.7 g/dL (ref 30.0–36.0)
MCV: 90.2 fL (ref 78.0–100.0)
Monocytes Absolute: 0.5 10*3/uL (ref 0.1–1.0)
Monocytes Relative: 7 % (ref 3–12)
NEUTROS PCT: 59 % (ref 43–77)
Neutro Abs: 4.2 10*3/uL (ref 1.7–7.7)
PLATELETS: 151 10*3/uL (ref 150–400)
RBC: 3.97 MIL/uL (ref 3.87–5.11)
RDW: 13.5 % (ref 11.5–15.5)
WBC: 7.2 10*3/uL (ref 4.0–10.5)

## 2015-01-09 LAB — URINALYSIS, ROUTINE W REFLEX MICROSCOPIC
Bilirubin Urine: NEGATIVE
Glucose, UA: 250 mg/dL — AB
Hgb urine dipstick: NEGATIVE
KETONES UR: NEGATIVE mg/dL
NITRITE: NEGATIVE
PROTEIN: NEGATIVE mg/dL
Specific Gravity, Urine: 1.008 (ref 1.005–1.030)
UROBILINOGEN UA: 0.2 mg/dL (ref 0.0–1.0)
pH: 6 (ref 5.0–8.0)

## 2015-01-09 LAB — POC URINE PREG, ED: PREG TEST UR: NEGATIVE

## 2015-01-09 LAB — RAPID URINE DRUG SCREEN, HOSP PERFORMED
Amphetamines: NOT DETECTED
BARBITURATES: NOT DETECTED
Benzodiazepines: NOT DETECTED
COCAINE: NOT DETECTED
OPIATES: NOT DETECTED
Tetrahydrocannabinol: NOT DETECTED

## 2015-01-09 LAB — URINE MICROSCOPIC-ADD ON

## 2015-01-09 MED ORDER — SODIUM CHLORIDE 0.9 % IV BOLUS (SEPSIS)
1000.0000 mL | Freq: Once | INTRAVENOUS | Status: AC
  Administered 2015-01-09: 1000 mL via INTRAVENOUS

## 2015-01-09 MED ORDER — LORAZEPAM 0.5 MG PO TABS: 0.5000 mg | ORAL_TABLET | Freq: Three times a day (TID) | ORAL | Status: DC | PRN

## 2015-01-09 MED ORDER — LORAZEPAM 1 MG PO TABS
1.0000 mg | ORAL_TABLET | Freq: Once | ORAL | Status: AC
  Administered 2015-01-09: 1 mg via ORAL
  Filled 2015-01-09: qty 1

## 2015-01-09 NOTE — ED Notes (Signed)
Patient was sitting at [name], eating with her caregiver and friends. All of sudden, patient started yelling and beating her head with the palm of her hand. Caregiver reports she has been with her for 6 years and never had this type of outbreak. Pt denies pain and denies anything that is bothering her or cause of actions.

## 2015-01-09 NOTE — ED Notes (Signed)
Patient transported to CT 

## 2015-01-09 NOTE — ED Notes (Signed)
Patient came into facility on stretcher, yelling, intermittent hitting her head with the palms of her hands. Intermittently, she would calm down and talk with staff. At times, all of a sudden she would started screaming and performing the same behaviors. She does complain of neck pain with no recent injuries. Caregiver, Lorita Officer, reports patient has a history of outburst but not had one with her in the 6 years she has been working with her.

## 2015-01-09 NOTE — Telephone Encounter (Signed)
Prior Authorization received from [employer] for [employer]. Formulary and PA form placed in provider box for completion. Derl Barrow, RN

## 2015-01-09 NOTE — ED Notes (Signed)
Bed: FP82 Expected date:  Expected time:  Means of arrival:  Comments: EMS 39 yo female-sudden outburst with care givers

## 2015-01-09 NOTE — Discharge Instructions (Signed)
[name]'s Creatinine (kidney function) was slightly elevated today.  Please have this rechecked by her family doctor in the next week.   Schizophrenia Schizophrenia is a mental illness. It may cause disturbed or disorganized thinking, speech, or behavior. People with schizophrenia have problems functioning in one or more areas of life: work, school, home, or relationships. People with schizophrenia are at increased risk for suicide, certain chronic physical illnesses, and unhealthy behaviors, such as smoking and drug use. People who have family members with schizophrenia are at higher risk of developing the illness. Schizophrenia affects men and women equally but usually appears at an earlier age (teenage or early adult years) in men.  SYMPTOMS The earliest symptoms are often subtle (prodrome) and may go unnoticed until the illness becomes more severe (first-break psychosis). Symptoms of schizophrenia may be continuous or may come and go in severity. Episodes often are triggered by major life events, such as family stress, college, [employer], marriage, pregnancy or child birth, divorce, or loss of a loved one. People with schizophrenia may see, hear, or feel things that do not exist (hallucinations). They may have false beliefs in spite of obvious proof to the contrary (delusions). Sometimes speech is incoherent or behavior is odd or withdrawn.  DIAGNOSIS Schizophrenia is diagnosed through an assessment by your caregiver. Your caregiver will ask questions about your thoughts, behavior, mood, and ability to function in daily life. Your caregiver may ask questions about your medical history and use of alcohol or drugs, including prescription medication. Your caregiver may also order blood tests and imaging exams. Certain medical conditions and substances can cause symptoms that resemble schizophrenia. Your caregiver may refer you to a mental health specialist for evaluation. There are three major  criterion for a diagnosis of schizophrenia:  Two or more of the following five symptoms are present for a month or longer:  Delusions. Often the delusions are that you are being attacked, harassed, cheated, persecuted or conspired against (persecutory delusions).  Hallucinations.   Disorganized speech that does not make sense to others.  Grossly disorganized (confused or unfocused) behavior or extremely overactive or underactive motor activity (catatonia).  Negative symptoms such as bland or blunted emotions (flat affect), loss of will power (avolition), and withdrawal from social contacts (social isolation).  Level of functioning in one or more major areas of life (work, school, relationships, or self-care) is markedly below the level of functioning before the onset of illness.   There are continuous signs of illness (either mild symptoms or decreased level of functioning) for at least 6 months or longer. TREATMENT  Schizophrenia is a long-term illness. It is best controlled with continuous treatment rather than treatment only when symptoms occur. The following treatments are used to manage schizophrenia:  Medication--Medication is the most effective and important form of treatment for schizophrenia. Antipsychotic medications are usually prescribed to help manage schizophrenia. Other types of medication may be added to relieve any symptoms that may occur despite the use of antipsychotic medications.  Counseling or talk therapy--Individual, group, or family counseling may be helpful in providing education, support, and guidance. Many people with schizophrenia also benefit from social skills and job skills (vocational) training. A combination of medication and counseling is best for managing the disorder over time. A procedure in which electricity is applied to the brain through the scalp (electroconvulsive therapy) may be used to treat catatonic schizophrenia or schizophrenia in people who  cannot take or do not respond to medication and counseling. Document Released: 04/25/2000 Document  Revised: 12/29/2012 Document Reviewed: 07/21/2012 [facility] Patient Information 2015 [place], [place]. This information is not intended to replace advice given to you by your health care provider. Make sure you discuss any questions you have with your health care provider.

## 2015-01-09 NOTE — ED Provider Notes (Signed)
CSN: [id number]     Arrival date & time 01/09/15  2036 History   First MD Initiated Contact with Patient 01/09/15 2041     Chief Complaint  Patient presents with  . Psychiatric Evaluation    The history is provided by the patient and a caregiver. No language interpreter was used.   [name] presents for psychiatric evaluation. Level V caveat due to psychiatric disease. History is provided by the patient's caregiver. The patient was in her routine state of health when she was at a party this evening. At the party she would start screaming and yelling and beating her head with the palm of her hand. There is no reports of injuries or recent illness. She does not have a history of similar prior behaviors. She has diabetes, schizophrenia, mild developmental delay. There is recent changes in her medications. On evaluation in the emergency department patient complains of some posterior neck pain on the left side as well as seeing colors. She denies any SI, anxiety.  Past Medical History  Diagnosis Date  . Psychic disease   . Schizoaffective disorder   . Patient overweight   . Anemia   . Internal hemorrhoid, bleeding 10/22/2010  . SVT (supraventricular tachycardia)     with short PR interval - followed by Dr. Lovena Le  . Obesity   . Diabetes mellitus without complication   . Acne   . Mild mental retardation    Past Surgical History  Procedure Laterality Date  . Breast reduction surgery  08/10/2005  . Hysteroscopy  09/09/2004    w/ removal of polyps   Family History  Problem Relation Age of Onset  . Schizophrenia Sister    Social History  Substance Use Topics  . Smoking status: Never Smoker   . Smokeless tobacco: Never Used  . Alcohol Use: No   OB History    No data available     Review of Systems  All other systems reviewed and are negative.     Allergies  Minocycline  Home Medications   Prior to Admission medications   Medication Sig Start Date End Date Taking?  Authorizing Provider  ACCU-CHEK FASTCLIX LANCETS MISC Check blood sugar each morning 02/22/14  Yes Leeanne Rio, MD  acetaminophen (TYLENOL) 500 MG tablet Take 1 tablet (500 mg total) by mouth every 8 (eight) hours as needed for mild pain or fever. 12/28/13  Yes Leeanne Rio, MD  atorvastatin (LIPITOR) 20 MG tablet TAKE ONE TABLET EACH DAY Patient taking differently: TAKE 20 MG BY MOUTH DAILY 08/01/14  Yes Leeanne Rio, MD  benztropine (COGENTIN) 1 MG tablet Take 1 mg by mouth 2 (two) times daily.     Yes Historical Provider, MD  Blood Glucose Monitoring Suppl (ACCU-CHEK AVIVA PLUS) W/DEVICE KIT CHECK BLOOD GLUCOSE (SUGAR) EVERY MORNING 08/14/14  Yes Leeanne Rio, MD  cetaphil (CETAPHIL) lotion Apply topically daily. Patient taking differently: Apply 1 application topically daily.  06/28/14  Yes Leeanne Rio, MD  clindamycin (CLINDAGEL) 1 % gel APPLY DAILY Patient taking differently: APPLY 1 APPLICATION TOPICALLY DAILY 10/26/14  Yes Leeanne Rio, MD  clozapine (FAZACLO) 100 MG disintegrating tablet Take 400 mg by mouth at bedtime.    Yes Historical Provider, MD  divalproex (DEPAKOTE) 500 MG 24 hr tablet Take 500 mg by mouth 2 (two) times daily at 10 AM and 5 PM.    Yes Historical Provider, MD  docusate sodium (DOK) 100 MG capsule TAKE ONE CAPSULE EACH DAY Patient taking  differently: Take 100 mg by mouth daily.  05/11/13  Yes Leeanne Rio, MD  famotidine (PEPCID) 20 MG tablet TAKE ONE TABLET TWICE DAILY Patient taking differently: TAKE 20 MG BY MOUTH TWICE DAILY 03/10/14  Yes Leeanne Rio, MD  glipiZIDE (GLUCOTROL) 10 MG tablet TAKE ONE TABLET TWICE DAILY BEFORE A MEAL Patient taking differently: TAKE 10 MG BY MOUTH TWICE DAILY BEFORE A MEAL 08/18/14  Yes Leeanne Rio, MD  glucose blood (ACCU-CHEK AVIVA PLUS) test strip Check blood sugar each morning. 02/22/14  Yes Leeanne Rio, MD  JANUVIA 50 MG tablet TAKE ONE TABLET BY MOUTH ONCE  DAILY Patient taking differently: TAKE 50 MG BY MOUTH ONCE DAILY 05/09/14  Yes Leeanne Rio, MD  medroxyPROGESTERone (DEPO-PROVERA) 150 MG/ML injection Inject 1 mL (150 mg total) into the muscle every 3 (three) months. 01/21/13 01/09/15 Yes Leeanne Rio, MD  metoprolol succinate (TOPROL-XL) 25 MG 24 hr tablet TAKE ONE TABLET EACH DAY Patient taking differently: TAKE 25 MG BY MOUTH DAILY 12/14/14  Yes Evans Lance, MD  PARoxetine (PAXIL) 30 MG tablet Take 60 mg by mouth at bedtime.    Yes Historical Provider, MD  polyethylene glycol powder (GLYCOLAX/MIRALAX) powder TAKE 17 GRAMS BY MOUTH DAILY AT BEDTIME-CAN INCREASE TO TWICE DAILY IF ANY CONSTIPATION 08/08/14  Yes Leeanne Rio, MD  QC CHILDRENS ASPIRIN 81 MG chewable tablet CHEW ONE TABLET DAILY Patient taking differently: CHEW 81 MG BY MOUTH DAILY 12/15/14  Yes Leeanne Rio, MD  Simethicone (GAS-X PO) Take 1 tablet by mouth daily as needed (gas).   Yes Historical Provider, MD   BP 110/71 mmHg  Pulse 97  Temp(Src) 98.5 F (36.9 C) (Oral)  Resp 20  Ht 5' (1.524 m)  Wt 169 lb (76.658 kg)  BMI 33.01 kg/m2  SpO2 99% Physical Exam  Constitutional: She is oriented to person, place, and time. She appears well-developed and well-nourished.  HENT:  Head: Normocephalic and atraumatic.  Eyes: Pupils are equal, round, and reactive to light.  Cardiovascular: Normal rate and regular rhythm.   No murmur heard. Pulmonary/Chest: Effort normal and breath sounds normal. No respiratory distress.  Abdominal: Soft. There is no tenderness. There is no rebound and no guarding.  Musculoskeletal: She exhibits no edema or tenderness.  Neurological: She is alert and oriented to person, place, and time.  5 out of 5 strength in all 4 extremities  Skin: Skin is warm and dry.  Psychiatric:  Appears anxious, intermittently will scream  Nursing note and vitals reviewed.   ED Course  Procedures (including critical care time) Labs  Review Labs Reviewed  COMPREHENSIVE METABOLIC PANEL - Abnormal; Notable for the following:    CO2 21 (*)    Glucose, Bld 348 (*)    BUN 24 (*)    Creatinine, Ser 1.87 (*)    Calcium 8.8 (*)    Albumin 3.4 (*)    GFR calc non Af Amer 32 (*)    GFR calc Af Amer 38 (*)    All other components within normal limits  CBC WITH DIFFERENTIAL/PLATELET - Abnormal; Notable for the following:    Hemoglobin 11.7 (*)    HCT 35.8 (*)    All other components within normal limits  URINALYSIS, ROUTINE W REFLEX MICROSCOPIC (NOT AT [facility]) - Abnormal; Notable for the following:    APPearance CLOUDY (*)    Glucose, UA 250 (*)    Leukocytes, UA SMALL (*)    All other components within normal  limits  URINE RAPID DRUG SCREEN, HOSP PERFORMED  URINE MICROSCOPIC-ADD ON  POC URINE PREG, ED    Imaging Review Ct Head Wo Contrast  01/09/2015   CLINICAL DATA:  Altered mental status  EXAM: CT HEAD WITHOUT CONTRAST  TECHNIQUE: Contiguous axial images were obtained from the base of the skull through the vertex without intravenous contrast.  COMPARISON:  None.  FINDINGS: There is no intracranial hemorrhage, mass or evidence of acute infarction. There is moderate generalized atrophy. Gray matter and white matter exhibit normal differentiation. No focal brain abnormality is evident. There is no significant extra-axial fluid collection.  No acute intracranial findings are evident. There is an incompletely imaged retention cyst in the floor the right maxillary sinus. Remainder of the visible paranasal sinuses are clear.  IMPRESSION: Moderate generalized atrophy.  No acute findings.   Electronically Signed   By: Andreas Newport M.D.   On: 01/09/2015 22:48   I have personally reviewed and evaluated these images and lab results as part of my medical decision-making.   EKG Interpretation None      MDM   Final diagnoses:  Mood disorder    Patient with history of schizophrenia and mild developmental delay here with  screaming spells. Patient cannot describe any inciting events. Caregiver states that she was at a party and she does get upset over food. She did want more to eat but she had a party in the same amount as everybody else and she was not allowed to eat anything additional. It is unclear if this triggered the event. After Ativan in the emergency department patient is calm and cooperative. The patient did complain of some neck pain but has no meningismus, no history of injuries. Neck pain is consistent with musculoskeletal pain. Labs demonstrates stable renal insufficiency with hyperglycemia. Hyperglycemia did improve after IV fluid administration. Plan to DC home with PCP follow-up for recheck of her renal insufficiency and psychiatry follow-up given her outburst. Providing prescription for when necessary Ativan for the next few days if she has additional episodes with close return precautions.    Quintella Reichert, MD 01/10/15 0100

## 2015-01-10 NOTE — Telephone Encounter (Signed)
Prior auth completed. Will return to [employer]. Leeanne Rio, MD

## 2015-01-10 NOTE — Telephone Encounter (Signed)
Received PA approval for Januvia 50 mg via [employer].  Med approved for 01/10/15 - 01/10/16.  [facility] informed.  PA approval number [id number]. Derl Barrow, RN

## 2015-01-16 ENCOUNTER — Ambulatory Visit (INDEPENDENT_AMBULATORY_CARE_PROVIDER_SITE_OTHER): Payer: Medicaid Other | Admitting: Family Medicine

## 2015-01-16 ENCOUNTER — Encounter: Payer: Self-pay | Admitting: Family Medicine

## 2015-01-16 VITALS — BP 119/69 | HR 103 | Temp 98.2°F | Ht 65.0 in | Wt 169.3 lb

## 2015-01-16 DIAGNOSIS — F69 Unspecified disorder of adult personality and behavior: Secondary | ICD-10-CM | POA: Diagnosis not present

## 2015-01-16 DIAGNOSIS — M542 Cervicalgia: Secondary | ICD-10-CM | POA: Diagnosis not present

## 2015-01-16 DIAGNOSIS — IMO0002 Reserved for concepts with insufficient information to code with codable children: Secondary | ICD-10-CM | POA: Insufficient documentation

## 2015-01-16 DIAGNOSIS — N179 Acute kidney failure, unspecified: Secondary | ICD-10-CM | POA: Diagnosis present

## 2015-01-16 DIAGNOSIS — R4689 Other symptoms and signs involving appearance and behavior: Secondary | ICD-10-CM | POA: Insufficient documentation

## 2015-01-16 LAB — BASIC METABOLIC PANEL
BUN: 18 mg/dL (ref 7–25)
CALCIUM: 8.9 mg/dL (ref 8.6–10.2)
CO2: 23 mmol/L (ref 20–31)
CREATININE: 1.58 mg/dL — AB (ref 0.50–1.10)
Chloride: 110 mmol/L (ref 98–110)
Glucose, Bld: 343 mg/dL — ABNORMAL HIGH (ref 65–99)
Potassium: 4.9 mmol/L (ref 3.5–5.3)
Sodium: 141 mmol/L (ref 135–146)

## 2015-01-16 NOTE — Progress Notes (Signed)
Patient ID: [name], female   DOB: 1975/09/23, 39 y.o.   MRN: [id number]  HPI:  Pt presents to f/u after ED visit. Was seen in ED on 8/30 after having sudden outburst at restaurant, which is unusual for her. Had labs drawn which showed hyperglycemia and mild AKI. Given ativan in ED, with improvement in symptoms. Has upcoming psychiatry appt on 9/20, and caregiver has been in touch with psychiatrist since this event. Has not required any additional doses of ativan, no further outbursts. Eating and drinking well, with normal bowel/bladder habits. Endorses mild neck pain, but per caregiver pt never mentions this at home and has never asked for medication to help with the pain.  ROS: See HPI.  [place]: hx CKD, constipation, schizophrenia, mental retardation, DM  PHYSICAL EXAM: BP 119/69 mmHg  Pulse 103  Temp(Src) 98.2 F (36.8 C) (Oral)  Ht 5' 5"  (1.651 m)  Wt 169 lb 4.8 oz (76.794 kg)  BMI 28.17 kg/m2 Gen: NAD, pleasant, cooperative, smiling, talkative HEENT: NCAT. Full ROM of neck. No tenderness with palpation Resp: NWOB, speaks in full sentences without distress Neuro: grossly nonfocal, at pt's baseline. Gait normal Abd; soft NTTP Ext: atraumatic. Full strength bilat upper extremities.  ASSESSMENT/PLAN:  1. Outburst - no further episodes. Continue psych meds, f/u with psychiatry  2. AKI - noted on labs in ED. Recheck BMET today  3. Mild neck pain - no meningeal signs. Encouraged pt to ask for PRN pain tylenol when she has this at home.   FOLLOW UP: F/u in 2 months for chronic medical problems.  [place]. Ardelia Mems, [place]

## 2015-01-16 NOTE — Patient Instructions (Signed)
Checking kidney function today Will call or send letter with results  Be well, [name]

## 2015-01-17 ENCOUNTER — Encounter: Payer: Self-pay | Admitting: Family Medicine

## 2015-01-26 ENCOUNTER — Encounter: Payer: Self-pay | Admitting: Internal Medicine

## 2015-01-26 ENCOUNTER — Ambulatory Visit (INDEPENDENT_AMBULATORY_CARE_PROVIDER_SITE_OTHER): Payer: Medicaid Other | Admitting: Internal Medicine

## 2015-01-26 VITALS — BP 90/60 | HR 98 | Ht 65.0 in | Wt 170.2 lb

## 2015-01-26 DIAGNOSIS — I471 Supraventricular tachycardia, unspecified: Secondary | ICD-10-CM | POA: Insufficient documentation

## 2015-01-26 NOTE — Progress Notes (Signed)
HPI Ms. Charlot returns today for followup of SVT. She is a very pleasant 39 year old woman with a history of tachycardia palpitations and SVT. The patient has not had any recurrent arrhythmias in the past year. She is not a particularly good historian, as she has schizoaffective disorder. She is heavily medicated. She has not been hospitalized although she has been to the ED with behavioroal problems. She lays around during the day and is very inactive. She has gained weight.  Allergies  Allergen Reactions  . Minocycline     unknown     Current Outpatient Prescriptions  Medication Sig Dispense Refill  . ACCU-CHEK FASTCLIX LANCETS MISC Check blood sugar each morning 102 each 12  . acetaminophen (TYLENOL) 500 MG tablet Take 1 tablet (500 mg total) by mouth every 8 (eight) hours as needed for mild pain or fever. 30 tablet 0  . atorvastatin (LIPITOR) 20 MG tablet TAKE ONE TABLET EACH DAY (Patient taking differently: TAKE 20 MG BY MOUTH DAILY) 90 tablet 3  . benztropine (COGENTIN) 1 MG tablet Take 1 mg by mouth 2 (two) times daily.      . Blood Glucose Monitoring Suppl (ACCU-CHEK AVIVA PLUS) W/DEVICE KIT CHECK BLOOD GLUCOSE (SUGAR) EVERY MORNING 1 kit 0  . cetaphil (CETAPHIL) lotion Apply topically daily. (Patient taking differently: Apply 1 application topically daily. ) 236 mL 3  . clindamycin (CLINDAGEL) 1 % gel APPLY DAILY (Patient taking differently: APPLY 1 APPLICATION TOPICALLY DAILY) 30 g 2  . clozapine (FAZACLO) 100 MG disintegrating tablet Take 400 mg by mouth at bedtime.     . divalproex (DEPAKOTE) 500 MG 24 hr tablet Take 500 mg by mouth 2 (two) times daily at 10 AM and 5 PM.     . docusate sodium (DOK) 100 MG capsule TAKE ONE CAPSULE EACH DAY (Patient taking differently: Take 100 mg by mouth daily. ) 30 capsule 5  . famotidine (PEPCID) 20 MG tablet TAKE ONE TABLET TWICE DAILY (Patient taking differently: TAKE 20 MG BY MOUTH TWICE DAILY) 60 tablet 11  . glipiZIDE (GLUCOTROL)  10 MG tablet TAKE ONE TABLET TWICE DAILY BEFORE A MEAL (Patient taking differently: TAKE 10 MG BY MOUTH TWICE DAILY BEFORE A MEAL) 180 tablet 3  . glucose blood (ACCU-CHEK AVIVA PLUS) test strip Check blood sugar each morning. 100 each 12  . JANUVIA 50 MG tablet TAKE ONE TABLET BY MOUTH ONCE DAILY (Patient taking differently: TAKE 50 MG BY MOUTH ONCE DAILY) 30 tablet 11  . LORazepam (ATIVAN) 0.5 MG tablet Take 1 tablet (0.5 mg total) by mouth every 8 (eight) hours as needed for anxiety. 9 tablet 0  . metoprolol succinate (TOPROL-XL) 25 MG 24 hr tablet TAKE ONE TABLET EACH DAY (Patient taking differently: TAKE 25 MG BY MOUTH DAILY) 90 tablet 0  . PARoxetine (PAXIL) 30 MG tablet Take 60 mg by mouth at bedtime.     . polyethylene glycol powder (GLYCOLAX/MIRALAX) powder TAKE 17 GRAMS BY MOUTH DAILY AT BEDTIME-CAN INCREASE TO TWICE DAILY IF ANY CONSTIPATION 527 g 5  . QC CHILDRENS ASPIRIN 81 MG chewable tablet CHEW ONE TABLET DAILY (Patient taking differently: CHEW 81 MG BY MOUTH DAILY) 30 tablet 11  . Simethicone (GAS-X PO) Take 1 tablet by mouth daily as needed (gas).    . medroxyPROGESTERone (DEPO-PROVERA) 150 MG/ML injection Inject 1 mL (150 mg total) into the muscle every 3 (three) months.     No current facility-administered medications for this visit.     Past  Medical History  Diagnosis Date  . Psychic disease   . Schizoaffective disorder   . Patient overweight   . Anemia   . Internal hemorrhoid, bleeding 10/22/2010  . SVT (supraventricular tachycardia)     with short PR interval - followed by Dr. Lovena Le  . Obesity   . Diabetes mellitus without complication   . Acne   . Mild mental retardation     ROS:   All systems reviewed and negative except as noted in the HPI.   Past Surgical History  Procedure Laterality Date  . Breast reduction surgery  08/10/2005  . Hysteroscopy  09/09/2004    w/ removal of polyps     Family History  Problem Relation Age of Onset  . Schizophrenia  Sister      Social History   Social History  . Marital Status: Single    Spouse Name: N/A  . Number of Children: N/A  . Years of Education: N/A   Occupational History  . Not on file.   Social History Main Topics  . Smoking status: Never Smoker   . Smokeless tobacco: Never Used  . Alcohol Use: No  . Drug Use: No  . Sexual Activity: No   Other Topics Concern  . Not on file   Social History Narrative   Caregiver is Morrell Riddle. [place] [facility] Cell [phone]/[id number] lived in group home. Twin sister with similar problem list. Has 24H care.      Case manager is Justin Mend, [phone].      Caregiver works for [employer], [address] [place], [place] [address]. Abbey Chatters is clinical supervisor, [phone].      Sammuel Bailiff is [employer] [place] [employer] [place] [employer] [place] [phone] office. Cell [phone].     BP 90/60 mmHg  Pulse 98  Ht _0  (1.651 m)  Wt 170 lb 3.2 oz (77.202 kg)  BMI 28.32 kg/m2  SpO2 97%  Physical Exam:  Stable appearing 39 year old woman, NAD HEENT: Unremarkable Neck:  6 cm JVD, no thyromegally Back:  No CVA tenderness Lungs:  Clear with no wheezes, rales, or rhonchi. HEART:  Regular rate rhythm, no murmurs, no rubs, no clicks Abd:  soft, positive bowel sounds, no organomegally, no rebound, no guarding Ext:  2 plus pulses, no edema, no cyanosis, no clubbing Skin:  No rashes no nodules Neuro:  CN II through XII intact, motor grossly intact    Assess/Plan:

## 2015-01-26 NOTE — Assessment & Plan Note (Signed)
Her symptoms are well controlled. She will continue her beta blocker. I will see her back as needed. Will follow.

## 2015-01-26 NOTE — Patient Instructions (Addendum)
Medication Instructions:  Your physician recommends that you continue on your current medications as directed. Please refer to the Current Medication list given to you today.   Labwork: None ordered  Testing/Procedures: None ordered  Follow-Up: No follow up is needed at this time with Dr. Lovena Le.  He will see you on an as needed basis.   Any Other Special Instructions Will Be Listed Below (If Applicable). Thank you for choosing [name]!!

## 2015-02-23 ENCOUNTER — Other Ambulatory Visit: Payer: Self-pay | Admitting: *Deleted

## 2015-02-23 MED ORDER — GLUCOSE BLOOD VI STRP: ORAL_STRIP | Status: DC

## 2015-03-05 ENCOUNTER — Ambulatory Visit: Payer: Medicaid Other

## 2015-03-07 ENCOUNTER — Ambulatory Visit (INDEPENDENT_AMBULATORY_CARE_PROVIDER_SITE_OTHER): Payer: Medicaid Other | Admitting: *Deleted

## 2015-03-07 VITALS — Wt 168.4 lb

## 2015-03-07 DIAGNOSIS — Z3042 Encounter for surveillance of injectable contraceptive: Secondary | ICD-10-CM | POA: Diagnosis present

## 2015-03-07 MED ORDER — MEDROXYPROGESTERONE ACETATE 150 MG/ML IM SUSP
150.0000 mg | Freq: Once | INTRAMUSCULAR | Status: AC
  Administered 2015-03-07: 150 mg via INTRAMUSCULAR

## 2015-03-07 NOTE — Progress Notes (Addendum)
Pt had last Gyn exam w/Pap on 06/03/13. Next Pap/annual exam due in 2020 per Dr. Ihor Dow. Depo Provera 150 mg given - next injection due 05/23/15-06/06/15.  Pt expressed no complaints. Caregiver present for encounter.

## 2015-03-09 ENCOUNTER — Other Ambulatory Visit: Payer: Self-pay | Admitting: *Deleted

## 2015-03-09 ENCOUNTER — Other Ambulatory Visit: Payer: Self-pay | Admitting: Internal Medicine

## 2015-03-09 MED ORDER — FAMOTIDINE 20 MG PO TABS: ORAL_TABLET | ORAL | Status: DC

## 2015-03-16 ENCOUNTER — Encounter: Payer: Self-pay | Admitting: *Deleted

## 2015-04-10 ENCOUNTER — Other Ambulatory Visit: Payer: Self-pay | Admitting: Family Medicine

## 2015-04-13 ENCOUNTER — Ambulatory Visit (INDEPENDENT_AMBULATORY_CARE_PROVIDER_SITE_OTHER): Payer: Medicaid Other | Admitting: Family Medicine

## 2015-04-13 ENCOUNTER — Encounter: Payer: Self-pay | Admitting: Family Medicine

## 2015-04-13 ENCOUNTER — Ambulatory Visit
Admission: RE | Admit: 2015-04-13 | Discharge: 2015-04-13 | Disposition: A | Discharge status: Home / Self Care | Payer: Medicaid Other | Source: Ambulatory Visit | Attending: Family Medicine | Admitting: Family Medicine

## 2015-04-13 VITALS — BP 116/84 | HR 91 | Temp 97.9°F | Ht 65.0 in | Wt 164.8 lb

## 2015-04-13 DIAGNOSIS — M25561 Pain in right knee: Secondary | ICD-10-CM

## 2015-04-13 DIAGNOSIS — Z23 Encounter for immunization: Secondary | ICD-10-CM | POA: Diagnosis not present

## 2015-04-13 DIAGNOSIS — E1129 Type 2 diabetes mellitus with other diabetic kidney complication: Secondary | ICD-10-CM

## 2015-04-13 LAB — POCT GLYCOSYLATED HEMOGLOBIN (HGB A1C): Hemoglobin A1C: 6.7

## 2015-04-13 MED ORDER — GLUCOSE BLOOD VI STRP: ORAL_STRIP | Status: DC

## 2015-04-13 NOTE — Patient Instructions (Signed)
Go get knee xrays Take tylenol for pain Can also try ice pack If not better in 2-3 weeks return for another visit  A1c is good. Continue checking sugars daily. Can check a second time if necessary. Have eye doctor send [place] records Sent in more strips for you  See me in 3 months, sooner if problems  Happy Holidays!  [name]

## 2015-04-13 NOTE — Progress Notes (Signed)
Date of Visit: 04/13/2015   HPI:  Patient presents for follow up of diabetes.   diabetes - caregiver notes some elevated blood sugars recently. Has had sugars around 200 for 2 days, then 198 after that. Here recently have been more normal. Checking sugars once per day. Does need increased number of strips as sometimes patient messes up the process of checking her sugar (has history of cognitive delay). Otherwise doing well.  Knee pain - having pain in R knee for a couple of weeks. No known injury. Has not taken any medicine for the pain or tried any ice packs.   ROS: See HPI.  [place]: history of schizophrenia, cognitive delay, diabetes  PHYSICAL EXAM: BP 116/84 mmHg  Pulse 91  Temp(Src) 97.9 F (36.6 C) (Oral)  Ht 5' 5"  (1.651 m)  Wt 164 lb 12.8 oz (74.753 kg)  BMI 27.42 kg/m2 Gen: NAD, pleasant, cooperative HEENT: NCAT Heart: regular rate and rhythm no murmur Lungs: clear to auscultation bilaterally, normal work of breathing  Neuro: alert, at patient's baseline, speech normal Ext: bilateral knees without effusion, erythema, or tenderness. LCL and MCL intact to varus and valgus stressing bilateral. +crepitus with fleixon & extension of R knee.   ASSESSMENT/PLAN:  Health maintenance:  -flu shot given today   Type 2 diabetes mellitus with renal manifestations Well controlled with A1c of 6.7 today. Congratulated patient. She is also down 5 lb.  Cardiac: on statin, aspirin Renal: holding ACE due to previously low BP Eye: asked caregiver to have records sent to [place] Foot: UTD, due May 2017 Immunizations: flu shot today   R knee pain - with crepitus and exam and no known injury will check xrays to eval for pathology. Recommend tylenol, ice pack. Follow up if not improving in 2-3 weeks.  FOLLOW UP: F/u in 3 months for chronic medical problems  [name], [place]

## 2015-04-15 NOTE — Assessment & Plan Note (Signed)
Well controlled with A1c of 6.7 today. Congratulated patient. She is also down 5 lb.  Cardiac: on statin, aspirin Renal: holding ACE due to previously low BP Eye: asked caregiver to have records sent to [place] Foot: UTD, due May 2017 Immunizations: flu shot today

## 2015-05-01 ENCOUNTER — Encounter: Payer: Self-pay | Admitting: Family Medicine

## 2015-05-09 ENCOUNTER — Other Ambulatory Visit: Payer: Self-pay | Admitting: Family Medicine

## 2015-05-15 ENCOUNTER — Other Ambulatory Visit: Payer: Self-pay | Admitting: Family Medicine

## 2015-05-23 ENCOUNTER — Ambulatory Visit: Payer: Medicaid Other

## 2015-05-31 ENCOUNTER — Ambulatory Visit (INDEPENDENT_AMBULATORY_CARE_PROVIDER_SITE_OTHER): Payer: Medicaid Other | Admitting: *Deleted

## 2015-05-31 VITALS — BP 119/73 | HR 95 | Wt 167.3 lb

## 2015-05-31 DIAGNOSIS — Z3042 Encounter for surveillance of injectable contraceptive: Secondary | ICD-10-CM | POA: Diagnosis present

## 2015-05-31 MED ORDER — MEDROXYPROGESTERONE ACETATE 150 MG/ML IM SUSP
150.0000 mg | Freq: Once | INTRAMUSCULAR | Status: AC
  Administered 2015-05-31: 150 mg via INTRAMUSCULAR

## 2015-06-12 ENCOUNTER — Other Ambulatory Visit: Payer: Self-pay | Admitting: Family Medicine

## 2015-07-01 ENCOUNTER — Other Ambulatory Visit: Payer: Self-pay | Admitting: Family Medicine

## 2015-07-07 ENCOUNTER — Other Ambulatory Visit: Payer: Self-pay | Admitting: Family Medicine

## 2015-07-16 ENCOUNTER — Encounter: Payer: Self-pay | Admitting: Family Medicine

## 2015-07-16 ENCOUNTER — Ambulatory Visit (INDEPENDENT_AMBULATORY_CARE_PROVIDER_SITE_OTHER): Payer: Medicaid Other | Admitting: Family Medicine

## 2015-07-16 VITALS — BP 108/67 | HR 109 | Temp 98.2°F | Ht 65.0 in | Wt 171.8 lb

## 2015-07-16 DIAGNOSIS — R109 Unspecified abdominal pain: Secondary | ICD-10-CM | POA: Diagnosis not present

## 2015-07-16 DIAGNOSIS — N183 Chronic kidney disease, stage 3 unspecified: Secondary | ICD-10-CM

## 2015-07-16 DIAGNOSIS — K5909 Other constipation: Secondary | ICD-10-CM | POA: Diagnosis not present

## 2015-07-16 DIAGNOSIS — E1129 Type 2 diabetes mellitus with other diabetic kidney complication: Secondary | ICD-10-CM

## 2015-07-16 LAB — POCT WET PREP (WET MOUNT): CLUE CELLS WET PREP WHIFF POC: NEGATIVE

## 2015-07-16 LAB — POCT URINALYSIS DIPSTICK
BILIRUBIN UA: NEGATIVE
Glucose, UA: NEGATIVE
Ketones, UA: NEGATIVE
NITRITE UA: NEGATIVE
PH UA: 6
PROTEIN UA: NEGATIVE
RBC UA: NEGATIVE
Spec Grav, UA: 1.015
UROBILINOGEN UA: 0.2

## 2015-07-16 LAB — BASIC METABOLIC PANEL
BUN: 20 mg/dL (ref 7–25)
CALCIUM: 8.9 mg/dL (ref 8.6–10.2)
CHLORIDE: 108 mmol/L (ref 98–110)
CO2: 21 mmol/L (ref 20–31)
Creat: 1.58 mg/dL — ABNORMAL HIGH (ref 0.50–1.10)
GLUCOSE: 237 mg/dL — AB (ref 65–99)
POTASSIUM: 4.4 mmol/L (ref 3.5–5.3)
SODIUM: 140 mmol/L (ref 135–146)

## 2015-07-16 LAB — POCT UA - MICROSCOPIC ONLY

## 2015-07-16 LAB — POCT URINE PREGNANCY: PREG TEST UR: NEGATIVE

## 2015-07-16 LAB — POCT GLYCOSYLATED HEMOGLOBIN (HGB A1C): HEMOGLOBIN A1C: 7.5

## 2015-07-16 MED ORDER — POLYETHYLENE GLYCOL 3350 17 GM/SCOOP PO POWD: ORAL | Status: DC

## 2015-07-16 NOTE — Patient Instructions (Signed)
Increase miralax to 17g twice a day. Can go up to 3 times a day if needed Want her to stool every day  Checking kidney function today  I will call you if we need to do anything about her results  I will send you a letter with all of her medical doctors & their contact info  Work on healthy eating  See me in 4 weeks to see how the stooling is doing  Be well, [name]

## 2015-07-17 NOTE — Progress Notes (Signed)
Date of Visit: 07/16/2015   HPI:  Patient presents for follow up. She is accompanied by her new caregiver, Naida Sleight (cell [phone]). Her prior caregiver Morrell Riddle retired. Patient resides in the same house, with the same roommate, who also receives care services.  Diabetes - sugars have been elevated recently. Currently taking glipizide 54m twice daily and januvia 560mdaily. Fasting sugars have run 160's-190's. Has had some dietary indiscretions. Patient would not like to go to diabetic nutritionist for education.  Constipation - having hard stools, has to strain. stooling about every other day. Denies blood in stool. Takes miralax daily.   Vaginal odor - patient reports odor occuring, smells bad. Denies dysuria, fevers, or vaginal discharge. Denies being sexually active.   ROS: See HPI.  [place], ckd 3, chronic constipation, mental retardation, schizophrenia, SVT, type 2 diabetes   PHYSICAL EXAM: BP 108/67 mmHg  Pulse 109  Temp(Src) 98.2 F (36.8 C) (Oral)  Ht 5' 5"  (1.651 m)  Wt 171 lb 12.8 oz (77.928 kg)  BMI 28.59 kg/m2 Gen: NAD, pleasant, cooperative HEENT: NCAT Heart: regular rate and rhythm, no murmur Lungs: clear to auscultation bilaterally, normal work of breathing  Neuro: alert, grossly nonfocal, speech normal GU: normal appearing external genitalia. No lesions or malodorous discharge. Blind wet prep obtained (pt historically very intolerant of speculum exams) Abdomen: fullness in lower abdomen. No significant tenderness. No peritoneal signs or guarding. No discrete masses. Ext: atraumatic  ASSESSMENT/PLAN:  CONSTIPATION, CHRONIC Uncontrolled. Suspect fullness in abdomen is stool burden. Will increase miralax to twice daily, can go up to three times daily. Advised goal is stool every day that is soft. Follow up with me in 4 weeks to see how things are going.  CHRONIC KIDNEY DISEASE STAGE III (MODERATE) Check bmet today to monitor renal  fxn.   Type 2 diabetes mellitus with renal manifestations A1c 7.5 today, up from last value of 6.7. Fasting sugars uncontrolled in the last month. Has gained back some weight. Suspect dietary changes are main culprit. Encouraged adherence to diabetic diet. Discussed that only remaining options are injectable medications. Patient would like to avoid this. Follow up on diabetes in 3 months.   Vaginal odor - suspect this is hygiene related. No abnormalities on external genital exam. Patient would not tolerate speculum or bimanual exam. Will check wet prep. Encouraged good self hygiene.  FOLLOW UP: Follow up in 4 weeks for constipation  [place]. [place]Ardelia [place]ems[place]

## 2015-07-20 ENCOUNTER — Encounter: Payer: Self-pay | Admitting: Family Medicine

## 2015-07-20 NOTE — Assessment & Plan Note (Signed)
Check bmet today to monitor renal fxn.

## 2015-07-20 NOTE — Assessment & Plan Note (Signed)
Uncontrolled. Suspect fullness in abdomen is stool burden. Will increase miralax to twice daily, can go up to three times daily. Advised goal is stool every day that is soft. Follow up with me in 4 weeks to see how things are going.

## 2015-07-20 NOTE — Assessment & Plan Note (Signed)
A1c 7.5 today, up from last value of 6.7. Fasting sugars uncontrolled in the last month. Has gained back some weight. Suspect dietary changes are main culprit. Encouraged adherence to diabetic diet. Discussed that only remaining options are injectable medications. Patient would like to avoid this. Follow up on diabetes in 3 months.

## 2015-08-09 ENCOUNTER — Encounter: Payer: Self-pay | Admitting: Family Medicine

## 2015-08-09 ENCOUNTER — Ambulatory Visit (INDEPENDENT_AMBULATORY_CARE_PROVIDER_SITE_OTHER): Payer: Medicaid Other | Admitting: Family Medicine

## 2015-08-09 VITALS — BP 114/81 | HR 104 | Temp 98.0°F | Wt 169.2 lb

## 2015-08-09 DIAGNOSIS — R05 Cough: Secondary | ICD-10-CM

## 2015-08-09 DIAGNOSIS — R059 Cough, unspecified: Secondary | ICD-10-CM

## 2015-08-09 DIAGNOSIS — Z3042 Encounter for surveillance of injectable contraceptive: Secondary | ICD-10-CM

## 2015-08-09 DIAGNOSIS — Z3049 Encounter for surveillance of other contraceptives: Secondary | ICD-10-CM

## 2015-08-09 DIAGNOSIS — K5909 Other constipation: Secondary | ICD-10-CM | POA: Diagnosis not present

## 2015-08-09 MED ORDER — MEDROXYPROGESTERONE ACETATE 150 MG/ML IM SUSP
150.0000 mg | INTRAMUSCULAR | Status: DC
  Administered 2015-08-09: 150 mg via INTRAMUSCULAR

## 2015-08-09 NOTE — Patient Instructions (Signed)
Likely this is a virus Can try mucinex if needed for cough  Continue miralax 17g twice a day for constipation  See me in 2 months for diabetes  Be well, [name]   Upper Respiratory Infection, Adult Most upper respiratory infections (URIs) are a viral infection of the air passages leading to the lungs. A URI affects the nose, throat, and upper air passages. The most common type of URI is nasopharyngitis and is typically referred to as "the common cold." URIs run their course and usually go away on their own. Most of the time, a URI does not require medical attention, but sometimes a bacterial infection in the upper airways can follow a viral infection. This is called a secondary infection. Sinus and middle ear infections are common types of secondary upper respiratory infections. Bacterial pneumonia can also complicate a URI. A URI can worsen asthma and chronic obstructive pulmonary disease (COPD). Sometimes, these complications can require emergency medical care and may be life threatening.  CAUSES Almost all URIs are caused by viruses. A virus is a type of germ and can spread from one person to another.  RISKS FACTORS You may be at risk for a URI if:   You smoke.   You have chronic heart or lung disease.  You have a weakened defense (immune) system.   You are very young or very old.   You have nasal allergies or asthma.  You work in crowded or poorly ventilated areas.  You work in health care facilities or schools. SIGNS AND SYMPTOMS  Symptoms typically develop 2-3 days after you come in contact with a cold virus. Most viral URIs last 7-10 days. However, viral URIs from the influenza virus (flu virus) can last 14-18 days and are typically more severe. Symptoms may include:   Runny or stuffy (congested) nose.   Sneezing.   Cough.   Sore throat.   Headache.   Fatigue.   Fever.   Loss of appetite.   Pain in your forehead, behind your eyes, and over  your cheekbones (sinus pain).  Muscle aches.  DIAGNOSIS  Your health care provider may diagnose a URI by:  Physical exam.  Tests to check that your symptoms are not due to another condition such as:  Strep throat.  Sinusitis.  Pneumonia.  Asthma. TREATMENT  A URI goes away on its own with time. It cannot be cured with medicines, but medicines may be prescribed or recommended to relieve symptoms. Medicines may help:  Reduce your fever.  Reduce your cough.  Relieve nasal congestion. HOME CARE INSTRUCTIONS   Take medicines only as directed by your health care provider.   Gargle warm saltwater or take cough drops to comfort your throat as directed by your health care provider.  Use a warm mist humidifier or inhale steam from a shower to increase air moisture. This may make it easier to breathe.  Drink enough fluid to keep your urine clear or pale yellow.   Eat soups and other clear broths and maintain good nutrition.   Rest as needed.   Return to work when your temperature has returned to normal or as your health care provider advises. You may need to stay home longer to avoid infecting others. You can also use a face mask and careful hand washing to prevent spread of the virus.  Increase the usage of your inhaler if you have asthma.   Do not use any tobacco products, including cigarettes, chewing tobacco, or electronic cigarettes. If you  need help quitting, ask your health care provider. PREVENTION  The best way to protect yourself from getting a cold is to practice good hygiene.   Avoid oral or hand contact with people with cold symptoms.   Wash your hands often if contact occurs.  There is no clear evidence that vitamin C, vitamin E, echinacea, or exercise reduces the chance of developing a cold. However, it is always recommended to get plenty of rest, exercise, and practice good nutrition.  SEEK MEDICAL CARE IF:   You are getting worse rather than better.    Your symptoms are not controlled by medicine.   You have chills.  You have worsening shortness of breath.  You have brown or red mucus.  You have yellow or brown nasal discharge.  You have pain in your face, especially when you bend forward.  You have a fever.  You have swollen neck glands.  You have pain while swallowing.  You have white areas in the back of your throat. SEEK IMMEDIATE MEDICAL CARE IF:   You have severe or persistent:  Headache.  Ear pain.  Sinus pain.  Chest pain.  You have chronic lung disease and any of the following:  Wheezing.  Prolonged cough.  Coughing up blood.  A change in your usual mucus.  You have a stiff neck.  You have changes in your:  Vision.  Hearing.  Thinking.  Mood. MAKE SURE YOU:   Understand these instructions.  Will watch your condition.  Will get help right away if you are not doing well or get worse.   This information is not intended to replace advice given to you by your health care provider. Make sure you discuss any questions you have with your health care provider.   Document Released: 10/22/2000 Document Revised: 09/12/2014 Document Reviewed: 08/03/2013 Elsevier Interactive Patient Education [employer].

## 2015-08-09 NOTE — Progress Notes (Signed)
Date of Visit: 08/09/2015   HPI:  Patient presents for a same day appointment to discuss URI symptoms.    Has had cough for 2 days. Goes to adult day center and was told she needed to be seen by physician in order to go back there. No fever. Eating and drinking well.. Stooling and urinating normally. No runny nose or sore throat.  Constipation - taking miralax twice daily. Has bowel movement every other day. No blood in stool. No diarrhea. Stool good consistency. Abdomen feels much better now.  ROS: See HPI  [place]: history of acne, CKD3, chronic constipation, mental retardation, schizophrenia, SVT, type 2 diabetes   PHYSICAL EXAM: BP 114/81 mmHg  Pulse 104  Temp(Src) 98 F (36.7 C) (Oral)  Wt 169 lb 3.2 oz (76.749 kg) Gen: NAD, pleasant, cooperative HEENT: normocephalic, atraumatic, moist mucous membranes, oropharynx clear and moist, nares patent, tympanic membranes clear bilaterally, No anterior cervical or supraclavicular lymphadenopathy.  Heart: regular rate and rhythm, no murmur Lungs: clear to auscultation bilaterally, normal work of breathing  Abdomen: soft, nontender to palpation, no masses or organomegaly, normoactive bowel sounds  Neuro: alert, grossly nonfocal, speech normal  ASSESSMENT/PLAN:  Cough - likely viral in etiology. No fever or signs of bacterial infection on exam. Recommend supportive care. Letter written stating she can go back to her day program.  CONSTIPATION, CHRONIC Improved. Continue twice daily miralax and daily colace.     FOLLOW UP: Follow up in 2 months for diabetes   [name], [place]

## 2015-08-10 NOTE — Assessment & Plan Note (Signed)
Improved. Continue twice daily miralax and daily colace.

## 2015-08-13 ENCOUNTER — Ambulatory Visit: Payer: Medicaid Other | Admitting: Family Medicine

## 2015-08-16 ENCOUNTER — Ambulatory Visit: Payer: Medicaid Other

## 2015-09-19 ENCOUNTER — Ambulatory Visit (INDEPENDENT_AMBULATORY_CARE_PROVIDER_SITE_OTHER): Payer: Medicaid Other | Admitting: Family Medicine

## 2015-09-19 ENCOUNTER — Encounter: Payer: Self-pay | Admitting: Family Medicine

## 2015-09-19 VITALS — BP 96/78 | HR 101 | Temp 97.7°F | Ht 65.0 in | Wt 172.3 lb

## 2015-09-19 DIAGNOSIS — F79 Unspecified intellectual disabilities: Secondary | ICD-10-CM | POA: Diagnosis not present

## 2015-09-19 DIAGNOSIS — Z593 Problems related to living in residential institution: Secondary | ICD-10-CM

## 2015-09-19 DIAGNOSIS — Z0289 Encounter for other administrative examinations: Secondary | ICD-10-CM

## 2015-09-19 DIAGNOSIS — E118 Type 2 diabetes mellitus with unspecified complications: Secondary | ICD-10-CM | POA: Diagnosis not present

## 2015-09-19 DIAGNOSIS — Z6222 Institutional upbringing: Secondary | ICD-10-CM

## 2015-09-19 NOTE — Patient Instructions (Signed)
Completed physical forms today Please call her eye doctor and get the records sent to [place] from her last visit  Follow up with me in 1 month for diabetes.  Be well, [name]

## 2015-09-20 ENCOUNTER — Telehealth: Payer: Self-pay | Admitting: Family Medicine

## 2015-09-20 NOTE — Telephone Encounter (Signed)
Caregiver brought in standing physican order form to be completed.  Please call when ready and she will pick it up

## 2015-09-21 NOTE — Telephone Encounter (Signed)
Form placed in PCP box 

## 2015-09-23 NOTE — Progress Notes (Signed)
Date of Visit: 09/19/2015   HPI:  Patient presents in order to have paperwork completed.  Needs forms filled out for her day program as well as for her group home.   Only concerns she identifies today are having an odor in her vulvar area. We worked this up previously and she had negative urinalysis and wet prep. Patient admits to not wiping her vulva every time she uses the bathroom.  ROS: See HPI.  [place]: diabetes, schizophrenia, mental retardation  PHYSICAL EXAM: BP 96/78 mmHg  Pulse 101  Temp(Src) 97.7 F (36.5 C) (Oral)  Ht 5' 5"  (1.651 m)  Wt 172 lb 4.8 oz (78.155 kg)  BMI 28.67 kg/m2 Gen: NAD, pleasant, cooperative HEENT: normocephalic, atraumatic, moist mucous membranes, no thyromegaly, No anterior cervical or supraclavicular lymphadenopathy.  Heart: regular rate and rhythm, no murmur Lungs: clear to auscultation bilaterally, normal work of breathing  Neuro: alert, grossly nonfocal, speech normal, at patient's baseline Ext: atraumatic, no edema Diabetic foot exam: 2+ DP pulses bilat, normal monofilament testing bilaterally. No lesions or significant calluses.   ASSESSMENT/PLAN:  Health maintenance:  -diabetic foot exam done today -encouraged caregiver to have ophtho exam records sent to [place]  Vulvar odor - encouraged her to wipe well after urinating/stooling. Suspect hygiene the main contributor here.   Forms completed for school and for group home. Copies will be scanned into chart. Follow up in 1 month for diabetes.   FOLLOW UP: Follow up in 1 month for diabetes  [name], [place]

## 2015-10-01 NOTE — Telephone Encounter (Signed)
Ms. Aggie Moats, Ms. Peffley's provider need to have form completed in order for them to dispense medication to patient.  Form was left on 5/11.  Need to have t his completed picked up this week.

## 2015-10-01 NOTE — Telephone Encounter (Signed)
Returned call to caregiver Apologized for the delay - I had the form signed but had not returned it to her yet Offered to fax it. She would like to pick it up tomorrow. Will place at front desk for her to pick up & scan copy into chart.  Leeanne Rio, MD

## 2015-11-03 ENCOUNTER — Other Ambulatory Visit: Payer: Self-pay | Admitting: Family Medicine

## 2015-11-07 ENCOUNTER — Ambulatory Visit (INDEPENDENT_AMBULATORY_CARE_PROVIDER_SITE_OTHER): Payer: Medicaid Other | Admitting: *Deleted

## 2015-11-07 DIAGNOSIS — Z3042 Encounter for surveillance of injectable contraceptive: Secondary | ICD-10-CM | POA: Diagnosis not present

## 2015-11-07 MED ORDER — MEDROXYPROGESTERONE ACETATE 150 MG/ML IM SUSY
150.0000 mg | PREFILLED_SYRINGE | Freq: Once | INTRAMUSCULAR | Status: AC
  Administered 2015-11-07: 150 mg via INTRAMUSCULAR

## 2015-11-07 NOTE — Progress Notes (Signed)
   Pt in for Depo Provera injection.  Pt tolerated Depo injection. Depo given right upper outer quadrant.  Next injection due Sept 13-27, 2017.  Reminder card given. Derl Barrow, RN

## 2015-12-17 ENCOUNTER — Telehealth: Payer: Self-pay | Admitting: *Deleted

## 2015-12-17 NOTE — Telephone Encounter (Signed)
Prior Authorization received from [employer] for Januvia 50 mg. Formulary and PA form placed in provider box for completion. Derl Barrow, RN

## 2015-12-20 NOTE — Telephone Encounter (Signed)
Completed, will return to [facility] Leeanne Rio, MD

## 2015-12-20 NOTE — Telephone Encounter (Signed)
PA pending for Januvia 50 mg per [employer].   Derl Barrow, RN

## 2015-12-21 NOTE — Telephone Encounter (Signed)
Received PA approval for Januvia 50 mg via [employer].  Med approved for 12/20/15 - 12/19/16.  [facility] pharmacy informed.  PA approval number [id number]. Derl Barrow, RN

## 2016-01-28 ENCOUNTER — Ambulatory Visit (INDEPENDENT_AMBULATORY_CARE_PROVIDER_SITE_OTHER): Payer: Medicaid Other | Admitting: *Deleted

## 2016-01-28 DIAGNOSIS — Z23 Encounter for immunization: Secondary | ICD-10-CM

## 2016-01-28 DIAGNOSIS — Z3042 Encounter for surveillance of injectable contraceptive: Secondary | ICD-10-CM

## 2016-01-28 MED ORDER — MEDROXYPROGESTERONE ACETATE 150 MG/ML IM SUSP
150.0000 mg | Freq: Once | INTRAMUSCULAR | Status: AC
  Administered 2016-01-28: 150 mg via INTRAMUSCULAR

## 2016-01-28 NOTE — Progress Notes (Signed)
   Pt in for Depo Provera injection.  Pt tolerated Depo injection. Depo given left upper outer quadrant.  Next injection due Dec 4-18, 2017.  Reminder card given. Derl Barrow, RN

## 2016-02-03 ENCOUNTER — Other Ambulatory Visit: Payer: Self-pay | Admitting: Internal Medicine

## 2016-02-14 LAB — HM DIABETES EYE EXAM

## 2016-03-04 ENCOUNTER — Other Ambulatory Visit: Payer: Self-pay | Admitting: Internal Medicine

## 2016-03-04 ENCOUNTER — Encounter: Payer: Self-pay | Admitting: Internal Medicine

## 2016-03-17 ENCOUNTER — Ambulatory Visit: Payer: Medicaid Other | Admitting: Internal Medicine

## 2016-03-18 ENCOUNTER — Ambulatory Visit (INDEPENDENT_AMBULATORY_CARE_PROVIDER_SITE_OTHER): Payer: Medicaid Other | Admitting: Internal Medicine

## 2016-03-18 ENCOUNTER — Encounter: Payer: Self-pay | Admitting: Internal Medicine

## 2016-03-18 VITALS — BP 114/80 | HR 103 | Ht 62.0 in | Wt 179.4 lb

## 2016-03-18 DIAGNOSIS — I471 Supraventricular tachycardia: Secondary | ICD-10-CM | POA: Diagnosis not present

## 2016-03-18 MED ORDER — METOPROLOL SUCCINATE ER 25 MG PO TB24: 25.0000 mg | ORAL_TABLET | Freq: Every day | ORAL | 3 refills | Status: DC

## 2016-03-18 MED ORDER — ATORVASTATIN CALCIUM 20 MG PO TABS: ORAL_TABLET | ORAL | 3 refills | Status: DC

## 2016-03-18 NOTE — Progress Notes (Signed)
HPI [name] returns today for followup of SVT. She is a very pleasant 40 year old woman with a history of tachycardia palpitations and SVT. The patient has not had any recurrent arrhythmias in the past year. She is not a particularly good historian, as she has schizoaffective disorder. She is heavily medicated.  She has gained weight. She has tried to increase her physical activity.  Allergies  Allergen Reactions  . Minocycline     unknown     Current Outpatient Prescriptions  Medication Sig Dispense Refill  . ACCU-CHEK FASTCLIX LANCETS MISC Check blood sugar each morning 102 each 12  . atorvastatin (LIPITOR) 20 MG tablet TAKE ONE TABLET EACH DAY 90 tablet 3  . benztropine (COGENTIN) 1 MG tablet Take 1 mg by mouth 2 (two) times daily.      . Blood Glucose Monitoring Suppl (ACCU-CHEK AVIVA PLUS) W/DEVICE KIT CHECK BLOOD GLUCOSE (SUGAR) EVERY MORNING 1 kit 0  . clindamycin (CLINDAGEL) 1 % gel APPLY DAILY 30 g 5  . clozapine (FAZACLO) 100 MG disintegrating tablet Take 400 mg by mouth at bedtime.     . divalproex (DEPAKOTE) 500 MG 24 hr tablet Take 500 mg by mouth 2 (two) times daily at 10 AM and 5 PM.     . docusate sodium (DOK) 100 MG capsule TAKE ONE CAPSULE EACH DAY (Patient taking differently: Take 100 mg by mouth daily. ) 30 capsule 5  . Emollient (CETAPHIL) cream APPLY DAILY 236 g 3  . famotidine (PEPCID) 20 MG tablet TAKE ONE TABLET TWICE DAILY 60 tablet 11  . GAS-X EXTRA STRENGTH 125 MG chewable tablet TAKE ONE TABLET DAILY AS NEEDED FOR GAS 30 tablet 3  . glipiZIDE (GLUCOTROL) 10 MG tablet TAKE ONE TABLET TWICE DAILY BEFORE MEALS 180 tablet 3  . glucose blood (ACCU-CHEK AVIVA PLUS) test strip Use as instructed to check blood sugar twice daily. 100 each 12  . JANUVIA 50 MG tablet TAKE ONE TABLET EACH DAY 30 tablet 11  . LORazepam (ATIVAN) 0.5 MG tablet Take 1 tablet (0.5 mg total) by mouth every 8 (eight) hours as needed for anxiety. 9 tablet 0  . medroxyPROGESTERone  (DEPO-PROVERA) 150 MG/ML injection Inject 1 mL (150 mg total) into the muscle every 3 (three) months.    . metoprolol succinate (TOPROL-XL) 25 MG 24 hr tablet TAKE ONE TABLET EVERY DAY 30 tablet 0  . PARoxetine (PAXIL) 30 MG tablet Take 60 mg by mouth at bedtime.     . polyethylene glycol powder (GLYCOLAX/MIRALAX) powder TAKE 17 GRAMS  Twice daily. Can go up to 3 times daily if needed. 527 g 11  . QC CHILDRENS ASPIRIN 81 MG chewable tablet CHEW ONE TABLET DAILY (Patient taking differently: CHEW 81 MG BY MOUTH DAILY) 30 tablet 11  . QC NON-ASPIRIN EXTRA STRENGTH 500 MG tablet ONE TABLET EVERY EIGHT HOURS AS NEEDED FOR MILD PAIN OR FEVER 30 tablet 3   No current facility-administered medications for this visit.      Past Medical History:  Diagnosis Date  . Acne   . Anemia   . Diabetes mellitus without complication ([place])   . Internal hemorrhoid, bleeding 10/22/2010  . Mild mental retardation   . Obesity   . Patient overweight   . Psychic disease   . Schizoaffective disorder   . SVT (supraventricular tachycardia) (HCC)    with short PR interval - followed by Dr. Lovena Le    ROS:   All systems reviewed and negative except as noted  in the HPI.   Past Surgical History:  Procedure Laterality Date  . BREAST REDUCTION SURGERY  08/10/2005  . HYSTEROSCOPY  09/09/2004   w/ removal of polyps     Family History  Problem Relation Age of Onset  . Schizophrenia Sister      Social History   Social History  . Marital status: Single    Spouse name: N/A  . Number of children: N/A  . Years of education: N/A   Occupational History  . Not on file.   Social History Main Topics  . Smoking status: Never Smoker  . Smokeless tobacco: Never Used  . Alcohol use No  . Drug use: No  . Sexual activity: No   Other Topics Concern  . Not on file   Social History Narrative   Caregiver is Morrell Riddle. [place] [facility] [place]. Cell [phone]/[id number] lived in  group home. Twin sister with similar problem list. Has 24H care.      Case manager is Justin Mend, [phone].      Caregiver works for [employer], [address] [place], [place] [address]. Abbey Chatters is clinical supervisor, [phone].      Sammuel Bailiff is [employer] [place] [employer] [place] [employer] [place] [phone] office. Cell [phone].     BP 114/80   Pulse (!) 103   Ht _0  (1.575 m)   Wt 179 lb 6.4 oz (81.4 kg)   SpO2 98%   BMI 32.81 kg/m   Physical Exam:  Stable appearing 40 year old woman, NAD HEENT: Unremarkable Neck:  6 cm JVD, no thyromegally Back:  No CVA tenderness Lungs:  Clear with no wheezes, rales, or rhonchi. HEART:  Regular rate rhythm, no murmurs, no rubs, no clicks Abd:  soft, positive bowel sounds, no organomegally, no rebound, no guarding Ext:  2 plus pulses, no edema, no cyanosis, no clubbing Skin:  No rashes no nodules Neuro:  CN II through XII intact, motor grossly intact  ECG - nsr/sinus tachy  Assess/Plan: 1. SVT - she has had no symptoms since her last clinic visit. She will continue her current meds. 2. Overweight - she has gained weight. She is in a group home. I have asked her to eat less and exercise more.  3. HTN - her blood pressure is controlled. Will follow.  Mikle Bosworth.D.

## 2016-03-18 NOTE — Patient Instructions (Addendum)
Medication Instructions:  Your physician recommends that you continue on your current medications as directed. Please refer to the Current Medication list given to you today.   Labwork: None Ordered   Testing/Procedures: None Ordered  Follow-Up: Your physician wants you to follow-up in: 1 year with Dr. Lovena Le. You will receive a reminder letter in the mail two months in advance. If you don't receive a letter, please call our office to schedule the follow-up appointment.   Any Other Special Instructions Will Be Listed Below (If Applicable). Increase exercise and try to lose some weight.    If you need a refill on your cardiac medications before your next appointment, please call your pharmacy.

## 2016-03-19 ENCOUNTER — Telehealth: Payer: Self-pay | Admitting: Family Medicine

## 2016-03-19 NOTE — Telephone Encounter (Signed)
Patient's caretaker asks PCP to update instructions about Polyethylene glycol powder that patient takes one dose (17 grams) at bedtime instead of 3 times daily what it says on the bottle label. This request is based on the Department of Health and Social Services' audit. Please, follow up with Ms. Nevada Crane.

## 2016-03-26 MED ORDER — POLYETHYLENE GLYCOL 3350 17 GM/SCOOP PO POWD: 17.0000 g | Freq: Every day | ORAL | 11 refills | Status: DC

## 2016-03-26 NOTE — Telephone Encounter (Signed)
Rx fixed to say take at bedtime (17g) Can take up to 3x/day if needed for constipation Please inform caregiver I made this fix & sent in new rx Thanks Leeanne Rio, MD

## 2016-03-27 NOTE — Telephone Encounter (Signed)
Pt informed and spoke with dr Kandyce Rud about the change. Deseree Kennon Holter, CMA

## 2016-03-31 MED ORDER — POLYETHYLENE GLYCOL 3350 17 GM/SCOOP PO POWD: 17.0000 g | Freq: Every day | ORAL | 11 refills | Status: DC

## 2016-03-31 NOTE — Telephone Encounter (Signed)
Patient's caregiver in clinic needing the Rx for Miralax clarified due to state inspection.  New Rx sent in to patient's pharmacy per verbal order by [name].  Derl Barrow, RN

## 2016-04-01 ENCOUNTER — Other Ambulatory Visit: Payer: Self-pay | Admitting: Family Medicine

## 2016-04-28 ENCOUNTER — Ambulatory Visit (INDEPENDENT_AMBULATORY_CARE_PROVIDER_SITE_OTHER): Payer: Medicaid Other | Admitting: *Deleted

## 2016-04-28 DIAGNOSIS — Z3040 Encounter for surveillance of contraceptives, unspecified: Secondary | ICD-10-CM | POA: Diagnosis present

## 2016-04-28 DIAGNOSIS — Z3042 Encounter for surveillance of injectable contraceptive: Secondary | ICD-10-CM

## 2016-04-28 MED ORDER — MEDROXYPROGESTERONE ACETATE 150 MG/ML IM SUSP
150.0000 mg | Freq: Once | INTRAMUSCULAR | Status: AC
  Administered 2016-04-28: 150 mg via INTRAMUSCULAR

## 2016-04-28 NOTE — Progress Notes (Signed)
   Pt in for Depo Provera injection.  Pt tolerated Depo injection. Depo given right upper outer quadrant.  Next injection due March 5-19, 2018.  Reminder card given. Derl Barrow, RN

## 2016-04-29 ENCOUNTER — Other Ambulatory Visit: Payer: Self-pay | Admitting: Family Medicine

## 2016-05-07 NOTE — Telephone Encounter (Signed)
Please call caregiver and let them know I will send in refills, but Myley needs to come in for an appointment to follow up on diabetes. I haven't seen her for this since March.  Thanks, Leeanne Rio, MD

## 2016-05-14 NOTE — Telephone Encounter (Signed)
Left message for caregiver to call office back. If she does please have her schedule appointment for Leshia. Nat Christen, CMA

## 2016-05-14 NOTE — Telephone Encounter (Signed)
Patients caregiver called back. Scheduled patient for 05/26/16. Nat Christen, CMA

## 2016-05-26 ENCOUNTER — Other Ambulatory Visit: Payer: Medicaid Other

## 2016-05-26 ENCOUNTER — Ambulatory Visit (INDEPENDENT_AMBULATORY_CARE_PROVIDER_SITE_OTHER): Payer: Medicaid Other | Admitting: Family Medicine

## 2016-05-26 ENCOUNTER — Encounter: Payer: Self-pay | Admitting: Family Medicine

## 2016-05-26 VITALS — BP 118/78 | HR 99 | Temp 98.0°F | Ht 65.0 in | Wt 174.0 lb

## 2016-05-26 DIAGNOSIS — L299 Pruritus, unspecified: Secondary | ICD-10-CM | POA: Diagnosis not present

## 2016-05-26 DIAGNOSIS — K5909 Other constipation: Secondary | ICD-10-CM

## 2016-05-26 DIAGNOSIS — E1129 Type 2 diabetes mellitus with other diabetic kidney complication: Secondary | ICD-10-CM | POA: Diagnosis not present

## 2016-05-26 LAB — POCT WET PREP (WET MOUNT)
Clue Cells Wet Prep Whiff POC: NEGATIVE
TRICHOMONAS WET PREP HPF POC: ABSENT

## 2016-05-26 LAB — POCT GLYCOSYLATED HEMOGLOBIN (HGB A1C): HEMOGLOBIN A1C: 10

## 2016-05-26 NOTE — Progress Notes (Signed)
Date of Visit: 05/26/2016   HPI:  Patient presents for follow up of diabetes. She is accompanied by her caregiver. She is unfortunately a little past due for her follow up - last A1c was checked in March 2017.  Diabetes - check sugar every morning. Over last few months sugars have been running high while fasting (often over 200s). Her group home has initiated some more stringent dietary control for patient, and now sugars are doing better. fastings now in 180s. Taking glipizide 50m twice daily and januvia 542mdaily. Nonfasting today. Can return for fasting labs later this week.  Vaginal odor - brought up by patient. Notes odor in vaginal area when she goes to the bathroom. Tries to wipe well, though caregiver not sure she always does wipe well after going to the bathroom. No history of sex ever. Has a little bit of itching in that area. Ongoing for a while.   Constipation - well controlled on miralax and colace each once daily. No straining for bowel movement.    ROS: See HPI.  PM[place]sterhistory of schizphrenia, mental retardation, diabetes, overweight, chronic constipation, acne  PHYSICAL EXAM: BP 118/78   Pulse 99   Temp 98 F (36.7 C) (Oral)   Ht 5' 5"  (1.651 m)   Wt 174 lb (78.9 kg)   SpO2 99%   BMI 28.96 kg/m  Gen: no acute distress, pleasant, cooperative HEENT: normocephalic, atraumatic, moist mucous membranes  Heart: regular rate and rhythm, no murmur Lungs: clear to auscultation bilaterally, normal work of breathing  Neuro: alert, speech normal, grossly nonfocal Ext: No appreciable lower extremity edema bilaterally  GU: normal appearing external female genitalia. No rashes or abnormal discharged noted. Blind wet prep obtained.  ASSESSMENT/PLAN:  Health maintenance:  -will get records from eye doctor (Dr. 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 Obviously this is quite poorly controlled, likely secondary to dietary indiscretions and life transitions (changed group home caregiver in the last year). Fortunately though, recent changes have been made in the past 3 months to better control sugars. Next option would be to switch to an injectable medication, likely insulin. I would like to avoid this if at all possible given patient's comorbid mental retardation and psychiatric illness. We will do insulin if we have to, but for now will give new dietary measures time to work. Follow up in 3 months for repeat A1c. If remains elevated at that visit, will discuss starting injectable medication. Patient and caregiver agreeable to this plan, and will continue to work on diet.  Cardiac: on statin and aspirin, return for fasting lipids Renal: check renal function with [facility] Eye: had in the fall by Dr. WhVenetia Maxonwill get records Foot: UTD Immunizations: UTD   CONSTIPATION, CHRONIC Well controlled on present regimen. Continue miralax and colace.   Vaginal odor Suspect this is physiologic. Wet prep normal. No abnormalities on external genital exam. No history of sexual contact & has not tolerated speculum exams in the past. Continue to encourage good hygiene.  FOLLOW UP: Follow up in 3 months for diabetes  [place]. [place]delia Mems[place]

## 2016-05-26 NOTE — Patient Instructions (Signed)
Keep working on diet control Stay on current medications  On your way out, schedule an appointment one morning to come back for fasting labs. Do not eat or drink anything other than water the morning of your lab appointment until after your labs are drawn.   Will get eye records  Follow up with me in 3 months If seems like sugars are going up, please follow up sooner  Will call you if we need to do anything with swab results  Be well, [name]

## 2016-05-27 ENCOUNTER — Other Ambulatory Visit: Payer: Medicaid Other

## 2016-05-27 DIAGNOSIS — E1129 Type 2 diabetes mellitus with other diabetic kidney complication: Secondary | ICD-10-CM

## 2016-05-27 LAB — COMPLETE METABOLIC PANEL WITH GFR
ALT: 10 U/L (ref 6–29)
AST: 12 U/L (ref 10–30)
Albumin: 3.7 g/dL (ref 3.6–5.1)
Alkaline Phosphatase: 71 U/L (ref 33–115)
BUN: 16 mg/dL (ref 7–25)
CALCIUM: 9.3 mg/dL (ref 8.6–10.2)
CHLORIDE: 109 mmol/L (ref 98–110)
CO2: 26 mmol/L (ref 20–31)
Creat: 1.77 mg/dL — ABNORMAL HIGH (ref 0.50–1.10)
GFR, Est African American: 40 mL/min — ABNORMAL LOW (ref >=60)
GFR, Est Non African American: 35 mL/min — ABNORMAL LOW (ref >=60)
Glucose, Bld: 227 mg/dL — ABNORMAL HIGH (ref 65–99)
POTASSIUM: 4.9 mmol/L (ref 3.5–5.3)
SODIUM: 142 mmol/L (ref 135–146)
Total Bilirubin: 0.4 mg/dL (ref 0.2–1.2)
Total Protein: 6.5 g/dL (ref 6.1–8.1)

## 2016-05-27 LAB — LIPID PANEL
CHOL/HDL RATIO: 3.2 ratio (ref <5.0)
CHOLESTEROL: 80 mg/dL (ref <200)
HDL: 25 mg/dL — ABNORMAL LOW (ref >50)
LDL CALC: 33 mg/dL (ref <100)
Triglycerides: 108 mg/dL (ref <150)
VLDL: 22 mg/dL (ref <30)

## 2016-05-28 NOTE — Assessment & Plan Note (Signed)
A1c 10.0, up from 7.5. Obviously this is quite poorly controlled, likely secondary to dietary indiscretions and life transitions (changed group home caregiver in the last year). Fortunately though, recent changes have been made in the past 3 months to better control sugars. Next option would be to switch to an injectable medication, likely insulin. I would like to avoid this if at all possible given patient's comorbid mental retardation and psychiatric illness. We will do insulin if we have to, but for now will give new dietary measures time to work. Follow up in 3 months for repeat A1c. If remains elevated at that visit, will discuss starting injectable medication. Patient and caregiver agreeable to this plan, and will continue to work on diet.  Cardiac: on statin and aspirin, return for fasting lipids Renal: check renal function with [facility] Eye: had in the fall by Dr. Venetia Maxon, will get records Foot: UTD Immunizations: UTD

## 2016-05-28 NOTE — Assessment & Plan Note (Signed)
Well controlled on present regimen. Continue miralax and colace.

## 2016-06-18 ENCOUNTER — Telehealth: Payer: Self-pay | Admitting: Family Medicine

## 2016-06-18 DIAGNOSIS — N183 Chronic kidney disease, stage 3 unspecified: Secondary | ICD-10-CM

## 2016-06-18 NOTE — Telephone Encounter (Signed)
Called patient's caregiver Leda Gauze to discuss lab results Lipids look great - given uncontrolled diabetes will continue mod intensity statin (lipitor 20) Renal function down some from when last checked (10 months ago) Patient will return in 1-2 weeks to have bmet rechecked to ensure stability of renal function - suspect this is chronic due to her uncontrolled diabetes Caregiver will call back to schedule lab appointment, she did not have her calendar with her while we were on the phone  Leeanne Rio, MD

## 2016-06-23 ENCOUNTER — Other Ambulatory Visit: Payer: Medicaid Other

## 2016-06-23 DIAGNOSIS — N183 Chronic kidney disease, stage 3 unspecified: Secondary | ICD-10-CM

## 2016-06-23 LAB — BASIC METABOLIC PANEL WITH GFR
BUN: 21 mg/dL (ref 7–25)
CHLORIDE: 111 mmol/L — AB (ref 98–110)
CO2: 22 mmol/L (ref 20–31)
Calcium: 9.2 mg/dL (ref 8.6–10.2)
Creat: 1.79 mg/dL — ABNORMAL HIGH (ref 0.50–1.10)
GFR, EST NON AFRICAN AMERICAN: 34 mL/min — AB (ref >=60)
GFR, Est African American: 40 mL/min — ABNORMAL LOW (ref >=60)
GLUCOSE: 214 mg/dL — AB (ref 65–99)
POTASSIUM: 4.2 mmol/L (ref 3.5–5.3)
Sodium: 142 mmol/L (ref 135–146)

## 2016-06-27 ENCOUNTER — Encounter: Payer: Self-pay | Admitting: Family Medicine

## 2016-07-06 ENCOUNTER — Other Ambulatory Visit: Payer: Self-pay | Admitting: Family Medicine

## 2016-07-10 NOTE — Telephone Encounter (Signed)
Caregiver called about test strip refill.  Pt used the last one this morning.

## 2016-07-16 ENCOUNTER — Ambulatory Visit (INDEPENDENT_AMBULATORY_CARE_PROVIDER_SITE_OTHER): Payer: Medicaid Other

## 2016-07-16 ENCOUNTER — Encounter (HOSPITAL_COMMUNITY): Payer: Self-pay | Admitting: *Deleted

## 2016-07-16 ENCOUNTER — Ambulatory Visit (HOSPITAL_COMMUNITY)
Admission: EM | Admit: 2016-07-16 | Discharge: 2016-07-16 | Disposition: A | Discharge status: Home / Self Care | Payer: Medicaid Other | Attending: Family Medicine | Admitting: Family Medicine

## 2016-07-16 DIAGNOSIS — H1033 Unspecified acute conjunctivitis, bilateral: Secondary | ICD-10-CM

## 2016-07-16 DIAGNOSIS — J069 Acute upper respiratory infection, unspecified: Secondary | ICD-10-CM

## 2016-07-16 DIAGNOSIS — J40 Bronchitis, not specified as acute or chronic: Secondary | ICD-10-CM | POA: Diagnosis not present

## 2016-07-16 DIAGNOSIS — B9789 Other viral agents as the cause of diseases classified elsewhere: Secondary | ICD-10-CM

## 2016-07-16 LAB — POCT URINALYSIS DIP (DEVICE)
Bilirubin Urine: NEGATIVE
Glucose, UA: 250 mg/dL — AB
HGB URINE DIPSTICK: NEGATIVE
Ketones, ur: NEGATIVE mg/dL
Leukocytes, UA: NEGATIVE
Nitrite: NEGATIVE
PROTEIN: NEGATIVE mg/dL
Specific Gravity, Urine: <=1.005 (ref 1.005–1.030)
UROBILINOGEN UA: 0.2 mg/dL (ref 0.0–1.0)
pH: 6 (ref 5.0–8.0)

## 2016-07-16 LAB — POCT I-STAT, CHEM 8
BUN: 15 mg/dL (ref 6–20)
CREATININE: 1.3 mg/dL — AB (ref 0.44–1.00)
Calcium, Ion: 1.22 mmol/L (ref 1.15–1.40)
Chloride: 105 mmol/L (ref 101–111)
GLUCOSE: 256 mg/dL — AB (ref 65–99)
HCT: 36 % (ref 36.0–46.0)
HEMOGLOBIN: 12.2 g/dL (ref 12.0–15.0)
POTASSIUM: 4.1 mmol/L (ref 3.5–5.1)
Sodium: 143 mmol/L (ref 135–145)
TCO2: 24 mmol/L (ref 0–100)

## 2016-07-16 MED ORDER — POLYMYXIN B-TRIMETHOPRIM 10000-0.1 UNIT/ML-% OP SOLN: 1.0000 [drp] | OPHTHALMIC | 0 refills | Status: DC

## 2016-07-16 MED ORDER — FEXOFENADINE HCL 60 MG PO TABS: 60.0000 mg | ORAL_TABLET | Freq: Two times a day (BID) | ORAL | 0 refills | Status: DC

## 2016-07-16 NOTE — Discharge Instructions (Signed)
Tylenol for fever or discomfort. Use the Polytrim eyedrops as directed for the thigh infection. Use warm compresses over the eyes to keep them clean and clear of debris. Also recommend using Allegra or Claritin for runny nose and drainage. For any worsening, new symptoms or problems, trouble breathing or increased cough follow-up your primary care doctor or go to the emergency department. Plenty of fluids and stay well-hydrated. Keep check of blood sugars.

## 2016-07-16 NOTE — ED Provider Notes (Signed)
CSN: [id number]     Arrival date & time 07/16/16  1048 History   First MD Initiated Contact with Patient 07/16/16 1212     Chief Complaint  Patient presents with  . Cough   (Consider location/radiation/quality/duration/timing/severity/associated sxs/prior Treatment) 41 year old female is accompanied by caregiver with complaints of sore throat that developed yesterday and continues today, a fever that developed today, red eyes with some watery drainage and concerned that she falls asleep too often. She is a poor historian in that she does have some cognitive deficits. She also has a diagnosis of mild retardation, schizoaffective disorder, cyclic disease, type 2 diabetes mellitus and SVT.      Past Medical History:  Diagnosis Date  . Acne   . Anemia   . Diabetes mellitus without complication ([place])   . Internal hemorrhoid, bleeding 10/22/2010  . Mild mental retardation   . Obesity   . Patient overweight   . Psychic disease   . Schizoaffective disorder   . SVT (supraventricular tachycardia) (HCC)    with short PR interval - followed by Dr. Lovena Le   Past Surgical History:  Procedure Laterality Date  . BREAST REDUCTION SURGERY  08/10/2005  . HYSTEROSCOPY  09/09/2004   w/ removal of polyps   Family History  Problem Relation Age of Onset  . Schizophrenia Sister    Social History  Substance Use Topics  . Smoking status: Never Smoker  . Smokeless tobacco: Never Used  . Alcohol use No   OB History    No data available     Review of Systems  Constitutional: Positive for activity change and fever.  HENT: Positive for postnasal drip and sore throat. Negative for congestion, ear discharge, ear pain, rhinorrhea and trouble swallowing.   Eyes: Positive for discharge and redness.  Respiratory: Positive for cough.   Gastrointestinal: Negative.   Psychiatric/Behavioral:       Unusual sleepiness today.  All other systems reviewed and are negative.   Allergies  Minocycline  Home  Medications   Prior to Admission medications   Medication Sig Start Date End Date Taking? Authorizing Provider  ACCU-CHEK FASTCLIX LANCETS MISC Check blood sugar each morning 02/22/14   Leeanne Rio, MD  atorvastatin (LIPITOR) 20 MG tablet TAKE ONE TABLET EACH DAY 03/18/16   Evans Lance, MD  benztropine (COGENTIN) 1 MG tablet Take 1 mg by mouth 2 (two) times daily.      Historical Provider, MD  Blood Glucose Monitoring Suppl (ACCU-CHEK AVIVA PLUS) W/DEVICE KIT CHECK BLOOD GLUCOSE (SUGAR) EVERY MORNING 08/14/14   Leeanne Rio, MD  clindamycin (CLINDAGEL) 1 % gel APPLY DAILY 07/10/15   Leeanne Rio, MD  clozapine (FAZACLO) 100 MG disintegrating tablet Take 400 mg by mouth at bedtime.     Historical Provider, MD  divalproex (DEPAKOTE) 500 MG 24 hr tablet Take 500 mg by mouth 2 (two) times daily at 10 AM and 5 PM.     Historical Provider, MD  DOK 100 MG capsule TAKE ONE CAPSULE EACH DAY 05/07/16   Leeanne Rio, MD  Emollient (CETAPHIL) cream APPLY DAILY 06/12/15   Leeanne Rio, MD  famotidine (PEPCID) 20 MG tablet TAKE ONE TABLET TWICE DAILY 04/02/16   Leeanne Rio, MD  GAS-X EXTRA STRENGTH 125 MG chewable tablet TAKE ONE TABLET DAILY AS NEEDED FOR GAS 06/12/15   Leeanne Rio, MD  glipiZIDE (GLUCOTROL) 10 MG tablet TAKE ONE TABLET TWICE DAILY BEFORE MEALS 07/03/15   Leeanne Rio, MD  glucose blood (ACCU-CHEK AVIVA PLUS) test strip Check blood sugar once daily 07/10/16   Leeanne Rio, MD  JANUVIA 50 MG tablet TAKE ONE TABLET EACH DAY 11/08/15   Leeanne Rio, MD  LORazepam (ATIVAN) 0.5 MG tablet Take 1 tablet (0.5 mg total) by mouth every 8 (eight) hours as needed for anxiety. 01/09/15   Quintella Reichert, MD  medroxyPROGESTERone (DEPO-PROVERA) 150 MG/ML injection Inject 1 mL (150 mg total) into the muscle every 3 (three) months. 01/21/13 08/08/16  Leeanne Rio, MD  metoprolol succinate (TOPROL-XL) 25 MG 24 hr tablet Take 1 tablet (25 mg  total) by mouth daily. 03/18/16   Evans Lance, MD  PARoxetine (PAXIL) 30 MG tablet Take 60 mg by mouth at bedtime.     Historical Provider, MD  polyethylene glycol powder (GLYCOLAX/MIRALAX) powder Take 17 g by mouth at bedtime. 03/31/16   Leeanne Rio, MD  QC CHILDRENS ASPIRIN 81 MG chewable tablet CHEW ONE TABLET DAILY 05/07/16   Leeanne Rio, MD  QC NON-ASPIRIN EXTRA STRENGTH 500 MG tablet ONE TABLET EVERY EIGHT HOURS AS NEEDED FOR MILD PAIN OR FEVER 06/12/15   Leeanne Rio, MD  trimethoprim-polymyxin b (POLYTRIM) ophthalmic solution Place 1 drop into both eyes every 4 (four) hours. 07/16/16   Janne Napoleon, NP   Meds Ordered and Administered this Visit  Medications - No data to display  BP 119/90 (BP Location: Right Arm)   Pulse 114   Temp 99.7 F (37.6 C) (Oral)   Resp 24   SpO2 100%  No data found.   Physical Exam  Constitutional: She appears well-developed and well-nourished. No distress.  HENT:  Head: Normocephalic and atraumatic.  Mouth/Throat: No oropharyngeal exudate.  Bilateral TMs are normal. Oropharynx with minimal erythema and scant clear PND. No swelling or exudates.  Eyes: EOM are normal.  Bilateral conjunctival erythema and moisture around the eyelids.  Neck: Normal range of motion. Neck supple.  Cardiovascular: Normal rate, regular rhythm and intact distal pulses.   Pulmonary/Chest: No respiratory distress.  Able to take a deep breath however patient is unable to cooperate with taking a deep breath and blowing out without making vocal noises. Cannot get a good read on lung sounds however with tidal volume appears to be  poorly identifiable adventitious sounds in the left mid to lower field.  Lymphadenopathy:    She has cervical adenopathy.  Neurological: She is alert.  Skin: Skin is warm and dry.  Nursing note and vitals reviewed.   Urgent Care Course     Procedures (including critical care time)  Labs Review Labs Reviewed  POCT  URINALYSIS DIP (DEVICE) - Abnormal; Notable for the following:       Result Value   Glucose, UA 250 (*)    All other components within normal limits  POCT I-STAT, CHEM 8 - Abnormal; Notable for the following:    Creatinine, Ser 1.30 (*)    Glucose, Bld 256 (*)    All other components within normal limits   Results for orders placed or performed during the hospital encounter of 07/16/16  POCT urinalysis dip (device)  Result Value Ref Range   Glucose, UA 250 (A) NEGATIVE mg/dL   Bilirubin Urine NEGATIVE NEGATIVE   Ketones, ur NEGATIVE NEGATIVE mg/dL   Specific Gravity, Urine <=1.005 1.005 - 1.030   Hgb urine dipstick NEGATIVE NEGATIVE   pH 6.0 5.0 - 8.0   Protein, ur NEGATIVE NEGATIVE mg/dL   Urobilinogen, UA 0.2 0.0 - 1.0  mg/dL   Nitrite NEGATIVE NEGATIVE   Leukocytes, UA NEGATIVE NEGATIVE  I-STAT, chem 8  Result Value Ref Range   Sodium 143 135 - 145 mmol/L   Potassium 4.1 3.5 - 5.1 mmol/L   Chloride 105 101 - 111 mmol/L   BUN 15 6 - 20 mg/dL   Creatinine, Ser 1.30 (H) 0.44 - 1.00 mg/dL   Glucose, Bld 256 (H) 65 - 99 mg/dL   Calcium, Ion 1.22 1.15 - 1.40 mmol/L   TCO2 24 0 - 100 mmol/L   Hemoglobin 12.2 12.0 - 15.0 g/dL   HCT 36.0 36.0 - 46.0 %     Imaging Review Dg Chest 2 View  Result Date: 07/16/2016 CLINICAL DATA:  41 year old female with cough and wheezing EXAM: CHEST  2 VIEW COMPARISON:  Prior chest x-ray 10/12/2013 FINDINGS: Interval development of central airway thickening and peribronchial cuffing which is new compared to June of 2015. No focal airspace consolidation, pleural effusion or pneumothorax. No pulmonary edema. Slightly low inspiratory volumes. No acute osseous abnormality. IMPRESSION: 1. Interval development of central airway thickening and peribronchial cuffing. Imaging findings are most consistent with an acute viral respiratory illness or bronchitis. 2. No focal airspace consolidation to suggest bacterial pneumonia. Electronically Signed   By: Jacqulynn Cadet M.D.   On: 07/16/2016 12:49     Visual Acuity Review  Right Eye Distance:   Left Eye Distance:   Bilateral Distance:    Right Eye Near:   Left Eye Near:    Bilateral Near:         MDM   1. Viral URI with cough   2. Bronchitis   3. Acute conjunctivitis of both eyes, unspecified acute conjunctivitis type    Tylenol for fever or discomfort. Use the Polytrim eyedrops as directed for the thigh infection. Use warm compresses over the eyes to keep them clean and clear of debris. Also recommend using Allegra or Claritin for runny nose and drainage. For any worsening, new symptoms or problems, trouble breathing or increased cough follow-up your primary care doctor or go to the emergency department. Plenty of fluids and stay well-hydrated. Keep check of blood sugars. Meds ordered this encounter  Medications  . trimethoprim-polymyxin b (POLYTRIM) ophthalmic solution    Sig: Place 1 drop into both eyes every 4 (four) hours.    Dispense:  10 mL    Refill:  0    Order Specific Question:   Supervising Provider    Answer:   Robyn Haber [[id number]]       Janne Napoleon, NP 07/16/16 1309

## 2016-07-16 NOTE — ED Triage Notes (Addendum)
Cough   Congested   Runny  Nose   stuffyness  Itchy  Eyes       With  Redness  Present       Symptoms  X  2  Days       Episodes  Of  Falling   Asleep        Pt  States  Her   Glucose   226  Today

## 2016-07-21 ENCOUNTER — Ambulatory Visit (INDEPENDENT_AMBULATORY_CARE_PROVIDER_SITE_OTHER): Payer: Medicaid Other | Admitting: *Deleted

## 2016-07-21 DIAGNOSIS — Z3042 Encounter for surveillance of injectable contraceptive: Secondary | ICD-10-CM

## 2016-07-21 MED ORDER — MEDROXYPROGESTERONE ACETATE 150 MG/ML IM SUSY
150.0000 mg | PREFILLED_SYRINGE | Freq: Once | INTRAMUSCULAR | Status: AC
  Administered 2016-07-21: 150 mg via INTRAMUSCULAR

## 2016-07-21 NOTE — Progress Notes (Signed)
   Patient presents for Depo Provera injection States feeling well Last injection received 04/28/2016  Patient is within dates for injection  Depo Provera given IM LUOQ. Patient tolerated well.  Next injection due May 28-October 20, 2016 Reminder card given  Patient asked for L ear to be examined for ear wax. Small amt of cerumen noted around edges but able to visualize ear drum clearly bilaterally. Velora Heckler, RN

## 2016-07-31 ENCOUNTER — Other Ambulatory Visit: Payer: Self-pay | Admitting: Family Medicine

## 2016-08-15 ENCOUNTER — Other Ambulatory Visit: Payer: Self-pay | Admitting: Family Medicine

## 2016-09-15 ENCOUNTER — Encounter: Payer: Self-pay | Admitting: Family Medicine

## 2016-09-15 ENCOUNTER — Telehealth: Payer: Self-pay | Admitting: *Deleted

## 2016-09-15 ENCOUNTER — Other Ambulatory Visit: Payer: Self-pay | Admitting: *Deleted

## 2016-09-15 ENCOUNTER — Ambulatory Visit (INDEPENDENT_AMBULATORY_CARE_PROVIDER_SITE_OTHER): Payer: Medicaid Other | Admitting: Family Medicine

## 2016-09-15 VITALS — BP 110/70 | HR 115 | Temp 98.7°F | Ht 65.0 in | Wt 167.0 lb

## 2016-09-15 DIAGNOSIS — E1129 Type 2 diabetes mellitus with other diabetic kidney complication: Secondary | ICD-10-CM | POA: Diagnosis not present

## 2016-09-15 DIAGNOSIS — E663 Overweight: Secondary | ICD-10-CM

## 2016-09-15 DIAGNOSIS — Z0000 Encounter for general adult medical examination without abnormal findings: Secondary | ICD-10-CM | POA: Diagnosis not present

## 2016-09-15 LAB — POCT GLYCOSYLATED HEMOGLOBIN (HGB A1C): HEMOGLOBIN A1C: 7.6

## 2016-09-15 NOTE — Progress Notes (Signed)
   Subjective:   [name] is a 41 y.o. female with a history of Chronic constipation, T2 DM, CK D3, schizophrenia, mental retardation here for well woman/preventative visit  Acute Concerns:   T2DM - CBGs consistently in 100s for past 2-3 months   Diet: trying to eat low carb  Exercise: walking at day program most days, also plays dodgeball  Sexual/Birth History: G0, not currently sexually active  Birth Control: depo-provera  POA/Living Will: none   Review of Systems: Per HPI.    PMH, PSH, Medications, Allergies, and FmHx reviewed and updated in EMR.  Social History: never smoker  Immunization:  Tdap/TD: 2013  Influenza: n/a  Pneumococcal: 2015  Herpes Zoster: n/a  Cancer Screening:  Pap Smear: normal, HPV negative in 2015  Mammogram: n/a  Colonoscopy: n/a  Dexa: n/a   Objective:  BP 110/70   Pulse (!) 115   Temp 98.7 F (37.1 C) (Oral)   Ht 5' 5"  (1.651 m)   Wt 167 lb (75.8 kg)   BMI 27.79 kg/m   Gen:  41 y.o. female in NAD HEENT: NCAT, MMM, EOMI, PERRL, anicteric sclerae, TMs clear b/l, OP clear Neck: No LAD, no thyromegaly CV: RRR, no MRG Resp: Non-labored, CTAB, no wheezes noted Abd: Soft, NTND, BS present, no guarding or organomegaly Ext: WWP, no edema MSK: no obvious deformities, strength and gait intact Neuro: Alert and oriented, speech normal  Diabetic Foot Check -  Appearance - no lesions, ulcers or calluses Skin - no unusual pallor or redness Monofilament testing -  Right - Great toe, medial, central, lateral ball and posterior foot intact Left - Great toe, medial, central, lateral ball and posterior foot intact        Chemistry      Component Value Date/Time   NA 145 (H) 09/15/2016 1555   K 4.9 09/15/2016 1555   CL 106 09/15/2016 1555   CO2 18 09/15/2016 1555   BUN 18 09/15/2016 1555   CREATININE 1.61 (H) 09/15/2016 1555   CREATININE 1.79 (H) 06/23/2016 1025      Component Value Date/Time   CALCIUM 9.2 09/15/2016 1555   ALKPHOS 71 05/27/2016 0949   AST 12 05/27/2016 0949   ALT 10 05/27/2016 0949   BILITOT 0.4 05/27/2016 0949      Lab Results  Component Value Date   WBC 7.2 01/09/2015   HGB 12.2 07/16/2016   HCT 36.0 07/16/2016   MCV 90.2 01/09/2015   PLT 151 01/09/2015   Lab Results  Component Value Date   TSH 0.668 04/18/2008   Lab Results  Component Value Date   HGBA1C 7.6 09/15/2016    Assessment & Plan:     [name] is a 41 y.o. female here for annual well woman/preventative exam  Routine health maintenance Up-to-date on cancer screenings and vaccinations Discussed healthy eating habits and daily exercise  Overweight Discussed healthy diet and exercise strategies  Type 2 diabetes mellitus with renal manifestations A1c improving to 7.3 Continue current medications Continue to work on diet and exercise Foot exam completed today Next eye exam in 02/2017 Follow-up in 3 months   [name], Dionne Bucy, MD MPH PGY-3,  [facility] 09/16/2016  3:11 PM

## 2016-09-15 NOTE — Telephone Encounter (Signed)
At appointment for patient today guardian left medical report forms to be filled out, forms placed in PCP box

## 2016-09-15 NOTE — Patient Instructions (Signed)
Nice to meet you today. Your diabetes control is improving. Continue current medications. We are getting some labs today and someone will call or send a letter with the results and they're available.  Follow-up for your diabetes in 3 months with your primary care doctor.  Take care, Dr. B  Things to do to keep yourself healthy  - Exercise at least 30-45 minutes a day, 3-4 days a week.  - Eat a low-fat diet with lots of fruits and vegetables, up to 7-9 servings per day.  - Seatbelts can save your life. Wear them always.  - Smoke detectors on every level of your home, check batteries every year.  - Eye Doctor - have an eye exam every 1-2 years  - Safe sex - if you may be exposed to STDs, use a condom.  - Alcohol -  If you drink, do it moderately, less than 2 drinks per day.  - [place]. Choose someone to speak for you if you are not able.  - Depression is common in our stressful world.If you're feeling down or losing interest in things you normally enjoy, please come in for a visit.  - Violence - If anyone is threatening or hurting you, please call immediately.

## 2016-09-16 LAB — BASIC METABOLIC PANEL
BUN/Creatinine Ratio: 11 (ref 9–23)
BUN: 18 mg/dL (ref 6–24)
CALCIUM: 9.2 mg/dL (ref 8.7–10.2)
CO2: 18 mmol/L (ref 18–29)
CREATININE: 1.61 mg/dL — AB (ref 0.57–1.00)
Chloride: 106 mmol/L (ref 96–106)
GFR calc Af Amer: 45 mL/min/{1.73_m2} — ABNORMAL LOW (ref >59)
GFR, EST NON AFRICAN AMERICAN: 39 mL/min/{1.73_m2} — AB (ref >59)
GLUCOSE: 134 mg/dL — AB (ref 65–99)
Potassium: 4.9 mmol/L (ref 3.5–5.2)
Sodium: 145 mmol/L — ABNORMAL HIGH (ref 134–144)

## 2016-09-16 NOTE — Assessment & Plan Note (Signed)
A1c improving to 7.3 Continue current medications Continue to work on diet and exercise Foot exam completed today Next eye exam in 02/2017 Follow-up in 3 months

## 2016-09-16 NOTE — Assessment & Plan Note (Signed)
Discussed healthy diet and exercise strategies

## 2016-09-16 NOTE — Assessment & Plan Note (Signed)
Up-to-date on cancer screenings and vaccinations Discussed healthy eating habits and daily exercise

## 2016-09-17 ENCOUNTER — Encounter: Payer: Self-pay | Admitting: Family Medicine

## 2016-09-19 ENCOUNTER — Encounter: Payer: Self-pay | Admitting: Family Medicine

## 2016-09-19 MED ORDER — FAMOTIDINE 20 MG PO TABS: 20.0000 mg | ORAL_TABLET | Freq: Two times a day (BID) | ORAL | 11 refills | Status: DC

## 2016-09-19 MED ORDER — METOPROLOL SUCCINATE ER 25 MG PO TB24: 25.0000 mg | ORAL_TABLET | Freq: Every day | ORAL | 3 refills | Status: DC

## 2016-09-19 MED ORDER — CETAPHIL MOISTURIZING EX CREA: TOPICAL_CREAM | Freq: Every day | CUTANEOUS | 3 refills | Status: DC

## 2016-09-19 MED ORDER — ATORVASTATIN CALCIUM 20 MG PO TABS: ORAL_TABLET | ORAL | 3 refills | Status: DC

## 2016-09-19 MED ORDER — GLIPIZIDE 10 MG PO TABS: ORAL_TABLET | ORAL | 1 refills | Status: DC

## 2016-09-19 MED ORDER — SITAGLIPTIN PHOSPHATE 50 MG PO TABS: ORAL_TABLET | ORAL | 11 refills | Status: DC

## 2016-09-19 MED ORDER — FEXOFENADINE HCL 60 MG PO TABS: 60.0000 mg | ORAL_TABLET | Freq: Two times a day (BID) | ORAL | 2 refills | Status: DC

## 2016-09-19 NOTE — Telephone Encounter (Signed)
Form completed. Will return to [employer]. Please scan into chart and call caregiver to advise it is complete Thanks Leeanne Rio, MD

## 2016-10-03 NOTE — Telephone Encounter (Signed)
Spoke with Ms. [place] Crane and she is aware that form is ready and would prefer to go ahead and have it mailed to patient's address.  Copy made and placed to be scanned.  Ellana Kawa,CMA

## 2016-10-07 ENCOUNTER — Ambulatory Visit (INDEPENDENT_AMBULATORY_CARE_PROVIDER_SITE_OTHER): Payer: Medicaid Other | Admitting: *Deleted

## 2016-10-07 DIAGNOSIS — Z3042 Encounter for surveillance of injectable contraceptive: Secondary | ICD-10-CM

## 2016-10-07 MED ORDER — MEDROXYPROGESTERONE ACETATE 150 MG/ML IM SUSY
150.0000 mg | PREFILLED_SYRINGE | Freq: Once | INTRAMUSCULAR | Status: AC
  Administered 2016-10-07: 150 mg via INTRAMUSCULAR

## 2016-10-07 NOTE — Progress Notes (Signed)
Patient here today for Depo Provera injection.  Depo given today in [place].  Site unremarkable & patient tolerated injection.  Next injection due August 14 - August 28.  Reminder card given.  Moksha Dorgan, Salome Spotted, CMA

## 2016-11-18 ENCOUNTER — Other Ambulatory Visit: Payer: Self-pay | Admitting: Family Medicine

## 2016-11-21 ENCOUNTER — Telehealth: Payer: Self-pay | Admitting: Family Medicine

## 2016-11-21 NOTE — Telephone Encounter (Signed)
Patient needs authorization for refills on her topical medication for her acne.  [facility] (who sent a message but has not heard back).  Best number to reach Leda Gauze today is # [phone].

## 2016-11-21 NOTE — Telephone Encounter (Signed)
Prior Authorization received from [place] for clindamycin gel. Formulary and PA form placed in provider box for completion. Derl Barrow, RN

## 2016-11-24 MED ORDER — CLINDAMYCIN PHOSPHATE 1 % EX SWAB: CUTANEOUS | 3 refills | Status: DC

## 2016-11-24 NOTE — Telephone Encounter (Signed)
Contacted care giver, Naida Sleight [phone]) to inform her of clindamycin swabs vs gel. States she will need a script stating clindamycin gel has been discontinued. Would like to pick this up tomorrow. Also requesting test strips be sent to pharmacy. Current rx is for testing once daily. Would like this increased to twice daily as sometimes patient uses multiple strips in AM before it's done correctly. Will forward to PCP. Hubbard Hartshorn, RN, BSN

## 2016-11-24 NOTE — Telephone Encounter (Signed)
It looks like clindamycin pledgets (swabs) are preferred. I've sent this in instead of the gel. Please let caregiver know.  Thanks Leeanne Rio, MD

## 2016-11-25 NOTE — Telephone Encounter (Signed)
I will write a script for discontinuation of clindamycin gel and place at front desk. I cannot do more than once daily testing strips, as patient is not on insulin. Typically insurance only covers once daily testing for patients not on insulin.  Please inform caregiver. Leeanne Rio, MD

## 2016-11-26 NOTE — Telephone Encounter (Signed)
[name] is aware and would like to know if patient can possibly try the new Libra meter.  It has a sensor that is placed on the patient's arm and then the machine can detect the reading from that.  Lattie Haw to please contact the medicaid worker to see if this would even be covered vs out of pocket cost. She will call us back next week and let us know what she finds out. Christianna Belmonte,CMA

## 2016-12-26 ENCOUNTER — Telehealth: Payer: Self-pay | Admitting: Family Medicine

## 2016-12-26 NOTE — Telephone Encounter (Signed)
[employer] form dropped off for at front desk for completion.  Verified that patient section of form has been completed.  Last DOS/WCC with PCP was 09/15/16.  Placed form in team folder to be completed by clinical staff.  Crista Luria

## 2016-12-26 NOTE — Telephone Encounter (Signed)
Forms placed in PCP box for completion.

## 2016-12-30 ENCOUNTER — Telehealth: Payer: Self-pay | Admitting: *Deleted

## 2016-12-30 ENCOUNTER — Ambulatory Visit: Payer: Medicaid Other

## 2016-12-30 NOTE — Telephone Encounter (Signed)
Prior Authorization received from [employer] for Januvia 50 mg. Formulary and PA form placed in provider box for completion. Derl Barrow, RN

## 2016-12-30 NOTE — Telephone Encounter (Signed)
Late entry: [name] left voice message on nurse line 12/29/16 regarding sleep study. Stating if information is needed from legal guardian please call [phone].  Derl Barrow, RN

## 2016-12-31 NOTE — Telephone Encounter (Signed)
Patient's provider Naida Sleight left message on nurse line regarding forms that were dropped off last week. Please give her a call at [phone].  Derl Barrow, RN

## 2017-01-01 NOTE — Telephone Encounter (Signed)
Prior auth form completed, will return to [employer]. Leeanne Rio, MD

## 2017-01-01 NOTE — Telephone Encounter (Signed)
Form completed. Will return to [employer]. Form should be scanned. Of note patient/guardian need to sign the release form authorizing me to give out the info.  Thanks Leeanne Rio, MD

## 2017-01-01 NOTE — Telephone Encounter (Signed)
I have no idea what this message is regarding. I have not ordered a sleep study for patient. Please call family to inquire further what the issue is.  Thanks Leeanne Rio, MD

## 2017-01-02 ENCOUNTER — Ambulatory Visit (INDEPENDENT_AMBULATORY_CARE_PROVIDER_SITE_OTHER): Payer: Medicaid Other | Admitting: *Deleted

## 2017-01-02 DIAGNOSIS — Z3009 Encounter for other general counseling and advice on contraception: Secondary | ICD-10-CM

## 2017-01-02 MED ORDER — MEDROXYPROGESTERONE ACETATE 150 MG/ML IM SUSY
150.0000 mg | PREFILLED_SYRINGE | Freq: Once | INTRAMUSCULAR | Status: AC
  Administered 2017-01-02: 150 mg via INTRAMUSCULAR

## 2017-01-02 NOTE — Telephone Encounter (Signed)
Spoke with patient caregiver, states she knows nothing about patient needing a sleep study and to disregard request.

## 2017-01-02 NOTE — Telephone Encounter (Signed)
Caregiver Ms. Hall informed that [employer] forms were completed and ready for pickup.  Forms copied for scanning in chart. Derl Barrow, RN

## 2017-01-02 NOTE — Telephone Encounter (Signed)
Received PA approval for Januvia via [place] [employer].  Med approved for 01/01/17 - 01/01/18.  [facility] pharmacy informed.  PA approval number [id number]. Derl Barrow, RN

## 2017-01-02 NOTE — Progress Notes (Signed)
Patient here today for Depo Provera injection.  Depo given today in right upper out quadrant.  Site unremarkable & patient tolerated injection.  Next injection due November 9 - November 23.  Reminder card given.

## 2017-03-31 ENCOUNTER — Ambulatory Visit (INDEPENDENT_AMBULATORY_CARE_PROVIDER_SITE_OTHER): Payer: Medicaid Other | Admitting: *Deleted

## 2017-03-31 DIAGNOSIS — Z23 Encounter for immunization: Secondary | ICD-10-CM

## 2017-03-31 DIAGNOSIS — Z3009 Encounter for other general counseling and advice on contraception: Secondary | ICD-10-CM | POA: Diagnosis not present

## 2017-03-31 MED ORDER — MEDROXYPROGESTERONE ACETATE 150 MG/ML IM SUSY
150.0000 mg | PREFILLED_SYRINGE | Freq: Once | INTRAMUSCULAR | Status: AC
  Administered 2017-03-31: 150 mg via INTRAMUSCULAR

## 2017-03-31 NOTE — Progress Notes (Signed)
Patient here today for Depo Provera injection.  Depo given today in left upper outer quadrant.  Site unremarkable & patient tolerated injection.  Next injection due February 5 - February 19.  Reminder card given.

## 2017-04-08 ENCOUNTER — Encounter: Payer: Self-pay | Admitting: Internal Medicine

## 2017-04-10 LAB — HM DIABETES EYE EXAM

## 2017-05-07 ENCOUNTER — Telehealth: Payer: Self-pay | Admitting: *Deleted

## 2017-05-07 NOTE — Telephone Encounter (Signed)
Leda Gauze needs a new written order for the following  1. That pt can take meds by herself with supervision  2. Clarification on when miralax should be taken.  They have documentation for up to 2 x daily and 3x daily.  Leda Gauze will pick up front office when complete. .memed

## 2017-05-15 NOTE — Telephone Encounter (Signed)
I am covering for [name].  From the last script it looks like just once at bedtime.  Unclear if this is correct or not.  Will defer to [name].  For now, please give verbal order for once at bedtime.  Can increase when she is back next week.

## 2017-05-19 NOTE — Telephone Encounter (Signed)
Rx written stating miralax up to twice daily as needed, and patient may take medications by herself with supervision, will place at front desk. Please let caregiver know. Please also have them schedule a follow up visit with me. Patient is past due for diabetes follow up, which should be every 3 months.  Thanks, Leeanne Rio, MD

## 2017-05-19 NOTE — Telephone Encounter (Signed)
Leda Gauze informed and pt has appt scheduled. Deseree Kennon Holter, CMA

## 2017-05-27 ENCOUNTER — Ambulatory Visit: Payer: Self-pay | Admitting: Internal Medicine

## 2017-06-05 ENCOUNTER — Encounter: Payer: Self-pay | Admitting: Family Medicine

## 2017-06-05 ENCOUNTER — Other Ambulatory Visit: Payer: Self-pay

## 2017-06-05 ENCOUNTER — Ambulatory Visit: Payer: Medicare Other | Admitting: Family Medicine

## 2017-06-05 VITALS — BP 112/68 | HR 94 | Temp 97.9°F | Ht 65.0 in | Wt 165.2 lb

## 2017-06-05 DIAGNOSIS — E1129 Type 2 diabetes mellitus with other diabetic kidney complication: Secondary | ICD-10-CM

## 2017-06-05 DIAGNOSIS — K5909 Other constipation: Secondary | ICD-10-CM

## 2017-06-05 DIAGNOSIS — L700 Acne vulgaris: Secondary | ICD-10-CM

## 2017-06-05 LAB — POCT GLYCOSYLATED HEMOGLOBIN (HGB A1C): Hemoglobin A1C: 9.5

## 2017-06-05 MED ORDER — CLINDAMYCIN PHOSPHATE 1 % EX SWAB: CUTANEOUS | 3 refills | Status: DC

## 2017-06-05 NOTE — Patient Instructions (Addendum)
Refilled clindamycin  For weight management: Call Dr. Jenne Campus (our nutritionist) to set up an appointment. Her phone number is: [phone].   On your way out, schedule an appointment one morning to come back for fasting labs. Do not eat or drink anything other than water the morning of your lab appointment until after your labs are drawn.   Follow up with me in 3 months.  Be well, [name]

## 2017-06-05 NOTE — Progress Notes (Signed)
Date of Visit: 06/05/2017   HPI:  Patient presents for routine follow up.  Diabetes - currently taking glipizide 23m twice daily and januvia 544mdaily. Tolerating these well. Checks sugars daily but sometimes uses multiple strips, causing her to run out early. Fasting sugars are running in 200s. Her group home facilitator is present today and reports they are having a lot of difficulty with getting Felicia to eat healthy. She gets chips every day in her lunch. No chest pain or shortness of breath. Willing to meet with nutritionist.  Constipation - takes colace 10076maily and miralax 17g daily. Tolerating well. Still occasional constipation but overall doing well.   Acne - using clindamycin facial solution with good results. Wants to stay on this medication.   Hyperlipidemia - taking atorvastatin 50m51mily. Not fasting today.   ROS: See HPI.  [place] of type 2 diabetes, overweight, chronic constipation, stage 3 ckd, SVT, acne, schizophrenia, cognitive delay  PHYSICAL EXAM: BP 112/68   Pulse 94   Temp 97.9 F (36.6 C) (Oral)   Ht 5' 5"  (1.651 m)   Wt 165 lb 3.2 oz (74.9 kg)   SpO2 96%   BMI 27.49 kg/m  Gen: no acute distress, pleasant, cooperative, well appearing HEENT: normocephalic, atraumatic, moist mucous membranes  Heart: regular rate and rhythm, no murmur Lungs: clear to auscultation bilaterally, normal work of breathing  Neuro: alert, grossly nonfocal, speech at baseline Ext: No appreciable lower extremity edema bilaterally   ASSESSMENT/PLAN:  Health maintenance:  -will request records from eye exam (had several months ago at Dr. WhitGertie Exonice)  Type 2 diabetes mellitus with renal manifestations Uncontrolled with A1c of 9.5. Due to her cognitive/psych issues I do not think patient would do well with injectable medications and our goal has been to keep her off of them as long as possible. Discussed with patient and group home representative that patient  must make dietary changes to control diabetes or she may need injectable therapy. Given phone # for Dr. SykeJenne Campusschedule a nutrition appointment. Follow up in 3 months to recheck A1c. Of note also recommended that group home staff supervise blood sugar checks so that patient does not use too many strips & does not run out early.  Will get records for eye exam Return for fasting lipids & CMET.   CONSTIPATION, CHRONIC Stable on present regimen, continue.  Acne Stable on clindamycin. Offered trial off this medication but patient and caregiver prefer to continue. Refilled.   FOLLOW UP: Follow up in 3 mos for above issues Schedule nutritionist appointment   [place]Ardelia Mems [place]

## 2017-06-08 NOTE — Assessment & Plan Note (Addendum)
Uncontrolled with A1c of 9.5. Due to her cognitive/psych issues I do not think patient would do well with injectable medications and our goal has been to keep her off of them as long as possible. Discussed with patient and group home representative that patient must make dietary changes to control diabetes or she may need injectable therapy. Given phone # for Dr. Jenne Campus to schedule a nutrition appointment. Follow up in 3 months to recheck A1c. Of note also recommended that group home staff supervise blood sugar checks so that patient does not use too many strips & does not run out early.  Will get records for eye exam Return for fasting lipids & CMET.

## 2017-06-08 NOTE — Assessment & Plan Note (Signed)
Stable on clindamycin. Offered trial off this medication but patient and caregiver prefer to continue. Refilled.

## 2017-06-08 NOTE — Assessment & Plan Note (Signed)
Stable on present regimen, continue.

## 2017-06-09 ENCOUNTER — Other Ambulatory Visit: Payer: Medicaid Other

## 2017-06-10 ENCOUNTER — Other Ambulatory Visit: Payer: Medicaid Other

## 2017-06-10 DIAGNOSIS — E1129 Type 2 diabetes mellitus with other diabetic kidney complication: Secondary | ICD-10-CM

## 2017-06-11 LAB — LIPID PANEL
CHOL/HDL RATIO: 3.2 ratio (ref 0.0–4.4)
Cholesterol, Total: 87 mg/dL — ABNORMAL LOW (ref 100–199)
HDL: 27 mg/dL — AB (ref >39)
LDL Calculated: 39 mg/dL (ref 0–99)
Triglycerides: 105 mg/dL (ref 0–149)
VLDL Cholesterol Cal: 21 mg/dL (ref 5–40)

## 2017-06-11 LAB — CMP14+EGFR
ALBUMIN: 3.8 g/dL (ref 3.5–5.5)
ALK PHOS: 85 IU/L (ref 39–117)
ALT: 12 IU/L (ref 0–32)
AST: 12 IU/L (ref 0–40)
Albumin/Globulin Ratio: 1.3 (ref 1.2–2.2)
BILIRUBIN TOTAL: 0.2 mg/dL (ref 0.0–1.2)
BUN/Creatinine Ratio: 11 (ref 9–23)
BUN: 17 mg/dL (ref 6–24)
CHLORIDE: 109 mmol/L — AB (ref 96–106)
CO2: 24 mmol/L (ref 20–29)
CREATININE: 1.55 mg/dL — AB (ref 0.57–1.00)
Calcium: 9.3 mg/dL (ref 8.7–10.2)
GFR calc Af Amer: 47 mL/min/{1.73_m2} — ABNORMAL LOW (ref >59)
GFR calc non Af Amer: 41 mL/min/{1.73_m2} — ABNORMAL LOW (ref >59)
GLUCOSE: 191 mg/dL — AB (ref 65–99)
Globulin, Total: 3 g/dL (ref 1.5–4.5)
Potassium: 4.6 mmol/L (ref 3.5–5.2)
Sodium: 146 mmol/L — ABNORMAL HIGH (ref 134–144)
Total Protein: 6.8 g/dL (ref 6.0–8.5)

## 2017-06-29 ENCOUNTER — Ambulatory Visit: Payer: Medicaid Other | Admitting: Family Medicine

## 2017-06-29 VITALS — BP 110/70 | HR 99 | Temp 98.1°F | Wt 166.0 lb

## 2017-06-29 DIAGNOSIS — R8290 Unspecified abnormal findings in urine: Secondary | ICD-10-CM | POA: Diagnosis present

## 2017-06-29 DIAGNOSIS — Z3042 Encounter for surveillance of injectable contraceptive: Secondary | ICD-10-CM

## 2017-06-29 LAB — POCT URINALYSIS DIP (MANUAL ENTRY)
BILIRUBIN UA: NEGATIVE
BILIRUBIN UA: NEGATIVE mg/dL
Glucose, UA: NEGATIVE mg/dL
NITRITE UA: NEGATIVE
PH UA: 6 (ref 5.0–8.0)
Protein Ur, POC: NEGATIVE mg/dL
RBC UA: NEGATIVE
Urobilinogen, UA: 0.2 E.U./dL

## 2017-06-29 LAB — POCT UA - MICROSCOPIC ONLY

## 2017-06-29 MED ORDER — MEDROXYPROGESTERONE ACETATE 150 MG/ML IM SUSP
150.0000 mg | Freq: Once | INTRAMUSCULAR | Status: AC
  Administered 2017-06-29: 150 mg via INTRAMUSCULAR

## 2017-06-29 NOTE — Progress Notes (Signed)
   Subjective:   Patient ID: [name]    DOB: August 24, 1975, 42 y.o. female   MRN: [id number]  CC: odor in urine, Depo-Provera injection  HPI: [name] is a 42 y.o. female who presents to clinic today for the following issues.  Odor in urine Presents with her caregiver for today's visit.  Her caregiver states that she sees her almost daily.  Patient lives in a group home.  She has noticed that her urine has been smelling foul for the past 1 week.  She denies burning with urination.  She denies urinary frequency and urgency.   Denies recent episodes of nighttime incontinence.  She drinks at least two 10 ounce glasses of water in the morning and with dinner.   Contraception management -Depo-Provera injection today  ROS: Denies fevers, chills, nausea, vomiting, abdominal pain.  Denies urinary symptoms.  Social: Patient is a never smoker  Medications reviewed. Objective:   BP 110/70   Pulse 99   Temp 98.1 F (36.7 C) (Oral)   Wt 166 lb (75.3 kg)   SpO2 99%   BMI 27.62 kg/m  Vitals and nursing note reviewed.  General: 42 year old female, NAD HEENT: NCAT, EOMI, MMM, oropharynx is clear Neck: supple, normal range of motion CV: RRR no MRG Lungs: CTA B, normal effort Abdomen: soft, NT ND, positive bowel sounds Skin: warm, dry, no rash Extremities: warm and well perfused, normal tone  Assessment & Plan:   Odor in urine -UA noninfectious with small leuks, negative for nitrite.  -low spec gravity  -Discussed with pt and caretaker importance of hydration  -follow up prn   Contraception management -Depo-Provera today -Next injection May 6-May 20; reminder card provided to patient and caretaker  No problem-specific Assessment & Plan notes found for this encounter.  Orders Placed This Encounter  Procedures  . POCT urinalysis dipstick  . POCT UA - Microscopic Only   Meds ordered this encounter  Medications  . medroxyPROGESTERone (DEPO-PROVERA) injection 150 mg    Lovenia Kim, MD [place], PGY-2 07/04/2017 12:49 PM

## 2017-06-29 NOTE — Progress Notes (Signed)
Patient presented today for a depo injection in the RUQ and tolerated it well. Patient was given a reminder card to return for next injection May 6-May 20.Ozella Almond, CMA

## 2017-06-29 NOTE — Patient Instructions (Signed)
You were seen in clinic today for odor in your urine.  As we discussed, this can sometimes happen when you do not drink enough water.  We checked your urine and there are no signs of an infection.  You develop any burning with urination, increased in frequency or urgency, I would like for you to come back in to be rechecked.  Please call clinic if you have any questions.  Be well, Lovenia Kim MD

## 2017-07-10 ENCOUNTER — Encounter: Payer: Self-pay | Admitting: Family Medicine

## 2017-08-03 ENCOUNTER — Encounter: Payer: Self-pay | Admitting: Family Medicine

## 2017-08-04 ENCOUNTER — Other Ambulatory Visit: Payer: Self-pay | Admitting: Family Medicine

## 2017-09-03 ENCOUNTER — Other Ambulatory Visit: Payer: Self-pay | Admitting: Family Medicine

## 2017-09-03 NOTE — Telephone Encounter (Signed)
Please ask caregiver to schedule patient visit with me, thanks! Leeanne Rio, MD

## 2017-09-04 NOTE — Telephone Encounter (Signed)
Caregiver informed and appt made for 10/06/17. [name], [name], CMA

## 2017-09-23 ENCOUNTER — Other Ambulatory Visit: Payer: Self-pay | Admitting: Family Medicine

## 2017-10-01 ENCOUNTER — Other Ambulatory Visit: Payer: Self-pay | Admitting: Family Medicine

## 2017-10-06 ENCOUNTER — Ambulatory Visit (HOSPITAL_COMMUNITY)
Admission: RE | Admit: 2017-10-06 | Discharge: 2017-10-06 | Disposition: A | Discharge status: Home / Self Care | Payer: Medicaid Other | Source: Ambulatory Visit | Attending: Family Medicine | Admitting: Family Medicine

## 2017-10-06 ENCOUNTER — Ambulatory Visit: Payer: Medicaid Other | Admitting: Family Medicine

## 2017-10-06 ENCOUNTER — Other Ambulatory Visit: Payer: Self-pay

## 2017-10-06 VITALS — BP 115/70 | HR 92 | Temp 98.5°F | Ht 65.0 in | Wt 165.8 lb

## 2017-10-06 DIAGNOSIS — R0789 Other chest pain: Secondary | ICD-10-CM | POA: Insufficient documentation

## 2017-10-06 DIAGNOSIS — Z3042 Encounter for surveillance of injectable contraceptive: Secondary | ICD-10-CM

## 2017-10-06 DIAGNOSIS — E1129 Type 2 diabetes mellitus with other diabetic kidney complication: Secondary | ICD-10-CM

## 2017-10-06 DIAGNOSIS — K5909 Other constipation: Secondary | ICD-10-CM | POA: Insufficient documentation

## 2017-10-06 LAB — POCT GLYCOSYLATED HEMOGLOBIN (HGB A1C): HbA1c, POC (controlled diabetic range): 7.7 % — AB (ref 0.0–7.0)

## 2017-10-06 NOTE — Progress Notes (Signed)
ekgDate of Visit: 10/06/2017   HPI:  Patient presents for routine follow up of diabetes.  Diabetes - A1c 7.7 today. Taking glipizide 57m twice daily and januvia 521mdaily. Caregivers have been working with her on eating healthier.   Constipation - bowels moving well on miralax and colace.  Contraception - past due for depo shot today. Unable to give urine sample in office to rule out pregnancy. Patient denies being sexually active.  Chest heaviness - endorsed on ROS having occasional heavy chest. Patient is unable to give any specifics about chest heaviness (ie, how often, for how long, associated symptoms, last time it happened, etc). Has not told caregivers about these symptoms at all in the past.  ROS: See HPI.  PMFultonhamhistory of type 2 diabetes, overweight, mental retardation, schizophrenia, chronic constipation, CKD3, acne, SVT  PHYSICAL EXAM: BP 115/70 (BP Location: Left Arm, Patient Position: Sitting, Cuff Size: Normal)   Pulse 92   Temp 98.5 F (36.9 C) (Oral)   Ht 5' 5"  (1.651 m)   Wt 165 lb 12.8 oz (75.2 kg)   SpO2 100%   BMI 27.59 kg/m  Gen: no acute distress, pleasant, cooperative HEENT: normocephalic, atraumatic, moist mucous membranes  Heart: regular rate and rhythm, no murmur Lungs: clear to auscultation bilaterally, normal work of breathing  Neuro: alert, grossly nonfocal, speech normal Ext: No appreciable lower extremity edema bilaterally   ASSESSMENT/PLAN:  Health maintenance:  -due for HIV screening, will check with next lab draw  Type 2 diabetes mellitus with renal manifestations (HCC) A1c much improved today to 7.7. Congratulated patient and caregiver on this success. Continue current efforts & medications, follow up in 3 months.   Chest discomfort Endorsed on review of systems but patient unable to give any further specifics. EKG obtained today and is unchanged from prior. Discussed with patient the importance of telling her caregivers when she has  this discomfort. Reviewed chest pain return precautions.  CONSTIPATION, CHRONIC Doing well on colace and miralax, continue current regimen.  Contraception management Patient late for depo shot, and unable to give urine sample during today's visit. Advised scheduling nurse visit to give urine for upreg and getting depo shot.  FOLLOW UP: Follow up in 3 mos for diabetes   Br[place]ania. McArdelia MemsMDSacred Heart

## 2017-10-06 NOTE — Patient Instructions (Addendum)
It was great to see you again today!  Congratulations on the A1c. Stay on current medications.  EKG looks unchanged from before If chest pain recurs please make note of the details If chest pain comes on and does not go away with rest please go to ED.  Follow up with me in 3 months.  Be well, [name]

## 2017-10-09 ENCOUNTER — Other Ambulatory Visit: Payer: Self-pay | Admitting: Family Medicine

## 2017-10-11 DIAGNOSIS — Z309 Encounter for contraceptive management, unspecified: Secondary | ICD-10-CM | POA: Insufficient documentation

## 2017-10-11 NOTE — Assessment & Plan Note (Signed)
A1c much improved today to 7.7. Congratulated patient and caregiver on this success. Continue current efforts & medications, follow up in 3 months.

## 2017-10-11 NOTE — Assessment & Plan Note (Signed)
Doing well on colace and miralax, continue current regimen.

## 2017-10-11 NOTE — Assessment & Plan Note (Signed)
Patient late for depo shot, and unable to give urine sample during today's visit. Advised scheduling nurse visit to give urine for upreg and getting depo shot.

## 2017-10-11 NOTE — Assessment & Plan Note (Signed)
Endorsed on review of systems but patient unable to give any further specifics. EKG obtained today and is unchanged from prior. Discussed with patient the importance of telling her caregivers when she has this discomfort. Reviewed chest pain return precautions.

## 2017-11-10 ENCOUNTER — Other Ambulatory Visit: Payer: Self-pay | Admitting: Family Medicine

## 2017-11-10 DIAGNOSIS — F250 Schizoaffective disorder, bipolar type: Secondary | ICD-10-CM | POA: Diagnosis not present

## 2017-11-10 DIAGNOSIS — F70 Mild intellectual disabilities: Secondary | ICD-10-CM | POA: Diagnosis not present

## 2017-11-10 DIAGNOSIS — Z79899 Other long term (current) drug therapy: Secondary | ICD-10-CM | POA: Diagnosis not present

## 2017-12-08 ENCOUNTER — Encounter: Payer: Medicaid Other | Admitting: Family Medicine

## 2017-12-15 ENCOUNTER — Other Ambulatory Visit: Payer: Self-pay

## 2017-12-15 ENCOUNTER — Ambulatory Visit (INDEPENDENT_AMBULATORY_CARE_PROVIDER_SITE_OTHER): Payer: Medicaid Other | Admitting: Family Medicine

## 2017-12-15 ENCOUNTER — Encounter: Payer: Self-pay | Admitting: Family Medicine

## 2017-12-15 VITALS — BP 108/68 | HR 94 | Temp 98.1°F | Ht 65.0 in | Wt 163.8 lb

## 2017-12-15 DIAGNOSIS — Z3042 Encounter for surveillance of injectable contraceptive: Secondary | ICD-10-CM | POA: Diagnosis not present

## 2017-12-15 DIAGNOSIS — Z0000 Encounter for general adult medical examination without abnormal findings: Secondary | ICD-10-CM | POA: Diagnosis not present

## 2017-12-15 DIAGNOSIS — E1129 Type 2 diabetes mellitus with other diabetic kidney complication: Secondary | ICD-10-CM | POA: Diagnosis not present

## 2017-12-15 LAB — POCT URINE PREGNANCY: PREG TEST UR: NEGATIVE

## 2017-12-15 MED ORDER — MEDROXYPROGESTERONE ACETATE 150 MG/ML IM SUSY
150.0000 mg | PREFILLED_SYRINGE | Freq: Once | INTRAMUSCULAR | Status: AC
  Administered 2017-12-15: 150 mg via INTRAMUSCULAR

## 2017-12-15 NOTE — Patient Instructions (Addendum)
Follow up in 1 month for next visit for diabetes - see any doctor here since I'll be out of the office.  Completed forms today.  Follow up with me in 4 months.  Be well, [name]   Health Maintenance, Female Adopting a healthy lifestyle and getting preventive care can go a long way to promote health and wellness. Talk with your health care provider about what schedule of regular examinations is right for you. This is a good chance for you to check in with your provider about disease prevention and staying healthy. In between checkups, there are plenty of things you can do on your own. Experts have done a lot of research about which lifestyle changes and preventive measures are most likely to keep you healthy. Ask your health care provider for more information. Weight and diet Eat a healthy diet  Be sure to include plenty of vegetables, fruits, low-fat dairy products, and lean protein.  Do not eat a lot of foods high in solid fats, added sugars, or salt.  Get regular exercise. This is one of the most important things you can do for your health. ? Most adults should exercise for at least 150 minutes each week. The exercise should increase your heart rate and make you sweat (moderate-intensity exercise). ? Most adults should also do strengthening exercises at least twice a week. This is in addition to the moderate-intensity exercise.  Maintain a healthy weight  Body mass index (BMI) is a measurement that can be used to identify possible weight problems. It estimates body fat based on height and weight. Your health care provider can help determine your BMI and help you achieve or maintain a healthy weight.  For females 3 years of age and older: ? A BMI below 18.5 is considered underweight. ? A BMI of 18.5 to 24.9 is normal. ? A BMI of 25 to 29.9 is considered overweight. ? A BMI of 30 and above is considered obese.  Watch levels of cholesterol and blood lipids  You should start  having your blood tested for lipids and cholesterol at 44 years of age, then have this test every 5 years.  You may need to have your cholesterol levels checked more often if: ? Your lipid or cholesterol levels are high. ? You are older than 44 years of age. ? You are at high risk for heart disease.  Cancer screening Lung Cancer  Lung cancer screening is recommended for adults 68-72 years old who are at high risk for lung cancer because of a history of smoking.  A yearly low-dose CT scan of the lungs is recommended for people who: ? Currently smoke. ? Have quit within the past 15 years. ? Have at least a 30-pack-year history of smoking. A pack year is smoking an average of one pack of cigarettes a day for 1 year.  Yearly screening should continue until it has been 15 years since you quit.  Yearly screening should stop if you develop a health problem that would prevent you from having lung cancer treatment.  Breast Cancer  Practice breast self-awareness. This means understanding how your breasts normally appear and feel.  It also means doing regular breast self-exams. Let your health care provider know about any changes, no matter how small.  If you are in your 20s or 30s, you should have a clinical breast exam (CBE) by a health care provider every 1-3 years as part of a regular health exam.  If you are 40 or  older, have a CBE every year. Also consider having a breast X-ray (mammogram) every year.  If you have a family history of breast cancer, talk to your health care provider about genetic screening.  If you are at high risk for breast cancer, talk to your health care provider about having an MRI and a mammogram every year.  Breast cancer gene (BRCA) assessment is recommended for women who have family members with BRCA-related cancers. BRCA-related cancers include: ? Breast. ? Ovarian. ? Tubal. ? Peritoneal cancers.  Results of the assessment will determine the need for  genetic counseling and BRCA1 and BRCA2 testing.  Cervical Cancer Your health care provider may recommend that you be screened regularly for cancer of the pelvic organs (ovaries, uterus, and vagina). This screening involves a pelvic examination, including checking for microscopic changes to the surface of your cervix (Pap test). You may be encouraged to have this screening done every 3 years, beginning at age 41.  For women ages 46-65, health care providers may recommend pelvic exams and Pap testing every 3 years, or they may recommend the Pap and pelvic exam, combined with testing for human papilloma virus (HPV), every 5 years. Some types of HPV increase your risk of cervical cancer. Testing for HPV may also be done on women of any age with unclear Pap test results.  Other health care providers may not recommend any screening for nonpregnant women who are considered low risk for pelvic cancer and who do not have symptoms. Ask your health care provider if a screening pelvic exam is right for you.  If you have had past treatment for cervical cancer or a condition that could lead to cancer, you need Pap tests and screening for cancer for at least 20 years after your treatment. If Pap tests have been discontinued, your risk factors (such as having a new sexual partner) need to be reassessed to determine if screening should resume. Some women have medical problems that increase the chance of getting cervical cancer. In these cases, your health care provider may recommend more frequent screening and Pap tests.  Colorectal Cancer  This type of cancer can be detected and often prevented.  Routine colorectal cancer screening usually begins at 44 years of age and continues through 44 years of age.  Your health care provider may recommend screening at an earlier age if you have risk factors for colon cancer.  Your health care provider may also recommend using home test kits to check for hidden blood in the  stool.  A small camera at the end of a tube can be used to examine your colon directly (sigmoidoscopy or colonoscopy). This is done to check for the earliest forms of colorectal cancer.  Routine screening usually begins at age 98.  Direct examination of the colon should be repeated every 5-10 years through 44 years of age. However, you may need to be screened more often if early forms of precancerous polyps or small growths are found.  Skin Cancer  Check your skin from head to toe regularly.  Tell your health care provider about any new moles or changes in moles, especially if there is a change in a mole's shape or color.  Also tell your health care provider if you have a mole that is larger than the size of a pencil eraser.  Always use sunscreen. Apply sunscreen liberally and repeatedly throughout the day.  Protect yourself by wearing long sleeves, pants, a wide-brimmed hat, and sunglasses whenever you are outside.  Heart disease, diabetes, and high blood pressure  High blood pressure causes heart disease and increases the risk of stroke. High blood pressure is more likely to develop in: ? People who have blood pressure in the high end of the normal range (130-139/85-89 mm Hg). ? People who are overweight or obese. ? People who are African American.  If you are 28-75 years of age, have your blood pressure checked every 3-5 years. If you are 61 years of age or older, have your blood pressure checked every year. You should have your blood pressure measured twice-once when you are at a hospital or clinic, and once when you are not at a hospital or clinic. Record the average of the two measurements. To check your blood pressure when you are not at a hospital or clinic, you can use: ? An automated blood pressure machine at a pharmacy. ? A home blood pressure monitor.  If you are between 45 years and 3 years old, ask your health care provider if you should take aspirin to prevent  strokes.  Have regular diabetes screenings. This involves taking a blood sample to check your fasting blood sugar level. ? If you are at a normal weight and have a low risk for diabetes, have this test once every three years after 44 years of age. ? If you are overweight and have a high risk for diabetes, consider being tested at a younger age or more often. Preventing infection Hepatitis B  If you have a higher risk for hepatitis B, you should be screened for this virus. You are considered at high risk for hepatitis B if: ? You were born in a country where hepatitis B is common. Ask your health care provider which countries are considered high risk. ? Your parents were born in a high-risk country, and you have not been immunized against hepatitis B (hepatitis B vaccine). ? You have HIV or AIDS. ? You use needles to inject street drugs. ? You live with someone who has hepatitis B. ? You have had sex with someone who has hepatitis B. ? You get hemodialysis treatment. ? You take certain medicines for conditions, including cancer, organ transplantation, and autoimmune conditions.  Hepatitis C  Blood testing is recommended for: ? Everyone born from [birth year] through 1967. ? Anyone with known risk factors for hepatitis C.  Sexually transmitted infections (STIs)  You should be screened for sexually transmitted infections (STIs) including gonorrhea and chlamydia if: ? You are sexually active and are younger than 44 years of age. ? You are older than 44 years of age and your health care provider tells you that you are at risk for this type of infection. ? Your sexual activity has changed since you were last screened and you are at an increased risk for chlamydia or gonorrhea. Ask your health care provider if you are at risk.  If you do not have HIV, but are at risk, it may be recommended that you take a prescription medicine daily to prevent HIV infection. This is called pre-exposure prophylaxis  (PrEP). You are considered at risk if: ? You are sexually active and do not regularly use condoms or know the HIV status of your partner(s). ? You take drugs by injection. ? You are sexually active with a partner who has HIV.  Talk with your health care provider about whether you are at high risk of being infected with HIV. If you choose to begin PrEP, you should first be tested for HIV.  You should then be tested every 3 months for as long as you are taking PrEP. Pregnancy  If you are premenopausal and you may become pregnant, ask your health care provider about preconception counseling.  If you may become pregnant, take 400 to 800 micrograms (mcg) of folic acid every day.  If you want to prevent pregnancy, talk to your health care provider about birth control (contraception). Osteoporosis and menopause  Osteoporosis is a disease in which the bones lose minerals and strength with aging. This can result in serious bone fractures. Your risk for osteoporosis can be identified using a bone density scan.  If you are 13 years of age or older, or if you are at risk for osteoporosis and fractures, ask your health care provider if you should be screened.  Ask your health care provider whether you should take a calcium or vitamin D supplement to lower your risk for osteoporosis.  Menopause may have certain physical symptoms and risks.  Hormone replacement therapy may reduce some of these symptoms and risks. Talk to your health care provider about whether hormone replacement therapy is right for you. Follow these instructions at home:  Schedule regular health, dental, and eye exams.  Stay current with your immunizations.  Do not use any tobacco products including cigarettes, chewing tobacco, or electronic cigarettes.  If you are pregnant, do not drink alcohol.  If you are breastfeeding, limit how much and how often you drink alcohol.  Limit alcohol intake to no more than 1 drink per day for  nonpregnant women. One drink equals 12 ounces of beer, 5 ounces of wine, or 1 ounces of hard liquor.  Do not use street drugs.  Do not share needles.  Ask your health care provider for help if you need support or information about quitting drugs.  Tell your health care provider if you often feel depressed.  Tell your health care provider if you have ever been abused or do not feel safe at home. This information is not intended to replace advice given to you by your health care provider. Make sure you discuss any questions you have with your health care provider. Document Released: 11/11/2010 Document Revised: 10/04/2015 Document Reviewed: 01/30/2015 Elsevier Interactive Patient Education  [employer].

## 2017-12-15 NOTE — Addendum Note (Signed)
Addended by: Junious Dresser on: 12/15/2017 10:19 AM   Modules accepted: Orders

## 2017-12-15 NOTE — Progress Notes (Signed)
Date of Visit: 12/15/2017   HPI:  Patient presents along with her caregiver for a physical in order to complete forms for the group home. Needs FL2 completed as well.  No concerns today identified at beginning of visit. Caregiver would like for patient to have order to no longer self-medicate, as she has noticed that patient administers multiple of the same pills at one time.  For diabetes currently taking januvia and glipizide. Fasting sugars run mostly 120-180s. They are pleased with this. Would like referral to nutritionist. Not due yet for A1c.  Contraception - past due for depo today. Has 24 hour supervision so caregiver is certain she is not sexually active.  ROS: See HPI.  [place]: history of type 2 diabetes, constipation, schizophrenia, mental retardation, CKD3, acne  PHYSICAL EXAM: BP 108/68   Pulse 94   Temp 98.1 F (36.7 C) (Oral)   Ht 5' 5"  (1.651 m)   Wt 163 lb 12.8 oz (74.3 kg)   SpO2 97%   BMI 27.26 kg/m  Gen: no acute distress, pleasant, cooperative HEENT: normocephalic, atraumatic, moist mucous membranes. Good dentition. No anterior cervical or supraclavicular lymphadenopathy.  Heart: regular rate and rhythm, no murmur Lungs: clear to auscultation bilaterally, normal work of breathing  Abdomen; soft, nontender to palpation, no masses Neuro: alert, grossly nonfocal, speech normal Ext: No appreciable lower extremity edema bilaterally  Skin: no rashes noted  ASSESSMENT/PLAN:  Form completion physical:  -filled out forms for FL2, standing orders, and group home participation physical -handout given on health maintenance in female  Type 2 diabetes mellitus with renal manifestations ([place]) Not yet due for A1c. Patient will follow up in 1 month for A1c with any provider here since I will be on maternity leave. Sugars reported are encouraging. Refer to nutritionist per patient/caregiver request.  Contraception management Past due for depo. Urine preg neg today. Depo  given.  FOLLOW UP: Follow up in 54mofor diabetes/A1c, then see me in 4 months   [place]. MArdelia Mems [place]

## 2017-12-15 NOTE — Assessment & Plan Note (Signed)
Past due for depo. Urine preg neg today. Depo given.

## 2017-12-15 NOTE — Assessment & Plan Note (Signed)
Not yet due for A1c. Patient will follow up in 1 month for A1c with any provider here since I will be on maternity leave. Sugars reported are encouraging. Refer to nutritionist per patient/caregiver request.

## 2018-01-08 ENCOUNTER — Other Ambulatory Visit: Payer: Self-pay | Admitting: Family Medicine

## 2018-01-15 ENCOUNTER — Other Ambulatory Visit: Payer: Self-pay | Admitting: Family Medicine

## 2018-01-15 ENCOUNTER — Telehealth: Payer: Self-pay

## 2018-01-15 ENCOUNTER — Ambulatory Visit: Payer: Medicaid Other | Admitting: Family Medicine

## 2018-01-15 NOTE — Telephone Encounter (Signed)
Received fax from [place] requesting prior authorization of Januvia. Completed PA info in [place] for Januvia.  Status pending.  Will recheck status on next business day.  Danley Danker, RN [facility] RN)

## 2018-01-18 NOTE — Telephone Encounter (Signed)
Prior approval for Januvia completed via [place] [employer].  Med approved for 01/15/18 - 01/15/19. Prior approval # [id number].  [facility] pharmacy informed.  Danley Danker, RN [facility] RN)

## 2018-01-19 ENCOUNTER — Ambulatory Visit (INDEPENDENT_AMBULATORY_CARE_PROVIDER_SITE_OTHER): Payer: Medicare Other | Admitting: Family Medicine

## 2018-01-19 ENCOUNTER — Encounter: Payer: Self-pay | Admitting: Family Medicine

## 2018-01-19 ENCOUNTER — Other Ambulatory Visit: Payer: Self-pay

## 2018-01-19 VITALS — BP 96/62 | HR 100 | Temp 98.2°F | Wt 158.0 lb

## 2018-01-19 DIAGNOSIS — Z23 Encounter for immunization: Secondary | ICD-10-CM | POA: Diagnosis not present

## 2018-01-19 DIAGNOSIS — N183 Chronic kidney disease, stage 3 unspecified: Secondary | ICD-10-CM

## 2018-01-19 DIAGNOSIS — E1122 Type 2 diabetes mellitus with diabetic chronic kidney disease: Secondary | ICD-10-CM

## 2018-01-19 DIAGNOSIS — S161XXA Strain of muscle, fascia and tendon at neck level, initial encounter: Secondary | ICD-10-CM

## 2018-01-19 DIAGNOSIS — Z114 Encounter for screening for human immunodeficiency virus [HIV]: Secondary | ICD-10-CM

## 2018-01-19 LAB — POCT GLYCOSYLATED HEMOGLOBIN (HGB A1C): HbA1c, POC (controlled diabetic range): 6.7 % (ref 0.0–7.0)

## 2018-01-19 NOTE — Assessment & Plan Note (Addendum)
Diagnosis: 2013  Smoking status: Non smoker  Statin: Atorvastatin 20 mg, labs today  ACE/ARB if HTN: does not tolerate Metformin: CKD  Aspirin risk benefit discussion: takes  PCV 23: UTD   Current medications: Glipizide 10 mg BID and Januvia.  Consider Januvia only in future pending A1C.

## 2018-01-19 NOTE — Progress Notes (Addendum)
  Patient Name: [name] Date of Birth: 1975-09-16 Date of Visit: 01/19/18 PCP: Leeanne Rio, MD  Chief Complaint: check in on medications, labs    Subjective: [name] is a pleasant 42 y.o. year old with a history of type 2 diabetes, obesity, cognitive impairment, schizoaffective disorder and supraventricular tachycardia presenting today for follow-up.  She typically sees [name].  She is joined today by a caregiver from her group home, [name].  Her caregiver has another client with her today which limited some of our visit.  [name] the reports overall she is doing well.  She has had no problems at home.  She has had no falls at home.  She does report a 1 day history of intermittent neck pain is mild.  This is been relieved by ice. The neck pain is left sided. She believes she slept wrong last night as she could not fall or stay asleep. No falls, arm pain, chest pain.   In regards to her diabetes [name] takes glipizide 10 mg.  She cannot take metformin due to her history of chronic kidney disease.  She is previously on antihypertensive but her blood pressure was found to be quite low on this previously.  She reports her morning blood glucoses which her caregivers check range from 100-120.  In terms of her CKD, reports she does not take NSAIDs (not given at group home).   She is due for an influenza vaccine today.   ROS:  ROS Specifically denies hypoglycemia, polyuria, polydipsia.  I have reviewed the patient's medical, surgical, family, and social history as appropriate.   Vitals:   01/19/18 0927  BP: 96/62  Pulse: 100  Temp: 98.2 F (36.8 C)  SpO2: 98%   Filed Weights   01/19/18 0927  Weight: 158 lb (71.7 kg)   HEENT: Sclera anicteric. Dentition is poor. Appears well hydrated.  Neck: Supple Cardiac: Regular rate and rhythm. Normal S1/S2. No murmurs, rubs, or gallops appreciated. Lungs: Clear bilaterally to ascultation.  Abdomen: Normoactive bowel  sounds. No tenderness to deep or light palpation. No rebound or guarding.  Extremities: Warm, well perfused without edema.  Skin: Warm, dry Psych: Delayed answering of questions  [name] was seen today for diabetes.  Diagnoses and all orders for this visit:  Type 2 diabetes mellitus with other kidney complication, unspecified whether long term insulin use ([place]), currently at goal.  She is on glipizide and Januvia. This is a lower risk of hypoglycemia.  She is not on an ACE inhibitor due to her history of low blood pressure with this.  She is already on a statin.  She is a non-smoker -     HgB A1c - Consider SGLT2 inhibitor given CKD in future.   Neck Pain likely musculoskeletal, reviewed return precautions, suspect due to spasm of trapezius. Ice and Tylenol as needed.   CHRONIC KIDNEY DISEASE STAGE III (MODERATE) -     Basic Metabolic Panel  Screening for HIV (human immunodeficiency virus) -     HIV antibody (with reflex)  Need for immunization against influenza -     Flu Vaccine QUAD 36+ mos IM  Jacqueline Singh, MD  Family Medicine Teaching Service

## 2018-01-19 NOTE — Patient Instructions (Signed)
It was wonderful to see you today.  Thank you for choosing [facility].   Please call [phone] with any questions about today's appointment.  Please be sure to schedule follow up at the front  desk before you leave today.   Dorris Singh, MD  Family Medicine

## 2018-01-20 LAB — BASIC METABOLIC PANEL
BUN/Creatinine Ratio: 13 (ref 9–23)
BUN: 25 mg/dL — ABNORMAL HIGH (ref 6–24)
CO2: 19 mmol/L — ABNORMAL LOW (ref 20–29)
CREATININE: 1.88 mg/dL — AB (ref 0.57–1.00)
Calcium: 8.7 mg/dL (ref 8.7–10.2)
Chloride: 108 mmol/L — ABNORMAL HIGH (ref 96–106)
GFR, EST AFRICAN AMERICAN: 37 mL/min/{1.73_m2} — AB (ref >59)
GFR, EST NON AFRICAN AMERICAN: 32 mL/min/{1.73_m2} — AB (ref >59)
Glucose: 276 mg/dL — ABNORMAL HIGH (ref 65–99)
POTASSIUM: 4.4 mmol/L (ref 3.5–5.2)
Sodium: 139 mmol/L (ref 134–144)

## 2018-01-20 LAB — HIV ANTIBODY (ROUTINE TESTING W REFLEX): HIV Screen 4th Generation wRfx: NONREACTIVE

## 2018-01-21 ENCOUNTER — Telehealth: Payer: Self-pay | Admitting: Family Medicine

## 2018-01-21 DIAGNOSIS — N183 Chronic kidney disease, stage 3 unspecified: Secondary | ICD-10-CM

## 2018-01-21 NOTE — Telephone Encounter (Signed)
Called patient's caregiver and contact, Naida Sleight.   HbA1C Congratulated caregivers and patient on A1C of 6.7. Blood glucose on BMET is 267 (suggestive that postprandial excursions continue, also renal disease may reduce sensitivity of assay for A1C).   CKD Has known CKD Stage III. Baseline creatinine since 2014 between 1.3 to as high as 1.8. Mostly around 1.4-1.6. Slight rise, possibly due to dehydration. Repeat ordered next week.   HIV negative. Repeat BMET next week. Caregiver prefers Tuesday.   Dorris Singh, MD  Family Medicine

## 2018-01-27 ENCOUNTER — Other Ambulatory Visit (INDEPENDENT_AMBULATORY_CARE_PROVIDER_SITE_OTHER): Payer: Medicare Other

## 2018-01-27 DIAGNOSIS — E1122 Type 2 diabetes mellitus with diabetic chronic kidney disease: Secondary | ICD-10-CM

## 2018-01-27 DIAGNOSIS — N183 Chronic kidney disease, stage 3 unspecified: Secondary | ICD-10-CM

## 2018-01-27 LAB — GLUCOSE, POCT (MANUAL RESULT ENTRY): POC Glucose: 352 mg/dl — AB (ref 70–99)

## 2018-01-27 NOTE — Telephone Encounter (Signed)
Patient caregiver left message on nurse line. Stated that when patient came in for labs today her cbg was high and patient is not feeling well. Is there anything she can do to bring it down?  Call back is [phone]  Danley Danker, RN [facility] RN)

## 2018-01-27 NOTE — Telephone Encounter (Signed)
Called patient's caregiver [name].  [name] is currently taking over for [place].  Discussed elevation in glucose as compared to most recent hemoglobin A1c's at length.  [name] reports they have a number of new staff.  She reports they have been giving her sodas as well as having meals which are rich in carbohydrates.  For example last night they had "beanies and weanies" this included baked beans with quite a bit of sugar per [name] as well as hotdogs.  Recommended monitoring diet and documenting over next week.  In addition log every morning blood glucose.  Recommended eliminating sugar sweetened beverages and limiting carbohydrate portions.  Reviewed reasons to return to care and call.

## 2018-01-28 ENCOUNTER — Telehealth: Payer: Self-pay

## 2018-01-28 LAB — BASIC METABOLIC PANEL
BUN / CREAT RATIO: 14 (ref 9–23)
BUN: 22 mg/dL (ref 6–24)
CO2: 18 mmol/L — ABNORMAL LOW (ref 20–29)
CREATININE: 1.58 mg/dL — AB (ref 0.57–1.00)
Calcium: 9.1 mg/dL (ref 8.7–10.2)
Chloride: 110 mmol/L — ABNORMAL HIGH (ref 96–106)
GFR calc non Af Amer: 40 mL/min/{1.73_m2} — ABNORMAL LOW (ref >59)
GFR, EST AFRICAN AMERICAN: 46 mL/min/{1.73_m2} — AB (ref >59)
GLUCOSE: 373 mg/dL — AB (ref 65–99)
Potassium: 5 mmol/L (ref 3.5–5.2)
Sodium: 146 mmol/L — ABNORMAL HIGH (ref 134–144)

## 2018-01-28 NOTE — Telephone Encounter (Signed)
Patient caregiver, Morey Hummingbird, left message that she is returning a call to Dr Owens Shark with some information that Dr Owens Shark needed.  Call back is [phone]  Danley Danker, RN [facility] RN)

## 2018-01-28 NOTE — Telephone Encounter (Signed)
Returned call to [place], caregiver. AM blood glucose with dietary changes was 141. Patient is feeling well. All questions were answered. Follow up in 2-3 weeks for repeat blood work.   Dorris Singh, MD  Family Medicine Teaching Service

## 2018-02-08 ENCOUNTER — Other Ambulatory Visit: Payer: Self-pay | Admitting: Family Medicine

## 2018-02-16 DIAGNOSIS — F70 Mild intellectual disabilities: Secondary | ICD-10-CM | POA: Diagnosis not present

## 2018-02-16 DIAGNOSIS — Z79899 Other long term (current) drug therapy: Secondary | ICD-10-CM | POA: Diagnosis not present

## 2018-02-16 DIAGNOSIS — Z6826 Body mass index (BMI) 26.0-26.9, adult: Secondary | ICD-10-CM | POA: Diagnosis not present

## 2018-02-16 DIAGNOSIS — F250 Schizoaffective disorder, bipolar type: Secondary | ICD-10-CM | POA: Diagnosis not present

## 2018-02-23 ENCOUNTER — Other Ambulatory Visit: Payer: Self-pay | Admitting: Family Medicine

## 2018-03-15 ENCOUNTER — Ambulatory Visit (INDEPENDENT_AMBULATORY_CARE_PROVIDER_SITE_OTHER): Payer: Medicare Other

## 2018-03-15 DIAGNOSIS — F250 Schizoaffective disorder, bipolar type: Secondary | ICD-10-CM | POA: Diagnosis not present

## 2018-03-15 DIAGNOSIS — Z3042 Encounter for surveillance of injectable contraceptive: Secondary | ICD-10-CM

## 2018-03-15 DIAGNOSIS — Z6826 Body mass index (BMI) 26.0-26.9, adult: Secondary | ICD-10-CM | POA: Diagnosis not present

## 2018-03-15 DIAGNOSIS — Z79899 Other long term (current) drug therapy: Secondary | ICD-10-CM | POA: Diagnosis not present

## 2018-03-15 DIAGNOSIS — F70 Mild intellectual disabilities: Secondary | ICD-10-CM | POA: Diagnosis not present

## 2018-03-15 MED ORDER — MEDROXYPROGESTERONE ACETATE 150 MG/ML IM SUSY
150.0000 mg | PREFILLED_SYRINGE | Freq: Once | INTRAMUSCULAR | Status: AC
  Administered 2018-03-15: 150 mg via INTRAMUSCULAR

## 2018-03-15 NOTE — Progress Notes (Signed)
   Patient in to nurse clinic within dates for Depo-Provera injection. Injection given LUOQ. Next injection due 05/31/18-06/14/18. Reminder card given. Danley Danker, RN [facility] RN)

## 2018-03-18 ENCOUNTER — Telehealth: Payer: Self-pay | Admitting: Family Medicine

## 2018-03-18 NOTE — Telephone Encounter (Signed)
[facility] called requesting a new prescription for this patients Accu-Chek Aviva Plus test strips. Patients insurance has new requirements of what the prescription is supposed to have on it. The prescription has to have the diagnosis code on it and state whether or not the patient is on insulin. It still has to have the dosage, and how often the patient is to check their sugar as well. If the updated prescription does not have all this information on it then the pharmacy will be audited.  Please sent this new prescription electronically.

## 2018-03-22 NOTE — Telephone Encounter (Signed)
2nd request. [place], Salome Spotted, [place]

## 2018-03-23 MED ORDER — GLUCOSE BLOOD VI STRP: ORAL_STRIP | 11 refills | Status: DC

## 2018-03-23 NOTE — Telephone Encounter (Signed)
New rx sent Leeanne Rio, MD

## 2018-04-06 ENCOUNTER — Other Ambulatory Visit: Payer: Self-pay | Admitting: Family Medicine

## 2018-04-12 ENCOUNTER — Other Ambulatory Visit: Payer: Self-pay | Admitting: Family Medicine

## 2018-05-11 DIAGNOSIS — F250 Schizoaffective disorder, bipolar type: Secondary | ICD-10-CM | POA: Diagnosis not present

## 2018-05-11 DIAGNOSIS — Z6826 Body mass index (BMI) 26.0-26.9, adult: Secondary | ICD-10-CM | POA: Diagnosis not present

## 2018-05-11 DIAGNOSIS — Z79899 Other long term (current) drug therapy: Secondary | ICD-10-CM | POA: Diagnosis not present

## 2018-05-11 DIAGNOSIS — F70 Mild intellectual disabilities: Secondary | ICD-10-CM | POA: Diagnosis not present

## 2018-05-13 ENCOUNTER — Other Ambulatory Visit: Payer: Self-pay | Admitting: Family Medicine

## 2018-05-13 MED ORDER — GLIPIZIDE 10 MG PO TABS: ORAL_TABLET | ORAL | 1 refills | Status: DC

## 2018-05-13 MED ORDER — ASPIRIN 81 MG PO CHEW: CHEWABLE_TABLET | ORAL | 3 refills | Status: DC

## 2018-05-13 NOTE — Telephone Encounter (Signed)
Attempted to call patient regarding scheduling a follow up appointment and refills sent in. No answer and no voice mail.  [name], [place]

## 2018-05-13 NOTE — Telephone Encounter (Signed)
Attempted to call patient again to make appointment and inform concerning refills.  If patient calls back then please schedule an appointment.  [name], [place]

## 2018-05-13 NOTE — Telephone Encounter (Signed)
Refills sent. Please have patient schedule a follow up appointment with me, thanks. Leeanne Rio, MD

## 2018-05-13 NOTE — Telephone Encounter (Signed)
Pt needs Asprin 81 mg and Glipizide 65m refilled.

## 2018-05-18 DIAGNOSIS — F70 Mild intellectual disabilities: Secondary | ICD-10-CM | POA: Diagnosis not present

## 2018-05-18 DIAGNOSIS — F250 Schizoaffective disorder, bipolar type: Secondary | ICD-10-CM | POA: Diagnosis not present

## 2018-05-24 ENCOUNTER — Other Ambulatory Visit: Payer: Self-pay | Admitting: Family Medicine

## 2018-05-31 ENCOUNTER — Ambulatory Visit (INDEPENDENT_AMBULATORY_CARE_PROVIDER_SITE_OTHER): Payer: Medicare Other

## 2018-05-31 DIAGNOSIS — Z3042 Encounter for surveillance of injectable contraceptive: Secondary | ICD-10-CM | POA: Diagnosis not present

## 2018-05-31 MED ORDER — MEDROXYPROGESTERONE ACETATE 150 MG/ML IM SUSP
150.0000 mg | Freq: Once | INTRAMUSCULAR | Status: AC
  Administered 2018-05-31: 150 mg via INTRAMUSCULAR

## 2018-05-31 NOTE — Progress Notes (Signed)
Pt presents in nurse clinic for depo injection. Pt is within dates, no urine pregnancy needed. Injection given RUOQ, pt tolerated injection well. Next depo due, April 7-21st, reminder card given.

## 2018-06-10 DIAGNOSIS — Z6826 Body mass index (BMI) 26.0-26.9, adult: Secondary | ICD-10-CM | POA: Diagnosis not present

## 2018-06-10 DIAGNOSIS — Z79899 Other long term (current) drug therapy: Secondary | ICD-10-CM | POA: Diagnosis not present

## 2018-06-26 ENCOUNTER — Ambulatory Visit (HOSPITAL_COMMUNITY)
Admission: EM | Admit: 2018-06-26 | Discharge: 2018-06-26 | Disposition: A | Discharge status: Home / Self Care | Payer: Medicare Other | Attending: Family Medicine | Admitting: Family Medicine

## 2018-06-26 ENCOUNTER — Encounter (HOSPITAL_COMMUNITY): Payer: Self-pay | Admitting: *Deleted

## 2018-06-26 ENCOUNTER — Other Ambulatory Visit: Payer: Self-pay

## 2018-06-26 DIAGNOSIS — R112 Nausea with vomiting, unspecified: Secondary | ICD-10-CM

## 2018-06-26 DIAGNOSIS — R000 Tachycardia, unspecified: Secondary | ICD-10-CM | POA: Diagnosis not present

## 2018-06-26 DIAGNOSIS — R739 Hyperglycemia, unspecified: Secondary | ICD-10-CM | POA: Diagnosis not present

## 2018-06-26 DIAGNOSIS — E119 Type 2 diabetes mellitus without complications: Secondary | ICD-10-CM

## 2018-06-26 DIAGNOSIS — Z3202 Encounter for pregnancy test, result negative: Secondary | ICD-10-CM

## 2018-06-26 LAB — POCT URINALYSIS DIP (DEVICE)
BILIRUBIN URINE: NEGATIVE
Glucose, UA: 250 mg/dL — AB
Hgb urine dipstick: NEGATIVE
Ketones, ur: NEGATIVE mg/dL
Nitrite: NEGATIVE
Protein, ur: 30 mg/dL — AB
Specific Gravity, Urine: <=1.005 (ref 1.005–1.030)
Urobilinogen, UA: 0.2 mg/dL (ref 0.0–1.0)
pH: 6 (ref 5.0–8.0)

## 2018-06-26 LAB — GLUCOSE, CAPILLARY
Glucose-Capillary: 350 mg/dL — ABNORMAL HIGH (ref 70–99)
Glucose-Capillary: 329 mg/dL — ABNORMAL HIGH (ref 70–99)

## 2018-06-26 LAB — POCT PREGNANCY, URINE: Preg Test, Ur: NEGATIVE

## 2018-06-26 MED ORDER — SODIUM CHLORIDE 0.9 % IV BOLUS: 1000.0000 mL | Freq: Once | INTRAVENOUS | Status: DC

## 2018-06-26 MED ORDER — ONDANSETRON HCL 4 MG/2ML IJ SOLN: 4.0000 mg | Freq: Once | INTRAMUSCULAR | Status: DC

## 2018-06-26 MED ORDER — ONDANSETRON HCL 4 MG/2ML IJ SOLN
INTRAMUSCULAR | Status: AC
  Filled 2018-06-26: qty 2

## 2018-06-26 MED ORDER — INSULIN ASPART 100 UNIT/ML ~~LOC~~ SOLN
8.0000 [IU] | Freq: Once | SUBCUTANEOUS | Status: AC
  Administered 2018-06-26: 8 [IU] via SUBCUTANEOUS

## 2018-06-26 MED ORDER — ONDANSETRON 4 MG PO TBDP: 4.0000 mg | ORAL_TABLET | Freq: Three times a day (TID) | ORAL | 0 refills | Status: DC | PRN

## 2018-06-26 MED ORDER — ONDANSETRON 4 MG PO TBDP
4.0000 mg | ORAL_TABLET | Freq: Once | ORAL | Status: AC
  Administered 2018-06-26: 4 mg via ORAL

## 2018-06-26 NOTE — ED Provider Notes (Signed)
[place]   [id number] 06/26/18 Arrival Time: 1010  ASSESSMENT & PLAN:  1. Hyperglycemia   2. Intractable vomiting with nausea, unspecified vomiting type   3. Tachycardia    Unable to obtain IV access after multiple tries. Insulin and ondansetron given here. Able to tolerate sips of water without emesis. She should be able to take her PO home medications now. No urinary ketones or mental status changes. No suspicion for occult infection.  Meds ordered this encounter  Medications  . ondansetron (ZOFRAN) injection 4 mg  . sodium chloride 0.9 % bolus 1,000 mL  . ondansetron (ZOFRAN) injection 4 mg  . insulin aspart (novoLOG) injection 8 Units  . ondansetron (ZOFRAN-ODT) disintegrating tablet 4 mg   Recheck blood glucose: 329.  Will monitor her blood sugars today through tomorrow. If these begin increasing rapidly or if she begins to feel worse in any way, including inability to drink fluids, she will proceed directly to the ED.  Will do her best to ensure adequate fluid intake in order to avoid dehydration.  Otherwise she will f/u with her PCP or here if not showing improvement over the next 48-72 hours.  Reviewed expectations re: course of current medical issues. Questions answered. Outlined signs and symptoms indicating need for more acute intervention. Patient verbalized understanding. After Visit Summary given.   SUBJECTIVE: History from: patient.  [name] is a 43 y.o. female who presents with complaint of non-bilious, non-bloody intermittent nausea and vomiting of brown material without diarrhea. Onset abrupt, this morning. Reports eating a large box of chocolates before bed last evening. No specific abdominal pain reported. Symptoms are unchanged since beginning. Aggravating factors: eating. Alleviating factors: none. Associated symptoms: mild fatigue. She denies arthralgias, belching, chills, dysuria, headache, myalgias and sweats. Appetite: decreased. PO  intake: decreased. "Feel thirsty though." Ambulatory without assistance. Urinary symptoms: none. Last HgA1c 6.7 on 01/19/2018. Sick contacts: none. Recent travel: none. OTC treatment: none. Lives in a group home.  No LMP recorded. Patient has had an injection.  Past Surgical History:  Procedure Laterality Date  . BREAST REDUCTION SURGERY  08/10/2005  . HYSTEROSCOPY  09/09/2004   w/ removal of polyps    ROS: As per HPI. All other systems negative.  OBJECTIVE:  Vitals:   06/26/18 1046  BP: 114/66  Pulse: (!) 128  Resp: 16  Temp: 98.8 F (37.1 C)  TempSrc: Oral  SpO2: 100%    General appearance: alert; no distress Oropharynx: lips dry but otherwise moist Lungs: clear to auscultation bilaterally; unlabored Heart: tachycardic; regular rhythm Abdomen: soft; non-distended; no significant abdominal tenderness; reports "cramping" feeling; bowel sounds present; no masses or organomegaly; no guarding or rebound tenderness Back: no CVA tenderness Extremities: no edema; symmetrical with no gross deformities Skin: warm; dry Neurologic: normal gait Psychological: alert and cooperative; normal mood and affect  Labs: Results for orders placed or performed during the hospital encounter of 06/26/18  Glucose, capillary  Result Value Ref Range   Glucose-Capillary 350 (H) 70 - 99 mg/dL  Glucose, capillary  Result Value Ref Range   Glucose-Capillary 329 (H) 70 - 99 mg/dL  POCT urinalysis dip (device)  Result Value Ref Range   Glucose, UA 250 (A) NEGATIVE mg/dL   Bilirubin Urine NEGATIVE NEGATIVE   Ketones, ur NEGATIVE NEGATIVE mg/dL   Specific Gravity, Urine <=1.005 1.005 - 1.030   Hgb urine dipstick NEGATIVE NEGATIVE   pH 6.0 5.0 - 8.0   Protein, ur 30 (A) NEGATIVE mg/dL   Urobilinogen, UA 0.2  0.0 - 1.0 mg/dL   Nitrite NEGATIVE NEGATIVE   Leukocytes,Ua SMALL (A) NEGATIVE  Pregnancy, urine POC  Result Value Ref Range   Preg Test, Ur NEGATIVE NEGATIVE     Allergies  Allergen  Reactions  . Minocycline     unknown                                               Past Medical History:  Diagnosis Date  . Acne   . Anemia   . Cognitive impairment   . Diabetes mellitus without complication ([place])   . Internal hemorrhoid, bleeding 10/22/2010  . Obesity   . Schizoaffective disorder   . SVT (supraventricular tachycardia) (HCC)    with short PR interval - followed by Dr. Lovena Le   Social History   Socioeconomic History  . Marital status: Single    Spouse name: Not on file  . Number of children: Not on file  . Years of education: Not on file  . Highest education level: Not on file  Occupational History  . Not on file  Social Needs  . Financial resource strain: Not on file  . Food insecurity:    Worry: Not on file    Inability: Not on file  . Transportation needs:    Medical: Not on file    Non-medical: Not on file  Tobacco Use  . Smoking status: Never Smoker  . Smokeless tobacco: Never Used  Substance and Sexual Activity  . Alcohol use: No  . Drug use: No  . Sexual activity: Never    Birth control/protection: Injection  Lifestyle  . Physical activity:    Days per week: Not on file    Minutes per session: Not on file  . Stress: Not on file  Relationships  . Social connections:    Talks on phone: Not on file    Gets together: Not on file    Attends religious service: Not on file    Active member of club or organization: Not on file    Attends meetings of clubs or organizations: Not on file    Relationship status: Not on file  . Intimate partner violence:    Fear of current or ex partner: Not on file    Emotionally abused: Not on file    Physically abused: Not on file    Forced sexual activity: Not on file  Other Topics Concern  . Not on file  Social History Narrative   Caregiver is Morrell Riddle. [place] [facility] [place] Cell [phone]/[id number] lived in group home. Twin sister with similar problem list. Has 24H  care.      Case manager is Justin Mend, [phone].      Caregiver works for [employer], [address] [place], [place] [phone]. Abbey Chatters is clinical supervisor, [phone].      Sammuel Bailiff is [employer] [place] [employer] [place]., [phone] office. Cell [phone].   Family History  Problem Relation Age of Onset  . Schizophrenia Sister      Vanessa Kick, MD 07/01/18 [phone]

## 2018-06-26 NOTE — ED Notes (Signed)
Per pt and family member: states woke up with nausea and intermittent abd pains.  Denies fever or diarrhea.  CBG just prior to arrival = 311 at home.  Family member states she found pt was putting finger down throat - pt states she feels she would feel better if she could vomit.  Pt is on oral hypoglycemics, no insulin.

## 2018-06-26 NOTE — Discharge Instructions (Addendum)
You have been seen today for high blood sugars with persistent vomiting. Your evaluation was not suggestive of any emergent condition requiring admission to the hospital at this time. However, some problems make take more time to appear. Therefore, it's very important for you to pay attention to any new symptoms or worsening of your current condition.  Please return here or to the Emergency Department immediately should you feel worse in any way or have any of the following symptoms: abdominal pain, persistent vomiting, fevers, or inability to drink fluids.

## 2018-06-29 ENCOUNTER — Ambulatory Visit (INDEPENDENT_AMBULATORY_CARE_PROVIDER_SITE_OTHER): Payer: Medicare Other | Admitting: Family Medicine

## 2018-06-29 VITALS — BP 125/80 | HR 112 | Temp 98.6°F | Wt 161.0 lb

## 2018-06-29 DIAGNOSIS — L0231 Cutaneous abscess of buttock: Secondary | ICD-10-CM

## 2018-06-29 DIAGNOSIS — E1122 Type 2 diabetes mellitus with diabetic chronic kidney disease: Secondary | ICD-10-CM

## 2018-06-29 DIAGNOSIS — N183 Chronic kidney disease, stage 3 (moderate): Secondary | ICD-10-CM | POA: Diagnosis not present

## 2018-06-29 DIAGNOSIS — L0291 Cutaneous abscess, unspecified: Secondary | ICD-10-CM | POA: Insufficient documentation

## 2018-06-29 DIAGNOSIS — K219 Gastro-esophageal reflux disease without esophagitis: Secondary | ICD-10-CM | POA: Diagnosis not present

## 2018-06-29 LAB — POCT GLYCOSYLATED HEMOGLOBIN (HGB A1C): HbA1c, POC (controlled diabetic range): 8.3 % — AB (ref 0.0–7.0)

## 2018-06-29 MED ORDER — CLINDAMYCIN HCL 300 MG PO CAPS: 300.0000 mg | ORAL_CAPSULE | Freq: Four times a day (QID) | ORAL | 0 refills | Status: AC

## 2018-06-29 NOTE — Assessment & Plan Note (Signed)
Has already spontaneously drained. Indurated and tender, mildly erythematous without fluctuance today. Will rx clindamycin and have patient do warm compresses 3 times a day. Follow up in 1 week to see how it is doing, sooner if worsening.

## 2018-06-29 NOTE — Progress Notes (Signed)
a1c

## 2018-06-29 NOTE — Patient Instructions (Addendum)
  For boil: - warm compresses 3 times a day for 15 minutes at a time - monitor the area closely - if any worsening or fevers please call us - take clindamycin pills - take acetaminophen 579m every 8 hours scheduled for the next 4 days  For diabetes: - next week we will probably start new medication, want boil to get better first - need to be seen every 3 months - referring to diabetes education center  For pap smear: - schedule appointment with the [facility] ([place])[phone]  Stop zofran (ondansetron)   Be well, Dr. MArdelia Mems

## 2018-06-29 NOTE — Addendum Note (Signed)
Addended by: Leeanne Rio on: 06/29/2018 01:57 PM   Modules accepted: Orders

## 2018-06-29 NOTE — Assessment & Plan Note (Signed)
Doing well on pepcid. Continue this regimen.

## 2018-06-29 NOTE — Progress Notes (Signed)
Date of Visit: 06/29/2018   HPI:  Patient presents for follow up of diabetes and high blood sugar.  Diabetes - currently taking glipizide 80m twice daily and januvia 571mdaily. Tolerating well. Fasting blood sugars since around Friday have been in the 200s-300s. [facility] due to having nausea and vomiting with elevated blood sugars. Caregiver is present and believes the elevated sugars may be due to having candy at her day program for valentine's day. Since UC visit has been doing well, though sugars still elevated. Was given rx for zofran which she has not taken. A1c today is 8.3, up from 6.7 at last check in September. Caregiver (also named ArVaydais new and does not have much experience with diabetes, and would like to go to diabetes education classes with patient.   Boil on buttock - has boil on left buttock in the infragluteal fold. Has drained some. Sore for the last several days. No fevers. Eating and drinking fine.  GERD - taking pepcid 2062mwice daily. No heartburn while on this medication. Patient feeling well.  ROS: See HPI.  [place], SVT, schizophrenia, MR  PHYSICAL EXAM: BP 125/80   Pulse (!) 112   Temp 98.6 F (37 C) (Oral)   Wt 161 lb (73 kg)   SpO2 99%   BMI 26.79 kg/m  Gen: no acute distress, pleasant, cooperative HEENT: normocephalic, atraumatic, moist mucous membranes  Heart: regular rate and rhythm, no murmur Lungs: clear to auscultation bilaterally, normal work of breathing  Neuro: alert, grossly nonfocal, speech normal Skin: 5cm by 3cm area of induration in skin fold of left buttock. One area has already spontaenously opened and has dried pus/crust at the opening. No fluctuance. Mildly erythematous. Tender to palpation.  ASSESSMENT/PLAN:  Health maintenance:  -will request eye exam records -due for pap smear, advised caregiver [facility] they have had to use infant speculum on patient in the  past.  Skin abscess Has already spontaneously drained. Indurated and tender, mildly erythematous without fluctuance today. Will rx clindamycin and have patient do warm compresses 3 times a day. Follow up in 1 week to see how it is doing, sooner if worsening.  Type 2 diabetes mellitus with renal manifestations (HCC) Uncontrolled with A1c now of 8.3. Plan to add jardiance 31m81mily, but with current infection on buttock will hold on adding jardiance at this time. Patient to follow up in 1 week, if improved then will likely add jardiance.  GERD (gastroesophageal reflux disease) Doing well on pepcid. Continue this regimen.  FOLLOW UP: Follow up in 1 week for diabetes & abscess  [place]InArdelia Mems CBird[place]in[place]

## 2018-06-29 NOTE — Assessment & Plan Note (Signed)
Uncontrolled with A1c now of 8.3. Plan to add jardiance 20m daily, but with current infection on buttock will hold on adding jardiance at this time. Patient to follow up in 1 week, if improved then will likely add jardiance.

## 2018-06-30 ENCOUNTER — Telehealth: Payer: Self-pay | Admitting: Medical

## 2018-06-30 ENCOUNTER — Telehealth: Payer: Self-pay | Admitting: Family Medicine

## 2018-06-30 DIAGNOSIS — Z01419 Encounter for gynecological examination (general) (routine) without abnormal findings: Secondary | ICD-10-CM

## 2018-06-30 NOTE — Telephone Encounter (Signed)
Therapist called in for patient stated her doctor told her to call our office for an appointment. Stated the pap she was doctor was attempting to perform was hurting the patient and she was told to come to our office because we have smaller tools. Informed the caller our office would need the referral and also gave the patient options to visit offices closer to her home- [facility].

## 2018-06-30 NOTE — Telephone Encounter (Signed)
Pt's caregiver called [facility] to schedule her pap smear. The hospital said that [name] would need to place a referral before they could schedule this appointment.

## 2018-06-30 NOTE — Telephone Encounter (Signed)
Referral entered, thanks! Leeanne Rio, MD

## 2018-07-06 ENCOUNTER — Ambulatory Visit: Payer: Medicare Other | Admitting: Family Medicine

## 2018-07-06 DIAGNOSIS — Z79899 Other long term (current) drug therapy: Secondary | ICD-10-CM | POA: Diagnosis not present

## 2018-07-07 ENCOUNTER — Other Ambulatory Visit: Payer: Self-pay | Admitting: Family Medicine

## 2018-07-09 ENCOUNTER — Ambulatory Visit (INDEPENDENT_AMBULATORY_CARE_PROVIDER_SITE_OTHER): Payer: Medicare Other | Admitting: Family Medicine

## 2018-07-09 ENCOUNTER — Encounter: Payer: Self-pay | Admitting: Family Medicine

## 2018-07-09 ENCOUNTER — Other Ambulatory Visit: Payer: Self-pay | Admitting: Family Medicine

## 2018-07-09 ENCOUNTER — Other Ambulatory Visit: Payer: Self-pay

## 2018-07-09 DIAGNOSIS — N183 Chronic kidney disease, stage 3 unspecified: Secondary | ICD-10-CM

## 2018-07-09 DIAGNOSIS — E1122 Type 2 diabetes mellitus with diabetic chronic kidney disease: Secondary | ICD-10-CM

## 2018-07-09 DIAGNOSIS — L0231 Cutaneous abscess of buttock: Secondary | ICD-10-CM

## 2018-07-09 NOTE — Progress Notes (Signed)
Date of Visit: 07/09/2018   HPI:  [name] presents for follow up of buttock abscess and diabetes. She is accompanied by her new caregiver, who is also named Haidynn.  Butt abscess - finished course of clindamycin. Area has gotten a lot better. No longer painful or as big. Not draining.  Diabetes - has upcoming appointment with nutritional & diabetes center for individual counseling. Patient and caregiver are excited about this and are motivated to make dietary changes to get diabetes under control. Sugars have been doing somewhat better over the last week.  ROS: See HPI.  [place]: history of acne, CKD3, MR, schizophrenia, SVT, type 2 diabetes, GERD  PHYSICAL EXAM: BP 102/72   Pulse 99   Temp 99 F (37.2 C) (Oral)   Ht 5' 5"  (1.651 m)   Wt 161 lb 3.2 oz (73.1 kg)   SpO2 97%   BMI 26.83 kg/m  Gen: no acute distress, pleasant, cooperative, well appearing Skin: well healing area of prior abscess in area of left infragluteal. No tenderness to palpation. Still 2-3cm area of induration more deeply but nontender. No drainage, warmth, or erythema.   ASSESSMENT/PLAN:  Skin abscess Greatly improved. No longer painful, red, or warm. Still an area of induration, likely residual inflammation. Continue to monitor. Discussed return precautions with caregiver.  Type 2 diabetes mellitus with renal manifestations [facility]) As patient & caregiver so motivated to make dietary changes and have appointment with diabetes education center, will hold on adding jardiance at this time. They will monitor sugars and if fasting sugars are consistently above 160 they will come in sooner, otherwise follow up with me in 3 months for next A1c.  FOLLOW UP: Follow up in 3 mos for diabetes   [name], [place]

## 2018-07-09 NOTE — Assessment & Plan Note (Signed)
As patient & caregiver so motivated to make dietary changes and have appointment with diabetes education center, will hold on adding jardiance at this time. They will monitor sugars and if fasting sugars are consistently above 160 they will come in sooner, otherwise follow up with me in 3 months for next A1c.

## 2018-07-09 NOTE — Assessment & Plan Note (Signed)
Greatly improved. No longer painful, red, or warm. Still an area of induration, likely residual inflammation. Continue to monitor. Discussed return precautions with caregiver.

## 2018-07-09 NOTE — Patient Instructions (Signed)
Work on diet and exercise Follow up with me in 3 months for diabetes We may need to start a new medication at that time if sugars aren't better  Bottom looks good Continue to monitor it  Be well, [name]

## 2018-07-13 ENCOUNTER — Other Ambulatory Visit: Payer: Self-pay | Admitting: Family Medicine

## 2018-07-14 ENCOUNTER — Encounter: Payer: Medicare Other | Attending: Family Medicine | Admitting: *Deleted

## 2018-07-14 DIAGNOSIS — E1122 Type 2 diabetes mellitus with diabetic chronic kidney disease: Secondary | ICD-10-CM | POA: Insufficient documentation

## 2018-07-14 DIAGNOSIS — N183 Chronic kidney disease, stage 3 (moderate): Secondary | ICD-10-CM | POA: Insufficient documentation

## 2018-07-14 NOTE — Progress Notes (Signed)
Diabetes Self-Management Education  Visit Type: First/Initial  Appt. Start Time: 0830 Appt. End Time: 0930  07/23/2018  Ms. [name], identified by name and date of birth, is a 43 y.o. female with a diagnosis of Diabetes: Type 2.  Patient and caregiver were 20 minutes late due to going to wrong building, will decrease visit time to 60 minutes. Caregiver, Shekia, expressed interest in learning more about diabetes so she could take good care of the patient. Patient attends a day care and eats breakfast and dinner at group home.   ASSESSMENT  There were no vitals taken for this visit. There is no height or weight on file to calculate BMI.  Diabetes Self-Management Education - 07/14/18 0830      Visit Information   Visit Type  First/Initial      Initial Visit   Diabetes Type  Type 2    Are you currently following a meal plan?  No    Are you taking your medications as prescribed?  Yes    Date Diagnosed  don't know      Psychosocial Assessment   Patient Belief/Attitude about Diabetes  --   don't like it   Self-care barriers  Low literacy    Other persons present  Patient;Other (comment)   care giver, patient is mentally challenged   Patient Concerns  Nutrition/Meal planning;Glycemic Control    Special Needs  Instruct caregiver    How often do you need to have someone help you when you read instructions, pamphlets, or other written materials from your doctor or pharmacy?  5 - Always    What is the last grade level you completed in school?  11th      Pre-Education Assessment   Patient understands the diabetes disease and treatment process.  Needs Instruction    Patient understands incorporating nutritional management into lifestyle.  Needs Instruction    Patient undertands incorporating physical activity into lifestyle.  Needs Instruction    Patient understands using medications safely.  Needs Instruction    Patient understands monitoring blood glucose, interpreting and using  results  Needs Instruction    Patient understands prevention, detection, and treatment of acute complications.  Needs Instruction    Patient understands prevention, detection, and treatment of chronic complications.  Needs Instruction    Patient understands how to develop strategies to address psychosocial issues.  Needs Instruction    Patient understands how to develop strategies to promote health/change behavior.  Needs Instruction      Complications   Last HgB A1C per patient/outside source  8.3 %    How often do you check your blood sugar?  1-2 times/day    Have you had a dilated eye exam in the past 12 months?  Yes    Are you checking your feet?  No      Dietary Intake   Breakfast  cereal Mon - Fri, eggs or sausage with pancakes with sugar free syrup on weekends    Lunch  sandwich, fruit cup, crackers OR left overs OR frozen meal    Snack (afternoon)  PNB crackers, yogurt OR SF jello    Dinner  meat, vegetables, and starch    Beverage(s)  water, juice, milk, Naked sugar free mango smootie      Exercise   Exercise Type  ADL's    How many days per week to you exercise?  2    How many minutes per day do you exercise?  10    Total minutes per  week of exercise  20      Patient Education   Previous Diabetes Education  No    Disease state   Explored patient's options for treatment of their diabetes    Nutrition management   Carbohydrate counting;Food label reading, portion sizes and measuring food.;Role of diet in the treatment of diabetes and the relationship between the three main macronutrients and blood glucose level;Reviewed blood glucose goals for pre and post meals and how to evaluate the patients' food intake on their blood glucose level.    Physical activity and exercise   Helped patient identify appropriate exercises in relation to his/her diabetes, diabetes complications and other health issue.    Medications  Reviewed patients medication for diabetes, action, purpose, timing  of dose and side effects.    Monitoring  Identified appropriate SMBG and/or A1C goals.    Chronic complications  Relationship between chronic complications and blood glucose control    Psychosocial adjustment  Role of stress on diabetes      Individualized Goals (developed by patient)   Nutrition  Follow meal plan discussed    Physical Activity  Exercise 3-5 times per week    Medications  take my medication as prescribed    Monitoring   test blood glucose pre and post meals as discussed      Post-Education Assessment   Patient understands the diabetes disease and treatment process.  Demonstrates understanding / competency    Patient understands incorporating nutritional management into lifestyle.  Demonstrates understanding / competency    Patient undertands incorporating physical activity into lifestyle.  Demonstrates understanding / competency    Patient understands using medications safely.  Demonstrates understanding / competency    Patient understands monitoring blood glucose, interpreting and using results  Demonstrates understanding / competency    Patient understands prevention, detection, and treatment of acute complications.  Demonstrates understanding / competency    Patient understands prevention, detection, and treatment of chronic complications.  Demonstrates understanding / competency    Patient understands how to develop strategies to address psychosocial issues.  Demonstrates understanding / competency    Patient understands how to develop strategies to promote health/change behavior.  Demonstrates understanding / competency      Outcomes   Expected Outcomes  Demonstrated interest in learning. Expect positive outcomes    Future DMSE  PRN       Individualized Plan for Diabetes Self-Management Training:   Learning Objective:  Patient will have a greater understanding of diabetes self-management. Patient education plan is to attend individual and/or group sessions per  assessed needs and concerns.   Plan:   Patient Instructions  Plan:  Aim for 3 Carb Choices per meal (45 grams) +/- 1 either way  Aim for 0-2 Carbs per snack if hungry  Include protein in moderation with your meals and snacks Consider reading food labels for Total Carbohydrate of foods Consider  increasing your activity level by dancing or walking for 15 minutes daily as tolerated Consider checking BG at alternate times per day  Continue taking medication as directed by MD  Expected Outcomes:  Demonstrated interest in learning. Expect positive outcomes  Education material provided: Food label handouts, A1C conversion sheet, Meal plan card and Carbohydrate counting sheet, Arm Chair Exercise handout  If problems or questions, patient to contact team via:  Phone  Future DSME appointment: PRN

## 2018-07-14 NOTE — Patient Instructions (Signed)
Plan:  Aim for 3 Carb Choices per meal (45 grams) +/- 1 either way  Aim for 0-2 Carbs per snack if hungry  Include protein in moderation with your meals and snacks Consider reading food labels for Total Carbohydrate of foods Consider  increasing your activity level by dancing or walking for 15 minutes daily as tolerated Consider checking BG at alternate times per day  Continue taking medication as directed by MD

## 2018-07-27 DIAGNOSIS — Z79899 Other long term (current) drug therapy: Secondary | ICD-10-CM | POA: Diagnosis not present

## 2018-07-29 ENCOUNTER — Other Ambulatory Visit: Payer: Self-pay | Admitting: Family Medicine

## 2018-08-09 ENCOUNTER — Other Ambulatory Visit: Payer: Self-pay | Admitting: *Deleted

## 2018-08-09 MED ORDER — ATORVASTATIN CALCIUM 20 MG PO TABS: 20.0000 mg | ORAL_TABLET | Freq: Every day | ORAL | 1 refills | Status: DC

## 2018-08-10 ENCOUNTER — Telehealth: Payer: Self-pay | Admitting: Family Medicine

## 2018-08-10 NOTE — Telephone Encounter (Signed)
Called and spoke to [employer] ( her caregiver) to left her know we was postponing her appt for now due to COVID 19

## 2018-08-11 ENCOUNTER — Other Ambulatory Visit: Payer: Self-pay

## 2018-08-12 MED ORDER — ATORVASTATIN CALCIUM 20 MG PO TABS: 20.0000 mg | ORAL_TABLET | Freq: Every day | ORAL | 1 refills | Status: DC

## 2018-08-16 ENCOUNTER — Ambulatory Visit: Payer: Medicare Other | Admitting: Obstetrics & Gynecology

## 2018-08-17 ENCOUNTER — Other Ambulatory Visit: Payer: Self-pay

## 2018-08-17 ENCOUNTER — Ambulatory Visit (INDEPENDENT_AMBULATORY_CARE_PROVIDER_SITE_OTHER): Payer: Medicare Other | Admitting: *Deleted

## 2018-08-17 DIAGNOSIS — Z3042 Encounter for surveillance of injectable contraceptive: Secondary | ICD-10-CM

## 2018-08-17 MED ORDER — MEDROXYPROGESTERONE ACETATE 150 MG/ML IM SUSY
150.0000 mg | PREFILLED_SYRINGE | Freq: Once | INTRAMUSCULAR | Status: AC
  Administered 2018-08-17: 10:00:00 150 mg via INTRAMUSCULAR

## 2018-08-17 NOTE — Progress Notes (Signed)
Patient here today for Depo Provera injection.  Depo given today LUOQ.  Site unremarkable & patient tolerated injection.  Next injection due June 23 - July 7.  Reminder card given.   Christen Bame, CMA

## 2018-09-07 ENCOUNTER — Telehealth (INDEPENDENT_AMBULATORY_CARE_PROVIDER_SITE_OTHER): Payer: Medicare Other | Admitting: Family Medicine

## 2018-09-07 DIAGNOSIS — N183 Chronic kidney disease, stage 3 (moderate): Secondary | ICD-10-CM | POA: Diagnosis not present

## 2018-09-07 DIAGNOSIS — E1122 Type 2 diabetes mellitus with diabetic chronic kidney disease: Secondary | ICD-10-CM

## 2018-09-07 NOTE — Telephone Encounter (Signed)
[facility] Tele[facility]icine Visit  Patient consented to have virtual visit. Method of visit: Telephone  Encounter participants: Patient: [name] - located at home Provider: Bufford Lope - located at [facility] Others (if applicable): Coralyn Helling caretaker   Chief Complaint: high sugar  HPI:  Patient had cake with chocolate ice cream yesterday evening.  Caretaker took her sugar this morning of 369.  Patient has been acting a Prince sluggish and appears to be nauseous but has not vomited.  She is still taking p.o.  She was a Prince late in taking her medications this morning and took them a few minutes before caretaker called in.  She is breathing normally.  She does not appear acutely confused.  Has not been sick recently  ROS: per HPI  Pertinent PMHx: Schizophrenia, mental retardation, type 2 diabetes with CKD  Exam:   Assessment/Plan:  Type 2 diabetes mellitus with renal manifestations ([place]) Elevated home glucose reading likely in the setting of dietary indiscretion.  Some concern given caretaker's report of patient sluggishness and nausea for the possibility of DKA or HHS.  Hopeful for improvement throughout the day given that patient just took her morning diabetes medications including her sulfonylurea.  Discussed rechecking sugars frequently today and monitoring for symptoms of HHS at home.  Caretaker voiced good understanding and stated she would call back if patient fails to improve.    Time spent during visit with patient: 9 minutes  Bufford Lope, DO PGY-3, [facility] 09/07/2018 9:14 AM

## 2018-09-07 NOTE — Telephone Encounter (Signed)
Medical screening examination/treatment/procedure(s) were performed by a resident. While this patient encounter was not precepted with me during this visit; I was one of the two supervising physician/preceptor available for consultation/collaboration for today.

## 2018-09-07 NOTE — Assessment & Plan Note (Signed)
Elevated home glucose reading likely in the setting of dietary indiscretion.  Some concern given caretaker's report of patient sluggishness and nausea for the possibility of DKA or HHS.  Hopeful for improvement throughout the day given that patient just took her morning diabetes medications including her sulfonylurea.  Discussed rechecking sugars frequently today and monitoring for symptoms of HHS at home.  Caretaker voiced good understanding and stated she would call back if patient fails to improve.

## 2018-09-14 DIAGNOSIS — F250 Schizoaffective disorder, bipolar type: Secondary | ICD-10-CM | POA: Diagnosis not present

## 2018-09-14 DIAGNOSIS — F70 Mild intellectual disabilities: Secondary | ICD-10-CM | POA: Diagnosis not present

## 2018-09-17 ENCOUNTER — Telehealth (INDEPENDENT_AMBULATORY_CARE_PROVIDER_SITE_OTHER): Payer: Medicare Other | Admitting: Family Medicine

## 2018-09-17 ENCOUNTER — Encounter: Payer: Self-pay | Admitting: Family Medicine

## 2018-09-17 ENCOUNTER — Other Ambulatory Visit: Payer: Self-pay

## 2018-09-17 DIAGNOSIS — N183 Chronic kidney disease, stage 3 unspecified: Secondary | ICD-10-CM

## 2018-09-17 DIAGNOSIS — E1122 Type 2 diabetes mellitus with diabetic chronic kidney disease: Secondary | ICD-10-CM | POA: Diagnosis not present

## 2018-09-17 NOTE — Progress Notes (Signed)
[place] Telemedicine Visit  Patient consented to have virtual visit. Method of visit: Video was attempted, but technology challenges prevented patient from using video, so visit was conducted via telephone.  Patient seen in context of COVID-19 pandemic and caregiver preferred virtual visit rather than in-office visit at this time.  Encounter participants: Patient: [name] - located at home Provider: Chrisandra Netters - located at office Others (if applicable): caregiver Jeryl (same first name as patient)  Chief Complaint: f/u on diabetes   HPI:  Diabetes - currently taking glipizide 43m twice daily and januvia 54mdaily. Checking sugars fasting each morning. These are typically running 160-181 on a good day. Highest it's been is 209 or 219. Has been counting carbs (caregiver counts and monitors intake) after doing diabetes nutrition class. Has a sandwich 1-2 times per day and occasionally has sugar free pudding. Also likes pork skins.   Blood on TP - One time last week had a small amount of blood on the tissue after wiping, believes from vagina. Not happened since. Is on depo for menses regulation, last shot 08/17/18.  Bed wetting - No fevers or dysuria. Has been wetting the bed for the last month about 5 out of 7 days. Caregiver thinks this is behavioral, where patient just does not want to get up to go to the bathroom. Does not drink much after 7pm. Caregiver worked with patient years ago and says she was also a bed wetter then so this problem is not new.   ROS: per HPI  Pertinent PMHx: history of schizophrenia, MR, diabetes, acne   Exam:  Respiratory: Patient speaking normally in full sentences throughout the phone encounter, without any respiratory distress evident.   Assessment/Plan:  Type 2 diabetes mellitus with renal manifestations (HCC) Fasting sugars elevated by caregiver report. There is room to improve on the diet. With patient's complex  mental health/cognitive issues, it would be ideal if we could avoid injectable medications like insulin - I think this would be quite disruptive and challenging but is not outside the realm of possibility. Considered SGLT2 but with current urinary symptoms (bedwetting) will hold off on this medication at this time. After discussion with caregiver, elected to have them do more focused carb avoidance and see how sugars go with that. We will follow up via virtual visit in 3 weeks, sooner if needed. Appointment scheduled.   Blood on TP - sounds like one episode of vaginal spotting that self resolved. On depo. Continue to monitor. Advised caregiver if it recurs to let me know.  Bed wetting - not a new issue. No symptoms concerning for UTI at this time. Sounds behavioral. Encouraged better control of diabetes, which should in turn help with having to urinate less overnight. Will touch base on how this is going at follow up visit in 3 wks.   Time spent during visit with patient: 20 minutes

## 2018-09-17 NOTE — Assessment & Plan Note (Signed)
Fasting sugars elevated by caregiver report. There is room to improve on the diet. With patient's complex mental health/cognitive issues, it would be ideal if we could avoid injectable medications like insulin - I think this would be quite disruptive and challenging but is not outside the realm of possibility. Considered SGLT2 but with current urinary symptoms (bedwetting) will hold off on this medication at this time. After discussion with caregiver, elected to have them do more focused carb avoidance and see how sugars go with that. We will follow up via virtual visit in 3 weeks, sooner if needed. Appointment scheduled.

## 2018-10-08 ENCOUNTER — Other Ambulatory Visit: Payer: Self-pay

## 2018-10-08 ENCOUNTER — Telehealth (INDEPENDENT_AMBULATORY_CARE_PROVIDER_SITE_OTHER): Payer: Medicare Other | Admitting: Family Medicine

## 2018-10-08 DIAGNOSIS — N183 Chronic kidney disease, stage 3 unspecified: Secondary | ICD-10-CM

## 2018-10-08 DIAGNOSIS — E1122 Type 2 diabetes mellitus with diabetic chronic kidney disease: Secondary | ICD-10-CM | POA: Diagnosis not present

## 2018-10-08 NOTE — Assessment & Plan Note (Signed)
Sugars much better controlled with diet. Will continue current medications. Requested records from eye exam (Dr. Venetia Maxon, has had this year). Since she is going to come on Monday to have her urine checked, we'll get an A1c at that time.

## 2018-10-08 NOTE — Progress Notes (Signed)
[place] Telemedicine Visit  Patient consented to have virtual visit. Method of visit: Video  Encounter participants: Patient: [name] - located at home Provider: Chrisandra Netters - located at home Others (if applicable): caregiver Weir  Chief Complaint: follow up on diabetes   HPI:  Diabetes - currently taking januvia 67m daily and glipizide 177mtwice daily. Has worked hard on diet control since our last visit several weeks ago. Since changing her diet, sugars are much better controlled now. Sugars in the mornings tend to be in the 130s. Nothing lower than 100. Highest was 180. They are very excited about this.  She's had no more blood on the toilet paper. Has had some odor to her urine. No dysuria or fevers. Has history of malodorous urine in the past, typically thought to be due to hygiene. Caregiver would prefer to have this checked out. Bedwetting is the same as before, unchanged.  ROS: per HPI  Pertinent PMHx: history of diabetes, schizophrenia, MR  Exam:  Respiratory: Patient speaking normally in full sentences throughout the encounter, without any respiratory distress evident.   Assessment/Plan:  Type 2 diabetes mellitus with renal manifestations (HCC) Sugars much better controlled with diet. Will continue current medications. Requested records from eye exam (Dr. WhVenetia Maxonhas had this year). Since she is going to come on Monday to have her urine checked, we'll get an A1c at that time.   Malodorous urine In the past this has been attributed to hygiene Caregiver prefers to bring patient in for urinalysis No signs of infection (no fever, dysuria) Appointment scheduled for next week  Time spent during visit with patient: 10 minutes

## 2018-10-11 ENCOUNTER — Other Ambulatory Visit: Payer: Self-pay

## 2018-10-11 ENCOUNTER — Ambulatory Visit (INDEPENDENT_AMBULATORY_CARE_PROVIDER_SITE_OTHER): Payer: Medicare Other | Admitting: Family Medicine

## 2018-10-11 VITALS — BP 100/60 | HR 107

## 2018-10-11 DIAGNOSIS — E1122 Type 2 diabetes mellitus with diabetic chronic kidney disease: Secondary | ICD-10-CM | POA: Diagnosis not present

## 2018-10-11 DIAGNOSIS — R8290 Unspecified abnormal findings in urine: Secondary | ICD-10-CM

## 2018-10-11 DIAGNOSIS — N183 Chronic kidney disease, stage 3 (moderate): Secondary | ICD-10-CM | POA: Diagnosis not present

## 2018-10-11 LAB — POCT URINALYSIS DIP (MANUAL ENTRY)
Bilirubin, UA: NEGATIVE
Blood, UA: NEGATIVE
Glucose, UA: NEGATIVE mg/dL
Ketones, POC UA: NEGATIVE mg/dL
Nitrite, UA: NEGATIVE
Protein Ur, POC: NEGATIVE mg/dL
Spec Grav, UA: <=1.005 — AB (ref 1.010–1.025)
Urobilinogen, UA: 0.2 E.U./dL
pH, UA: 6 (ref 5.0–8.0)

## 2018-10-11 LAB — POCT GLYCOSYLATED HEMOGLOBIN (HGB A1C): HbA1c, POC (controlled diabetic range): 7.2 % — AB (ref 0.0–7.0)

## 2018-10-11 LAB — POCT UA - MICROSCOPIC ONLY

## 2018-10-11 NOTE — Progress Notes (Signed)
[place]

## 2018-10-11 NOTE — Patient Instructions (Addendum)
Diabetes is well controlled! 7.1. This is awesome.  Urine does not appear infected. Work on good hygiene  Follow up in 3 months by virtual visit (or in office, if preferred)  Be well, [name]

## 2018-10-13 NOTE — Progress Notes (Signed)
Date of Visit: 10/11/2018   HPI:  Patient presents due to urine odor, to have urine checked for signs of infection. No dysuria, no fevers, no abdominal pain or back pain. Does endorse some tightness in her neck.  Also due for a1c today.  ROS: See HPI.  [place]: history of acne, ckd, MR, schizophrenia, SVT, type 2 diabetes   PHYSICAL EXAM: BP 100/60   Pulse (!) 107   SpO2 99%  Gen: no acute distress, pleasant cooperative, well appearing HEENT: normocephalic, atraumatic, full ROM of neck Back: no CVA tenderness ABd: soft, nontender to palpation, no masses or organomegaly Neuro: alert, speech normal  ASSESSMENT/PLAN:  Urine odor UA not convincing for infection Exam benign Suspect hygiene related - encouraged keeping herself clean  Diabetes A1c improved at 7.2. congratulated patient.  Follow up in 3 months for diabetes via virtual or in-office appointment   FOLLOW UP: Follow up in 3 mos for diabetes   [name], [place]  No charge visit as this was essentially a continuous encounter from virtual visit on Friday 5/29.

## 2018-11-02 ENCOUNTER — Ambulatory Visit (INDEPENDENT_AMBULATORY_CARE_PROVIDER_SITE_OTHER): Payer: Medicare Other | Admitting: *Deleted

## 2018-11-02 ENCOUNTER — Other Ambulatory Visit: Payer: Self-pay

## 2018-11-02 DIAGNOSIS — Z3042 Encounter for surveillance of injectable contraceptive: Secondary | ICD-10-CM

## 2018-11-02 DIAGNOSIS — Z79899 Other long term (current) drug therapy: Secondary | ICD-10-CM | POA: Diagnosis not present

## 2018-11-02 MED ORDER — MEDROXYPROGESTERONE ACETATE 150 MG/ML IM SUSY
150.0000 mg | PREFILLED_SYRINGE | Freq: Once | INTRAMUSCULAR | Status: AC
  Administered 2018-11-02: 150 mg via INTRAMUSCULAR

## 2018-11-02 NOTE — Progress Notes (Signed)
Patient here today for Depo Provera injection and is within her dates.    Last contraceptive appt was 12/2017.  Was advised to make next depo appt with D.r Ardelia Mems.  Depo given in [place] today.  Site unremarkable & patient tolerated injection.    Next injection due Sept 8 - Sept 22.  Reminder card given.    Christen Bame, CMA

## 2018-11-09 ENCOUNTER — Other Ambulatory Visit: Payer: Self-pay | Admitting: Family Medicine

## 2018-11-11 NOTE — Telephone Encounter (Signed)
Second request

## 2018-11-25 DIAGNOSIS — R5383 Other fatigue: Secondary | ICD-10-CM | POA: Diagnosis not present

## 2018-11-25 DIAGNOSIS — Z79899 Other long term (current) drug therapy: Secondary | ICD-10-CM | POA: Diagnosis not present

## 2018-12-07 DIAGNOSIS — F70 Mild intellectual disabilities: Secondary | ICD-10-CM | POA: Diagnosis not present

## 2018-12-07 DIAGNOSIS — F5105 Insomnia due to other mental disorder: Secondary | ICD-10-CM | POA: Diagnosis not present

## 2018-12-07 DIAGNOSIS — Z79899 Other long term (current) drug therapy: Secondary | ICD-10-CM | POA: Diagnosis not present

## 2018-12-07 DIAGNOSIS — F250 Schizoaffective disorder, bipolar type: Secondary | ICD-10-CM | POA: Diagnosis not present

## 2018-12-08 ENCOUNTER — Other Ambulatory Visit: Payer: Self-pay | Admitting: Family Medicine

## 2018-12-17 ENCOUNTER — Telehealth: Payer: Self-pay | Admitting: Family Medicine

## 2018-12-17 NOTE — Telephone Encounter (Signed)
FL2 form dropped off at front desk for completion.  Verified that patient section of form has been completed.  Last DOS/WCC with PCP was 10/11/2018.  Placed form in team folder to be completed by clinical staff.  Eldred Manges [place]

## 2018-12-17 NOTE — Telephone Encounter (Signed)
Placed in MDs box to be filled out. [name], CMA

## 2018-12-21 ENCOUNTER — Other Ambulatory Visit: Payer: Self-pay

## 2018-12-21 DIAGNOSIS — R6889 Other general symptoms and signs: Secondary | ICD-10-CM | POA: Diagnosis not present

## 2018-12-21 DIAGNOSIS — Z20822 Contact with and (suspected) exposure to covid-19: Secondary | ICD-10-CM

## 2018-12-21 NOTE — Telephone Encounter (Signed)
Form completed. Printed medication list. Will return to [facility] RN team Leeanne Rio, MD

## 2018-12-21 NOTE — Telephone Encounter (Signed)
Placed in Mail.  Sent to [place] [id number] as requested.   Copy placed in batch scanning. Christen Bame, CMA

## 2018-12-22 LAB — NOVEL CORONAVIRUS, NAA: SARS-CoV-2, NAA: NOT DETECTED

## 2018-12-23 ENCOUNTER — Telehealth: Payer: Self-pay | Admitting: *Deleted

## 2018-12-23 DIAGNOSIS — Z79899 Other long term (current) drug therapy: Secondary | ICD-10-CM | POA: Diagnosis not present

## 2018-12-23 NOTE — Telephone Encounter (Signed)
Requesting results of covid. Christen Bame, CMA

## 2018-12-23 NOTE — Telephone Encounter (Signed)
Called patient's caregiver/caseworker Gerri Spore and  Informed her that patient tested negative for Covid.  [name], [place]

## 2018-12-23 NOTE — Telephone Encounter (Signed)
Red team please inform patient of negative COVID result Leeanne Rio, MD

## 2019-01-11 ENCOUNTER — Other Ambulatory Visit: Payer: Self-pay

## 2019-01-11 MED ORDER — SITAGLIPTIN PHOSPHATE 50 MG PO TABS: ORAL_TABLET | ORAL | 1 refills | Status: DC

## 2019-01-18 ENCOUNTER — Other Ambulatory Visit: Payer: Self-pay

## 2019-01-18 ENCOUNTER — Encounter: Payer: Self-pay | Admitting: Family Medicine

## 2019-01-18 ENCOUNTER — Telehealth: Payer: Self-pay | Admitting: Family Medicine

## 2019-01-18 ENCOUNTER — Ambulatory Visit (INDEPENDENT_AMBULATORY_CARE_PROVIDER_SITE_OTHER): Payer: Medicare Other | Admitting: Family Medicine

## 2019-01-18 VITALS — BP 100/62 | HR 99 | Wt 152.8 lb

## 2019-01-18 DIAGNOSIS — N183 Chronic kidney disease, stage 3 unspecified: Secondary | ICD-10-CM

## 2019-01-18 DIAGNOSIS — Z309 Encounter for contraceptive management, unspecified: Secondary | ICD-10-CM

## 2019-01-18 DIAGNOSIS — E1122 Type 2 diabetes mellitus with diabetic chronic kidney disease: Secondary | ICD-10-CM | POA: Diagnosis not present

## 2019-01-18 DIAGNOSIS — Z23 Encounter for immunization: Secondary | ICD-10-CM

## 2019-01-18 LAB — POCT GLYCOSYLATED HEMOGLOBIN (HGB A1C): HbA1c, POC (controlled diabetic range): 7.2 % — AB (ref 0.0–7.0)

## 2019-01-18 MED ORDER — MEDROXYPROGESTERONE ACETATE 150 MG/ML IM SUSP
150.0000 mg | Freq: Once | INTRAMUSCULAR | Status: AC
  Administered 2019-01-18: 150 mg via INTRAMUSCULAR

## 2019-01-18 NOTE — Patient Instructions (Signed)
Lab visit tomorrow Go to dentist for teeth Watch lip, let me know if worsening  Will complete forms for you to pick up tomorrow  Be well, [name]

## 2019-01-18 NOTE — Progress Notes (Signed)
Date of Visit: 01/18/2019   HPI:  Patient presents for routine physical to be completed for her group home and day program. Overall she is doing well.  Fall - fell on Saturday, tripped at bottom of stairs and faceplanted. No LOC. Was able to walk afterwards. Suffered small lacerations on L knee, R shin. Has black eye. Also cut lip on her teeth. R front tooth on top has pain now. Took tylenol which helped with pain. No seizure activity after falling.  Diabetes - taking januvia 72m daily and glipizide 170mtwice daily. Last eye exam within last year. No low sugars. Walks daily, has lost 10lb since I last saw her.  ROS: See HPI.  [place], acne, MR, schizophrenia, type 2 diabetes   PHYSICAL EXAM: BP 100/62   Pulse 99   Wt 152 lb 12.8 oz (69.3 kg)   SpO2 99%   BMI 25.43 kg/m  Gen: no acute distress, pleasant cooperative HEENT: normocephalic. Echymosis around R eye. pupils equal round and reactive to light. Extraocular movements intact. +lip swelling on lower R lip. Some mildly avulsed tissue inside mouth on R lower lip, healing, no purulence. Normal range of motion of neck Heart: regular rate and rhythm, no murmur Lungs: clear to auscultation bilaterally, normal work of breathing  Neuro: alert, grossly nonfocal Ext: no bony tenderness to either wrist, grip 5/5 bilaterally. Bilateral knees without bony tenderness. Full active extension. Full strenght with knee flexion & extension bilaterally  ASSESSMENT/PLAN:  Health maintenance:  -flu shot given today -request eye records from Dr. WhVenetia MaxonType 2 diabetes mellitus with renal manifestations (HCGarfieldContinued excellent A1c. Continue present medications.  Request eye records   Forms - will complete & have RN team place at front desk  Fall - no bony tenderness today to suggest need for xrays. No LOC or red flags. Lacerations are healing well. I did recommend she see a dentist for the issue with the tooth. Instructed  caregiver to monitor lip swelling and contact me if it worsens.  FOLLOW UP: Follow up in 3 mos for diabetes   BrTanzania. McArdelia MemsMDKing[place]

## 2019-01-18 NOTE — Telephone Encounter (Signed)
Copies made for batch scanning. Original form left up front for pick up.

## 2019-01-18 NOTE — Assessment & Plan Note (Signed)
Continued excellent A1c. Continue present medications.  Request eye records

## 2019-01-18 NOTE — Telephone Encounter (Signed)
Caregiver brought forms to complete during today's visit I have completed forms. They will need to be scanned in and placed at the front desk for caregiver to pick up tomororw morning (patient has lab appointment at 8:30a) Will return forms to [facility] RN team to handle  Thanks Leeanne Rio, MD

## 2019-01-19 ENCOUNTER — Other Ambulatory Visit: Payer: Medicare Other

## 2019-01-19 ENCOUNTER — Other Ambulatory Visit: Payer: Self-pay

## 2019-01-19 DIAGNOSIS — E1122 Type 2 diabetes mellitus with diabetic chronic kidney disease: Secondary | ICD-10-CM

## 2019-01-19 DIAGNOSIS — N183 Chronic kidney disease, stage 3 unspecified: Secondary | ICD-10-CM

## 2019-01-20 DIAGNOSIS — Z79899 Other long term (current) drug therapy: Secondary | ICD-10-CM | POA: Diagnosis not present

## 2019-01-20 LAB — LIPID PANEL
Chol/HDL Ratio: 2.6 ratio (ref 0.0–4.4)
Cholesterol, Total: 84 mg/dL — ABNORMAL LOW (ref 100–199)
HDL: 32 mg/dL — ABNORMAL LOW (ref >39)
LDL Chol Calc (NIH): 33 mg/dL (ref 0–99)
Triglycerides: 98 mg/dL (ref 0–149)
VLDL Cholesterol Cal: 19 mg/dL (ref 5–40)

## 2019-01-20 LAB — CMP14+EGFR
ALT: 21 IU/L (ref 0–32)
AST: 19 IU/L (ref 0–40)
Albumin/Globulin Ratio: 1.1 — ABNORMAL LOW (ref 1.2–2.2)
Albumin: 3.4 g/dL — ABNORMAL LOW (ref 3.8–4.8)
Alkaline Phosphatase: 73 IU/L (ref 39–117)
BUN/Creatinine Ratio: 11 (ref 9–23)
BUN: 19 mg/dL (ref 6–24)
Bilirubin Total: 0.3 mg/dL (ref 0.0–1.2)
CO2: 20 mmol/L (ref 20–29)
Calcium: 9.3 mg/dL (ref 8.7–10.2)
Chloride: 114 mmol/L — ABNORMAL HIGH (ref 96–106)
Creatinine, Ser: 1.74 mg/dL — ABNORMAL HIGH (ref 0.57–1.00)
GFR calc Af Amer: 40 mL/min/{1.73_m2} — ABNORMAL LOW (ref >59)
GFR calc non Af Amer: 35 mL/min/{1.73_m2} — ABNORMAL LOW (ref >59)
Globulin, Total: 3 g/dL (ref 1.5–4.5)
Glucose: 201 mg/dL — ABNORMAL HIGH (ref 65–99)
Potassium: 4.6 mmol/L (ref 3.5–5.2)
Sodium: 148 mmol/L — ABNORMAL HIGH (ref 134–144)
Total Protein: 6.4 g/dL (ref 6.0–8.5)

## 2019-01-28 ENCOUNTER — Other Ambulatory Visit: Payer: Self-pay | Admitting: Family Medicine

## 2019-01-28 ENCOUNTER — Encounter: Payer: Self-pay | Admitting: Family Medicine

## 2019-01-28 DIAGNOSIS — N183 Chronic kidney disease, stage 3 unspecified: Secondary | ICD-10-CM

## 2019-02-10 ENCOUNTER — Other Ambulatory Visit: Payer: Self-pay | Admitting: *Deleted

## 2019-02-10 DIAGNOSIS — Z79899 Other long term (current) drug therapy: Secondary | ICD-10-CM | POA: Diagnosis not present

## 2019-02-10 MED ORDER — SITAGLIPTIN PHOSPHATE 50 MG PO TABS: ORAL_TABLET | ORAL | 3 refills | Status: DC

## 2019-03-22 DIAGNOSIS — Z79899 Other long term (current) drug therapy: Secondary | ICD-10-CM | POA: Diagnosis not present

## 2019-04-05 ENCOUNTER — Ambulatory Visit (INDEPENDENT_AMBULATORY_CARE_PROVIDER_SITE_OTHER): Payer: Medicare Other | Admitting: *Deleted

## 2019-04-05 ENCOUNTER — Other Ambulatory Visit: Payer: Self-pay

## 2019-04-05 DIAGNOSIS — Z309 Encounter for contraceptive management, unspecified: Secondary | ICD-10-CM

## 2019-04-05 MED ORDER — MEDROXYPROGESTERONE ACETATE 150 MG/ML IM SUSY
150.0000 mg | PREFILLED_SYRINGE | Freq: Once | INTRAMUSCULAR | Status: AC
  Administered 2019-04-05: 13:00:00 150 mg via INTRAMUSCULAR

## 2019-04-05 NOTE — Progress Notes (Signed)
Patient here today for Depo Provera injection and is within her dates.    Pt visits PCP regularly (every 3 months), will forward to her to advise when next "contraceptive appt" should be.  Depo given in [place] today.  Site unremarkable & patient tolerated injection.    Next injection due Feb 9, 21 - Feb 23, 21.  Reminder card given.    Christen Bame, CMA

## 2019-04-09 ENCOUNTER — Other Ambulatory Visit: Payer: Self-pay | Admitting: Family Medicine

## 2019-04-14 DIAGNOSIS — Z79899 Other long term (current) drug therapy: Secondary | ICD-10-CM | POA: Diagnosis not present

## 2019-05-04 ENCOUNTER — Other Ambulatory Visit: Payer: Self-pay | Admitting: Family Medicine

## 2019-05-04 DIAGNOSIS — F5105 Insomnia due to other mental disorder: Secondary | ICD-10-CM | POA: Diagnosis not present

## 2019-05-04 DIAGNOSIS — F250 Schizoaffective disorder, bipolar type: Secondary | ICD-10-CM | POA: Diagnosis not present

## 2019-05-04 DIAGNOSIS — F70 Mild intellectual disabilities: Secondary | ICD-10-CM | POA: Diagnosis not present

## 2019-05-04 NOTE — Telephone Encounter (Signed)
Please ask patient to schedule follow up visit with me, thanks Leeanne Rio, MD

## 2019-05-09 NOTE — Telephone Encounter (Signed)
Attempted to call patient to schedule an appointment.  There is no answer and no ability to leave a voice message.  Will try again later.  Ozella Almond, [place]

## 2019-05-11 NOTE — Telephone Encounter (Signed)
Attempted to get in contact with pt no answer. [name] Holter, CMA

## 2019-05-11 NOTE — Telephone Encounter (Signed)
Attempted to call patient back. LVM for patient to return phone call.   Talbot Grumbling, RN

## 2019-05-16 NOTE — Telephone Encounter (Signed)
Pt has an appt on 05/27/2019. Pt only has 2 pills left. Can we send enough medication to get patient through until the appt? Ottis Stain, CMA d

## 2019-05-26 DIAGNOSIS — Z79899 Other long term (current) drug therapy: Secondary | ICD-10-CM | POA: Diagnosis not present

## 2019-05-27 ENCOUNTER — Ambulatory Visit (INDEPENDENT_AMBULATORY_CARE_PROVIDER_SITE_OTHER): Payer: Medicare Other | Admitting: Family Medicine

## 2019-05-27 ENCOUNTER — Other Ambulatory Visit: Payer: Self-pay

## 2019-05-27 VITALS — BP 115/80 | HR 101 | Temp 98.4°F | Wt 157.0 lb

## 2019-05-27 DIAGNOSIS — R351 Nocturia: Secondary | ICD-10-CM | POA: Diagnosis not present

## 2019-05-27 DIAGNOSIS — E1122 Type 2 diabetes mellitus with diabetic chronic kidney disease: Secondary | ICD-10-CM

## 2019-05-27 DIAGNOSIS — Z3042 Encounter for surveillance of injectable contraceptive: Secondary | ICD-10-CM | POA: Diagnosis not present

## 2019-05-27 DIAGNOSIS — E1140 Type 2 diabetes mellitus with diabetic neuropathy, unspecified: Secondary | ICD-10-CM | POA: Insufficient documentation

## 2019-05-27 DIAGNOSIS — N3944 Nocturnal enuresis: Secondary | ICD-10-CM

## 2019-05-27 LAB — POCT GLYCOSYLATED HEMOGLOBIN (HGB A1C): HbA1c, POC (controlled diabetic range): 7.2 % — AB (ref 0.0–7.0)

## 2019-05-27 LAB — POCT URINALYSIS DIP (MANUAL ENTRY)
Bilirubin, UA: NEGATIVE
Blood, UA: NEGATIVE
Glucose, UA: NEGATIVE mg/dL
Ketones, POC UA: NEGATIVE mg/dL
Nitrite, UA: NEGATIVE
Protein Ur, POC: NEGATIVE mg/dL
Spec Grav, UA: 1.01 (ref 1.010–1.025)
Urobilinogen, UA: 0.2 E.U./dL
pH, UA: 6 (ref 5.0–8.0)

## 2019-05-27 LAB — POCT UA - MICROSCOPIC ONLY

## 2019-05-27 MED ORDER — INCONTINENCE BRIEF MEDIUM MISC: 5 refills | Status: DC

## 2019-05-27 NOTE — Patient Instructions (Signed)
Wear incontinence brief every night  Stay on current medications  Schedule with OB/GYN office for pap smear  Have eye records sent to [place] after her eye exam  Check feet every day, apply lotion every day to feet  Next visit in 3 months for regular care  Be well, [name]

## 2019-05-27 NOTE — Progress Notes (Signed)
a1c

## 2019-05-27 NOTE — Assessment & Plan Note (Signed)
Doing well on depo. Continue q3 month injections

## 2019-05-27 NOTE — Progress Notes (Signed)
Date of Visit: 05/27/2019   HPI:  [name] presents today for medication refill.  Diabetes - taking glipizide 67m twice daily and januvia 545mdaily. Tolerating well. Sugars run high 100s - low 200s fasting. A1c today 7.2. has upcoming eye exam scheduled  Contraception - on depo, enjoying this method of contraception. Very occasional spotting but overall doing well and none recently. No periods.  Nocturnal urinary incontinence - ongoing for months to years, but worsened to the point that she urinates in bed now every night. Denies dysuria. Caregiver has obtained incontinence briefs for her and would like an rx. Caregiver believes this is largely behavioral. Did have recent adjustment to her medications - psychiatrist added zolpidem 1027mt bedtime to help her sleep.  ROS: See HPI.  PMFSeco Minesistory of type 2 diabetes, schizophrenia, MR, chronic constipation, CKD3, SVT  PHYSICAL EXAM: BP 115/80   Pulse (!) 101   Temp 98.4 F (36.9 C)   Wt 157 lb (71.2 kg)   SpO2 100%   BMI 26.13 kg/m  Gen: no acute distress, pleasant cooperative Heart: regular rate and rhythm, no murmur Lungs: normal work of breathing  Neuro: alert, speech normal Diabetic foot exam: 2+ DP pulses bilat, decreased sensation with monofilament testing bilaterally. No lesions or significant calluses. Dry skin present.  ASSESSMENT/PLAN:  Health maintenance:  -patient to schedule with OB/GYN for pap smear - has required infant speculum in the past -will request eye exam records Dr. WhiVenetia Maxoniabetic neuropathy, type II diabetes mellitus (HCCHillsboroiminished sensation today with monofilament exam Discussed foot care, check feet daily for signs of cuts/infection  Type 2 diabetes mellitus with renal manifestations (HCCWall1c well controlled today at 7.2. continue current regimen.  Contraception management Doing well on depo. Continue q3 month injections  Incontinence of urine Nocturnal incontinence, persistent. Suspect  related to known cognitive delay and schizophrenia. UA not suggestive of UTI, and this is a chronic issue. rx for incontinence briefs given.  FOLLOW UP: Follow up in 3 mos for next A1c Schedule with GYN for pap  [name]

## 2019-05-27 NOTE — Assessment & Plan Note (Signed)
Nocturnal incontinence, persistent. Suspect related to known cognitive delay and schizophrenia. UA not suggestive of UTI, and this is a chronic issue. rx for incontinence briefs given.

## 2019-05-27 NOTE — Assessment & Plan Note (Signed)
A1c well controlled today at 7.2. continue current regimen.

## 2019-05-27 NOTE — Assessment & Plan Note (Signed)
Diminished sensation today with monofilament exam Discussed foot care, check feet daily for signs of cuts/infection

## 2019-05-28 LAB — BASIC METABOLIC PANEL
BUN/Creatinine Ratio: 15 (ref 9–23)
BUN: 24 mg/dL (ref 6–24)
CO2: 21 mmol/L (ref 20–29)
Calcium: 9.3 mg/dL (ref 8.7–10.2)
Chloride: 108 mmol/L — ABNORMAL HIGH (ref 96–106)
Creatinine, Ser: 1.65 mg/dL — ABNORMAL HIGH (ref 0.57–1.00)
GFR calc Af Amer: 43 mL/min/{1.73_m2} — ABNORMAL LOW (ref >59)
GFR calc non Af Amer: 37 mL/min/{1.73_m2} — ABNORMAL LOW (ref >59)
Glucose: 171 mg/dL — ABNORMAL HIGH (ref 65–99)
Potassium: 4.3 mmol/L (ref 3.5–5.2)
Sodium: 141 mmol/L (ref 134–144)

## 2019-05-30 ENCOUNTER — Encounter: Payer: Self-pay | Admitting: Family Medicine

## 2019-05-31 ENCOUNTER — Other Ambulatory Visit: Payer: Self-pay | Admitting: Family Medicine

## 2019-05-31 ENCOUNTER — Telehealth: Payer: Self-pay

## 2019-05-31 NOTE — Telephone Encounter (Signed)
[name], with patients group home, calls nurse line requesting a doctors note for patient to receive the Covid vaccine. Patients group home is receiving the vaccine for residents and they need a note stating its safe for patient to receive. Please advise.

## 2019-06-01 ENCOUNTER — Encounter: Payer: Self-pay | Admitting: Family Medicine

## 2019-06-01 ENCOUNTER — Other Ambulatory Visit: Payer: Self-pay

## 2019-06-01 ENCOUNTER — Ambulatory Visit (INDEPENDENT_AMBULATORY_CARE_PROVIDER_SITE_OTHER): Payer: Medicare Other | Admitting: Family Medicine

## 2019-06-01 ENCOUNTER — Other Ambulatory Visit (HOSPITAL_COMMUNITY)
Admission: RE | Admit: 2019-06-01 | Discharge: 2019-06-01 | Disposition: A | Discharge status: Home / Self Care | Payer: Medicare Other | Source: Ambulatory Visit | Attending: Family Medicine | Admitting: Family Medicine

## 2019-06-01 VITALS — BP 122/80 | HR 104 | Wt 155.0 lb

## 2019-06-01 DIAGNOSIS — Z1151 Encounter for screening for human papillomavirus (HPV): Secondary | ICD-10-CM | POA: Diagnosis not present

## 2019-06-01 DIAGNOSIS — R87610 Atypical squamous cells of undetermined significance on cytologic smear of cervix (ASC-US): Secondary | ICD-10-CM | POA: Insufficient documentation

## 2019-06-01 DIAGNOSIS — Z124 Encounter for screening for malignant neoplasm of cervix: Secondary | ICD-10-CM

## 2019-06-01 DIAGNOSIS — Z01419 Encounter for gynecological examination (general) (routine) without abnormal findings: Secondary | ICD-10-CM | POA: Diagnosis not present

## 2019-06-01 DIAGNOSIS — Z1231 Encounter for screening mammogram for malignant neoplasm of breast: Secondary | ICD-10-CM

## 2019-06-01 NOTE — Telephone Encounter (Signed)
Spoke to caregiver [name] at group home and informed her of letter for pick up.  [name] states that the Glipizide and the polyethylene glycol need prior authorization.  I informed her that the form needed to be faxed to [facility] and provided her with the fax number.  Ozella Almond, [place]

## 2019-06-01 NOTE — Patient Instructions (Signed)
 Preventive Care 21-46 Years Old, Female Preventive care refers to visits with your health care provider and lifestyle choices that can promote health and wellness. This includes:  A yearly physical exam. This may also be called an annual well check.  Regular dental visits and eye exams.  Immunizations.  Screening for certain conditions.  Healthy lifestyle choices, such as eating a healthy diet, getting regular exercise, not using drugs or products that contain nicotine and tobacco, and limiting alcohol use. What can I expect for my preventive care visit? Physical exam Your health care provider will check your:  Height and weight. This may be used to calculate body mass index (BMI), which tells if you are at a healthy weight.  Heart rate and blood pressure.  Skin for abnormal spots. Counseling Your health care provider may ask you questions about your:  Alcohol, tobacco, and drug use.  Emotional well-being.  Home and relationship well-being.  Sexual activity.  Eating habits.  Work and work environment.  Method of birth control.  Menstrual cycle.  Pregnancy history. What immunizations do I need?  Influenza (flu) vaccine  This is recommended every year. Tetanus, diphtheria, and pertussis (Tdap) vaccine  You may need a Td booster every 10 years. Varicella (chickenpox) vaccine  You may need this if you have not been vaccinated. Human papillomavirus (HPV) vaccine  If recommended by your health care provider, you may need three doses over 6 months. Measles, mumps, and rubella (MMR) vaccine  You may need at least one dose of MMR. You may also need a second dose. Meningococcal conjugate (MenACWY) vaccine  One dose is recommended if you are age 19-21 years and a first-year college student living in a residence hall, or if you have one of several medical conditions. You may also need additional booster doses. Pneumococcal conjugate (PCV13) vaccine  You may need  this if you have certain conditions and were not previously vaccinated. Pneumococcal polysaccharide (PPSV23) vaccine  You may need one or two doses if you smoke cigarettes or if you have certain conditions. Hepatitis A vaccine  You may need this if you have certain conditions or if you travel or work in places where you may be exposed to hepatitis A. Hepatitis B vaccine  You may need this if you have certain conditions or if you travel or work in places where you may be exposed to hepatitis B. Haemophilus influenzae type b (Hib) vaccine  You may need this if you have certain conditions. You may receive vaccines as individual doses or as more than one vaccine together in one shot (combination vaccines). Talk with your health care provider about the risks and benefits of combination vaccines. What tests do I need?  Blood tests  Lipid and cholesterol levels. These may be checked every 5 years starting at age 20.  Hepatitis C test.  Hepatitis B test. Screening  Diabetes screening. This is done by checking your blood sugar (glucose) after you have not eaten for a while (fasting).  Sexually transmitted disease (STD) testing.  BRCA-related cancer screening. This may be done if you have a family history of breast, ovarian, tubal, or peritoneal cancers.  Pelvic exam and Pap test. This may be done every 3 years starting at age 21. Starting at age 30, this may be done every 5 years if you have a Pap test in combination with an HPV test. Talk with your health care provider about your test results, treatment options, and if necessary, the need for more   tests. Follow these instructions at home: Eating and drinking   Eat a diet that includes fresh fruits and vegetables, whole grains, lean protein, and low-fat dairy.  Take vitamin and mineral supplements as recommended by your health care provider.  Do not drink alcohol if: ? Your health care provider tells you not to drink. ? You are  pregnant, may be pregnant, or are planning to become pregnant.  If you drink alcohol: ? Limit how much you have to 0-1 drink a day. ? Be aware of how much alcohol is in your drink. In the [place] one drink equals one 12 oz bottle of beer (355 mL), one 5 oz glass of wine (148 mL), or one 1 oz glass of hard liquor (44 mL). Lifestyle  Take daily care of your teeth and gums.  Stay active. Exercise for at least 30 minutes on 5 or more days each week.  Do not use any products that contain nicotine or tobacco, such as cigarettes, e-cigarettes, and chewing tobacco. If you need help quitting, ask your health care provider.  If you are sexually active, practice safe sex. Use a condom or other form of birth control (contraception) in order to prevent pregnancy and STIs (sexually transmitted infections). If you plan to become pregnant, see your health care provider for a preconception visit. What's next?  Visit your health care provider once a year for a well check visit.  Ask your health care provider how often you should have your eyes and teeth checked.  Stay up to date on all vaccines. This information is not intended to replace advice given to you by your health care provider. Make sure you discuss any questions you have with your health care provider. Document Revised: 01/07/2018 Document Reviewed: 01/07/2018 Elsevier Patient Education  2020 [employer].  

## 2019-06-01 NOTE — Telephone Encounter (Signed)
Letter written, will place at front desk for caregiver to pick up. Please inform caregiver, thanks Leeanne Rio, MD

## 2019-06-01 NOTE — Progress Notes (Signed)
  Subjective:     [name] is a 44 y.o. female and is here for a comprehensive physical exam. The patient reports no problems. This is not her first pap smear. She is accompanied by her care giver. She has no cycle on Depo.  The following portions of the patient's history were reviewed and updated as appropriate: allergies, current medications, past family history, past medical history, past social history, past surgical history and problem list.  Review of Systems Pertinent items noted in HPI and remainder of comprehensive ROS otherwise negative.   Objective:    BP 122/80   Pulse (!) 104   Wt 155 lb (70.3 kg)   BMI 25.79 kg/m  General appearance: alert, cooperative and appears stated age Head: Normocephalic, without obvious abnormality, atraumatic Neck: no adenopathy, supple, symmetrical, trachea midline and thyroid not enlarged, symmetric, no tenderness/mass/nodules Lungs: clear to auscultation bilaterally Breasts: normal appearance, no masses or tenderness Heart: regular rate and rhythm, S1, S2 normal, no murmur, click, rub or gallop Abdomen: soft, non-tender; bowel sounds normal; no masses,  no organomegaly Pelvic: cervix normal in appearance, external genitalia normal, no adnexal masses or tenderness, no cervical motion tenderness, uterus normal size, shape, and consistency and vagina normal without discharge Extremities: extremities normal, atraumatic, no cyanosis or edema Pulses: 2+ and symmetric Skin: Skin color, texture, turgor normal. No rashes or lesions Lymph nodes: Cervical, supraclavicular, and axillary nodes normal. Neurologic: Grossly normal    Assessment:    Healthy female exam.      Plan:      Problem List Items Addressed This Visit    None    Visit Diagnoses    Encounter for screening mammogram for malignant neoplasm of breast    -  Primary   Relevant Orders   MM Digital Screening   Screening for malignant neoplasm of cervix       Relevant Orders    Cytology - PAP( [place])   Encounter for gynecological examination without abnormal finding         Return in 1 year (on 05/31/2020) for a CPE, schedule mammogram.   See After Visit Summary for Counseling Recommendations

## 2019-06-02 DIAGNOSIS — Z79899 Other long term (current) drug therapy: Secondary | ICD-10-CM | POA: Diagnosis not present

## 2019-06-03 ENCOUNTER — Other Ambulatory Visit: Payer: Self-pay

## 2019-06-03 MED ORDER — POLYETHYLENE GLYCOL 3350 17 GM/SCOOP PO POWD: ORAL | 3 refills | Status: DC

## 2019-06-03 MED ORDER — GLIPIZIDE 10 MG PO TABS: ORAL_TABLET | ORAL | 1 refills | Status: DC

## 2019-06-03 NOTE — Telephone Encounter (Addendum)
Faye from patient's group home called x 3 to find out if patient could get medication refill for Glipizide and powered laxative.  She states that she has been to the pharmacy on several occassions to pick it up to no avail and pharmacy states that they sent request to [facility].  Deborah Chalk patient's caregiver at the group home and informed her that RX's have been filled by Dr. Owens Shark as I could not reach [name] and reorder for RX's were pended from 05/27/2019.  Ozella Almond, [place]

## 2019-06-07 ENCOUNTER — Telehealth (INDEPENDENT_AMBULATORY_CARE_PROVIDER_SITE_OTHER): Payer: Medicare Other | Admitting: Lactation Services

## 2019-06-07 DIAGNOSIS — R87612 Low grade squamous intraepithelial lesion on cytologic smear of cervix (LGSIL): Secondary | ICD-10-CM

## 2019-06-07 LAB — CYTOLOGY - PAP
Comment: NEGATIVE
Diagnosis: UNDETERMINED — AB
High risk HPV: NEGATIVE

## 2019-06-07 NOTE — Telephone Encounter (Signed)
Pt called to notify her of Pap results and need for follow up in 3 years. Spoke with her provider as pt. Is unable to speak on the phone, provider voiced understanding with no questions at this time.

## 2019-06-07 NOTE — Telephone Encounter (Signed)
-----   Message from Donnamae Jude, MD sent at 06/07/2019  1:56 PM EST ----- Pap is slightly abnormal, but HPV is negative--needs another pap in 3 years.

## 2019-06-14 ENCOUNTER — Encounter: Payer: Self-pay | Admitting: Family Medicine

## 2019-06-14 ENCOUNTER — Telehealth: Payer: Self-pay

## 2019-06-14 NOTE — Telephone Encounter (Signed)
[name], patients caregiver, calls nurse line stating they need an updated letter changing patients Depakote admin time. [name] is at daycare now at [place] (original start time,) her daycare can not administer this medication. Depakote now needs to be administered at 8am and 8pm will her other medications. Please advise.

## 2019-06-14 NOTE — Telephone Encounter (Signed)
Letter in letters tab. Please notify caregiver. Thank you   Dorris Singh, MD  [name] Medicine Teaching Service

## 2019-06-15 NOTE — Telephone Encounter (Signed)
Letter placed up front for pick up. [name] contacted via VM.

## 2019-06-22 ENCOUNTER — Encounter: Payer: Self-pay | Admitting: Family Medicine

## 2019-06-22 LAB — HM DIABETES EYE EXAM

## 2019-06-29 ENCOUNTER — Other Ambulatory Visit: Payer: Self-pay | Admitting: Family Medicine

## 2019-07-07 ENCOUNTER — Other Ambulatory Visit: Payer: Self-pay

## 2019-07-07 ENCOUNTER — Ambulatory Visit (INDEPENDENT_AMBULATORY_CARE_PROVIDER_SITE_OTHER): Payer: Medicare Other

## 2019-07-07 ENCOUNTER — Telehealth: Payer: Self-pay | Admitting: Family Medicine

## 2019-07-07 DIAGNOSIS — Z3042 Encounter for surveillance of injectable contraceptive: Secondary | ICD-10-CM

## 2019-07-07 LAB — POCT URINE PREGNANCY: Preg Test, Ur: NEGATIVE

## 2019-07-07 MED ORDER — MEDROXYPROGESTERONE ACETATE 150 MG/ML IM SUSP
150.0000 mg | Freq: Once | INTRAMUSCULAR | Status: AC
  Administered 2019-07-07: 10:00:00 150 mg via INTRAMUSCULAR

## 2019-07-07 NOTE — Addendum Note (Signed)
Addended by: Londell Moh T on: 07/07/2019 09:47 AM   Modules accepted: Orders

## 2019-07-07 NOTE — Telephone Encounter (Signed)
Reviewed form and placed in PCP's box for completion.  [name], [place]

## 2019-07-07 NOTE — Telephone Encounter (Signed)
The Therapeutic provider presented Standard Physical from to be completed Last visit 07-07-19 Form placed in the red folder

## 2019-07-07 NOTE — Progress Notes (Signed)
Patient here today for Depo Provera injection and is outside her dates.  Pt given a UPT with negative result. Injection given  Will message PCP to ask when pt needs to return for contraception appt.   Depo given in [place]  today.  Site unremarkable & patient tolerated injection.    Next injection due 5/13-5/27.  Reminder card given.    Ottis Stain, CMA

## 2019-07-11 ENCOUNTER — Other Ambulatory Visit: Payer: Self-pay | Admitting: Family Medicine

## 2019-07-12 DIAGNOSIS — F5105 Insomnia due to other mental disorder: Secondary | ICD-10-CM | POA: Diagnosis not present

## 2019-07-12 DIAGNOSIS — F250 Schizoaffective disorder, bipolar type: Secondary | ICD-10-CM | POA: Diagnosis not present

## 2019-07-12 DIAGNOSIS — Z79899 Other long term (current) drug therapy: Secondary | ICD-10-CM | POA: Diagnosis not present

## 2019-07-12 DIAGNOSIS — F70 Mild intellectual disabilities: Secondary | ICD-10-CM | POA: Diagnosis not present

## 2019-07-13 NOTE — Telephone Encounter (Signed)
Informed caregiver her form is ready for pick up.

## 2019-07-13 NOTE — Telephone Encounter (Signed)
Form completed, will return to RN team Leeanne Rio, MD

## 2019-07-19 ENCOUNTER — Other Ambulatory Visit: Payer: Self-pay

## 2019-07-19 ENCOUNTER — Ambulatory Visit
Admission: RE | Admit: 2019-07-19 | Discharge: 2019-07-19 | Disposition: A | Discharge status: Home / Self Care | Payer: Medicare Other | Source: Ambulatory Visit | Attending: Family Medicine | Admitting: Family Medicine

## 2019-07-19 DIAGNOSIS — Z1231 Encounter for screening mammogram for malignant neoplasm of breast: Secondary | ICD-10-CM

## 2019-07-20 ENCOUNTER — Other Ambulatory Visit: Payer: Self-pay | Admitting: Family Medicine

## 2019-07-20 DIAGNOSIS — R928 Other abnormal and inconclusive findings on diagnostic imaging of breast: Secondary | ICD-10-CM

## 2019-07-25 ENCOUNTER — Other Ambulatory Visit: Payer: Self-pay

## 2019-07-25 ENCOUNTER — Ambulatory Visit: Admission: RE | Admit: 2019-07-25 | Payer: Medicare Other | Source: Ambulatory Visit

## 2019-07-25 ENCOUNTER — Ambulatory Visit: Payer: Medicare Other

## 2019-08-03 ENCOUNTER — Other Ambulatory Visit: Payer: Self-pay | Admitting: Family Medicine

## 2019-08-09 ENCOUNTER — Other Ambulatory Visit: Payer: Self-pay

## 2019-08-09 DIAGNOSIS — Z79899 Other long term (current) drug therapy: Secondary | ICD-10-CM | POA: Diagnosis not present

## 2019-08-09 MED ORDER — SITAGLIPTIN PHOSPHATE 50 MG PO TABS: ORAL_TABLET | ORAL | 3 refills | Status: DC

## 2019-08-10 ENCOUNTER — Ambulatory Visit
Admission: RE | Admit: 2019-08-10 | Discharge: 2019-08-10 | Disposition: A | Discharge status: Home / Self Care | Payer: Medicare Other | Source: Ambulatory Visit | Attending: Family Medicine | Admitting: Family Medicine

## 2019-08-10 ENCOUNTER — Other Ambulatory Visit: Payer: Self-pay

## 2019-08-10 DIAGNOSIS — R928 Other abnormal and inconclusive findings on diagnostic imaging of breast: Secondary | ICD-10-CM

## 2019-08-10 DIAGNOSIS — N6041 Mammary duct ectasia of right breast: Secondary | ICD-10-CM | POA: Diagnosis not present

## 2019-08-15 ENCOUNTER — Other Ambulatory Visit: Payer: Self-pay | Admitting: Family Medicine

## 2019-08-16 DIAGNOSIS — F5105 Insomnia due to other mental disorder: Secondary | ICD-10-CM | POA: Diagnosis not present

## 2019-08-16 DIAGNOSIS — F70 Mild intellectual disabilities: Secondary | ICD-10-CM | POA: Diagnosis not present

## 2019-08-16 DIAGNOSIS — F250 Schizoaffective disorder, bipolar type: Secondary | ICD-10-CM | POA: Diagnosis not present

## 2019-09-02 ENCOUNTER — Other Ambulatory Visit: Payer: Self-pay

## 2019-09-02 MED ORDER — SITAGLIPTIN PHOSPHATE 50 MG PO TABS: ORAL_TABLET | ORAL | 1 refills | Status: DC

## 2019-09-02 NOTE — Telephone Encounter (Signed)
Please ask patient to schedule a follow up visit with me.  Thanks! Leeanne Rio, MD

## 2019-09-02 NOTE — Telephone Encounter (Signed)
Pt scheduled. Darron Stuck Kennon Holter, CMA

## 2019-09-08 DIAGNOSIS — Z79899 Other long term (current) drug therapy: Secondary | ICD-10-CM | POA: Diagnosis not present

## 2019-09-15 DIAGNOSIS — F70 Mild intellectual disabilities: Secondary | ICD-10-CM | POA: Diagnosis not present

## 2019-09-15 DIAGNOSIS — F5105 Insomnia due to other mental disorder: Secondary | ICD-10-CM | POA: Diagnosis not present

## 2019-09-15 DIAGNOSIS — F250 Schizoaffective disorder, bipolar type: Secondary | ICD-10-CM | POA: Diagnosis not present

## 2019-09-27 ENCOUNTER — Other Ambulatory Visit: Payer: Self-pay

## 2019-09-27 ENCOUNTER — Ambulatory Visit (INDEPENDENT_AMBULATORY_CARE_PROVIDER_SITE_OTHER): Payer: Medicare Other | Admitting: Family Medicine

## 2019-09-27 VITALS — BP 120/80 | HR 101 | Ht 64.76 in | Wt 154.4 lb

## 2019-09-27 DIAGNOSIS — N3944 Nocturnal enuresis: Secondary | ICD-10-CM | POA: Diagnosis not present

## 2019-09-27 DIAGNOSIS — Z3042 Encounter for surveillance of injectable contraceptive: Secondary | ICD-10-CM | POA: Diagnosis not present

## 2019-09-27 DIAGNOSIS — E1140 Type 2 diabetes mellitus with diabetic neuropathy, unspecified: Secondary | ICD-10-CM

## 2019-09-27 DIAGNOSIS — E1122 Type 2 diabetes mellitus with diabetic chronic kidney disease: Secondary | ICD-10-CM | POA: Diagnosis not present

## 2019-09-27 DIAGNOSIS — L700 Acne vulgaris: Secondary | ICD-10-CM | POA: Diagnosis not present

## 2019-09-27 LAB — POCT URINE PREGNANCY: Preg Test, Ur: NEGATIVE

## 2019-09-27 LAB — POCT GLYCOSYLATED HEMOGLOBIN (HGB A1C): HbA1c, POC (controlled diabetic range): 6.8 % (ref 0.0–7.0)

## 2019-09-27 MED ORDER — MEDROXYPROGESTERONE ACETATE 150 MG/ML IM SUSY
150.0000 mg | PREFILLED_SYRINGE | Freq: Once | INTRAMUSCULAR | Status: AC
  Administered 2019-09-27: 150 mg via INTRAMUSCULAR

## 2019-09-27 NOTE — Patient Instructions (Signed)
Stop clindamycin - skin looks great  Stay on current medications Follow up in 3 months   Be well, [name]

## 2019-09-27 NOTE — Assessment & Plan Note (Signed)
A1c excellent today at 6.8. continue current medication regimen. Will request eye records  Follow up 3 months

## 2019-09-27 NOTE — Progress Notes (Signed)
Patient presents for Depo shot. UPT given and was negative.  Injection given in [facility].  Site unremarkable and patient tolerated it well.  Reminder card given for next shot Aug 3rd-17th.  [name], [place]

## 2019-09-27 NOTE — Assessment & Plan Note (Signed)
Very well controlled. Will stop clindamycin. Can consider restarting if recurs.

## 2019-09-27 NOTE — Progress Notes (Signed)
  Date of Visit: 09/27/2019   SUBJECTIVE:   HPI:  [name] presents today for routine follow up, accompanied by her caregiver who provides much of the history.   Diabetes - currently taking glipizide 84m twice daily, januvia 545mdaily. Tolerating well. Fasting sugars are improving, running 160s-170s. Had been sneaking some sweets but caregiver has addressed this. Due for A1c today. Had eye exam in the last few months at Dr. WhLenon Omsffice.  Acne - medicaid recently stopped covering her clindamycin. No recent acne breakouts.   Urinary incontinence - doing well with nightly incontinence briefs. Continue these.  Caregiver also notes she will need a physical form completed soon.  OBJECTIVE:   BP 120/80   Pulse (!) 101   Ht 5' 4.76" (1.645 m)   Wt 154 lb 6.4 oz (70 kg)   SpO2 99%   BMI 25.88 kg/m  Gen: no acute distress, pleasant, cooperative HEENT: normocephalic, atraumatic. No anterior cervical or supraclavicular lymphadenopathy.  Heart: regular rate and rhythm, no murmur Lungs: clear to auscultation bilaterally, normal work of breathing  Abdomen: soft, nontender to palpation, no masses Neuro: alert, speech normal, grossly nonfocal Ext: No appreciable lower extremity edema bilaterally   ASSESSMENT/PLAN:   Health maintenance:  -is already fully vaccinated against COVID -will request eye records from Dr. WhVenetia Maxonotherwise current on HM items  Will complete physical form when caregiver drops it off  Acne Very well controlled. Will stop clindamycin. Can consider restarting if recurs.  Diabetic neuropathy, type II diabetes mellitus ([place]1c excellent today at 6.8. continue current medication regimen. Will request eye records  Follow up 3 months   Incontinence of urine Doing well with nocturnal depends. Continue these.  FOLLOW UP: Follow up in 3 mos for diabetes   [name]. McArdelia MemsMDEaton [place]

## 2019-09-27 NOTE — Assessment & Plan Note (Signed)
Doing well with nocturnal depends. Continue these.

## 2019-09-28 ENCOUNTER — Ambulatory Visit: Payer: Medicare Other

## 2019-10-13 DIAGNOSIS — Z79899 Other long term (current) drug therapy: Secondary | ICD-10-CM | POA: Diagnosis not present

## 2019-10-26 ENCOUNTER — Other Ambulatory Visit: Payer: Self-pay | Admitting: *Deleted

## 2019-10-26 MED ORDER — POLYETHYLENE GLYCOL 3350 17 GM/SCOOP PO POWD: ORAL | 3 refills | Status: DC

## 2019-11-09 ENCOUNTER — Other Ambulatory Visit: Payer: Self-pay

## 2019-11-10 MED ORDER — SITAGLIPTIN PHOSPHATE 50 MG PO TABS: ORAL_TABLET | ORAL | 3 refills | Status: DC

## 2019-11-16 DIAGNOSIS — Z79899 Other long term (current) drug therapy: Secondary | ICD-10-CM | POA: Diagnosis not present

## 2019-12-01 ENCOUNTER — Other Ambulatory Visit: Payer: Self-pay

## 2019-12-05 MED ORDER — GLIPIZIDE 10 MG PO TABS: ORAL_TABLET | ORAL | 1 refills | Status: DC

## 2019-12-06 DIAGNOSIS — Z79899 Other long term (current) drug therapy: Secondary | ICD-10-CM | POA: Diagnosis not present

## 2019-12-12 ENCOUNTER — Telehealth: Payer: Self-pay | Admitting: Family Medicine

## 2019-12-12 NOTE — Telephone Encounter (Signed)
Medical information form dropped off for at front desk for completion.  Verified that patient section of form has been completed.  Last DOS/WCC with PCP was01/15/2021  Placed form in team folder to be completed by clinical staff.  [name]

## 2019-12-13 ENCOUNTER — Other Ambulatory Visit: Payer: Self-pay

## 2019-12-13 ENCOUNTER — Encounter: Payer: Self-pay | Admitting: Family Medicine

## 2019-12-13 ENCOUNTER — Ambulatory Visit (INDEPENDENT_AMBULATORY_CARE_PROVIDER_SITE_OTHER): Payer: Medicare Other | Admitting: Family Medicine

## 2019-12-13 VITALS — BP 110/72 | HR 106 | Wt 149.2 lb

## 2019-12-13 DIAGNOSIS — E1122 Type 2 diabetes mellitus with diabetic chronic kidney disease: Secondary | ICD-10-CM | POA: Diagnosis not present

## 2019-12-13 DIAGNOSIS — Z309 Encounter for contraceptive management, unspecified: Secondary | ICD-10-CM

## 2019-12-13 DIAGNOSIS — Z1159 Encounter for screening for other viral diseases: Secondary | ICD-10-CM | POA: Diagnosis not present

## 2019-12-13 LAB — POCT GLYCOSYLATED HEMOGLOBIN (HGB A1C): HbA1c, POC (controlled diabetic range): 6.8 % (ref 0.0–7.0)

## 2019-12-13 MED ORDER — MEDROXYPROGESTERONE ACETATE 150 MG/ML IM SUSY
150.0000 mg | PREFILLED_SYRINGE | Freq: Once | INTRAMUSCULAR | Status: AC
  Administered 2019-12-13: 150 mg via INTRAMUSCULAR

## 2019-12-13 NOTE — Assessment & Plan Note (Signed)
Well controlled. A1C again at 6.8 today. Continue medication regimine. F/u in 3 months

## 2019-12-13 NOTE — Assessment & Plan Note (Signed)
Stable. Continue nightly incontinence briefs.

## 2019-12-13 NOTE — Progress Notes (Signed)
21 

## 2019-12-13 NOTE — Patient Instructions (Signed)
It was great to see you again today!  Stay on current medications Checking labs today  Follow up in 3 months for next diabetes check  Let us know dates of COVID vaccines  Be well, [name]

## 2019-12-13 NOTE — Assessment & Plan Note (Addendum)
Depo shot administered at this visit. Continue q 3 months schedule.

## 2019-12-13 NOTE — Telephone Encounter (Signed)
Reviewed and placed in PCP's box for completion.  [name], [place]

## 2019-12-13 NOTE — Progress Notes (Signed)
° ° °  SUBJECTIVE:  Patient presents for follow up, accompanied by her caregiver, who provides much of the history.   HPI:   Diabetes-Patient continues on glipizide 10 mg bid and januvia 62m once a day, with no concerns. Fasting sugars have been in the 200s (previously in 160s-170s). Patient is sneaking chips and continues sneaking some sweets, but caregiver is addressing this. A1C at 6.8 today, similar to last check two months ago.   Health maintenance-Patient has received COVID vaccines. Caregiver will bring in card at next appointment.   Acne-Patient has had no concerns regarding acne since the last visit. Skin doing well after stopping topical clindamycin  Urinary Incontinence: Night-time Incontinence at same level since last visit. Patient continues on incontinence briefs.   PERTINENT  PMH / PSH: Schizophrenia, Intellectual Disability, CKD Stage III, Acne, SVT, GERD  OBJECTIVE:   BP 110/72    Pulse (!) 106    Wt 149 lb 3.2 oz (67.7 kg)    SpO2 98%    BMI 25.01 kg/m   General: No acute distress. Pleasant, alert and responsive. CV: Regular rate and rhythm, no murmur Pulmonary: Normal work of breathing. Clear to auscultation bilaterally Neuro: normal speech, grossly non-focal Extremities: No significant lower extremity edema bilaterally  ASSESSMENT/PLAN:   Health Maintenance. -Hep C screen today -check urine microalb:creat ratio today -Patient caregiver will bring COVID-19 vaccination card to next appointment (fully vaccinated against COVID)  Incontinence of urine Stable. Continue nightly incontinence briefs.  Contraception management Depo shot administered at this visit. Continue q 3 months schedule.    Type 2 diabetes mellitus with renal manifestations (HCC) Well controlled. A1C again at 6.8 today. Continue medication regimine. F/u in 3 months   Check renal function today  [place]  Patient seen along  with MS3 student PRomeo Apple I personally evaluated this patient along with the student, and verified all aspects of the history, physical exam, and medical decision making as documented by the student. I agree with the student's documentation and have made all necessary edits.  BChrisandra Netters MD  [place]

## 2019-12-15 LAB — MICROALBUMIN / CREATININE URINE RATIO
Creatinine, Urine: 66 mg/dL
Microalb/Creat Ratio: 69 mg/g creat — ABNORMAL HIGH (ref 0–29)
Microalbumin, Urine: 45.5 ug/mL

## 2019-12-16 ENCOUNTER — Telehealth: Payer: Self-pay | Admitting: Family Medicine

## 2019-12-16 DIAGNOSIS — E872 Acidosis, unspecified: Secondary | ICD-10-CM

## 2019-12-16 NOTE — Telephone Encounter (Signed)
Called and spoke with caregiver [name] about patient's labs. Labs showed CO2 of 15, with an elevated anion gap (likely due to low CO2).  [name] denies that patient has had any abdominal pain, nausea, or vomiting. She is acting normally, eating and drinking well. Sugars have run 150-160. Clinically this is inconsistent with DKA.   Advised to have her hydrate well over the weekend and return Monday for repeat BMET. Discussed reasons to call us (abdominal pain, n/v, AMS).  Caregiver appreciative & agreeable.  Leeanne Rio, MD

## 2019-12-16 NOTE — Telephone Encounter (Signed)
Patient is calling to check the status of forms. Please advise. Thanks

## 2019-12-16 NOTE — Telephone Encounter (Signed)
Form completed Will return to [facility] RN team Please print copy of medication list to give along with forms  Thanks  Leeanne Rio, MD

## 2019-12-17 LAB — HCV AB W REFLEX TO QUANT PCR: HCV Ab: 0.3 s/co ratio (ref 0.0–0.9)

## 2019-12-17 LAB — BASIC METABOLIC PANEL
BUN/Creatinine Ratio: 11 (ref 9–23)
BUN: 18 mg/dL (ref 6–24)
CO2: 15 mmol/L — ABNORMAL LOW (ref 20–29)
Calcium: 9.1 mg/dL (ref 8.7–10.2)
Chloride: 110 mmol/L — ABNORMAL HIGH (ref 96–106)
Creatinine, Ser: 1.66 mg/dL — ABNORMAL HIGH (ref 0.57–1.00)
GFR calc Af Amer: 42 mL/min/{1.73_m2} — ABNORMAL LOW (ref >59)
GFR calc non Af Amer: 37 mL/min/{1.73_m2} — ABNORMAL LOW (ref >59)
Glucose: 322 mg/dL — ABNORMAL HIGH (ref 65–99)
Potassium: 4.6 mmol/L (ref 3.5–5.2)
Sodium: 143 mmol/L (ref 134–144)

## 2019-12-17 LAB — HCV INTERPRETATION

## 2019-12-19 ENCOUNTER — Other Ambulatory Visit: Payer: Self-pay

## 2019-12-19 ENCOUNTER — Other Ambulatory Visit: Payer: Medicare Other

## 2019-12-19 DIAGNOSIS — E872 Acidosis, unspecified: Secondary | ICD-10-CM

## 2019-12-20 ENCOUNTER — Encounter: Payer: Self-pay | Admitting: Family Medicine

## 2019-12-20 LAB — BASIC METABOLIC PANEL
BUN/Creatinine Ratio: 11 (ref 9–23)
BUN: 18 mg/dL (ref 6–24)
CO2: 18 mmol/L — ABNORMAL LOW (ref 20–29)
Calcium: 8.9 mg/dL (ref 8.7–10.2)
Chloride: 109 mmol/L — ABNORMAL HIGH (ref 96–106)
Creatinine, Ser: 1.67 mg/dL — ABNORMAL HIGH (ref 0.57–1.00)
GFR calc Af Amer: 42 mL/min/{1.73_m2} — ABNORMAL LOW (ref >59)
GFR calc non Af Amer: 36 mL/min/{1.73_m2} — ABNORMAL LOW (ref >59)
Glucose: 173 mg/dL — ABNORMAL HIGH (ref 65–99)
Potassium: 4.8 mmol/L (ref 3.5–5.2)
Sodium: 142 mmol/L (ref 134–144)

## 2019-12-20 NOTE — Telephone Encounter (Signed)
Caregiver informed that the form was ready for pickup.  Copy made for batch scanning.  Christen Bame, CMA

## 2019-12-26 ENCOUNTER — Telehealth: Payer: Self-pay | Admitting: Family Medicine

## 2019-12-26 NOTE — Telephone Encounter (Signed)
[facility] FL2 form dropped off for at front desk for completion.  Verified that patient section of form has been completed.  Last DOS/WCC with PCP was 12/13/2019.  Placed form in team folder to be completed by clinical staff.  [name]

## 2019-12-27 NOTE — Telephone Encounter (Signed)
Placed in MDs box to be filled out. Shawn Dannenberg Kennon Holter, CMA

## 2019-12-28 NOTE — Telephone Encounter (Signed)
Unsure why they returned Dr M's original form.  Will leave for her

## 2020-01-02 ENCOUNTER — Other Ambulatory Visit: Payer: Self-pay | Admitting: Family Medicine

## 2020-01-03 NOTE — Telephone Encounter (Signed)
New FL2 completed, will return to [facility] RN team Leeanne Rio, MD

## 2020-01-04 NOTE — Telephone Encounter (Signed)
[name] the patients therapeutic provider is calling to check on the status of FL2 forms. [name] would like someone to call her when they are ready to be picked up at the front desk.  The best call back number is [phone]

## 2020-01-05 NOTE — Telephone Encounter (Signed)
[name] has been contacted and informed the paperwork is ready for pick up. Copy made for batch scanning.

## 2020-01-11 ENCOUNTER — Other Ambulatory Visit: Payer: Self-pay

## 2020-01-12 ENCOUNTER — Ambulatory Visit (INDEPENDENT_AMBULATORY_CARE_PROVIDER_SITE_OTHER): Payer: Medicare Other | Admitting: Family Medicine

## 2020-01-12 ENCOUNTER — Other Ambulatory Visit: Payer: Self-pay

## 2020-01-12 ENCOUNTER — Telehealth: Payer: Self-pay | Admitting: Family Medicine

## 2020-01-12 ENCOUNTER — Encounter: Payer: Self-pay | Admitting: Family Medicine

## 2020-01-12 ENCOUNTER — Ambulatory Visit
Admission: RE | Admit: 2020-01-12 | Discharge: 2020-01-12 | Disposition: A | Discharge status: Home / Self Care | Payer: Medicare Other | Source: Ambulatory Visit | Attending: Family Medicine | Admitting: Family Medicine

## 2020-01-12 VITALS — BP 100/64 | HR 100 | Ht 64.76 in | Wt 147.6 lb

## 2020-01-12 DIAGNOSIS — M25552 Pain in left hip: Secondary | ICD-10-CM | POA: Diagnosis not present

## 2020-01-12 DIAGNOSIS — M25559 Pain in unspecified hip: Secondary | ICD-10-CM

## 2020-01-12 DIAGNOSIS — Z79899 Other long term (current) drug therapy: Secondary | ICD-10-CM | POA: Diagnosis not present

## 2020-01-12 MED ORDER — SITAGLIPTIN PHOSPHATE 50 MG PO TABS: ORAL_TABLET | ORAL | 3 refills | Status: DC

## 2020-01-12 MED ORDER — LIDOCAINE 4 % EX PTCH: MEDICATED_PATCH | CUTANEOUS | 5 refills | Status: DC

## 2020-01-12 NOTE — Progress Notes (Signed)
    SUBJECTIVE:   CHIEF COMPLAINT / HPI:   Hip pain: Left hip pain for about 1 week.  No recent history of fall or injury.  Goes to day program where they walk and excerse.  Also walks along the neighborhood.  Patient endorses pain when she gets up from sitting. No abnormal gait per the patient's caregiver.  Caregiver has been giving her Tylenol but the patient declines it most of the time.  Patient's caregiver states that patient's complaints and symptoms today are much more exaggerated than when they are home.  PERTINENT  PMH / PSH: Cognitive impairment, diabetes, CKD  OBJECTIVE:   BP 100/64   Pulse 100   Ht 5' 4.76" (1.645 m)   Wt 147 lb 9.6 oz (67 kg)   SpO2 100%   BMI 24.74 kg/m   General: Alert.  Mentally handicapped middle-aged female accompanied by her caregiver. CV: Regular rate and rhythm MSK: Patient has very slow rise from sitting position and endorses pain upon standing.  Patient endorses tenderness to palpation along the posterior lateral aspect of the left hip.  No tenderness to palpation of the trochanteric bursa.  Negative straight leg test.  FABER and FADIR test with minimal pain over the posterior lateral aspect of the hip joint.   ASSESSMENT/PLAN:   Hip pain Patient exam shows some tenderness in the left posterior lateral area of the hip.  No spinal tenderness.  No signs of trochanteric bursitis.  No signs of sciatic pain.  Possibly muscular strain or osteoarthritis of the hip, although Corky Sox and FADIR tests were unimpressive.  Advised patient to take the Tylenol when it is offered to her.  Also prescribed lidocaine patch for patient to use daily.  We will get x-rays of the hip to look for arthritis.  Can consider referral to PT if symptoms do not improve.     Benay Pike, MD [place]

## 2020-01-12 NOTE — Telephone Encounter (Signed)
[name] pt's caregiver called requesting the script from today be written an prn medication. [place]

## 2020-01-12 NOTE — Patient Instructions (Signed)
It was nice to meet you today,  I will get a x-ray of your hip to look for your hip pain.  I will let you know the results when I get them.  You should continue to take the Tylenol as needed.  You can take up to 1000 mg 3 times a day.  I have prescribed you a lidocaine patch which you should use once a day.  You can place it in the morning and leave it on during the day.  Have a great day,  Clemetine Marker, MD

## 2020-01-15 DIAGNOSIS — M25559 Pain in unspecified hip: Secondary | ICD-10-CM | POA: Insufficient documentation

## 2020-01-15 NOTE — Assessment & Plan Note (Signed)
Patient exam shows some tenderness in the left posterior lateral area of the hip.  No spinal tenderness.  No signs of trochanteric bursitis.  No signs of sciatic pain.  Possibly muscular strain or osteoarthritis of the hip, although Corky Sox and FADIR tests were unimpressive.  Advised patient to take the Tylenol when it is offered to her.  Also prescribed lidocaine patch for patient to use daily.  We will get x-rays of the hip to look for arthritis.  Can consider referral to PT if symptoms do not improve.

## 2020-01-17 NOTE — Telephone Encounter (Signed)
[name] calls nurse line in regards to lidocaine patches. [name] needs prescription to state PRN for sig. [name] reports she is already not wanting to wear it daily, and needs this updated for legality with group home. [name] can pick up hard copy for her records and she will need the prescription updated for future refills at pharmacy. Please advise.

## 2020-01-18 ENCOUNTER — Other Ambulatory Visit: Payer: Self-pay | Admitting: Family Medicine

## 2020-01-18 MED ORDER — LIDOCAINE 4 % EX PTCH: 1.0000 | MEDICATED_PATCH | Freq: Every day | CUTANEOUS | 5 refills | Status: DC | PRN

## 2020-01-18 NOTE — Telephone Encounter (Signed)
[name] has been contacted and informed.

## 2020-01-18 NOTE — Telephone Encounter (Signed)
There is a hard copy of the order at the front under the patient's name.  They can pick it up at the front desk.

## 2020-02-13 ENCOUNTER — Encounter: Payer: Self-pay | Admitting: Family Medicine

## 2020-02-13 ENCOUNTER — Other Ambulatory Visit: Payer: Self-pay

## 2020-02-13 ENCOUNTER — Ambulatory Visit (INDEPENDENT_AMBULATORY_CARE_PROVIDER_SITE_OTHER): Payer: Medicare Other | Admitting: Family Medicine

## 2020-02-13 ENCOUNTER — Ambulatory Visit
Admission: RE | Admit: 2020-02-13 | Discharge: 2020-02-13 | Disposition: A | Discharge status: Home / Self Care | Payer: Medicare Other | Source: Ambulatory Visit | Attending: Family Medicine | Admitting: Family Medicine

## 2020-02-13 VITALS — BP 112/72 | HR 106 | Wt 144.8 lb

## 2020-02-13 DIAGNOSIS — S76302S Unspecified injury of muscle, fascia and tendon of the posterior muscle group at thigh level, left thigh, sequela: Secondary | ICD-10-CM | POA: Diagnosis not present

## 2020-02-13 DIAGNOSIS — M778 Other enthesopathies, not elsewhere classified: Secondary | ICD-10-CM | POA: Diagnosis not present

## 2020-02-13 DIAGNOSIS — N3944 Nocturnal enuresis: Secondary | ICD-10-CM | POA: Diagnosis not present

## 2020-02-13 DIAGNOSIS — M25552 Pain in left hip: Secondary | ICD-10-CM | POA: Diagnosis not present

## 2020-02-13 DIAGNOSIS — M7918 Myalgia, other site: Secondary | ICD-10-CM

## 2020-02-13 DIAGNOSIS — M76892 Other specified enthesopathies of left lower limb, excluding foot: Secondary | ICD-10-CM | POA: Diagnosis not present

## 2020-02-13 NOTE — Progress Notes (Addendum)
    SUBJECTIVE:   CHIEF COMPLAINT / HPI:   [name] is a pleasant 44 year old woman joined by her caregiver [name]) today for gluteal pain.   The patient reports a 2-3 week history of left buttock pain. She was seen and evaluated by Dr. Jeannine Kitten for this concern in September and underwent a left hip x-ray which was unremarkable. She reports ongoing pain. Her caregiver is concerned for a hemorrhoid or skin breakdown related to her incontinence pads (depends). The patient reports this is not the case. She reports the pain is inferior to the gluteal area. It is worse when sitting and improved by standing. She does not want to take any medications for this. It is worse when she moves her left leg. Denies fevers, chills, skin irritation, or changes in incontinence. She is certain she has not had ANY falls. She is interested in exercises to help with her strength.   Per the patient and caregiver, the patient has a longstanding history with incontinence at night and uses Depends. Denies dysuria or back pain. This has been documented since 2016 but does appear to have increased in January 2021.    PERTINENT  PMH / PSH/Family/Social History : Reviewed.   OBJECTIVE:   BP 112/72   Pulse (!) 106   Wt 144 lb 12.8 oz (65.7 kg)   SpO2 99%   BMI 24.27 kg/m   Cardiac: Regular rate and rhythm. Normal S1/S2. No murmurs, rubs, or gallops appreciated. HR improved on exam.  Lungs: Clear bilaterally to ascultation.  Abdomen: Normoactive bowel sounds. No tenderness to deep or light palpation. No rebound or guarding.  MSK Back with mild curvature, no TTP Chaperoned examination- no hemorrhoids or skin breakdown over rectal area TTP localizes along proximal hamstrings at ischial tuberosity  Negative SLR but pain along hamstrings Negative FABER, positive FADIR  1+ patellar reflexes, downgoing toes    ASSESSMENT/PLAN:   Left Posterior Hip Pain, ongoing issue, still uncertain cause, most consistent with  left hamstrings strain and injury. Also considered lumbar degenerative disc disease but no radiating pain, negative SLR. No identified abscess, skin ulceration, or hemorrhoids (which was of concern given symptoms--but patient specifically points to ischial tuberosity as cause of pain). Offered Tylenol, topical therapy with NSAIDs, patient reports she much prefers PT and ice/heat rather than this. If this condition persists, recommend consideration of lumbar spine/hip MRI pending progression. PT at home ordered. On my review of the imaging, it appears there is asymmetry of the ischial tuberosities but bilateral pelvic not completely evaluated--will obtain more specific imaging of this area. The patient continues to complain of left posterior hip pain likely due to left proximal hamstrings injury as evidenced by clinical history and exam.   Urinary incontinence, unclear if related to fluid consumption, medications. Diabetes is well controlled. DME for incontinence pads to place under Depends (patient's bed getting wet and caregiver would like to reduce burden on sheets/bed, they do have a plastic cover on mattress).   Follow up in 2 weeks to reassess symptoms and consider advanced imaging.   Dorris Singh, [place]

## 2020-02-13 NOTE — Patient Instructions (Addendum)
It was wonderful to see you today.  Please bring ALL of your medications with you to every visit.   Today we talked about:  - Going to get another x-ray of the back of the hip   - Having PT come to your house  -Follow up with [name] in 2 weeks--if this is not improved, I recommend imaging your spine   -- Try Ice for 15 minutes each morning then heat at night  - Tylenol three times per day (1,000 mg) for your pain    Thank you for choosing [facility].   Please call [phone] with any questions about today's appointment.  Please be sure to schedule follow up at the front  desk before you leave today.   Dorris Singh, MD  Family Medicine

## 2020-02-14 ENCOUNTER — Telehealth: Payer: Self-pay | Admitting: Family Medicine

## 2020-02-14 NOTE — Telephone Encounter (Signed)
Called patient and caregiver with results. X-ray showed no bony abnormality. Spoke with both directly.  Pain improved, now only bothersome time is sitting. Exercising, walking no longer bothers her. No falls at home. Have tried Epsom salts with relief.  Reviewed reasons to call and return to care.  Follow up scheduled with PCP.   Dorris Singh, MD  Family Medicine Teaching Service

## 2020-02-17 DIAGNOSIS — Z7982 Long term (current) use of aspirin: Secondary | ICD-10-CM | POA: Diagnosis not present

## 2020-02-17 DIAGNOSIS — E1122 Type 2 diabetes mellitus with diabetic chronic kidney disease: Secondary | ICD-10-CM | POA: Diagnosis not present

## 2020-02-17 DIAGNOSIS — S76312A Strain of muscle, fascia and tendon of the posterior muscle group at thigh level, left thigh, initial encounter: Secondary | ICD-10-CM | POA: Diagnosis not present

## 2020-02-17 DIAGNOSIS — S76812D Strain of other specified muscles, fascia and tendons at thigh level, left thigh, subsequent encounter: Secondary | ICD-10-CM | POA: Diagnosis not present

## 2020-02-17 DIAGNOSIS — N189 Chronic kidney disease, unspecified: Secondary | ICD-10-CM | POA: Diagnosis not present

## 2020-02-17 DIAGNOSIS — R32 Unspecified urinary incontinence: Secondary | ICD-10-CM | POA: Diagnosis not present

## 2020-02-17 DIAGNOSIS — Z7984 Long term (current) use of oral hypoglycemic drugs: Secondary | ICD-10-CM | POA: Diagnosis not present

## 2020-02-21 ENCOUNTER — Telehealth: Payer: Self-pay

## 2020-02-21 DIAGNOSIS — E1122 Type 2 diabetes mellitus with diabetic chronic kidney disease: Secondary | ICD-10-CM | POA: Diagnosis not present

## 2020-02-21 DIAGNOSIS — R32 Unspecified urinary incontinence: Secondary | ICD-10-CM | POA: Diagnosis not present

## 2020-02-21 DIAGNOSIS — Z7984 Long term (current) use of oral hypoglycemic drugs: Secondary | ICD-10-CM | POA: Diagnosis not present

## 2020-02-21 DIAGNOSIS — N189 Chronic kidney disease, unspecified: Secondary | ICD-10-CM | POA: Diagnosis not present

## 2020-02-21 DIAGNOSIS — S76812D Strain of other specified muscles, fascia and tendons at thigh level, left thigh, subsequent encounter: Secondary | ICD-10-CM | POA: Diagnosis not present

## 2020-02-21 DIAGNOSIS — Z7982 Long term (current) use of aspirin: Secondary | ICD-10-CM | POA: Diagnosis not present

## 2020-02-21 NOTE — Telephone Encounter (Signed)
[name], [facility], calls nurse line stating she did patients PT evaluation today. Per eval, the patient does not need any further PT. Patients pain is controlled with movement and patient does not need assistance with this.

## 2020-02-22 DIAGNOSIS — Z79899 Other long term (current) drug therapy: Secondary | ICD-10-CM | POA: Diagnosis not present

## 2020-02-23 DIAGNOSIS — Z7982 Long term (current) use of aspirin: Secondary | ICD-10-CM | POA: Diagnosis not present

## 2020-02-23 DIAGNOSIS — E1122 Type 2 diabetes mellitus with diabetic chronic kidney disease: Secondary | ICD-10-CM | POA: Diagnosis not present

## 2020-02-23 DIAGNOSIS — N189 Chronic kidney disease, unspecified: Secondary | ICD-10-CM | POA: Diagnosis not present

## 2020-02-23 DIAGNOSIS — R32 Unspecified urinary incontinence: Secondary | ICD-10-CM | POA: Diagnosis not present

## 2020-02-23 DIAGNOSIS — Z7984 Long term (current) use of oral hypoglycemic drugs: Secondary | ICD-10-CM | POA: Diagnosis not present

## 2020-02-23 DIAGNOSIS — S76812D Strain of other specified muscles, fascia and tendons at thigh level, left thigh, subsequent encounter: Secondary | ICD-10-CM | POA: Diagnosis not present

## 2020-02-28 ENCOUNTER — Other Ambulatory Visit: Payer: Self-pay

## 2020-02-28 ENCOUNTER — Ambulatory Visit (INDEPENDENT_AMBULATORY_CARE_PROVIDER_SITE_OTHER): Payer: Medicare Other | Admitting: Family Medicine

## 2020-02-28 ENCOUNTER — Encounter: Payer: Self-pay | Admitting: Family Medicine

## 2020-02-28 VITALS — BP 114/72 | HR 103 | Wt 141.8 lb

## 2020-02-28 DIAGNOSIS — Z309 Encounter for contraceptive management, unspecified: Secondary | ICD-10-CM

## 2020-02-28 DIAGNOSIS — M7918 Myalgia, other site: Secondary | ICD-10-CM | POA: Diagnosis not present

## 2020-02-28 DIAGNOSIS — M25559 Pain in unspecified hip: Secondary | ICD-10-CM

## 2020-02-28 DIAGNOSIS — Z23 Encounter for immunization: Secondary | ICD-10-CM | POA: Diagnosis not present

## 2020-02-28 MED ORDER — MEDROXYPROGESTERONE ACETATE 150 MG/ML IM SUSY
150.0000 mg | PREFILLED_SYRINGE | Freq: Once | INTRAMUSCULAR | Status: AC
  Administered 2020-02-28: 150 mg via INTRAMUSCULAR

## 2020-02-28 NOTE — Assessment & Plan Note (Signed)
Lumbar back/ischial tuberosity pain persists now for >6 weeks. eval by physical therapy unremarkable. xrays negative. Patient is not generally one to complain of pain this persistently, so I think this warrants more advanced imaging to assess cause of her symptoms. Differential diagnosis broad and this may just be muscle strain, but need to consider more concerning etiologies like occult fracture, osteomyelitis, malignancy.  Will get MRI lumbar spine and pelvis to assess.  Also check labs today - CBC diff, CMET, sed rate.

## 2020-02-28 NOTE — Patient Instructions (Signed)
Stop lidocaine  Checking labs today  MRI lumbar spine and pelvis ordered - we will call with an appointment  Depo and flu shot today  Follow up with me in 1 month  Be well, [name]

## 2020-02-28 NOTE — Progress Notes (Signed)
  Date of Visit: 02/28/2020   SUBJECTIVE:   HPI:  [name] presents today for follow up of L hip/buttock pain. Accompanied by caregiver [name] who helps provide the history.   Seen by Dr. Jeannine Kitten on 9/2 and then by Dr. Owens Shark on 10/4, each time with pain in L buttock. Had xrays x2 that were negative. Pain located in left buttock directly overlying ischial tuberosity. Worse with sitting, particularly causes issues when riding in car or sitting on couch - she pushes back and arches up to keep from sitting on this area. Per caregiver, is persistent in complaining of pain with sitting in those areas. Interestingly does not have pain with sitting at the dinner table. Has baseline urinary incontinence, unchanged recently. Has been more active lately, doing exercises to help manage weight and diabetes. Pain not as present with standing/exercising, primarily with sitting on soft surfaces (couch, car seat). Patient also endorses some pain in her low back overlying the lumbar spine. Challenging to get more details of the pain due to patient's underlying cognitive impairment. Caregiver expressed concern that pain in buttock could be due to issues in her vulvar or rectal area.  Has tried lidocaine patch, which is not helping so she stopped it. Also had physical therapy visit at home, but was deemed by physical therapy not to require any specific therapies so this has not recurred.  OBJECTIVE:   BP 114/72   Pulse (!) 103   Wt 141 lb 12.8 oz (64.3 kg)   SpO2 94%   BMI 23.77 kg/m  Gen: no acute distress, pleasant, cooperative, actively doing exercises in room HEENT: normocephalic, atraumatic  Lungs: normal work of breathing  Musculoskeletal: tenderness over L buttock direclty overlying ischial tuberosity. Pain with FABER and FADIR (pain in back). Some tenderness of lumbar spine. Full strength and sensation in bilateral lower extremities. Rectal/GU inspection: no hemorrhoids or anal lesions seen. No vulvar  lesions. Chaperone [name] present during exam.   ASSESSMENT/PLAN:   Health maintenance:  -flu shot given today  Hip pain Lumbar back/ischial tuberosity pain persists now for >6 weeks. eval by physical therapy unremarkable. xrays negative. Patient is not generally one to complain of pain this persistently, so I think this warrants more advanced imaging to assess cause of her symptoms. Differential diagnosis broad and this may just be muscle strain, but need to consider more concerning etiologies like occult fracture, osteomyelitis, malignancy.  Will get MRI lumbar spine and pelvis to assess.  Also check labs today - CBC diff, CMET, sed rate.   FOLLOW UP: Follow up in 1 mo for above issues  [name], [place] than 30 minutes were spent on this encounter on the day of service, including pre-visit planning, actual face to face time, coordination of care, and documentation of visit.

## 2020-02-29 ENCOUNTER — Encounter: Payer: Self-pay | Admitting: Family Medicine

## 2020-02-29 LAB — CMP14+EGFR
ALT: 15 IU/L (ref 0–32)
AST: 14 IU/L (ref 0–40)
Albumin/Globulin Ratio: 1.5 (ref 1.2–2.2)
Albumin: 4 g/dL (ref 3.8–4.8)
Alkaline Phosphatase: 87 IU/L (ref 44–121)
BUN/Creatinine Ratio: 9 (ref 9–23)
BUN: 14 mg/dL (ref 6–24)
Bilirubin Total: 0.3 mg/dL (ref 0.0–1.2)
CO2: 19 mmol/L — ABNORMAL LOW (ref 20–29)
Calcium: 9.5 mg/dL (ref 8.7–10.2)
Chloride: 108 mmol/L — ABNORMAL HIGH (ref 96–106)
Creatinine, Ser: 1.52 mg/dL — ABNORMAL HIGH (ref 0.57–1.00)
GFR calc Af Amer: 47 mL/min/{1.73_m2} — ABNORMAL LOW (ref >59)
GFR calc non Af Amer: 41 mL/min/{1.73_m2} — ABNORMAL LOW (ref >59)
Globulin, Total: 2.6 g/dL (ref 1.5–4.5)
Glucose: 148 mg/dL — ABNORMAL HIGH (ref 65–99)
Potassium: 4 mmol/L (ref 3.5–5.2)
Sodium: 143 mmol/L (ref 134–144)
Total Protein: 6.6 g/dL (ref 6.0–8.5)

## 2020-02-29 LAB — CBC WITH DIFFERENTIAL/PLATELET
Basophils Absolute: 0 10*3/uL (ref 0.0–0.2)
Basos: 0 %
EOS (ABSOLUTE): 0.2 10*3/uL (ref 0.0–0.4)
Eos: 2 %
Hematocrit: 34.9 % (ref 34.0–46.6)
Hemoglobin: 11.7 g/dL (ref 11.1–15.9)
Immature Grans (Abs): 0 10*3/uL (ref 0.0–0.1)
Immature Granulocytes: 0 %
Lymphocytes Absolute: 2.8 10*3/uL (ref 0.7–3.1)
Lymphs: 31 %
MCH: 30.5 pg (ref 26.6–33.0)
MCHC: 33.5 g/dL (ref 31.5–35.7)
MCV: 91 fL (ref 79–97)
Monocytes Absolute: 0.7 10*3/uL (ref 0.1–0.9)
Monocytes: 8 %
Neutrophils Absolute: 5.2 10*3/uL (ref 1.4–7.0)
Neutrophils: 59 %
Platelets: 185 10*3/uL (ref 150–450)
RBC: 3.84 x10E6/uL (ref 3.77–5.28)
RDW: 13.6 % (ref 11.7–15.4)
WBC: 8.9 10*3/uL (ref 3.4–10.8)

## 2020-02-29 LAB — SEDIMENTATION RATE: Sed Rate: 3 mm/hr (ref 0–32)

## 2020-03-07 ENCOUNTER — Telehealth: Payer: Self-pay

## 2020-03-07 ENCOUNTER — Other Ambulatory Visit: Payer: Self-pay

## 2020-03-07 ENCOUNTER — Ambulatory Visit (HOSPITAL_COMMUNITY)
Admission: RE | Admit: 2020-03-07 | Discharge: 2020-03-07 | Disposition: A | Discharge status: Home / Self Care | Payer: Medicare Other | Source: Ambulatory Visit | Attending: Family Medicine | Admitting: Family Medicine

## 2020-03-07 DIAGNOSIS — D259 Leiomyoma of uterus, unspecified: Secondary | ICD-10-CM | POA: Diagnosis not present

## 2020-03-07 DIAGNOSIS — M7918 Myalgia, other site: Secondary | ICD-10-CM | POA: Diagnosis not present

## 2020-03-07 DIAGNOSIS — M5126 Other intervertebral disc displacement, lumbar region: Secondary | ICD-10-CM

## 2020-03-07 DIAGNOSIS — M5127 Other intervertebral disc displacement, lumbosacral region: Secondary | ICD-10-CM | POA: Diagnosis not present

## 2020-03-07 DIAGNOSIS — N2889 Other specified disorders of kidney and ureter: Secondary | ICD-10-CM

## 2020-03-07 DIAGNOSIS — M545 Low back pain, unspecified: Secondary | ICD-10-CM | POA: Diagnosis not present

## 2020-03-07 DIAGNOSIS — N83291 Other ovarian cyst, right side: Secondary | ICD-10-CM | POA: Diagnosis not present

## 2020-03-07 NOTE — Telephone Encounter (Signed)
[facility] calls nurse line to make PCP aware of recent imaging.

## 2020-03-08 DIAGNOSIS — M5126 Other intervertebral disc displacement, lumbar region: Secondary | ICD-10-CM | POA: Insufficient documentation

## 2020-03-08 DIAGNOSIS — N2889 Other specified disorders of kidney and ureter: Secondary | ICD-10-CM | POA: Insufficient documentation

## 2020-03-08 MED ORDER — GABAPENTIN 100 MG PO CAPS: 100.0000 mg | ORAL_CAPSULE | Freq: Three times a day (TID) | ORAL | 1 refills | Status: DC | PRN

## 2020-03-08 NOTE — Telephone Encounter (Signed)
Called caregiver [name] to discuss imaging results.  Pelvic MRI overall not very remarkable. Lumbar spine MRI has several significant findings  1. Large disc herniation, probably explaining her symptoms. Will refer to neurosurgery for further evaluation given degree of pain/disability from this. Start low dose gabapentin, counseled on potential side effects.  2. Renal mass - needs f/u CT scan with and without contrast to further assess. Have ordered and will ask red team to schedule & contact [name] with appointment info.  3. Indeterminate lesion in vertebral body - will be further assessed on CT scan, may require further workup after that.  [name] requested I call patient's court-appointed guardian, [name] ([phone]) to update her on these results. I will call her tomorrow morning [name] does not think she will be accessible at this # after 5pm).  Red team, please schedule CT scan & contact [name] with appointment.  Leeanne Rio, MD

## 2020-03-09 NOTE — Telephone Encounter (Signed)
Called and spoke with guardian [name], updated on information as below. [name] appreciative and agrees with this plan of workup.  Leeanne Rio, MD

## 2020-03-12 ENCOUNTER — Telehealth: Payer: Self-pay

## 2020-03-12 NOTE — Telephone Encounter (Signed)
As [place] had some trouble getting this scheduled, I called and was able to get it scheduled for next Tues at 10:30am at [facility].   Needs to be NPO for 4 hours prior. Needs to pick up prep beforehand at radiology dept either at [facility] or [facility].  Red team, can you inform caregiver [name] of this appointment?  Thanks Leeanne Rio, MD

## 2020-03-12 NOTE — Telephone Encounter (Signed)
Letter written, will place at front desk. Please inform caregiver [name].  Thanks Leeanne Rio, MD

## 2020-03-12 NOTE — Telephone Encounter (Signed)
Patient's caregiver, [name], calls nurse line requesting a letter stating that patient can receive booster COVID vaccination.   Patient received original doses of Moderna on 07/11/19 and 08/08/19.   To PCP  Talbot Grumbling, RN

## 2020-03-13 NOTE — Telephone Encounter (Signed)
LVM for [name] (cargiver) to pick up letter.  Ozella Almond, [place]

## 2020-03-13 NOTE — Telephone Encounter (Signed)
LMOVM with appt info. If pt caregiver calls back please inform her again. Larena Ohnemus Kennon Holter, CMA

## 2020-03-15 ENCOUNTER — Other Ambulatory Visit: Payer: Self-pay | Admitting: Family Medicine

## 2020-03-16 DIAGNOSIS — M5417 Radiculopathy, lumbosacral region: Secondary | ICD-10-CM | POA: Diagnosis not present

## 2020-03-16 DIAGNOSIS — M5127 Other intervertebral disc displacement, lumbosacral region: Secondary | ICD-10-CM | POA: Diagnosis not present

## 2020-03-18 DIAGNOSIS — S76312A Strain of muscle, fascia and tendon of the posterior muscle group at thigh level, left thigh, initial encounter: Secondary | ICD-10-CM | POA: Diagnosis not present

## 2020-03-20 ENCOUNTER — Ambulatory Visit (HOSPITAL_COMMUNITY)
Admission: RE | Admit: 2020-03-20 | Discharge: 2020-03-20 | Disposition: A | Discharge status: Home / Self Care | Payer: Medicare Other | Source: Ambulatory Visit | Attending: Family Medicine | Admitting: Family Medicine

## 2020-03-20 ENCOUNTER — Other Ambulatory Visit: Payer: Self-pay

## 2020-03-20 DIAGNOSIS — K6389 Other specified diseases of intestine: Secondary | ICD-10-CM | POA: Diagnosis not present

## 2020-03-20 DIAGNOSIS — N2889 Other specified disorders of kidney and ureter: Secondary | ICD-10-CM | POA: Insufficient documentation

## 2020-03-20 DIAGNOSIS — D1809 Hemangioma of other sites: Secondary | ICD-10-CM | POA: Diagnosis not present

## 2020-03-20 DIAGNOSIS — D259 Leiomyoma of uterus, unspecified: Secondary | ICD-10-CM | POA: Diagnosis not present

## 2020-03-20 DIAGNOSIS — N281 Cyst of kidney, acquired: Secondary | ICD-10-CM | POA: Diagnosis not present

## 2020-03-20 MED ORDER — IOHEXOL 300 MG/ML  SOLN
100.0000 mL | Freq: Once | INTRAMUSCULAR | Status: AC | PRN
  Administered 2020-03-20: 80 mL via INTRAVENOUS

## 2020-03-23 ENCOUNTER — Telehealth: Payer: Self-pay | Admitting: Family Medicine

## 2020-03-23 ENCOUNTER — Other Ambulatory Visit: Payer: Self-pay | Admitting: Family Medicine

## 2020-03-23 DIAGNOSIS — N2889 Other specified disorders of kidney and ureter: Secondary | ICD-10-CM

## 2020-03-23 NOTE — Telephone Encounter (Signed)
Called caregiver [name] to discuss CT results. 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 Next step is urology referral, anticipate she will likely need surgery. Also called guardian [name] and informed of the same.  After discussion, the plan is for me to discuss results with patient at her upcoming appointment with me on Tuesday. Given her intellectual disability, it is unclear how much patient will understand about this diagnosis so it will be good to talk about it in person and answer her questions.  Regarding her back pain, [name] reports that she was seen by surgeon Dr. Arnoldo Morale and he recommended back surgery for her. They are waiting on her guardian to sign to approve the surgery before scheduling. I suggested that [facility] let Dr. Arnoldo Morale know about this kidney cancer diagnosis in case it impacts his management.  I called [facility] and they have no appts until January, even for a patient with cancer. They suggested calling [facility].  I called [facility] and was able to get her an appointment for 12/9 at 3pm at the Urology [place] office, which is the soonest available appointment. They will have the urologist review her records and then will likely contact her for a sooner work-in appointment after physician has reviewed.  Called Faye back and filled her in on this plan. She is appreciative.  I have entered referral so info can be sent to the [facility] office. They do request a CD with images from her scans. Jazmin Hartsell has helped coordinate the CD being made at radiology. I will pick it up today and have it here at the [facility] and can give it to patient's caregiver at her appointment on Tuesday, so they can take it with them to the urology appointment.  Leeanne Rio, MD

## 2020-03-23 NOTE — Telephone Encounter (Signed)
Referral and notes faxed to office provided.  Dorann Davidson,CMA

## 2020-03-27 ENCOUNTER — Ambulatory Visit (INDEPENDENT_AMBULATORY_CARE_PROVIDER_SITE_OTHER): Payer: Medicare Other | Admitting: Family Medicine

## 2020-03-27 ENCOUNTER — Other Ambulatory Visit: Payer: Self-pay

## 2020-03-27 VITALS — BP 110/70 | HR 106 | Ht 65.0 in | Wt 139.0 lb

## 2020-03-27 DIAGNOSIS — N2889 Other specified disorders of kidney and ureter: Secondary | ICD-10-CM

## 2020-03-27 DIAGNOSIS — M5126 Other intervertebral disc displacement, lumbar region: Secondary | ICD-10-CM | POA: Diagnosis not present

## 2020-03-27 DIAGNOSIS — E1140 Type 2 diabetes mellitus with diabetic neuropathy, unspecified: Secondary | ICD-10-CM

## 2020-03-27 DIAGNOSIS — E1122 Type 2 diabetes mellitus with diabetic chronic kidney disease: Secondary | ICD-10-CM | POA: Diagnosis not present

## 2020-03-27 DIAGNOSIS — R809 Proteinuria, unspecified: Secondary | ICD-10-CM | POA: Diagnosis not present

## 2020-03-27 LAB — POCT GLYCOSYLATED HEMOGLOBIN (HGB A1C): Hemoglobin A1C: 6.7 % — AB (ref 4.0–5.6)

## 2020-03-27 MED ORDER — ONETOUCH DELICA LANCING DEV MISC: 0 refills | Status: DC

## 2020-03-27 MED ORDER — ONETOUCH DELICA LANCETS 33G MISC: 3 refills | Status: DC

## 2020-03-27 MED ORDER — GABAPENTIN 100 MG PO CAPS
200.0000 mg | ORAL_CAPSULE | Freq: Every day | ORAL | 0 refills | Status: DC
Start: 2020-03-27 — End: 2020-05-15

## 2020-03-27 MED ORDER — ONETOUCH VERIO W/DEVICE KIT: PACK | 0 refills | Status: DC

## 2020-03-27 MED ORDER — ONETOUCH VERIO VI STRP: ORAL_STRIP | 3 refills | Status: DC

## 2020-03-27 NOTE — Patient Instructions (Addendum)
It was great to see you again today!  Take CDs to the appointment at [facility] on Thursday.  Sent in new glucometer and supplies  Increase to 2 pills of gabapentin at bedtime   Follow up with me in 3 months, sooner if needed  Be well, [name]

## 2020-03-27 NOTE — Assessment & Plan Note (Signed)
Recent microalbumin:creat ratio elevated, but holding off on starting ACE given likely new renal cancer; wait til this workup/treatment is complete.

## 2020-03-27 NOTE — Assessment & Plan Note (Signed)
Some improvement with gabapentin. Increase to 232m at bedtime. Continue follow up with neurosurgery.

## 2020-03-27 NOTE — Assessment & Plan Note (Signed)
A1c at goal. Continue present medications. Sent in new meter and supplies.

## 2020-03-27 NOTE — Assessment & Plan Note (Signed)
Discussed cancer dx with patient today. She is intellectually disabled and I don't think she really understands what cancer is/means, but I explained it on a very superficial level and answered her questions. Prepared her for possibility of surgery being recommended when she meets with urologist in 2 days. Gave caregiver CDs with her CT and MRIs to take with them to the appointment on Thursday at [facility].

## 2020-03-27 NOTE — Progress Notes (Signed)
  Date of Visit: 03/27/2020   SUBJECTIVE:   HPI:  [name] presents today for follow up of CT scan and diabetes.  CT scan demonstrated likely papillary kidney cancer. Discussed result with patient and caregiver today. Has appointment with urology in 2 days at [facility].  Diabetes - A1c today 6.6. they continue to work on healthy eating. Taking glipizide 13m twice daily and januvia 50 mg daily.  Back pain - taking gabapentin 1019mat bedtime. Gets too sleepy if takes it during the day. Neurosurgeon recommended she undergo surgery.  OBJECTIVE:   BP 110/70   Pulse (!) 106   Ht 5' 5"  (1.651 m)   Wt 139 lb (63 kg)   SpO2 97%   BMI 23.13 kg/m  Gen: no acute distress, pleasant, cooperative HEENT: normocephalic, atraumatic  Lungs: normal work of breathing  Neuro: alert, speech normal, grossly nonfocal  ASSESSMENT/PLAN:   Health maintenance:  -received COVID booster on 11/1 -otherwise UTD on HM items  Microalbuminuria Recent microalbumin:creat ratio elevated, but holding off on starting ACE given likely new renal cancer; wait til this workup/treatment is complete.  Renal mass Discussed cancer dx with patient today. She is intellectually disabled and I don't think she really understands what cancer is/means, but I explained it on a very superficial level and answered her questions. Prepared her for possibility of surgery being recommended when she meets with urologist in 2 days. Gave caregiver CDs with her CT and MRIs to take with them to [facility] Lumbar disc herniation Some improvement with gabapentin. Increase to 20087mt bedtime. Continue follow up with neurosurgery.  Diabetic neuropathy, type II diabetes mellitus (HCCCharles1c at goal. Continue present medications. Sent in new meter and supplies.  FOLLOW UP: Follow up in 3 mos for above issues  [place]ri[place]ania McIArdelia MemsD [place]

## 2020-03-29 DIAGNOSIS — C649 Malignant neoplasm of unspecified kidney, except renal pelvis: Secondary | ICD-10-CM | POA: Diagnosis not present

## 2020-04-02 ENCOUNTER — Telehealth: Payer: Self-pay

## 2020-04-02 NOTE — Telephone Encounter (Signed)
[name] calls nurse line stating she needs a letter from PCP discontinuing PRN Gabapentin and changing to (2) at bedtime. [name] states the state requires this for all medication changes. [name] will pick up letter once written.

## 2020-04-04 NOTE — Telephone Encounter (Signed)
Letter written, will place at front desk, please inform Nanda Quinton, MD

## 2020-04-09 DIAGNOSIS — Z79899 Other long term (current) drug therapy: Secondary | ICD-10-CM | POA: Diagnosis not present

## 2020-04-09 NOTE — Telephone Encounter (Signed)
Called and informed [name] of letter.  Ozella Almond, [place]

## 2020-04-18 ENCOUNTER — Telehealth: Payer: Self-pay

## 2020-04-18 DIAGNOSIS — M5127 Other intervertebral disc displacement, lumbosacral region: Secondary | ICD-10-CM | POA: Diagnosis not present

## 2020-04-18 HISTORY — PX: SPINE SURGERY: SHX786

## 2020-04-18 NOTE — Telephone Encounter (Signed)
[name] calls nurse line reporting patient had back surgery today and was given oxycodone every 4 hours. [name] is wondering if she can continue Gabapentin at night while on pain medication. I spoke with PCP who stated it is ok for her to take both as long as she does not seem too sedated. [name] appreciative.

## 2020-04-24 DIAGNOSIS — F250 Schizoaffective disorder, bipolar type: Secondary | ICD-10-CM | POA: Diagnosis not present

## 2020-04-27 ENCOUNTER — Other Ambulatory Visit: Payer: Self-pay | Admitting: Family Medicine

## 2020-05-03 ENCOUNTER — Other Ambulatory Visit: Payer: Self-pay | Admitting: Family Medicine

## 2020-05-08 DIAGNOSIS — F250 Schizoaffective disorder, bipolar type: Secondary | ICD-10-CM | POA: Diagnosis not present

## 2020-05-15 ENCOUNTER — Encounter: Payer: Self-pay | Admitting: Family Medicine

## 2020-05-15 ENCOUNTER — Other Ambulatory Visit: Payer: Self-pay

## 2020-05-15 ENCOUNTER — Ambulatory Visit (INDEPENDENT_AMBULATORY_CARE_PROVIDER_SITE_OTHER): Payer: Medicare Other | Admitting: Family Medicine

## 2020-05-15 VITALS — BP 112/70 | HR 108 | Ht 65.0 in | Wt 137.0 lb

## 2020-05-15 DIAGNOSIS — F209 Schizophrenia, unspecified: Secondary | ICD-10-CM

## 2020-05-15 DIAGNOSIS — Z3042 Encounter for surveillance of injectable contraceptive: Secondary | ICD-10-CM

## 2020-05-15 DIAGNOSIS — E1122 Type 2 diabetes mellitus with diabetic chronic kidney disease: Secondary | ICD-10-CM

## 2020-05-15 DIAGNOSIS — M5126 Other intervertebral disc displacement, lumbar region: Secondary | ICD-10-CM

## 2020-05-15 DIAGNOSIS — R809 Proteinuria, unspecified: Secondary | ICD-10-CM | POA: Diagnosis not present

## 2020-05-15 DIAGNOSIS — Z1211 Encounter for screening for malignant neoplasm of colon: Secondary | ICD-10-CM

## 2020-05-15 DIAGNOSIS — N2889 Other specified disorders of kidney and ureter: Secondary | ICD-10-CM

## 2020-05-15 DIAGNOSIS — I471 Supraventricular tachycardia: Secondary | ICD-10-CM

## 2020-05-15 MED ORDER — MEDROXYPROGESTERONE ACETATE 150 MG/ML IM SUSY
150.0000 mg | PREFILLED_SYRINGE | Freq: Once | INTRAMUSCULAR | Status: AC
  Administered 2020-05-15: 150 mg via INTRAMUSCULAR

## 2020-05-15 NOTE — Patient Instructions (Signed)
It was great to see you again today!  I will call Clyde Canterbury about the kidney surgery.  Schedule eye exam - she's due for this in February  Stop gabapentin  Depo shot given today  Will receive cologuard in the mail to send back  Come back tomorrow to check cholesterol  Follow up with me in February as scheduled  Be well, [name]

## 2020-05-15 NOTE — Progress Notes (Signed)
Patient given Depo-Provera injection in [place]. Patient tolerated it well.  Reminder card given for March 22- April 5.  .Ozella Almond, CMA

## 2020-05-15 NOTE — Progress Notes (Signed)
  Date of Visit: 05/15/2020   SUBJECTIVE:   HPI:  [name] presents today for routine follow up.  Back surgery - had surgery on 12/8. Is doing much MUCH better since that surgery. Has continued taking gabapentin 23m at night but caregiver wonders whether we can discontinue this. Overall doing well.  [facility]ikely kidney [facility]. Caregiver Jacqueline Mediansays she is still waiting to hear from guardian Jacqueline Canterburyabout whether they can proceed with surgery. Jacqueline Canterburyapparently wanted [name] to recover from the back surgery first.  Due for depo today  OBJECTIVE:   BP 112/70   Pulse (!) 108   Ht 5' 5"  (1.651 m)   Wt 137 lb (62.1 kg)   SpO2 96%   BMI 22.80 kg/m  Gen: no acute distress, pleasant cooperative HEENT: normocephalic, atraumatic  Heart: mildly tachycardic, regular rhythm, no murmurs Lungs: clear to auscultation bilaterally, normal work of breathing  Neuro: alert, speech normal, grossly nonfocal Ext: No appreciable lower extremity edema bilaterally  Diabetic foot exam: 2+ DP pulses bilat, normal monofilament testing bilaterally. Callous present on medial side of R heel without skin breakdown, no other lesions noted  ASSESSMENT/PLAN:   Health maintenance:  -foot exam done today, advised on routine foot care and callous management -advised to ensure she is scheduled for eye exam (due in February) -reviewed options for colon cancer screening, patient/caregiver elected for cologuard, have ordered this  Renal mass Surgery is pending approval from patient's guardian, [name] I called [name] day after this visit (called on 05/16/20) and spoke with her. She confirms she does want [name] to proceed with the surgery. I called caregiver Jacqueline Medianand relayed this information. Jacqueline Medianwill work on getting surgery scheduled and will be in touch with Jacqueline Canterburyfor any consent paperwork etc.  Lumbar disc herniation Doing really well after  surgery. I think we can stop her gabapentin and see how she does.  Microalbuminuria Given pending renal surgery, will hold on starting ACE/ARB at this time.  Schizophrenia ([place] Continues to follow up with psychiatry regularly.  SVT (supraventricular tachycardia) (HCC) Stable overall on metoprolol.  Contraception management Continues on depo  Hyperlipidemia - update lipids tomorrow at fasting lab visit  FOLLOW UP: Follow up in February for above issues   BTanzaniaJ. MArdelia Mems [place]

## 2020-05-16 ENCOUNTER — Other Ambulatory Visit: Payer: Self-pay

## 2020-05-16 ENCOUNTER — Other Ambulatory Visit: Payer: Medicare Other

## 2020-05-16 DIAGNOSIS — E1122 Type 2 diabetes mellitus with diabetic chronic kidney disease: Secondary | ICD-10-CM

## 2020-05-16 DIAGNOSIS — Z1211 Encounter for screening for malignant neoplasm of colon: Secondary | ICD-10-CM

## 2020-05-16 NOTE — Assessment & Plan Note (Signed)
Doing really well after surgery. I think we can stop her gabapentin and see how she does.

## 2020-05-16 NOTE — Assessment & Plan Note (Signed)
Stable overall on metoprolol.

## 2020-05-16 NOTE — Assessment & Plan Note (Signed)
Continues to follow up with psychiatry regularly.

## 2020-05-16 NOTE — Assessment & Plan Note (Signed)
Surgery is pending approval from patient's guardian, [name]. I called [name] day after this visit (called on 05/16/20) and spoke with her. She confirms she does want [name] to proceed with the surgery. I called caregiver [name] and relayed this information. [name] will work on getting surgery scheduled and will be in touch with [name] for any consent paperwork etc.

## 2020-05-16 NOTE — Assessment & Plan Note (Signed)
Given pending renal surgery, will hold on starting ACE/ARB at this time.

## 2020-05-16 NOTE — Assessment & Plan Note (Signed)
Continues on depo

## 2020-05-17 ENCOUNTER — Encounter: Payer: Self-pay | Admitting: Family Medicine

## 2020-05-17 HISTORY — PX: KIDNEY SURGERY: SHX687

## 2020-05-17 LAB — LIPID PANEL
Chol/HDL Ratio: 2.6 ratio (ref 0.0–4.4)
Cholesterol, Total: 90 mg/dL — ABNORMAL LOW (ref 100–199)
HDL: 34 mg/dL — ABNORMAL LOW (ref >39)
LDL Chol Calc (NIH): 40 mg/dL (ref 0–99)
Triglycerides: 78 mg/dL (ref 0–149)
VLDL Cholesterol Cal: 16 mg/dL (ref 5–40)

## 2020-05-25 DIAGNOSIS — Z8679 Personal history of other diseases of the circulatory system: Secondary | ICD-10-CM | POA: Diagnosis not present

## 2020-05-25 DIAGNOSIS — I10 Essential (primary) hypertension: Secondary | ICD-10-CM | POA: Diagnosis not present

## 2020-05-25 DIAGNOSIS — E119 Type 2 diabetes mellitus without complications: Secondary | ICD-10-CM | POA: Diagnosis not present

## 2020-05-25 DIAGNOSIS — Z7982 Long term (current) use of aspirin: Secondary | ICD-10-CM | POA: Diagnosis not present

## 2020-05-25 DIAGNOSIS — E785 Hyperlipidemia, unspecified: Secondary | ICD-10-CM | POA: Diagnosis not present

## 2020-05-25 DIAGNOSIS — R4189 Other symptoms and signs involving cognitive functions and awareness: Secondary | ICD-10-CM | POA: Diagnosis not present

## 2020-05-25 DIAGNOSIS — N281 Cyst of kidney, acquired: Secondary | ICD-10-CM | POA: Diagnosis not present

## 2020-05-25 DIAGNOSIS — Z01818 Encounter for other preprocedural examination: Secondary | ICD-10-CM | POA: Diagnosis not present

## 2020-05-25 DIAGNOSIS — F209 Schizophrenia, unspecified: Secondary | ICD-10-CM | POA: Diagnosis not present

## 2020-05-25 DIAGNOSIS — N2889 Other specified disorders of kidney and ureter: Secondary | ICD-10-CM | POA: Diagnosis not present

## 2020-05-25 DIAGNOSIS — Z79899 Other long term (current) drug therapy: Secondary | ICD-10-CM | POA: Diagnosis not present

## 2020-05-25 DIAGNOSIS — K219 Gastro-esophageal reflux disease without esophagitis: Secondary | ICD-10-CM | POA: Diagnosis not present

## 2020-05-25 DIAGNOSIS — Z7984 Long term (current) use of oral hypoglycemic drugs: Secondary | ICD-10-CM | POA: Diagnosis not present

## 2020-05-25 DIAGNOSIS — D49512 Neoplasm of unspecified behavior of left kidney: Secondary | ICD-10-CM | POA: Diagnosis not present

## 2020-05-26 DIAGNOSIS — R000 Tachycardia, unspecified: Secondary | ICD-10-CM | POA: Diagnosis not present

## 2020-06-04 DIAGNOSIS — E119 Type 2 diabetes mellitus without complications: Secondary | ICD-10-CM | POA: Diagnosis not present

## 2020-06-04 DIAGNOSIS — D49512 Neoplasm of unspecified behavior of left kidney: Secondary | ICD-10-CM | POA: Diagnosis not present

## 2020-06-04 DIAGNOSIS — E785 Hyperlipidemia, unspecified: Secondary | ICD-10-CM | POA: Diagnosis not present

## 2020-06-04 DIAGNOSIS — N2889 Other specified disorders of kidney and ureter: Secondary | ICD-10-CM | POA: Diagnosis not present

## 2020-06-04 DIAGNOSIS — I10 Essential (primary) hypertension: Secondary | ICD-10-CM | POA: Diagnosis not present

## 2020-06-04 DIAGNOSIS — C642 Malignant neoplasm of left kidney, except renal pelvis: Secondary | ICD-10-CM | POA: Diagnosis not present

## 2020-06-04 DIAGNOSIS — F209 Schizophrenia, unspecified: Secondary | ICD-10-CM | POA: Diagnosis not present

## 2020-06-04 DIAGNOSIS — N281 Cyst of kidney, acquired: Secondary | ICD-10-CM | POA: Diagnosis not present

## 2020-06-05 DIAGNOSIS — F209 Schizophrenia, unspecified: Secondary | ICD-10-CM | POA: Diagnosis not present

## 2020-06-05 DIAGNOSIS — D49512 Neoplasm of unspecified behavior of left kidney: Secondary | ICD-10-CM | POA: Diagnosis not present

## 2020-06-05 DIAGNOSIS — I10 Essential (primary) hypertension: Secondary | ICD-10-CM | POA: Diagnosis not present

## 2020-06-05 DIAGNOSIS — N281 Cyst of kidney, acquired: Secondary | ICD-10-CM | POA: Diagnosis not present

## 2020-06-05 DIAGNOSIS — E119 Type 2 diabetes mellitus without complications: Secondary | ICD-10-CM | POA: Diagnosis not present

## 2020-06-05 DIAGNOSIS — E785 Hyperlipidemia, unspecified: Secondary | ICD-10-CM | POA: Diagnosis not present

## 2020-06-06 ENCOUNTER — Other Ambulatory Visit: Payer: Self-pay | Admitting: Family Medicine

## 2020-06-08 ENCOUNTER — Encounter: Payer: Self-pay | Admitting: Family Medicine

## 2020-06-08 DIAGNOSIS — C649 Malignant neoplasm of unspecified kidney, except renal pelvis: Secondary | ICD-10-CM | POA: Insufficient documentation

## 2020-06-15 DIAGNOSIS — E782 Mixed hyperlipidemia: Secondary | ICD-10-CM | POA: Diagnosis not present

## 2020-06-15 DIAGNOSIS — R7309 Other abnormal glucose: Secondary | ICD-10-CM | POA: Diagnosis not present

## 2020-06-15 DIAGNOSIS — F250 Schizoaffective disorder, bipolar type: Secondary | ICD-10-CM | POA: Diagnosis not present

## 2020-07-02 ENCOUNTER — Other Ambulatory Visit: Payer: Self-pay | Admitting: Family Medicine

## 2020-07-02 NOTE — Progress Notes (Unsigned)
  Date of Visit: 07/03/2020   SUBJECTIVE:   HPI:  [name] presents today for routine follow up.   Diabetes - currently taking januvia 32m daily and glipizide 138mtwice daily. Tolerating well. Of note last microalbumin/creat ratio was elevated at 69. Not currently on ACE. Fasting sugars running 140s-160s. No low sugars. A1c today 6.3.  Schizophrenia - continues to follow up with psychiatry and is maintained on regimen of paxil, depakote, clozapine, cogentin, and ambien. Recently was started on haldol by psychiatry but caregiver [name] not certain of the dose. Psych provider [name].  GERD - taking pepcid 208mwice daily. Doing well on this regimen.  Constipation - taking miralax daily, with up to 3 additional doses. Also takes colace twice daily.   Had renal cancer removed surgically on 1/24. Since then has done well postoperatively. Seen by urology for postop follow up and was advised to follow up again in 1 year.  OBJECTIVE:   BP 112/75   Pulse (!) 113   Ht 5' 5"  (1.651 m)   Wt 136 lb 6.4 oz (61.9 kg)   SpO2 99%   BMI 22.70 kg/m  Gen: no acute distress, pleasant, cooperative HEENT: normocephalic, atraumatic  Heart: regular rate and rhythm, no murmur Lungs: clear to auscultation bilaterally, normal work of breathing  Neuro: alert, speech normal, grossly nonfocal Ext: No appreciable lower extremity edema bilaterally   ASSESSMENT/PLAN:   Health maintenance:  -has eye appointment 2/23 -will check status of cologuard (caregiver says they have not received it at home yet)  Diabetic neuropathy, type II diabetes mellitus (HCCShell Pointell controlled. Continue current medication regimen. Follow up in 4 months for next A1c.  CONSTIPATION, CHRONIC Well controlled. Continue current medication regimen.   CHRONIC KIDNEY DISEASE STAGE III (MODERATE) Adding lisinopril for renal protection - will wait to start until 1 week prior to lab visit on 3/22  (coincides with depo due date).  Schizophrenia (HCCOhioollows with psychiatry. Caregiver to let us [place] dosing info for her new medication (we think it is haldol).  Papillary renal cell carcinoma (HCC) Doing well postop with negative margins - will follow up with urology at [facility] 1 year  GERD (gastroesophageal reflux disease) Well controlled. Continue current medication regimen.   FOLLOW UP: Follow up in 4 mos for diabetes   [name] [place]

## 2020-07-03 ENCOUNTER — Ambulatory Visit (INDEPENDENT_AMBULATORY_CARE_PROVIDER_SITE_OTHER): Payer: Medicare Other | Admitting: Family Medicine

## 2020-07-03 ENCOUNTER — Other Ambulatory Visit: Payer: Self-pay

## 2020-07-03 VITALS — BP 112/75 | HR 113 | Ht 65.0 in | Wt 136.4 lb

## 2020-07-03 DIAGNOSIS — E1140 Type 2 diabetes mellitus with diabetic neuropathy, unspecified: Secondary | ICD-10-CM | POA: Diagnosis not present

## 2020-07-03 DIAGNOSIS — N183 Chronic kidney disease, stage 3 unspecified: Secondary | ICD-10-CM

## 2020-07-03 DIAGNOSIS — E1122 Type 2 diabetes mellitus with diabetic chronic kidney disease: Secondary | ICD-10-CM | POA: Diagnosis not present

## 2020-07-03 DIAGNOSIS — K219 Gastro-esophageal reflux disease without esophagitis: Secondary | ICD-10-CM | POA: Diagnosis not present

## 2020-07-03 DIAGNOSIS — K5909 Other constipation: Secondary | ICD-10-CM | POA: Diagnosis not present

## 2020-07-03 DIAGNOSIS — F209 Schizophrenia, unspecified: Secondary | ICD-10-CM | POA: Diagnosis not present

## 2020-07-03 DIAGNOSIS — C649 Malignant neoplasm of unspecified kidney, except renal pelvis: Secondary | ICD-10-CM | POA: Diagnosis not present

## 2020-07-03 LAB — POCT GLYCOSYLATED HEMOGLOBIN (HGB A1C): Hemoglobin A1C: 6.3 % — AB (ref 4.0–5.6)

## 2020-07-03 MED ORDER — LISINOPRIL 2.5 MG PO TABS: 2.5000 mg | ORAL_TABLET | Freq: Every day | ORAL | 0 refills | Status: DC

## 2020-07-03 NOTE — Assessment & Plan Note (Signed)
Follows with psychiatry. Caregiver to let us know dosing info for her new medication (we think it is haldol).

## 2020-07-03 NOTE — Assessment & Plan Note (Signed)
Doing well postop with negative margins - will follow up with urology at [facility] in 1 year

## 2020-07-03 NOTE — Assessment & Plan Note (Signed)
Well controlled. Continue current medication regimen.

## 2020-07-03 NOTE — Patient Instructions (Signed)
Come back on 3/22 for lab and depo appointment.  Will check on status of cologuard.  Call with dose of the haldol she's taking.  Sent in lisinopril 2.30m daily. Start taking this a week before the lab appointment.  Follow up with me in 4 months, sooner if needed  Be well, Dr. MArdelia Mems

## 2020-07-03 NOTE — Assessment & Plan Note (Signed)
Adding lisinopril for renal protection - will wait to start until 1 week prior to lab visit on 3/22 (coincides with depo due date).

## 2020-07-03 NOTE — Assessment & Plan Note (Signed)
Well controlled. Continue current medication regimen. Follow up in 4 months for next A1c.

## 2020-07-10 ENCOUNTER — Other Ambulatory Visit: Payer: Self-pay | Admitting: Lactation Services

## 2020-07-10 ENCOUNTER — Other Ambulatory Visit: Payer: Self-pay | Admitting: Family Medicine

## 2020-07-10 DIAGNOSIS — Z1231 Encounter for screening mammogram for malignant neoplasm of breast: Secondary | ICD-10-CM

## 2020-07-17 DIAGNOSIS — F250 Schizoaffective disorder, bipolar type: Secondary | ICD-10-CM | POA: Diagnosis not present

## 2020-07-18 ENCOUNTER — Telehealth: Payer: Self-pay | Admitting: Family Medicine

## 2020-07-18 NOTE — Telephone Encounter (Signed)
Form completed and returned to RN team Leeanne Rio, MD

## 2020-07-18 NOTE — Telephone Encounter (Signed)
[employer], [facility])  form dropped off for at front desk for completion.  Verified that patient section of form has been completed.  Last DOS/WCC with PCP was 07/03/20.  Placed form in team folder to be completed by clinical staff.  Creig Hines

## 2020-07-18 NOTE — Telephone Encounter (Signed)
Reviewed form and placed in PCP's box for completion.  Ozella Almond, [place]

## 2020-07-20 ENCOUNTER — Telehealth: Payer: Self-pay | Admitting: Family Medicine

## 2020-07-20 NOTE — Telephone Encounter (Signed)
This would only work if patient was able to have a bowel movement while she is here in the office. Does [name] think this is possible?  Leeanne Rio, MD

## 2020-07-20 NOTE — Telephone Encounter (Signed)
Therapeutic provider- [name] is calling to let doctor know, she was scheduled for colagaurd at home but she is having trouble with it because patient isn't letting her know when she has to go to the bathroom, she wanting to know if doctor can scheduled for patient to do it in office. Please advise. Thanks! [name] [phone]

## 2020-07-23 NOTE — Telephone Encounter (Signed)
Called and LVM for [name] informing her of message per [name].  Ozella Almond, [place]

## 2020-07-23 NOTE — Telephone Encounter (Signed)
Patient's caregiver called and informed that forms are ready for pick up. Copy made and placed in batch scanning. Original placed at front desk for pick up.   Talbot Grumbling, RN

## 2020-07-23 NOTE — Telephone Encounter (Signed)
Spoke to [place] and she states that she wants a referral for patient to go to GI for a coloscopy.  Original message was misconstrued.  She was given a few numbers for GI and can choose which one is more convenient for patient.  Patient has never had one before so [name] will choose according to her schedule.  Ozella Almond, [place]

## 2020-07-24 ENCOUNTER — Other Ambulatory Visit: Payer: Self-pay | Admitting: Family Medicine

## 2020-07-27 ENCOUNTER — Other Ambulatory Visit: Payer: Self-pay | Admitting: Family Medicine

## 2020-07-31 ENCOUNTER — Other Ambulatory Visit: Payer: Self-pay

## 2020-07-31 ENCOUNTER — Other Ambulatory Visit: Payer: Medicare Other

## 2020-07-31 ENCOUNTER — Ambulatory Visit (INDEPENDENT_AMBULATORY_CARE_PROVIDER_SITE_OTHER): Payer: Medicare Other

## 2020-07-31 DIAGNOSIS — Z3042 Encounter for surveillance of injectable contraceptive: Secondary | ICD-10-CM | POA: Diagnosis not present

## 2020-07-31 DIAGNOSIS — E1122 Type 2 diabetes mellitus with diabetic chronic kidney disease: Secondary | ICD-10-CM

## 2020-07-31 MED ORDER — MEDROXYPROGESTERONE ACETATE 150 MG/ML IM SUSP
150.0000 mg | Freq: Once | INTRAMUSCULAR | Status: AC
  Administered 2020-07-31: 150 mg via INTRAMUSCULAR

## 2020-07-31 NOTE — Progress Notes (Signed)
Patient here today for Depo Provera injection and is within her dates.    Last contraceptive appt was 05/15/20  Depo given in [place] today.  Site unremarkable & patient tolerated injection.    Next injection due 6/7-6/21.  Reminder card given.    Talbot Grumbling, RN

## 2020-08-01 LAB — BASIC METABOLIC PANEL
BUN/Creatinine Ratio: 10 (ref 9–23)
BUN: 15 mg/dL (ref 6–24)
CO2: 19 mmol/L — ABNORMAL LOW (ref 20–29)
Calcium: 8.8 mg/dL (ref 8.7–10.2)
Chloride: 108 mmol/L — ABNORMAL HIGH (ref 96–106)
Creatinine, Ser: 1.57 mg/dL — ABNORMAL HIGH (ref 0.57–1.00)
Glucose: 348 mg/dL — ABNORMAL HIGH (ref 65–99)
Potassium: 4.8 mmol/L (ref 3.5–5.2)
Sodium: 142 mmol/L (ref 134–144)
eGFR: 41 mL/min/{1.73_m2} — ABNORMAL LOW (ref >59)

## 2020-08-14 DIAGNOSIS — F250 Schizoaffective disorder, bipolar type: Secondary | ICD-10-CM | POA: Diagnosis not present

## 2020-08-23 ENCOUNTER — Other Ambulatory Visit: Payer: Self-pay | Admitting: Family Medicine

## 2020-08-26 ENCOUNTER — Encounter: Payer: Self-pay | Admitting: Family Medicine

## 2020-08-27 ENCOUNTER — Other Ambulatory Visit: Payer: Self-pay | Admitting: *Deleted

## 2020-08-27 MED ORDER — SITAGLIPTIN PHOSPHATE 50 MG PO TABS
ORAL_TABLET | ORAL | 3 refills | Status: DC
Start: 2020-08-27 — End: 2020-10-29

## 2020-08-30 ENCOUNTER — Ambulatory Visit: Payer: Medicare Other

## 2020-09-07 DIAGNOSIS — F250 Schizoaffective disorder, bipolar type: Secondary | ICD-10-CM | POA: Diagnosis not present

## 2020-09-07 DIAGNOSIS — R7309 Other abnormal glucose: Secondary | ICD-10-CM | POA: Diagnosis not present

## 2020-09-07 DIAGNOSIS — E782 Mixed hyperlipidemia: Secondary | ICD-10-CM | POA: Diagnosis not present

## 2020-09-11 DIAGNOSIS — F250 Schizoaffective disorder, bipolar type: Secondary | ICD-10-CM | POA: Diagnosis not present

## 2020-09-12 DIAGNOSIS — F331 Major depressive disorder, recurrent, moderate: Secondary | ICD-10-CM | POA: Diagnosis not present

## 2020-10-01 ENCOUNTER — Other Ambulatory Visit: Payer: Self-pay | Admitting: Family Medicine

## 2020-10-11 ENCOUNTER — Other Ambulatory Visit: Payer: Self-pay | Admitting: Family Medicine

## 2020-10-12 DIAGNOSIS — Z79899 Other long term (current) drug therapy: Secondary | ICD-10-CM | POA: Diagnosis not present

## 2020-10-16 DIAGNOSIS — F70 Mild intellectual disabilities: Secondary | ICD-10-CM | POA: Diagnosis not present

## 2020-10-17 ENCOUNTER — Ambulatory Visit: Payer: Medicare Other

## 2020-10-17 ENCOUNTER — Other Ambulatory Visit: Payer: Self-pay

## 2020-10-17 ENCOUNTER — Ambulatory Visit
Admission: RE | Admit: 2020-10-17 | Discharge: 2020-10-17 | Disposition: A | Discharge status: Home / Self Care | Payer: Medicare Other | Source: Ambulatory Visit | Attending: Family Medicine | Admitting: Family Medicine

## 2020-10-17 DIAGNOSIS — Z1231 Encounter for screening mammogram for malignant neoplasm of breast: Secondary | ICD-10-CM

## 2020-10-19 ENCOUNTER — Other Ambulatory Visit: Payer: Self-pay | Admitting: Family Medicine

## 2020-10-19 DIAGNOSIS — R928 Other abnormal and inconclusive findings on diagnostic imaging of breast: Secondary | ICD-10-CM

## 2020-10-25 NOTE — Progress Notes (Signed)
Patient and caregiver have been notified. Follow up appointments are scheduled for 7/28 at [facility].

## 2020-10-29 ENCOUNTER — Other Ambulatory Visit: Payer: Self-pay | Admitting: *Deleted

## 2020-10-30 MED ORDER — SITAGLIPTIN PHOSPHATE 50 MG PO TABS: ORAL_TABLET | ORAL | 1 refills | Status: DC

## 2020-10-30 NOTE — Telephone Encounter (Signed)
Please let patient's caregiver know I am refilling this medication, but she needs to schedule an appointment with me.   Thanks, Leeanne Rio, MD

## 2020-11-09 DIAGNOSIS — Z79899 Other long term (current) drug therapy: Secondary | ICD-10-CM | POA: Diagnosis not present

## 2020-11-21 ENCOUNTER — Other Ambulatory Visit: Payer: Self-pay

## 2020-11-21 ENCOUNTER — Ambulatory Visit
Admission: RE | Admit: 2020-11-21 | Discharge: 2020-11-21 | Disposition: A | Discharge status: Home / Self Care | Payer: Medicare Other | Source: Ambulatory Visit | Attending: Family Medicine | Admitting: Family Medicine

## 2020-11-21 ENCOUNTER — Ambulatory Visit: Payer: Medicare Other

## 2020-11-21 DIAGNOSIS — R928 Other abnormal and inconclusive findings on diagnostic imaging of breast: Secondary | ICD-10-CM

## 2020-11-21 DIAGNOSIS — R922 Inconclusive mammogram: Secondary | ICD-10-CM | POA: Diagnosis not present

## 2020-11-26 ENCOUNTER — Other Ambulatory Visit: Payer: Self-pay | Admitting: Family Medicine

## 2020-11-27 DIAGNOSIS — F250 Schizoaffective disorder, bipolar type: Secondary | ICD-10-CM | POA: Diagnosis not present

## 2020-12-03 ENCOUNTER — Other Ambulatory Visit: Payer: Self-pay | Admitting: Family Medicine

## 2020-12-06 ENCOUNTER — Telehealth: Payer: Self-pay | Admitting: Family Medicine

## 2020-12-06 ENCOUNTER — Encounter: Payer: Self-pay | Admitting: Family Medicine

## 2020-12-06 ENCOUNTER — Other Ambulatory Visit: Payer: Self-pay

## 2020-12-06 ENCOUNTER — Ambulatory Visit (INDEPENDENT_AMBULATORY_CARE_PROVIDER_SITE_OTHER): Payer: Medicare Other

## 2020-12-06 ENCOUNTER — Other Ambulatory Visit: Payer: Medicare Other

## 2020-12-06 ENCOUNTER — Ambulatory Visit (INDEPENDENT_AMBULATORY_CARE_PROVIDER_SITE_OTHER): Payer: Medicare Other | Admitting: Family Medicine

## 2020-12-06 VITALS — BP 110/70 | HR 106 | Ht 65.0 in | Wt 131.0 lb

## 2020-12-06 DIAGNOSIS — F209 Schizophrenia, unspecified: Secondary | ICD-10-CM | POA: Diagnosis not present

## 2020-12-06 DIAGNOSIS — Z23 Encounter for immunization: Secondary | ICD-10-CM | POA: Diagnosis not present

## 2020-12-06 DIAGNOSIS — Z1211 Encounter for screening for malignant neoplasm of colon: Secondary | ICD-10-CM

## 2020-12-06 DIAGNOSIS — Z3042 Encounter for surveillance of injectable contraceptive: Secondary | ICD-10-CM | POA: Diagnosis not present

## 2020-12-06 DIAGNOSIS — E1122 Type 2 diabetes mellitus with diabetic chronic kidney disease: Secondary | ICD-10-CM | POA: Diagnosis not present

## 2020-12-06 DIAGNOSIS — N183 Chronic kidney disease, stage 3 unspecified: Secondary | ICD-10-CM

## 2020-12-06 LAB — POCT GLYCOSYLATED HEMOGLOBIN (HGB A1C): HbA1c, POC (controlled diabetic range): 6.5 % (ref 0.0–7.0)

## 2020-12-06 LAB — POCT URINE PREGNANCY: Preg Test, Ur: NEGATIVE

## 2020-12-06 MED ORDER — MEDROXYPROGESTERONE ACETATE 150 MG/ML IM SUSY
150.0000 mg | PREFILLED_SYRINGE | Freq: Once | INTRAMUSCULAR | Status: AC
  Administered 2020-12-06: 150 mg via INTRAMUSCULAR

## 2020-12-06 NOTE — Progress Notes (Signed)
Patient presents for Depo-Provera.  Patient given urine pregnancy test.  Test negative.  Injection given in [place].  Patient tolerated well.  Next shot due Oct 12-Oct 26  .Ozella Almond, CMA'

## 2020-12-06 NOTE — Progress Notes (Signed)
error 

## 2020-12-06 NOTE — Assessment & Plan Note (Signed)
Well controlled on current medication regimen.  HbA1c today is 6.5

## 2020-12-06 NOTE — Telephone Encounter (Signed)
FL2, physical form, standing medication orders, and certificate of medical necessity forms completed (caregiver brought them to patient's visit today).  Will return to [facility] RN team - copies will need to be scanned into chart, and patient's caregiver would like to pick them up.  Leeanne Rio, MD

## 2020-12-06 NOTE — Assessment & Plan Note (Addendum)
Seeing psychiatrist next Tuesday 8/2 Continue prescribed medications Started haloperidol 17m since last visit No active SI/HI today - stable to see psych next week

## 2020-12-06 NOTE — Patient Instructions (Signed)
It was great to see you again today!  Referring for colonoscopy COVID booster and pneumonia shots today Will get eye exam records  Follow up with me in 3 months, sooner if needed  Be well, [name]

## 2020-12-06 NOTE — Progress Notes (Signed)
    SUBJECTIVE:   CHIEF COMPLAINT / HPI:  [name] is presenting for scheduled follow up. Her Caregiver, [name], is facilitating history gathering.  Diabetes: No concerns today. [name] has been helping [name] keep up a healthy diet. [name] feels [name] has been eating better but also notes that [name] often throws away some of the healthy food. [name] doesn't share why she throws it away but says she likes the food [employer]. She eats three meals a day, eats breakfast, gets sandwiches at her day program for lunch, dinner and snacks throughout the day. Current medications include glipizide 59m twice daily and januvia 536mdaily. Denies low sugars. A1c today 6.5.  Psych: Since last visit, Hasna started 5 mg haloperidol. [name] started a new day program 1.5 weeks ago. She told Jacqueline Medianhe didn't like it but says she likes it today. She hasn't been sleeping well recently and ends up sleeping during the day time. [name] this is typical for [name], but [name] been complaining about it more. On Monday of this week [name] told Jacqueline MedianI want to die". She hasn't said anything like that to [name] and when asked if she wanted to hurt herself today [name] "No" and "I don't want that." Overally since Monday, [name] hasn't been as much herself per Jacqueline MedianArShlokaas an appointment with psychiatry next Tuesday.   Past due for depo  Has FL2 and other group home forms for me to fill out today  PERTINENT  PMH / PSH:  Schizophrenia CKD Stage III T2D  OBJECTIVE:   BP 110/70   Pulse (!) 106   Ht 5' 5"  (1.651 m)   Wt 131 lb (59.4 kg)   SpO2 98%   BMI 21.80 kg/m    GEN: Well appearing, in no acute distress, alert and oriented x 4, pleasant, cooperative HEENT: NCAT, PERRL, no lymphadenopathy CARD: Normal rate, rhythm, no murmurs or gallops PULM: Clear to auscultation, normal work of breathing ABD: Soft, non tender to palpation EXT: No edema bilaterally SKIN: Warm, dry throughout PSYCH: Mood is  "sleepy", normal range of affect, well groomed, speech normal in rate, low in volume, intermittent eye contact    ASSESSMENT/PLAN:   Type 2 diabetes mellitus with renal manifestations ([place] controlled on current medication regimen.  HbA1c today is 6.5  [facility] Continue prescribed medications Started haloperidol 26m24mince last visit No active SI/HI today - stable to see psych next week  Contraception management upreg negative today. Late for depo, given today 7/28.  Health Maintenance - COVID booster and prevnar 20 given today - referral placed for colonoscopy - will request records from eye exam (Feb, Dr. WhiVenetia MaxonFilled out [facility], medical approval for discounted gluChebanse Patient seen along with MS3 student [name] personally evaluated this patient along with the student, and verified all aspects of the history, physical exam, and medical decision making as documented by the student. I agree with the student's documentation and have made all necessary edits.  BriChrisandra NettersD  [place]

## 2020-12-07 DIAGNOSIS — Z79899 Other long term (current) drug therapy: Secondary | ICD-10-CM | POA: Diagnosis not present

## 2020-12-07 NOTE — Telephone Encounter (Signed)
Called caregiver. Informed that paperwork has been completed. Copy made and placed in batch scanning. Original at front desk for pick up.   Talbot Grumbling, RN

## 2020-12-11 DIAGNOSIS — F70 Mild intellectual disabilities: Secondary | ICD-10-CM | POA: Diagnosis not present

## 2020-12-11 NOTE — Addendum Note (Signed)
Addended by: Leeanne Rio on: 12/11/2020 11:22 PM   Modules accepted: Orders

## 2020-12-11 NOTE — Assessment & Plan Note (Signed)
upreg negative today. Late for depo, given today 7/28.

## 2020-12-21 ENCOUNTER — Telehealth: Payer: Self-pay

## 2020-12-21 NOTE — Telephone Encounter (Signed)
Spoke to [employer] and scheduled pt for AWV 8.14 at 11am.

## 2020-12-23 ENCOUNTER — Ambulatory Visit (INDEPENDENT_AMBULATORY_CARE_PROVIDER_SITE_OTHER): Payer: Medicare Other

## 2020-12-23 DIAGNOSIS — Z0000 Encounter for general adult medical examination without abnormal findings: Secondary | ICD-10-CM

## 2020-12-23 NOTE — Patient Instructions (Addendum)
  Ms. Outten , Thank you for taking time to come for your Medicare Wellness Visit. I appreciate your ongoing commitment to your health goals. Please review the following plan we discussed and let me know if I can assist you in the future.   These are the goals we discussed:  Goals   None     This is a list of the screening recommended for you and due dates:  Health Maintenance  Topic Date Due   Pneumococcal Vaccination (2 - PCV) 02/23/2015   Colon Cancer Screening  Never done   Eye exam for diabetics  06/21/2020   Flu Shot  08/09/2021*   COVID-19 Vaccine (5 - Booster for Moderna series) 04/08/2021   Complete foot exam   05/15/2021   Hemoglobin A1C  06/08/2021   Tetanus Vaccine  08/06/2021   Pap Smear  05/31/2022   Pneumococcal vaccine  Completed   Hepatitis C Screening: USPSTF Recommendation to screen - Ages 18-79 yo.  Completed   HIV Screening  Completed   HPV Vaccine  Aged Out  *Topic was postponed. The date shown is not the original due date.

## 2020-12-23 NOTE — Progress Notes (Signed)
Subjective:   [name] is a 45 y.o. female who presents for an Initial Medicare Annual Wellness Visit.  I connected with  Kathie Dike and Coralyn Helling (Caregiver) on 12/23/20 by audio enabled telemedicine application and verified that I am speaking with the correct person using two identifiers.   I discussed the limitations of evaluation and management by telemedicine. The patient expressed understanding and agreed to proceed.   Review of Systems    Defer to PCP  Cardiac Risk Factors include: diabetes mellitus     Objective:    Today's Vitals   12/23/20 1135  PainSc: 2    There is no height or weight on file to calculate BMI.  Advanced Directives 12/23/2020 02/28/2020 02/13/2020 12/13/2019 01/18/2019 07/14/2018 07/09/2018  Does Patient Have a Medical Advance Directive? No No No No No No No  Type of Advance Directive - - - - - - -  Would patient like information on creating a medical advance directive? No - Guardian declined No - Patient declined No - Patient declined No - Patient declined No - Patient declined - No - Patient declined    Current Medications (verified) Outpatient Encounter Medications as of 12/23/2020  Medication Sig   atorvastatin (LIPITOR) 20 MG tablet TAKE ONE TABLET EACH DAY   benztropine (COGENTIN) 1 MG tablet Take 1 mg by mouth daily.   Blood Glucose Monitoring Suppl (ONETOUCH VERIO) w/Device KIT Check blood sugar once daily   clozapine (FAZACLO) 100 MG disintegrating tablet Take 400 mg by mouth at bedtime.    divalproex (DEPAKOTE) 500 MG 24 hr tablet Take 1,000 mg by mouth daily.   docusate sodium (COLACE) 100 MG capsule TAKE ONE CAPSULE EACH DAY   Emollient (CETAPHIL) cream APPLY DAILY   famotidine (PEPCID) 20 MG tablet TAKE ONE TABLET TWICE DAILY   GAS-X EXTRA STRENGTH 125 MG chewable tablet TAKE ONE TABLET DAILY AS NEEDED FOR GAS   glipiZIDE (GLUCOTROL) 10 MG tablet TAKE ONE TABLET TWICE DAILY BEFORE MEALS   glucose blood (ONETOUCH VERIO) test  strip Check blood sugar once daily   GOODSENSE ASPIRIN 81 MG chewable tablet CHEW 1 TABLET DAILY   haloperidol (HALDOL) 5 MG tablet Take 5 mg by mouth.   Incontinence Supply Disposable (INCONTINENCE BRIEF MEDIUM) MISC Wear every night   Lancet Devices (ONE TOUCH DELICA LANCING DEV) MISC Check blood sugar once daily   lisinopril (ZESTRIL) 2.5 MG tablet TAKE ONE TABLET AT BEDTIME   medroxyPROGESTERone (DEPO-PROVERA) 150 MG/ML injection Inject 150 mg into the muscle every 3 (three) months.   metoprolol succinate (TOPROL-XL) 25 MG 24 hr tablet TAKE ONE TABLET DAILY   OneTouch Delica Lancets 68L MISC Check blood sugar once daily   PARoxetine (PAXIL) 10 MG tablet Take 10 mg by mouth daily.   polyethylene glycol powder (GLYCOLAX/MIRALAX) 17 GM/SCOOP powder TAKE 17G AT BEDTIME -CAN GO UPTO 3 TIMESDAILY IF NEEDED   QC PAIN RELIEF EXTRA STRENGTH 500 MG tablet TAKE ONE TABLET EVERY EIGHT HOURS AS NEEDED FOR MILD PAIN OR FEVER   QUEtiapine (SEROQUEL) 25 MG tablet Take 25 mg by mouth at bedtime.   sitaGLIPtin (JANUVIA) 50 MG tablet TAKE ONE TABLET EACH DAY   zolpidem (AMBIEN) 5 MG tablet Take 5 mg by mouth at bedtime as needed.   [DISCONTINUED] zolpidem (AMBIEN) 10 MG tablet Take 10 mg by mouth at bedtime. (Patient not taking: Reported on 12/23/2020)   No facility-administered encounter medications on file as of 12/23/2020.    Allergies (verified) Minocycline  History: Past Medical History:  Diagnosis Date   Acne    Anemia    Cognitive impairment    Diabetes mellitus without complication ([place])    Internal hemorrhoid, bleeding 10/22/2010   Obesity    Schizoaffective disorder    SVT (supraventricular tachycardia) (HCC)    with short PR interval - followed by Dr. Lovena Le   Past Surgical History:  Procedure Laterality Date   BREAST REDUCTION SURGERY  08/10/2005   HYSTEROSCOPY  09/09/2004   w/ removal of polyps   KIDNEY SURGERY  05/17/2020   Tumor removal -   REDUCTION MAMMAPLASTY Bilateral  2007   SPINE SURGERY N/A 04/18/2020   Dr. Arnoldo Morale - pinched nerve of Lumbar Spine   Family History  Problem Relation Age of Onset   Schizophrenia Sister    Social History   Socioeconomic History   Marital status: Single    Spouse name: Not on file   Number of children: Not on file   Years of education: 10 years   Highest education level: 10th grade  Occupational History   Occupation: Disabled  Tobacco Use   Smoking status: Never   Smokeless tobacco: Never  Vaping Use   Vaping Use: Never used  Substance and Sexual Activity   Alcohol use: No   Drug use: No   Sexual activity: Never    Birth control/protection: Injection  Other Topics Concern   Not on file  Social History Narrative   Caregiver is Tax adviser. Lives in [facility] in [place], [phone]. Previously lived in group home. Twin sister with similar problem list. Has 24H care. Case manager is Justin Mend, [phone].      Patient recently started going to [employer]. Phone: [phone]. [address] [address]      Caregiver works for [employer], [address] [place], [place] [address].       Guardian is Delaney Meigs of [employer] [place] [phone] office. After hours service [phone]   Social Determinants of Health   Financial Resource Strain: Low Risk    Difficulty of Paying Living Expenses: Not hard at all  Food Insecurity: No Food Insecurity   Worried About Charity fundraiser in the Last Year: Never true   Arboriculturist in the Last Year: Never true  Transportation Needs: No Transportation Needs   Lack of Transportation (Medical): No   Lack of Transportation (Non-Medical): No  Physical Activity: Insufficiently Active   Days of Exercise per Week: 5 days   Minutes of Exercise per Session: 10 min  Stress: Stress Concern Present   Feeling of Stress : To some extent  Social Connections: Socially Isolated   Frequency of Communication with  Friends and Family: Once a week   Frequency of Social Gatherings with Friends and Family: Once a week   Attends Religious Services: 1 to 4 times per year   Active Member of [employer] or Organizations: No   Attends Music therapist: Never   Marital Status: Never married    Tobacco Counseling Counseling given: Not Answered   Clinical Intake:  Pre-visit preparation completed: Yes  Pain : 0-10 Pain Score: 2  Pain Type: Chronic pain Pain Location: Back Pain Orientation: Mid Pain Radiating Towards: Does not radiate Pain Descriptors / Indicators: Dull Pain Onset: 1 to 4 weeks ago Pain Frequency: Rarely Pain Relieving Factors: Patient doesn't like medication. Effect of Pain on Daily Activities: Does not affect ADL's  Pain Relieving Factors:  Patient doesn't like medication.  Nutritional Risks: None Diabetes: Yes CBG done?: Yes CBG resulted in Enter/ Edit results?: No Did pt. bring in CBG monitor from home?: No  How often do you need to have someone help you when you read instructions, pamphlets, or other written materials from your doctor or pharmacy?: 5 - Always  Diabetic? Yes  Interpreter Needed?: Yes Patient Declined Interpreter : No Patient signed [place] waiver: No  Comments: Care Giver assisted with Visit Information entered by :: [employer], Portal   Activities of Daily Living In your present state of health, do you have any difficulty performing the following activities: 12/23/2020  Hearing? Y  Vision? N  Difficulty concentrating or making decisions? Y  Walking or climbing stairs? N  Dressing or bathing? N  Doing errands, shopping? Y  Preparing Food and eating ? Y  Using the Toilet? N  In the past six months, have you accidently leaked urine? Y  Comment Uses briefs at night.  Do you have problems with loss of bowel control? N  Managing your Medications? Y  Managing your Finances? Y  Housekeeping or managing your Housekeeping? Y   Some recent data might be hidden    Patient Care Team: Leeanne Rio, MD as PCP - General (Pediatrics) Marylynn Pearson, MD as Consulting Physician (Ophthalmology)  Indicate any recent Medical Services you may have received from other than [facility] providers in the past year (date may be approximate).     Assessment:   This is a routine wellness examination for [place].  Hearing/Vision screen No results found.  Dietary issues and exercise activities discussed: Current Exercise Habits: Home exercise routine, Type of exercise: walking, Time (Minutes): 10, Frequency (Times/Week): 5, Weekly Exercise (Minutes/Week): 50, Intensity: Mild, Exercise limited by: orthopedic condition(s)   Goals Addressed   None   Depression Screen PHQ 2/9 Scores 12/23/2020 12/06/2020 07/03/2020 05/15/2020 03/27/2020 01/12/2020 12/13/2019  PHQ - 2 Score 2 2 0 0 1 2 1   PHQ- 9 Score 15 9 1 1 4 7 8   Exception Documentation - - - - - - -    Fall Risk Fall Risk  12/23/2020 02/28/2020 02/13/2020 01/12/2020 12/13/2019  Falls in the past year? 0 0 0 0 0  Number falls in past yr: 0 0 0 0 0  Injury with Fall? 0 0 0 - 0  Risk for fall due to : No Fall Risks - - - -  Follow up Falls evaluation completed - - Falls evaluation completed -    FALL RISK PREVENTION PERTAINING TO THE HOME:  Any stairs in or around the home? Yes  If so, are there any without handrails? Yes  Home free of loose throw rugs in walkways, pet beds, electrical cords, etc? Yes  Adequate lighting in your home to reduce risk of falls? Yes   ASSISTIVE DEVICES UTILIZED TO PREVENT FALLS:  Life alert? No  Use of a cane, walker or w/c? No  Grab bars in the bathroom? No  Shower chair or bench in shower? No  Elevated toilet seat or a handicapped toilet? No   TIMED UP AND GO:  Was the test performed?  N/A .  Length of time to ambulate 10 feet: N/A sec.   Per Patient and Care Giver: Gait steady and fast without use of assistive device.Patient states that  she feels unsteady at time.   Cognitive Function:     6CIT Screen 12/23/2020  What Year? 4 points  What month? 0 points  What time? 3 points  Count back from 20 4 points  Months in reverse 4 points  Repeat phrase 4 points  Total Score 19    Immunizations Immunization History  Administered Date(s) Administered   Influenza Split 03/10/2011, 03/02/2012   Influenza Whole 02/26/2009   Influenza,inj,Quad PF,6+ Mos 02/14/2013, 01/18/2014, 04/13/2015, 01/28/2016, 03/31/2017, 01/19/2018, 01/18/2019, 02/28/2020   Moderna Sars-Covid-2 Vaccination 07/11/2019, 08/08/2019, 03/12/2020   PFIZER Comirnaty(Gray Top)Covid-19 Tri-Sucrose Vaccine 12/06/2020   PNEUMOCOCCAL CONJUGATE-20 12/06/2020   Pneumococcal Polysaccharide-23 02/22/2014   Td 11/10/1998   Tdap 08/07/2011    TDAP status: Up to date  Flu Vaccine status: Due, Education has been provided regarding the importance of this vaccine. Advised may receive this vaccine at local pharmacy or Health Dept. Aware to provide a copy of the vaccination record if obtained from local pharmacy or Health Dept. Verbalized acceptance and understanding.  Pneumococcal vaccine status: Up to date  Covid-19 vaccine status: Completed vaccines  Qualifies for Shingles Vaccine? No   Zostavax completed  N/A   Shingrix Completed?: No.    Education has been provided regarding the importance of this vaccine. Patient has been advised to call insurance company to determine out of pocket expense if they have not yet received this vaccine. Advised may also receive vaccine at local pharmacy or Health Dept. Verbalized acceptance and understanding.  Screening Tests Health Maintenance  Topic Date Due   Pneumococcal Vaccine 52-74 Years old (2 - PCV) 02/23/2015   COLONOSCOPY (Pts 45-34yr Insurance coverage will need to be confirmed)  Never done   OPHTHALMOLOGY EXAM  06/21/2020   INFLUENZA VACCINE  08/09/2021 (Originally 12/10/2020)   COVID-19 Vaccine (5 - Booster for  Moderna series) 04/08/2021   FOOT EXAM  05/15/2021   HEMOGLOBIN A1C  06/08/2021   TETANUS/TDAP  08/06/2021   PAP SMEAR-Modifier  05/31/2022   PNEUMOCOCCAL POLYSACCHARIDE VACCINE AGE 40-64 HIGH RISK  Completed   Hepatitis C Screening  Completed   HIV Screening  Completed   HPV VACCINES  Aged Out    Health Maintenance  Health Maintenance Due  Topic Date Due   Pneumococcal Vaccine 029664Years old (2 - PCV) 02/23/2015   COLONOSCOPY (Pts 45-416yrInsurance coverage will need to be confirmed)  Never done   OPHTHALMOLOGY EXAM  06/21/2020    Colorectal Cancer Screening referral entered 12/11/2020 to [facility] Mammogram is not due until age 47 Bone Density is not due  Lung Cancer Screening: (Low Dose CT Chest recommended if Age 47-80ears, 30 pack-year currently smoking OR have quit w/in 15years.) does not qualify.   Lung Cancer Screening Referral: N/A  Additional Screening:  Hepatitis C Screening: does qualify; Completed 12/13/2019  Vision Screening: Recommended annual ophthalmology exams for early detection of glaucoma and other disorders of the eye. Is the patient up to date with their annual eye exam?  No  Who is the provider or what is the name of the office in which the patient attends annual eye exams? [facility] is not established with a provider, would they like to be referred to a provider to establish care?  Care Giver is looking for a new eye care provider  Dental Screening: Recommended annual dental exams for proper oral hygiene  Community Resource Referral / Chronic Care Management: CRR required this visit?  No   CCM required this visit?  No      Plan:     I have personally reviewed and noted the following in the patient's  chart:   Medical and social history Use of alcohol, tobacco or illicit drugs  Current medications and supplements including opioid prescriptions. Patient is not currently taking opioid  prescriptions. Functional ability and status Nutritional status Physical activity Advanced directives List of other physicians Hospitalizations, surgeries, and ER visits in previous 12 months Vitals Screenings to include cognitive, depression, and falls Referrals and appointments  In addition, I have reviewed and discussed with patient certain preventive protocols, quality metrics, and best practice recommendations. A written personalized care plan for preventive services as well as general preventive health recommendations were provided to patient.     Tawni Pummel, [place]   12/23/2020   Nurse Notes: Care giver, Arleene Settle, stated concerns about paying out of pocket for incontinence supplies. I did give her numbers for Lorita Officer and [employer].   Ms. Olafson , Thank you for taking time to come for your Medicare Wellness Visit. I appreciate your ongoing commitment to your health goals. Please review the following plan we discussed and let me know if I can assist you in the future.   These are the goals we discussed:  Goals   None     This is a list of the screening recommended for you and due dates:  Health Maintenance  Topic Date Due   Pneumococcal Vaccination (2 - PCV) 02/23/2015   Colon Cancer Screening  Never done   Eye exam for diabetics  06/21/2020   Flu Shot  08/09/2021*   COVID-19 Vaccine (5 - Booster for Moderna series) 04/08/2021   Complete foot exam   05/15/2021   Hemoglobin A1C  06/08/2021   Tetanus Vaccine  08/06/2021   Pap Smear  05/31/2022   Pneumococcal vaccine  Completed   Hepatitis C Screening: USPSTF Recommendation to screen - Ages 18-79 yo.  Completed   HIV Screening  Completed   HPV Vaccine  Aged Out  *Topic was postponed. The date shown is not the original due date.

## 2020-12-27 DIAGNOSIS — H43813 Vitreous degeneration, bilateral: Secondary | ICD-10-CM | POA: Diagnosis not present

## 2020-12-27 DIAGNOSIS — H40023 Open angle with borderline findings, high risk, bilateral: Secondary | ICD-10-CM | POA: Diagnosis not present

## 2020-12-27 DIAGNOSIS — E119 Type 2 diabetes mellitus without complications: Secondary | ICD-10-CM | POA: Diagnosis not present

## 2020-12-28 ENCOUNTER — Other Ambulatory Visit: Payer: Self-pay

## 2020-12-30 MED ORDER — SITAGLIPTIN PHOSPHATE 50 MG PO TABS: ORAL_TABLET | ORAL | 1 refills | Status: DC

## 2021-01-01 NOTE — Progress Notes (Signed)
I discussed the limitations, risks, security and privacy concerns of performing an evaluation and management service by telephone and the availability of in person appointments. I also discussed with the patient that there may be a patient responsible charge related to this service. The patient expressed understanding and verbally consented to this telephonic visit.  Location of Patient: Home Location of Provider: Office  List any persons and their role that are participating in the visit with the patient: none

## 2021-01-08 DIAGNOSIS — F209 Schizophrenia, unspecified: Secondary | ICD-10-CM | POA: Diagnosis not present

## 2021-01-08 DIAGNOSIS — E782 Mixed hyperlipidemia: Secondary | ICD-10-CM | POA: Diagnosis not present

## 2021-01-08 DIAGNOSIS — R7309 Other abnormal glucose: Secondary | ICD-10-CM | POA: Diagnosis not present

## 2021-01-08 DIAGNOSIS — F70 Mild intellectual disabilities: Secondary | ICD-10-CM | POA: Diagnosis not present

## 2021-01-08 DIAGNOSIS — Z79899 Other long term (current) drug therapy: Secondary | ICD-10-CM | POA: Diagnosis not present

## 2021-01-10 LAB — COLOGUARD

## 2021-01-28 ENCOUNTER — Other Ambulatory Visit: Payer: Self-pay

## 2021-01-28 LAB — COLOGUARD

## 2021-01-29 MED ORDER — SITAGLIPTIN PHOSPHATE 50 MG PO TABS: ORAL_TABLET | ORAL | 1 refills | Status: DC

## 2021-02-26 ENCOUNTER — Other Ambulatory Visit: Payer: Self-pay

## 2021-02-26 ENCOUNTER — Encounter: Payer: Self-pay | Admitting: Family Medicine

## 2021-02-26 ENCOUNTER — Ambulatory Visit (INDEPENDENT_AMBULATORY_CARE_PROVIDER_SITE_OTHER): Payer: Medicare Other

## 2021-02-26 ENCOUNTER — Ambulatory Visit (INDEPENDENT_AMBULATORY_CARE_PROVIDER_SITE_OTHER): Payer: Medicare Other | Admitting: Family Medicine

## 2021-02-26 VITALS — BP 103/79 | HR 80 | Ht 65.0 in | Wt 133.0 lb

## 2021-02-26 DIAGNOSIS — N183 Chronic kidney disease, stage 3 unspecified: Secondary | ICD-10-CM | POA: Diagnosis not present

## 2021-02-26 DIAGNOSIS — Z3042 Encounter for surveillance of injectable contraceptive: Secondary | ICD-10-CM

## 2021-02-26 DIAGNOSIS — Z23 Encounter for immunization: Secondary | ICD-10-CM | POA: Diagnosis not present

## 2021-02-26 DIAGNOSIS — E1122 Type 2 diabetes mellitus with diabetic chronic kidney disease: Secondary | ICD-10-CM

## 2021-02-26 DIAGNOSIS — F250 Schizoaffective disorder, bipolar type: Secondary | ICD-10-CM | POA: Diagnosis not present

## 2021-02-26 LAB — POCT GLYCOSYLATED HEMOGLOBIN (HGB A1C): HbA1c, POC (controlled diabetic range): 7.1 % — AB (ref 0.0–7.0)

## 2021-02-26 MED ORDER — MEDROXYPROGESTERONE ACETATE 150 MG/ML IM SUSY
150.0000 mg | PREFILLED_SYRINGE | Freq: Once | INTRAMUSCULAR | Status: AC
  Administered 2021-02-26: 150 mg via INTRAMUSCULAR

## 2021-02-26 NOTE — Progress Notes (Signed)
Patient presents for Depo shot and was within dates.  Patient given Depo-Provera in [place] and tolerated well.  Patient's caregiver given reminder card for next shot Jan- 3-Jan 17.  Ozella Almond, [place]

## 2021-02-26 NOTE — Patient Instructions (Signed)
It was great to see you again today!  Stay on current medications Have psychiatry provider send me labwork  Keep trying with the cologuard  Follow up with me in March, sooner if needed  Be well, [name]

## 2021-02-26 NOTE — Progress Notes (Signed)
  Date of Visit: 02/26/2021   SUBJECTIVE:   HPI:  [name] presents today for follow up of diabetes.  Diabetes - currently taking januvia 76m daily, glipizide 177mtwice daily, and lisinopril 2.62m74maily. No low sugars. Overall doing well. A1c today is 7.1. She and her caregiver Jacqueline Mediane pleased to hear this.  Psychiatry labs - had labs ordered by her psychiatry provider (who I believe is an NP) and evidently there were some concerns raised on her labwork. Caregiver Jacqueline Medianquested a copy be faxed to me, but I have not received these labs. Jacqueline Median unsure which labs were abnormal.  OBJECTIVE:   BP 103/79   Pulse 80   Ht 5' 5"  (1.651 m)   Wt 133 lb (60.3 kg)   SpO2 100%   BMI 22.13 kg/m  Gen: no acute distress, pleasant, cooperative HEENT: normocephalic, atraumatic  Heart: regular rate and rhythm, no murmur Lungs: clear to auscultation bilaterally, normal work of breathing  Neuro: alert, grossly nonfocal, speech normal  ASSESSMENT/PLAN:   Health maintenance:  -encouraged attempts to obtain cologuard sample at home -will request eye exam results from Dr. WhiVenetia Maxonab abnormalities AdvBelinda Prince psych NP sends labs I will be happy to review them.  Type 2 diabetes mellitus with renal manifestations (HCC) Well controlled. Continue current medication regimen.  Follow up March or April  FOLLOW UP: Follow up in March or April for diabetes, sooner if needed  BriTanzania McIArdelia MemsD [place]

## 2021-02-28 ENCOUNTER — Other Ambulatory Visit: Payer: Self-pay

## 2021-03-05 NOTE — Assessment & Plan Note (Signed)
Well controlled. Continue current medication regimen.  Follow up March or April

## 2021-03-11 NOTE — Telephone Encounter (Signed)
Medication filled.  [name], [place]

## 2021-03-16 DIAGNOSIS — Z1211 Encounter for screening for malignant neoplasm of colon: Secondary | ICD-10-CM | POA: Diagnosis not present

## 2021-03-16 DIAGNOSIS — Z1212 Encounter for screening for malignant neoplasm of rectum: Secondary | ICD-10-CM | POA: Diagnosis not present

## 2021-03-16 LAB — COLOGUARD: Cologuard: NEGATIVE

## 2021-03-20 ENCOUNTER — Emergency Department (HOSPITAL_COMMUNITY): Payer: Medicare Other

## 2021-03-20 ENCOUNTER — Encounter (HOSPITAL_COMMUNITY): Payer: Self-pay

## 2021-03-20 ENCOUNTER — Inpatient Hospital Stay (HOSPITAL_COMMUNITY)
Admission: EM | Admit: 2021-03-20 | Discharge: 2021-03-25 | DRG: 100 | Disposition: A | Discharge status: Home / Self Care | Payer: Medicare Other | Attending: Family Medicine | Admitting: Family Medicine

## 2021-03-20 ENCOUNTER — Observation Stay (HOSPITAL_COMMUNITY): Payer: Medicare Other

## 2021-03-20 DIAGNOSIS — E785 Hyperlipidemia, unspecified: Secondary | ICD-10-CM | POA: Diagnosis present

## 2021-03-20 DIAGNOSIS — Z66 Do not resuscitate: Secondary | ICD-10-CM | POA: Diagnosis present

## 2021-03-20 DIAGNOSIS — T43595A Adverse effect of other antipsychotics and neuroleptics, initial encounter: Secondary | ICD-10-CM | POA: Diagnosis present

## 2021-03-20 DIAGNOSIS — F79 Unspecified intellectual disabilities: Secondary | ICD-10-CM | POA: Diagnosis present

## 2021-03-20 DIAGNOSIS — F259 Schizoaffective disorder, unspecified: Secondary | ICD-10-CM | POA: Diagnosis present

## 2021-03-20 DIAGNOSIS — N1831 Chronic kidney disease, stage 3a: Secondary | ICD-10-CM | POA: Diagnosis present

## 2021-03-20 DIAGNOSIS — E8809 Other disorders of plasma-protein metabolism, not elsewhere classified: Secondary | ICD-10-CM | POA: Diagnosis present

## 2021-03-20 DIAGNOSIS — R404 Transient alteration of awareness: Secondary | ICD-10-CM | POA: Diagnosis not present

## 2021-03-20 DIAGNOSIS — G2401 Drug induced subacute dyskinesia: Secondary | ICD-10-CM | POA: Diagnosis present

## 2021-03-20 DIAGNOSIS — Z7984 Long term (current) use of oral hypoglycemic drugs: Secondary | ICD-10-CM

## 2021-03-20 DIAGNOSIS — R569 Unspecified convulsions: Principal | ICD-10-CM

## 2021-03-20 DIAGNOSIS — I10 Essential (primary) hypertension: Secondary | ICD-10-CM | POA: Diagnosis not present

## 2021-03-20 DIAGNOSIS — R739 Hyperglycemia, unspecified: Secondary | ICD-10-CM | POA: Diagnosis not present

## 2021-03-20 DIAGNOSIS — G4089 Other seizures: Secondary | ICD-10-CM | POA: Diagnosis not present

## 2021-03-20 DIAGNOSIS — Z79899 Other long term (current) drug therapy: Secondary | ICD-10-CM

## 2021-03-20 DIAGNOSIS — G4700 Insomnia, unspecified: Secondary | ICD-10-CM | POA: Diagnosis present

## 2021-03-20 DIAGNOSIS — U071 COVID-19: Secondary | ICD-10-CM | POA: Diagnosis present

## 2021-03-20 DIAGNOSIS — N179 Acute kidney failure, unspecified: Secondary | ICD-10-CM | POA: Diagnosis present

## 2021-03-20 DIAGNOSIS — Z818 Family history of other mental and behavioral disorders: Secondary | ICD-10-CM

## 2021-03-20 DIAGNOSIS — R000 Tachycardia, unspecified: Secondary | ICD-10-CM | POA: Diagnosis not present

## 2021-03-20 DIAGNOSIS — E1122 Type 2 diabetes mellitus with diabetic chronic kidney disease: Secondary | ICD-10-CM | POA: Diagnosis present

## 2021-03-20 DIAGNOSIS — K219 Gastro-esophageal reflux disease without esophagitis: Secondary | ICD-10-CM | POA: Diagnosis present

## 2021-03-20 DIAGNOSIS — Z85528 Personal history of other malignant neoplasm of kidney: Secondary | ICD-10-CM

## 2021-03-20 DIAGNOSIS — G319 Degenerative disease of nervous system, unspecified: Secondary | ICD-10-CM | POA: Diagnosis not present

## 2021-03-20 DIAGNOSIS — I9589 Other hypotension: Secondary | ICD-10-CM | POA: Diagnosis not present

## 2021-03-20 LAB — URINALYSIS, ROUTINE W REFLEX MICROSCOPIC
Bacteria, UA: NONE SEEN
Bilirubin Urine: NEGATIVE
Glucose, UA: 50 mg/dL — AB
Hgb urine dipstick: NEGATIVE
Ketones, ur: NEGATIVE mg/dL
Nitrite: NEGATIVE
Protein, ur: NEGATIVE mg/dL
Specific Gravity, Urine: 1.006 (ref 1.005–1.030)
pH: 6 (ref 5.0–8.0)

## 2021-03-20 LAB — RAPID URINE DRUG SCREEN, HOSP PERFORMED
Amphetamines: NOT DETECTED
Barbiturates: NOT DETECTED
Benzodiazepines: POSITIVE — AB
Cocaine: NOT DETECTED
Opiates: NOT DETECTED
Tetrahydrocannabinol: NOT DETECTED

## 2021-03-20 LAB — CBC WITH DIFFERENTIAL/PLATELET
Abs Immature Granulocytes: 0.03 10*3/uL (ref 0.00–0.07)
Basophils Absolute: 0 10*3/uL (ref 0.0–0.1)
Basophils Relative: 0 %
Eosinophils Absolute: 0.1 10*3/uL (ref 0.0–0.5)
Eosinophils Relative: 2 %
HCT: 37.2 % (ref 36.0–46.0)
Hemoglobin: 12.1 g/dL (ref 12.0–15.0)
Immature Granulocytes: 0 %
Lymphocytes Relative: 21 %
Lymphs Abs: 1.5 10*3/uL (ref 0.7–4.0)
MCH: 30.3 pg (ref 26.0–34.0)
MCHC: 32.5 g/dL (ref 30.0–36.0)
MCV: 93.2 fL (ref 80.0–100.0)
Monocytes Absolute: 0.7 10*3/uL (ref 0.1–1.0)
Monocytes Relative: 10 %
Neutro Abs: 4.7 10*3/uL (ref 1.7–7.7)
Neutrophils Relative %: 67 %
Platelets: 162 10*3/uL (ref 150–400)
RBC: 3.99 MIL/uL (ref 3.87–5.11)
RDW: 13.3 % (ref 11.5–15.5)
WBC: 7.1 10*3/uL (ref 4.0–10.5)
nRBC: 0 % (ref 0.0–0.2)

## 2021-03-20 LAB — COMPREHENSIVE METABOLIC PANEL
ALT: 14 U/L (ref 0–44)
AST: 21 U/L (ref 15–41)
Albumin: 3 g/dL — ABNORMAL LOW (ref 3.5–5.0)
Alkaline Phosphatase: 79 U/L (ref 38–126)
Anion gap: 13 (ref 5–15)
BUN: 19 mg/dL (ref 6–20)
CO2: 17 mmol/L — ABNORMAL LOW (ref 22–32)
Calcium: 8.6 mg/dL — ABNORMAL LOW (ref 8.9–10.3)
Chloride: 109 mmol/L (ref 98–111)
Creatinine, Ser: 1.96 mg/dL — ABNORMAL HIGH (ref 0.44–1.00)
GFR, Estimated: 31 mL/min — ABNORMAL LOW (ref >60)
Glucose, Bld: 264 mg/dL — ABNORMAL HIGH (ref 70–99)
Potassium: 5.2 mmol/L — ABNORMAL HIGH (ref 3.5–5.1)
Sodium: 139 mmol/L (ref 135–145)
Total Bilirubin: 0.5 mg/dL (ref 0.3–1.2)
Total Protein: 6.2 g/dL — ABNORMAL LOW (ref 6.5–8.1)

## 2021-03-20 LAB — CBG MONITORING, ED: Glucose-Capillary: 246 mg/dL — ABNORMAL HIGH (ref 70–99)

## 2021-03-20 LAB — I-STAT BETA HCG BLOOD, ED (MC, WL, AP ONLY): I-stat hCG, quantitative: <5 m[IU]/mL (ref <5)

## 2021-03-20 LAB — VALPROIC ACID LEVEL: Valproic Acid Lvl: 72 ug/mL (ref 50.0–100.0)

## 2021-03-20 LAB — AMMONIA: Ammonia: 29 umol/L (ref 9–35)

## 2021-03-20 MED ORDER — GADOBUTROL 1 MMOL/ML IV SOLN
6.0000 mL | Freq: Once | INTRAVENOUS | Status: AC | PRN
  Administered 2021-03-20: 6 mL via INTRAVENOUS

## 2021-03-20 MED ORDER — LEVETIRACETAM IN NACL 1000 MG/100ML IV SOLN
1000.0000 mg | Freq: Once | INTRAVENOUS | Status: AC
  Administered 2021-03-20: 1000 mg via INTRAVENOUS
  Filled 2021-03-20: qty 100

## 2021-03-20 NOTE — ED Notes (Signed)
Pt found standing in room, unhooked from monitor and trying to go to bathroom. Pt redirected back to bed and placed back on monitor and purewick and educated on how to use it. RN notified.

## 2021-03-20 NOTE — Consult Note (Signed)
NEUROLOGY CONSULTATION NOTE   Date of service: March 20, 2021 Patient Name: [name] MRN:  [id number] DOB:  1976/01/07 Reason for consult: "Seizures, on depakote" Requesting Provider: Godfrey Pick, MD _ _ _   _ __   _ __ _ _  __ __   _ __   __ _  History of Present Illness   [name] is a 45 y.o. female with a past medical history significant for intellectual delay and schizo affective disorder as well as renal cell carcinoma, diabetes, obesity, anemia and supraventricular tachycardia with short PR interval who had seizure at her day care.  I was unable to speak to her Caretaker at the day care but was able to speak with her sister who was notified of the event and obtain information from chart review.   Per notes, patient has no known history of seizures, sister confirms that [name] has never had any seizures.  She has baseline drug-induced dyskinesias secondary to her psychiatric medications that tend to be symmetric and involves primarily her hands.  However today she had markedly different rhythmic movement of the right arm and leg and was drooling from the right side of her face at her day center, and appeared to be unconscious at the time.  She has never had these movements and does not have a known seizure history per reports to the ED provider.  EMS was therefore activated and on their arrival she had further rhythmic right upper extremity twitching for which she received 5 mg of Versed.  In the ED providers initial evaluation she had additional twitching of the right arm for which she was given Keppra 1000 mg at 12:43 PM.  She was initially fairly somnolent with the medications she had been given, unclear whether there is also component of postictal state given her baseline and the sedating effects of the medication.  No clear focality to her examination for the ED provider. She gradually improved and is now back to her baseline although there are no caregivers at bedside to fully  confirm this.  [name] has not had any seizures in the past, no hx of febrile seizures as a kid, no history of CNS infection, CNS malignany or CNS surgery, no recent head injuries. No recent illness per sister. Was at her baseline when she left for the daycare in there morning.  She is on depakote and was recently started on Ingrezza.   ROS   Constitutional Denies weight loss, fever and chills.   HEENT Denies changes in vision and hearing.   Respiratory Denies SOB and cough.   CV Denies palpitations and CP   GI Denies abdominal pain, nausea, vomiting and diarrhea.   GU Denies dysuria and urinary frequency.   MSK Denies myalgia and joint pain.   Skin Denies rash and pruritus.   Neurological Denies headache and syncope.   Psychiatric Denies recent changes in mood. Denies anxiety and depression.    Past History   Past Medical History:  Diagnosis Date   Acne    Anemia    Cognitive impairment    Diabetes mellitus without complication ([place])    Internal hemorrhoid, bleeding 10/22/2010   Obesity    Schizoaffective disorder    SVT (supraventricular tachycardia) ([place])    with short PR interval - followed by Dr. Lovena Le   Past Surgical History:  Procedure Laterality Date   BREAST REDUCTION SURGERY  08/10/2005   HYSTEROSCOPY  09/09/2004   w/ removal of polyps   KIDNEY SURGERY  Left 05/17/2020   Tumor removal by Dr. Dema Severin   REDUCTION MAMMAPLASTY Bilateral 2007   SPINE SURGERY N/A 04/18/2020   Dr. Arnoldo Morale - pinched nerve of Lumbar Spine   Family History  Problem Relation Age of Onset   Schizophrenia Sister    Social History   Socioeconomic History   Marital status: Single    Spouse name: Not on file   Number of children: Not on file   Years of education: 10 years   Highest education level: 10th grade  Occupational History   Occupation: Disabled  Tobacco Use   Smoking status: Never   Smokeless tobacco: Never  Vaping Use   Vaping Use: Never used  Substance and Sexual  Activity   Alcohol use: No   Drug use: No   Sexual activity: Never    Birth control/protection: Injection  Other Topics Concern   Not on file  Social History Narrative   Caregiver is Tax adviser. Lives in [facility] in [place], [phone]. Previously lived in group home. Twin sister with similar problem list. Has 24H care. Case manager is Justin Mend, [phone].      Patient recently started going to [employer]. Phone: [phone]. [place] [address] [place] [address]      Caregiver works for [employer], [address] [place], [place] [address].       Guardian is Delaney Meigs of [employer] [phone] office. After hours service [phone]   Social Determinants of Health   Financial Resource Strain: Low Risk    Difficulty of Paying Living Expenses: Not hard at all  Food Insecurity: No Food Insecurity   Worried About Charity fundraiser in the Last Year: Never true   Arboriculturist in the Last Year: Never true  Transportation Needs: No Transportation Needs   Lack of Transportation (Medical): No   Lack of Transportation (Non-Medical): No  Physical Activity: Insufficiently Active   Days of Exercise per Week: 5 days   Minutes of Exercise per Session: 10 min  Stress: Stress Concern Present   Feeling of Stress : To some extent  Social Connections: Socially Isolated   Frequency of Communication with Friends and Family: Once a week   Frequency of Social Gatherings with Friends and Family: Once a week   Attends Religious Services: 1 to 4 times per year   Active Member of [employer] or Organizations: No   Attends Archivist Meetings: Never   Marital Status: Never married   Allergies  Allergen Reactions   Minocycline     Unknown     Medications  (Not in a hospital admission)    Vitals   Vitals:   03/20/21 1855 03/20/21 2133 03/20/21 2136 03/20/21 2145  BP: 112/87 116/82  121/82  Pulse: 98 (!) 102 100 (!) 101   Resp: 13     Temp:      TempSrc:      SpO2: 100% 97% 98% 100%     There is no height or weight on file to calculate BMI.  Physical Exam   General: Laying comfortably in bed; in no acute distress.  HENT: Normal oropharynx and mucosa. Normal external appearance of ears and nose.  Neck: Supple, no pain or tenderness  CV: No JVD. No peripheral edema.  Pulmonary: Symmetric Chest rise. Normal respiratory effort.  Abdomen: Soft to touch, non-tender.  Ext: No cyanosis, edema, or deformity  Skin: No rash. Normal palpation of skin.  Musculoskeletal: Normal digits and nails by inspection. No clubbing.   Neurologic Examination  Mental status/Cognition: Alert, oriented to self, place, month but not year, poor attention, easily distracted.  Speech/language: Fluent, comprehension intact, object naming intact, repetition intact. Cranial nerves:   CN II Pupils equal and reactive to light, no VF deficits   CN III,IV,VI EOM intact, no gaze preference or deviation, no nystagmus    CN V normal sensation in V1, V2, and V3 segments bilaterally    CN VII no asymmetry, no nasolabial fold flattening    CN VIII normal hearing to speech    CN IX & X normal palatal elevation, no uvular deviation    CN XI 5/5 head turn and 5/5 shoulder shrug bilaterally    CN XII midline tongue protrusion    Motor:  Muscle bulk: normal, tone mildly increased, pronator drift none. Has mild asterixis on exam along with enhanced physiological tremor. Mvmt Root Nerve  Muscle Right Left Comments  SA C5/6 Ax Deltoid 5 5   EF C5/6 Mc Biceps 5 5   EE C6/7/8 Rad Triceps 5 5   WF C6/7 Med FCR     WE C7/8 PIN ECU     F Ab C8/T1 U ADM/FDI 5 5   HF L1/2/3 Fem Illopsoas 5 5   KE L2/3/4 Fem Quad 5 5   DF L4/5 D Peron Tib Ant 5 5   PF S1/2 Tibial Grc/Sol 5 5    Reflexes:  Right Left Comments  Pectoralis      Biceps (C5/6) 2 2   Brachioradialis (C5/6) 2 2    Triceps (C6/7) 2 2    Patellar (L3/4) 2 2    Achilles (S1)       Hoffman      Plantar     Jaw jerk    Sensation:  Light touch Intact throughout.   Pin prick    Temperature    Vibration   Proprioception    Coordination/Complex Motor:  - Finger to Nose intact BL - Heel to shin intact BL - Rapid alternating movement intact BL - Gait: Stride length normal. Arm swing poor. Base width narrow  Labs   CBC:  Recent Labs  Lab 03/20/21 1233  WBC 7.1  NEUTROABS 4.7  HGB 12.1  HCT 37.2  MCV 93.2  PLT 245    Basic Metabolic Panel:  Lab Results  Component Value Date   NA 139 03/20/2021   K 5.2 (H) 03/20/2021   CO2 17 (L) 03/20/2021   GLUCOSE 264 (H) 03/20/2021   BUN 19 03/20/2021   CREATININE 1.96 (H) 03/20/2021   CALCIUM 8.6 (L) 03/20/2021   GFRNONAA 31 (L) 03/20/2021   GFRAA 47 (L) 02/28/2020   Lipid Panel:  Lab Results  Component Value Date   LDLCALC 40 05/16/2020   HgbA1c:  Lab Results  Component Value Date   HGBA1C 7.1 (A) 02/26/2021   Urine Drug Screen:     Component Value Date/Time   LABOPIA NONE DETECTED 03/20/2021 1514   COCAINSCRNUR NONE DETECTED 03/20/2021 1514   LABBENZ POSITIVE (A) 03/20/2021 1514   AMPHETMU NONE DETECTED 03/20/2021 1514   THCU NONE DETECTED 03/20/2021 1514   LABBARB NONE DETECTED 03/20/2021 1514    Alcohol Level No results found for: [facility]  MRI Brain with and without contrast(personally reviewed): No mass, no lesions, no acute abnormalities.  rEEG:  pending  Impression   [name] is a 45 y.o. female with PMH significant for with a past medical  history significant for intellectual delay and schizo affective disorder as well as renal cell carcinoma, diabetes, obesity, anemia and supraventricular tachycardia with short PR interval who presents with R sided shaking with altered consciousness concerning for seizures x 2. Was given Versed by EMS and post ictal/sedated from [place] on arrival. In the ED providers initial evaluation she had additional twitching of the right arm for which she was  given Keppra 1000 mg. She gradually returned to her baseline.  Workup with MRI Brain with and without contrast is negative for any mass or lesions, no acute intracranial abnormality. Routine EEG is still pending.  I do think that the event seems more consistent with a focal seizure with impaired awareness, tardive dyskinesias or tremors in general do not cause AMS. The fact that she had the seizure while she is on adequate dosing of Depakote with therapeutic depakote levels, I would favor either increasing her Depakote or perhaps adding Topiramate to her regimen. I am somewhat hesitant on increasing her Depakote rightaway specially since she has mild asterixis/negative myoclonus on exam which can be caused by depakote, specially if her ammonis is elevated. If her Ammonia is elevated, I would consider adding Topiramate specially since it would help with tremors.  Recommendations  - Observe overnight due to more than one seizure/prolonged seizure, specially to monitor for seizure clustering. - Ammonia level ordered and pending. If her Ammonia is elevated, I would favor adding Topiramate instead of increasing Depakote. - Seizure precautions. - Ativan 53m for seizure lasting more than 3 mins. - routine EEG is pending. - Follow up with Neurology outpatient. ______________________________________________________________________  Plan discussed with Dr. DDoren Custard  Thank you for the opportunity to take part in the care of this patient. If you have any further questions, please contact the neurology consultation attending.  Signed,  SStouchsburgPager Number [phone]_ _ _   _ __   _ __ _ _  __ __   _ __   __ _

## 2021-03-20 NOTE — ED Provider Notes (Signed)
[facility] EMERGENCY DEPARTMENT Provider Note   CSN: [id number] Arrival date & time: 03/20/21  1213     History Chief Complaint  Patient presents with   Seizures    Josanne Boerema is a 45 y.o. female with a past medical history of diabetes,, schizoaffective disorder, cognitive impairment, papillary renal cell carcinoma.  Patient lives in a group home and is attended by the manager of her daycare facility as well as her legal guardian and caretaker, [name].  History is given by these 2 women.  Later of her day facility states that she happened to be walking by the room and patient was leaned over in her teacher's lap jerking on her right side only.  She states that it continued and never fully resolved.  Patient was unconscious during this event.  Patient was brought in by EMS who gave the patient Versed prior to arrival.  She has no previous history of seizure disorder and has not been complaining of headaches.  She does have a recent change in medication and has started on Ingrezza for history of tardive dyskinesia.  Patient has not recently been sick.   Seizures     Past Medical History:  Diagnosis Date   Acne    Anemia    Cognitive impairment    Diabetes mellitus without complication ([place])    Internal hemorrhoid, bleeding 10/22/2010   Obesity    Schizoaffective disorder    SVT (supraventricular tachycardia) ([place])    with short PR interval - followed by Dr. 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   Renal mass 03/08/2020   Hip pain 01/15/2020   Diabetic neuropathy, type II diabetes mellitus ([place]) 05/27/2019   Skin abscess 06/29/2018   GERD (gastroesophageal reflux disease) 06/29/2018   Contraception management 10/11/2017   SVT (supraventricular tachycardia) ([place]) 01/26/2015   Hypotension 12/20/2014   External  thrombosed hemorrhoids 12/31/2011   Neck strain 12/31/2011   Acne 11/17/2011   Incontinence of urine 08/19/2010   Type 2 diabetes mellitus with renal manifestations ([place]) 03/03/2010   CHRONIC KIDNEY DISEASE STAGE III (MODERATE) 03/03/2010   TACHYCARDIA, HX OF 04/13/2008   CONSTIPATION, CHRONIC 04/12/2007   Schizophrenia ([place]) 07/09/2006   MENTAL RETARDATION 07/09/2006    Past Surgical History:  Procedure Laterality Date   BREAST REDUCTION SURGERY  08/10/2005   HYSTEROSCOPY  09/09/2004   w/ removal of polyps   KIDNEY SURGERY Left 05/17/2020   Tumor removal by Dr. Dema Severin   REDUCTION MAMMAPLASTY Bilateral 2007   SPINE SURGERY N/A 04/18/2020   Dr. Arnoldo Morale - pinched nerve of Lumbar Spine     OB History   No obstetric history on file.     Family History  Problem Relation Age of Onset   Schizophrenia Sister     Social History   Tobacco Use   Smoking status: Never   Smokeless tobacco: Never  Vaping Use   Vaping Use: Never used  Substance Use Topics   Alcohol use: No   Drug use: No    Home Medications Prior to Admission medications   Medication Sig Start Date End Date Taking? Authorizing Provider  atorvastatin (LIPITOR) 20 MG tablet TAKE ONE TABLET EACH DAY 07/29/20   Leeanne Rio, MD  benztropine (COGENTIN) 1 MG tablet Take 1 mg by mouth daily.    [provider]  Blood Glucose  Monitoring Suppl [facility] VERIO) w/Device KIT Check blood sugar once daily 03/27/20   Leeanne Rio, MD  clozapine [facility]) 100 MG disintegrating tablet Take 400 mg by mouth at bedtime.     [provider]  divalproex (DEPAKOTE) 500 MG 24 hr tablet Take 1,000 mg by mouth daily.    [provider]  docusate sodium (COLACE) 100 MG capsule TAKE ONE CAPSULE EACH DAY 10/03/20   Leeanne Rio, MD  Emollient (CETAPHIL) cream APPLY DAILY 07/13/19   Leeanne Rio, MD  famotidine (PEPCID) 20 MG tablet TAKE ONE TABLET TWICE DAILY 08/26/20    Leeanne Rio, MD  GAS-X EXTRA STRENGTH 125 MG chewable tablet TAKE ONE TABLET DAILY AS NEEDED FOR GAS 06/12/15   Leeanne Rio, MD  glipiZIDE (GLUCOTROL) 10 MG tablet TAKE ONE TABLET TWICE DAILY BEFORE MEALS 12/06/20   Leeanne Rio, MD  glucose blood [facility] VERIO) test strip Check blood sugar once daily 03/27/20   Leeanne Rio, MD  GOODSENSE ASPIRIN 81 MG chewable tablet CHEW 1 TABLET DAILY 03/26/20   Leeanne Rio, MD  haloperidol (HALDOL) 5 MG tablet Take 5 mg by mouth.    [provider]  Incontinence Supply Disposable (INCONTINENCE BRIEF MEDIUM) MISC Wear every night 05/27/19   Leeanne Rio, MD  Lancet Devices (ONE TOUCH DELICA LANCING DEV) MISC Check blood sugar once daily 03/27/20   Leeanne Rio, MD  lisinopril (ZESTRIL) 2.5 MG tablet TAKE ONE TABLET AT BEDTIME 10/11/20   Leeanne Rio, MD  medroxyPROGESTERone (DEPO-PROVERA) 150 MG/ML injection Inject 150 mg into the muscle every 3 (three) months.    [provider]  metoprolol succinate (TOPROL-XL) 25 MG 24 hr tablet TAKE ONE TABLET DAILY 06/08/20   Leeanne Rio, MD  OneTouch Delica Lancets 76P MISC Check blood sugar once daily 03/27/20   Leeanne Rio, MD  PARoxetine (PAXIL) 10 MG tablet Take 10 mg by mouth daily.    [provider]  polyethylene glycol powder (GLYCOLAX/MIRALAX) 17 GM/SCOOP powder TAKE 17G AT BEDTIME -CAN GO UPTO 3 TIMESDAILY IF NEEDED 11/29/20   Leeanne Rio, MD  QC PAIN RELIEF EXTRA STRENGTH 500 MG tablet TAKE ONE TABLET EVERY EIGHT HOURS AS NEEDED FOR MILD PAIN OR FEVER 01/02/20   Leeanne Rio, MD  QUEtiapine (SEROQUEL) 25 MG tablet Take 25 mg by mouth at bedtime. 12/11/20   [provider]  sitaGLIPtin (JANUVIA) 50 MG tablet TAKE ONE TABLET EACH DAY 01/29/21   Leeanne Rio, MD  zolpidem (AMBIEN) 5 MG tablet Take 5 mg by mouth at bedtime as needed. 12/11/20   [provider]     Allergies    Minocycline  Review of Systems   Review of Systems  Unable to perform ROS: Other  Neurological:  Positive for seizures.  Patient MR Physical Exam Updated Vital Signs BP 104/83 (BP Location: Left Arm)   Pulse (!) 101   Temp 98.1 F (36.7 C) (Axillary)   Resp 20   SpO2 100%   Physical Exam Vitals and nursing note reviewed.  Constitutional:      General: She is not in acute distress.    Appearance: She is well-developed. She is not diaphoretic.  HENT:     Head: Normocephalic and atraumatic.     Right Ear: External ear normal.     Left Ear: External ear normal.     Nose: Nose normal.     Mouth/Throat:     Mouth: Mucous  membranes are moist.  Eyes:     General: No scleral icterus.    Conjunctiva/sclera: Conjunctivae normal.  Cardiovascular:     Rate and Rhythm: Normal rate and regular rhythm.     Heart sounds: Normal heart sounds. No murmur heard.   No friction rub. No gallop.  Pulmonary:     Effort: Pulmonary effort is normal. No respiratory distress.     Breath sounds: Normal breath sounds.  Abdominal:     General: Bowel sounds are normal. There is no distension.     Palpations: Abdomen is soft. There is no mass.     Tenderness: There is no abdominal tenderness. There is no guarding.  Musculoskeletal:     Cervical back: Normal range of motion.  Skin:    General: Skin is warm and dry.  Neurological:     Mental Status: She is lethargic.     GCS: GCS eye subscore is 3. GCS verbal subscore is 4. GCS motor subscore is 5.     Comments: Intermittent twitching in the R arm  Psychiatric:        Behavior: Behavior normal.    ED Results / Procedures / Treatments   Labs (all labs ordered are listed, but only abnormal results are displayed) Labs Reviewed  CBG MONITORING, ED - Abnormal; Notable for the following components:      Result Value   Glucose-Capillary 246 (*)    All other components within normal limits  COMPREHENSIVE METABOLIC PANEL  CBC  WITH DIFFERENTIAL/PLATELET  URINALYSIS, ROUTINE W REFLEX MICROSCOPIC  RAPID URINE DRUG SCREEN, HOSP PERFORMED  I-STAT BETA HCG BLOOD, ED ([facility], [facility], [facility] ONLY)    EKG EKG Interpretation  Date/Time:  Wednesday March 20 2021 12:20:03 EST Ventricular Rate:  101 PR Interval:  124 QRS Duration: 80 QT Interval:  326 QTC Calculation: 423 R Axis:   1 Text Interpretation: Sinus tachycardia Low voltage, precordial leads Confirmed by Ronnald Nian, Adam ([id number]) on 03/20/2021 12:23:41 PM  Radiology No results found.  Procedures Procedures   Medications Ordered in ED Medications  levETIRAcetam (KEPPRA) IVPB 1000 mg/100 mL premix (1,000 mg Intravenous New Bag/Given 03/20/21 1243)    ED Course  I have reviewed the triage vital signs and the nursing notes.  Pertinent labs & imaging results that were available during my care of the patient were reviewed by me and considered in my medical decision making (see chart for details).    MDM Rules/Calculators/A&P                         Patient here with reported seizure-like activity or activity earlier today.The differential diagnosis for includes but is not limited to idiopathic seizure, traumatic brain injury, intracranial hemorrhage, vascular lesion, mass or space containing lesion, degenerative neurologic disease, congenital brain abnormality, infectious etiology such as meningitis, encephalitis or abscess, metabolic disturbance including hyper or hypoglycemia, hyper or hyponatremia, hyperosmolar state, uremia, hepatic failure, hypocalcemia, hypomagnesemia.  Toxic substances such as cocaine, lidocaine, antidepressants, theophylline, alcohol withdrawal, drug withdrawal, eclampsia, hypertensive encephalopathy and anoxic brain injury. I ordered and reviewed labs that included a CMP which showed mildly elevated creatinine and potassium this is consistent with patient's baseline renal insufficiency, CBC UA, pregnancy test all within normal limits.  UDS positive  for benzodiazepines given by EMS.  I ordered and reviewed CT head which shows no acute abnormalities, EKG shows sinus tachycardia at a rate of 106.  Case discussed with Dr. Curly [place].  Patient is more arousable and  does not have apparent focal abnormality at this time.  She does appear to have a focal seizure and this is new in onset.  I have ordered a bedside EEG and MRI brain given her history of renal carcinoma.  Signout given to PA [place]. Final Clinical Impression(s) / ED Diagnoses Final diagnoses:  None    Rx / DC Orders ED Discharge Orders     None        Margarita Mail, PA-C 03/22/21 [place], [place], DO 03/25/21 [phone]

## 2021-03-20 NOTE — H&P (Addendum)
[facility] Admission History and Physical Service Pager: [phone]  Patient name: [name] Medical record number: [id number] Date of birth: 01/04/1976 Age: 45 y.o. Gender: female  Primary Care Provider: Leeanne Rio, MD Consultants: Neuro Code Status: DNI, but chest compressions/meds/shocks ok Preferred Emergency Contact: "my provider" [name]  Chief Complaint: Seizure like activity  Assessment and Plan: [name] is a 45 y.o. female presenting with seizure like activity . PMH is significant for Schizophrenia, Cognitive Impairment, DM2, CKD III, papilary renal cell carcinoma.  Seizure Like Activity Patient arrives from group home with witnessed seizure. EMS reported she was still exhibiting signs of focal seizure when they saw her and they gave 5 mg versed IM. She was postictal afterwards. In the ED she was given 1000 mg of Keppra. Her labs were signficant for an elevated CBG of 246, a K of 5.2, with a Cr on 1.96 and a GFR on 31. UA was positive for 50 glucose and small leukocytes. UDS was positive for benzodiazepines (given in transit to ED), and she is COVID positive. On imaging, her CT head showed no acute intracranial pathology, her MR Brain W/WO contrast showed no acute intracranial process. No evidence of brain mass or lesion. On admission, her vitals where stable and she had no neurologic deficits on neuro exam. She was oriented to self and day, but not place. She was initially sleepy, but became appropriately responsive once she awoke. Etiology of her seizure like activity is still unknown at this time. It's unlikely to be related to hypoglycemia, given her CBG. It could be 2/2 medications she is taking, although she takes her medicine regularly, and has never had an issue in the past. It's unlikely she took a substance or new medicine that caused the seizure like activity, given her UDS. At this time,  neurology is following and has ordered an EEG.  -Admit to FPTS, Attending Dr. Erin Prince -NH4 level -EEG -Neuro checks q4 -Cardiac Monitoring x 24 hours -Consult to Neurology, appreciate recs -Regular diet - PT/OT eval and treat - awaiting med rec (pharm tech reports cannot call family overnight, plans to at 0700 - AM BMP @ 0500  COVID positive Completely asymptomatic. Incidentally found to be COVID positive on admission. Currently VSS with normal SpO2 on room air. No shortness of breath or difficulty breathing. Chart review does not reveal any known recent COVID illnesses that could contribute to her positive test on admission. - continue to monitor - O2 via [place] if needed to maintain SpO2 > 92%  Hypoalbuminemia Patient Albumin 3.0 on admission with Total protein slightly decreased to 6.2 -RD Consult  Schizophrenia w/ dyskinesias Patient with history of dyskinesias secondary to psychiatric medications, primarily symmetric and involving her hands.  -Continue home meds after med rec  DM2 Patient sugars were 240's on admission. Patient reports that she takes her meds regularly and has help managing them at her group home. 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 Patient GFR 31.6 today. Patient also has history of left partial nephrectomy for papillary renal cell carcinoma.  -Encourage PO fluids -Avoid nephrotoxic agents  FEN/GI: NPO sips with meds until alert Prophylaxis: Lovenox  Disposition: Med tele  History of Present Illness:  [name] is a 45 y.o. female presenting with right arm and leg movements with drooling from right  side of her face. Patient apperared to be unconscious at the time. Patient reports that she's here for seizures. She reports her arm and leg were shaking. She reports that she had an episode like this earlier in  the week and had told her caretaker, but she didn't believe her. Patient asleep and hard to wake up, history limited by patient difficulty arousing, and slowed mentation. Patient may be at her baseline.    Review Of Systems: Per HPI with the following additions:   Review of Systems  Constitutional:  Negative for fever.  Eyes:  Negative for photophobia and visual disturbance.  Neurological:  Positive for tremors and seizures. Negative for headaches.  Psychiatric/Behavioral:  Negative for behavioral problems, confusion and hallucinations.     Patient Active Problem List   Diagnosis Date Noted   Papillary renal cell carcinoma ([place]) 06/08/2020   Microalbuminuria 03/27/2020   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   Renal mass 03/08/2020   Hip pain 01/15/2020   Diabetic neuropathy, type II diabetes mellitus ([facility]) 05/27/2019   Skin abscess 06/29/2018   GERD (gastroesophageal reflux disease) 06/29/2018   Contraception management 10/11/2017   SVT (supraventricular tachycardia) ([place]) 01/26/2015   Hypotension 12/20/2014   External thrombosed hemorrhoids 12/31/2011   Neck strain 12/31/2011   Acne 11/17/2011   Incontinence of urine 08/19/2010   Type 2 diabetes mellitus with renal manifestations ([place]) 03/03/2010   CHRONIC KIDNEY DISEASE STAGE III (MODERATE) 03/03/2010   TACHYCARDIA, HX OF 04/13/2008   CONSTIPATION, CHRONIC 04/12/2007   Schizophrenia ([place]) 07/09/2006   MENTAL RETARDATION 07/09/2006    Past Medical History: Past Medical History:  Diagnosis Date   Acne    Anemia    Cognitive impairment    Diabetes mellitus without complication ([place])    Internal hemorrhoid, bleeding 10/22/2010   Obesity    Schizoaffective disorder    SVT (supraventricular tachycardia) ([facility])    with short PR interval - followed by Dr. Lovena Le    Past Surgical History: Past Surgical History:  Procedure Laterality Date   BREAST REDUCTION SURGERY  08/10/2005   HYSTEROSCOPY  09/09/2004   w/ removal of  polyps   KIDNEY SURGERY Left 05/17/2020   Tumor removal by Dr. Dema Severin   REDUCTION MAMMAPLASTY Bilateral 2007   SPINE SURGERY N/A 04/18/2020   Dr. Arnoldo Morale - pinched nerve of Lumbar Spine    Social History: Social History   Tobacco Use   Smoking status: Never   Smokeless tobacco: Never  Vaping Use   Vaping Use: Never used  Substance Use Topics   Alcohol use: No   Drug use: No   Additional social history: None  Please also refer to relevant sections of EMR.  Family History: Family History  Problem Relation Age of Onset   Schizophrenia Sister     Allergies and Medications: Allergies  Allergen Reactions   Minocycline     Unknown    No current facility-administered medications on file prior to encounter.   Current Outpatient Medications on File Prior to Encounter  Medication Sig Dispense Refill   atorvastatin (LIPITOR) 20 MG tablet TAKE ONE TABLET EACH DAY 90 tablet 3   benztropine (COGENTIN) 1 MG tablet Take 1 mg by mouth daily.     Blood Glucose Monitoring Suppl (ONETOUCH VERIO) w/Device KIT Check blood sugar once daily 1 kit 0   clozapine (FAZACLO) 100 MG disintegrating tablet Take 400 mg by mouth at bedtime.      divalproex (DEPAKOTE) 500 MG 24 hr tablet Take 1,000 mg by  mouth daily.     docusate sodium (COLACE) 100 MG capsule TAKE ONE CAPSULE EACH DAY 90 capsule 1   Emollient (CETAPHIL) cream APPLY DAILY 236 g 3   famotidine (PEPCID) 20 MG tablet TAKE ONE TABLET TWICE DAILY 180 tablet 3   GAS-X EXTRA STRENGTH 125 MG chewable tablet TAKE ONE TABLET DAILY AS NEEDED FOR GAS 30 tablet 3   glipiZIDE (GLUCOTROL) 10 MG tablet TAKE ONE TABLET TWICE DAILY BEFORE MEALS 180 tablet 1   glucose blood (ONETOUCH VERIO) test strip Check blood sugar once daily 100 strip 3   GOODSENSE ASPIRIN 81 MG chewable tablet CHEW 1 TABLET DAILY 90 tablet 3   haloperidol (HALDOL) 5 MG tablet Take 5 mg by mouth.     Incontinence Supply Disposable (INCONTINENCE BRIEF MEDIUM) MISC Wear every  night 60 each 5   Lancet Devices (ONE TOUCH DELICA LANCING DEV) MISC Check blood sugar once daily 1 each 0   lisinopril (ZESTRIL) 2.5 MG tablet TAKE ONE TABLET AT BEDTIME 90 tablet 3   medroxyPROGESTERone (DEPO-PROVERA) 150 MG/ML injection Inject 150 mg into the muscle every 3 (three) months.     metoprolol succinate (TOPROL-XL) 25 MG 24 hr tablet TAKE ONE TABLET DAILY 90 tablet 3   OneTouch Delica Lancets 16P MISC Check blood sugar once daily 100 each 3   PARoxetine (PAXIL) 10 MG tablet Take 10 mg by mouth daily.     polyethylene glycol powder (GLYCOLAX/MIRALAX) 17 GM/SCOOP powder TAKE 17G AT BEDTIME -CAN GO UPTO 3 TIMESDAILY IF NEEDED 510 g 3   QC PAIN RELIEF EXTRA STRENGTH 500 MG tablet TAKE ONE TABLET EVERY EIGHT HOURS AS NEEDED FOR MILD PAIN OR FEVER 30 tablet 3   QUEtiapine (SEROQUEL) 25 MG tablet Take 25 mg by mouth at bedtime.     sitaGLIPtin (JANUVIA) 50 MG tablet TAKE ONE TABLET EACH DAY 90 tablet 1   zolpidem (AMBIEN) 5 MG tablet Take 5 mg by mouth at bedtime as needed.      Objective: BP 121/82   Pulse (!) 101   Temp 98.1 F (36.7 C) (Axillary)   Resp 13   SpO2 100%  Exam: General: Well appearing, sleepy but wakes to voice, no acute distress, African American woman Eyes: PEERL, Clear conjunctiva ENTM: Moist mucous membranes Neck: Soft, ROM grossly intact Cardiovascular: RRR, NRMG Respiratory: CTABL Gastrointestinal: Soft, NTTP, Non-distended MSK: Moving all extremities independently, bilateral lower arm/hand shaking Neuro: Strength and sensation intact in bilateral UE and LE. Patient alert and oriented to self and date. Psych: Good mood, appropriate affect, no signs of responding to internal stimuli  Labs and Imaging: CBC BMET  Recent Labs  Lab 03/20/21 1233  WBC 7.1  HGB 12.1  HCT 37.2  PLT 162   Recent Labs  Lab 03/20/21 1233  NA 139  K 5.2*  CL 109  CO2 17*  BUN 19  CREATININE 1.96*  GLUCOSE 264*  CALCIUM 8.6*     EKG: Sinus tachycardia QTc 423,  PR interval 124  CT Head: IMPRESSION: No acute intracranial pathology.  MR Brain W/WO contrast IMPRESSION: No acute intracranial process.  No evidence of brain mass or lesion.    Holley Bouche, MD 03/20/2021, 11:01 PM PGY-1, [place] Intern pager: [phone], text pages welcome  Upper Level Addendum: I have seen and evaluated this patient along with Dr. Marcina Millard and reviewed the above note, making necessary revisions as appropriate. These are denoted by green text. I agree with the medical decision making and  physical exam as noted above. Ezequiel Essex, MD PGY-2 [facility] Family Medicine Residency

## 2021-03-20 NOTE — Progress Notes (Signed)
Family medicine teaching service will be admitting this patient. Our pager information can be located in the physician sticky notes, treatment team sticky notes, and the headers of all our official daily progress notes.   FAMILY MEDICINE TEACHING SERVICE Patient - Please contact intern pager [phone] or text page via website [email or web address] (login: [email or web address]) for questions regarding care. DO NOT page listed attending provider unless there is no answer from the number above.   Ezequiel Essex, MD PGY-2, [place] Medicine Service pager [phone]

## 2021-03-20 NOTE — ED Provider Notes (Signed)
Signout from [employer] at shift change.   Pending MRI, EEG, and neuro reccs.   MRI performed. Pt rechecked and stable. She was given some soda to drink.   Pt seen by neuro, feels episodes consistent with seizure. Reccs admission for monitoring given two seizures, not returned to baseline between two seizures.  Also noted some asterixis on exam.  Will likely increase Depakote, but needs to have ammonia checked first.  Will provide medication recommendations based on findings.  Routine EEG plan.  Patient is a family practice patient.    Will request admission.  BP 121/82   Pulse (!) 101   Temp 98.1 F (36.7 C) (Axillary)   Resp 13   SpO2 100%   11:00 PM Spoke with family practice who will see.    [place] Cater, PA-C 03/20/21 2300    Godfrey Pick, MD 03/21/21 0100

## 2021-03-20 NOTE — ED Triage Notes (Signed)
Pt arrives via [facility] from adult daycare with witnessed seizure. EMS reports pt was still exhibiting signs of focal seizure on arrival. 5 mg versed IM administered, pt post ictal afterwards  EMS last VS - CBG 294, 110/70, HR 110, 99% on RA  #22 L hand by EMS

## 2021-03-20 NOTE — Progress Notes (Signed)
Pt unavailable for EEG at this time. Pt will be going for her STAT MRI first, Will attempt later when schedule permits

## 2021-03-21 ENCOUNTER — Other Ambulatory Visit: Payer: Self-pay

## 2021-03-21 DIAGNOSIS — R569 Unspecified convulsions: Secondary | ICD-10-CM

## 2021-03-21 LAB — BASIC METABOLIC PANEL
Anion gap: 9 (ref 5–15)
BUN: 15 mg/dL (ref 6–20)
CO2: 21 mmol/L — ABNORMAL LOW (ref 22–32)
Calcium: 8.8 mg/dL — ABNORMAL LOW (ref 8.9–10.3)
Chloride: 110 mmol/L (ref 98–111)
Creatinine, Ser: 1.79 mg/dL — ABNORMAL HIGH (ref 0.44–1.00)
GFR, Estimated: 35 mL/min — ABNORMAL LOW (ref >60)
Glucose, Bld: 207 mg/dL — ABNORMAL HIGH (ref 70–99)
Potassium: 4.4 mmol/L (ref 3.5–5.1)
Sodium: 140 mmol/L (ref 135–145)

## 2021-03-21 LAB — GLUCOSE, CAPILLARY
Glucose-Capillary: 189 mg/dL — ABNORMAL HIGH (ref 70–99)
Glucose-Capillary: 112 mg/dL — ABNORMAL HIGH (ref 70–99)
Glucose-Capillary: 138 mg/dL — ABNORMAL HIGH (ref 70–99)

## 2021-03-21 LAB — HEMOGLOBIN A1C
Hgb A1c MFr Bld: 8.1 % — ABNORMAL HIGH (ref 4.8–5.6)
Mean Plasma Glucose: 185.77 mg/dL

## 2021-03-21 LAB — RESP PANEL BY RT-PCR (FLU A&B, COVID) ARPGX2
Influenza A by PCR: NEGATIVE
Influenza B by PCR: NEGATIVE
SARS Coronavirus 2 by RT PCR: POSITIVE — AB

## 2021-03-21 LAB — MAGNESIUM: Magnesium: 2.3 mg/dL (ref 1.7–2.4)

## 2021-03-21 LAB — HIV ANTIBODY (ROUTINE TESTING W REFLEX): HIV Screen 4th Generation wRfx: NONREACTIVE

## 2021-03-21 MED ORDER — PAROXETINE HCL 10 MG PO TABS
10.0000 mg | ORAL_TABLET | Freq: Every day | ORAL | Status: DC
  Administered 2021-03-21 – 2021-03-25 (×5): 10 mg via ORAL
  Filled 2021-03-21 (×5): qty 1

## 2021-03-21 MED ORDER — POLYETHYLENE GLYCOL 3350 17 G PO PACK
17.0000 g | PACK | Freq: Every day | ORAL | Status: DC
  Administered 2021-03-23 – 2021-03-25 (×3): 17 g via ORAL
  Filled 2021-03-21 (×4): qty 1

## 2021-03-21 MED ORDER — ACETAMINOPHEN 325 MG PO TABS
650.0000 mg | ORAL_TABLET | Freq: Four times a day (QID) | ORAL | Status: DC | PRN
  Administered 2021-03-21 – 2021-03-23 (×2): 650 mg via ORAL
  Filled 2021-03-21 (×2): qty 2

## 2021-03-21 MED ORDER — ENOXAPARIN SODIUM 30 MG/0.3ML IJ SOSY
30.0000 mg | PREFILLED_SYRINGE | INTRAMUSCULAR | Status: DC
  Administered 2021-03-21: 30 mg via SUBCUTANEOUS
  Filled 2021-03-21: qty 0.3

## 2021-03-21 MED ORDER — LINAGLIPTIN 5 MG PO TABS
5.0000 mg | ORAL_TABLET | Freq: Every day | ORAL | Status: DC
  Filled 2021-03-21: qty 1

## 2021-03-21 MED ORDER — SIMETHICONE 80 MG PO CHEW
80.0000 mg | CHEWABLE_TABLET | Freq: Four times a day (QID) | ORAL | Status: DC | PRN
  Filled 2021-03-21: qty 1

## 2021-03-21 MED ORDER — ENOXAPARIN SODIUM 40 MG/0.4ML IJ SOSY
40.0000 mg | PREFILLED_SYRINGE | INTRAMUSCULAR | Status: DC
  Administered 2021-03-22 – 2021-03-25 (×4): 40 mg via SUBCUTANEOUS
  Filled 2021-03-21 (×4): qty 0.4

## 2021-03-21 MED ORDER — METOPROLOL SUCCINATE ER 25 MG PO TB24
25.0000 mg | ORAL_TABLET | Freq: Every day | ORAL | Status: DC
  Administered 2021-03-21 – 2021-03-24 (×4): 25 mg via ORAL
  Filled 2021-03-21 (×4): qty 1

## 2021-03-21 MED ORDER — GLIPIZIDE 5 MG PO TABS: 10.0000 mg | ORAL_TABLET | Freq: Two times a day (BID) | ORAL | Status: DC

## 2021-03-21 MED ORDER — QUETIAPINE FUMARATE 25 MG PO TABS
25.0000 mg | ORAL_TABLET | Freq: Every day | ORAL | Status: DC
  Administered 2021-03-21 – 2021-03-24 (×4): 25 mg via ORAL
  Filled 2021-03-21 (×4): qty 1

## 2021-03-21 MED ORDER — ZOLPIDEM TARTRATE 5 MG PO TABS: 5.0000 mg | ORAL_TABLET | Freq: Every evening | ORAL | Status: DC | PRN

## 2021-03-21 MED ORDER — INSULIN ASPART 100 UNIT/ML IJ SOLN
0.0000 [IU] | Freq: Three times a day (TID) | INTRAMUSCULAR | Status: DC
  Administered 2021-03-21: 1 [IU] via SUBCUTANEOUS
  Administered 2021-03-21 – 2021-03-22 (×2): 2 [IU] via SUBCUTANEOUS

## 2021-03-21 MED ORDER — HALOPERIDOL 5 MG PO TABS
5.0000 mg | ORAL_TABLET | Freq: Every day | ORAL | Status: DC
  Administered 2021-03-21 – 2021-03-24 (×4): 5 mg via ORAL
  Filled 2021-03-21 (×4): qty 1

## 2021-03-21 MED ORDER — DIVALPROEX SODIUM ER 500 MG PO TB24
1250.0000 mg | ORAL_TABLET | Freq: Every day | ORAL | Status: DC
  Administered 2021-03-21 – 2021-03-24 (×4): 1250 mg via ORAL
  Filled 2021-03-21 (×6): qty 1

## 2021-03-21 MED ORDER — ATORVASTATIN CALCIUM 10 MG PO TABS
20.0000 mg | ORAL_TABLET | Freq: Every day | ORAL | Status: DC
  Administered 2021-03-21 – 2021-03-24 (×4): 20 mg via ORAL
  Filled 2021-03-21 (×4): qty 2

## 2021-03-21 MED ORDER — POLYETHYLENE GLYCOL 3350 17 GM/SCOOP PO POWD
17.0000 g | Freq: Every day | ORAL | Status: DC
Start: 2021-03-21 — End: 2021-03-21
  Filled 2021-03-21: qty 255

## 2021-03-21 MED ORDER — FAMOTIDINE 20 MG PO TABS
20.0000 mg | ORAL_TABLET | Freq: Two times a day (BID) | ORAL | Status: DC
  Administered 2021-03-21 – 2021-03-25 (×8): 20 mg via ORAL
  Filled 2021-03-21 (×8): qty 1

## 2021-03-21 MED ORDER — LISINOPRIL 2.5 MG PO TABS
2.5000 mg | ORAL_TABLET | Freq: Every day | ORAL | Status: DC
  Administered 2021-03-21: 2.5 mg via ORAL
  Filled 2021-03-21: qty 1

## 2021-03-21 MED ORDER — VALBENAZINE TOSYLATE 40 MG PO CAPS
40.0000 mg | ORAL_CAPSULE | Freq: Every day | ORAL | Status: DC
  Administered 2021-03-21 – 2021-03-24 (×4): 40 mg via ORAL
  Filled 2021-03-21 (×5): qty 1

## 2021-03-21 MED ORDER — BENZTROPINE MESYLATE 1 MG PO TABS
1.0000 mg | ORAL_TABLET | Freq: Every day | ORAL | Status: DC
  Administered 2021-03-21 – 2021-03-25 (×5): 1 mg via ORAL
  Filled 2021-03-21 (×6): qty 1

## 2021-03-21 MED ORDER — VALPROIC ACID 250 MG PO CAPS
1500.0000 mg | ORAL_CAPSULE | Freq: Once | ORAL | Status: AC
  Administered 2021-03-21: 1500 mg via ORAL
  Filled 2021-03-21: qty 6

## 2021-03-21 MED ORDER — CLOZAPINE 100 MG PO TABS
400.0000 mg | ORAL_TABLET | Freq: Every day | ORAL | Status: DC
  Administered 2021-03-21 – 2021-03-24 (×4): 400 mg via ORAL
  Filled 2021-03-21 (×6): qty 4

## 2021-03-21 NOTE — ED Notes (Signed)
Per admitting team pt is allowed to eat

## 2021-03-21 NOTE — Discharge Instructions (Addendum)
Please check valproic acid TROUGH level, LFTs, CBC and ammonia by 03/29/21 given dose increase on 03/21/21.  Schedule follow-up appointment with primary care physician. Referral for outpatient neurology has been placed. Expect a call to schedule this appointment.  For COVID-19, CDC recommends quarantine for 5 days (completed in hospital) and wearing a mask for 5 days.   The following changes have been made to your medications: Depakote ER increased to 1250 mg at bedtime, Lisinopril 2.5 mg at bedtime HELD for low blood pressure (discuss this with primary care physician).  Continue all other medications as prescribed.   If you have worsening of symptoms, please call 911 or visit the nearest emergency department

## 2021-03-21 NOTE — ED Notes (Signed)
Prior to transporting this pt upstairs, pt had an episode of emesis in bed.

## 2021-03-21 NOTE — Procedures (Signed)
Patient Name: [name]  MRN: [id number]  Epilepsy Attending: Lora Havens  Referring Physician/Provider: Margarita Mail, PA-C Date: 03/20/2021 Duration: 23.04 mins  Patient history: 45 year old female with right-sided shaking and altered consciousness.  EEG to evaluate for seizure.  Level of alertness: Awake, drowsy  AEDs during EEG study: VPA  Technical aspects: This EEG study was done with scalp electrodes positioned according to the 10-20 International system of electrode placement. Electrical activity was acquired at a sampling rate of 500Hz  and reviewed with a high frequency filter of 70Hz  and a low frequency filter of 1Hz . EEG data were recorded continuously and digitally stored.   Description: The posterior dominant rhythm consists of 8-9 Hz activity of moderate voltage (25-35 uV) seen predominantly in posterior head regions, symmetric and reactive to eye opening and eye closing. Drowsiness was characterized by attenuation of the posterior background rhythm. EEG showed intermittent generalized 3 to 6 Hz theta-delta slowing. Hyperventilation and photic stimulation were not performed.     ABNORMALITY - Intermittent slow, generalized  IMPRESSION: This study is suggestive of mild diffuse encephalopathy, nonspecific etiology. No seizures or epileptiform discharges were seen throughout the recording.  [name]

## 2021-03-21 NOTE — Progress Notes (Deleted)
EEG complete - results pending 

## 2021-03-21 NOTE — Plan of Care (Signed)

## 2021-03-21 NOTE — Progress Notes (Signed)
   03/21/21 2019  Assess: MEWS Score  Temp (!) 100.5 F (38.1 C)  BP 95/62  Pulse Rate (!) 130  ECG Heart Rate (!) 130  Resp 20  Level of Consciousness Alert  SpO2 100 %  O2 Device Room Air  Assess: MEWS Score  MEWS Temp 1  MEWS Systolic 1  MEWS Pulse 3  MEWS RR 0  MEWS LOC 0  MEWS Score 5  MEWS Score Color Red  Assess: if the MEWS score is Yellow or Red  Were vital signs taken at a resting state? Yes  Focused Assessment No change from prior assessment  Early Detection of Sepsis Score *See Row Information* Medium  MEWS guidelines implemented *See Row Information* Yes  Treat  MEWS Interventions Administered prn meds/treatments  Pain Scale 0-10  Pain Score 0  Take Vital Signs  Increase Vital Sign Frequency  Red: Q 1hr X 4 then Q 4hr X 4, if remains red, continue Q 4hrs  Escalate  MEWS: Escalate Red: discuss with charge nurse/RN and provider, consider discussing with RRT  Notify: Charge Nurse/RN  Name of Charge Nurse/RN Notified Ken,RN  Date Charge Nurse/RN Notified 03/21/21  Time Charge Nurse/RN Notified 2027  Notify: Provider  Provider Name/Title Dr.Sowell  Date Provider Notified 03/21/21  Time Provider Notified 2026  Notification Type Call  Notification Reason Other (Comment) (red mews)  Provider response See new orders  Date of Provider Response 03/21/21  Time of Provider Response 2027  Document  Patient Outcome Stabilized after interventions  Progress note created (see row info) Yes

## 2021-03-21 NOTE — Progress Notes (Signed)
EEG complete - results pending 

## 2021-03-21 NOTE — Progress Notes (Signed)
Family Medicine Teaching Service Daily Progress Note Intern Pager: [phone]  Patient name: [name] Medical record number: [id number] Date of birth: 01-12-1976 Age: 45 y.o. Gender: female  Primary Care Provider: Leeanne Rio, MD Consultants: Neuro Code Status: DNR, but chest compressions/Meds/shock okay  Pt Overview and Major Events to Date:  11/9: Patient admitted to [place]. 11/10: EEG completed  Assessment and Plan: Patient is a 45 year old female presenting with seizure-like activity and COVID-positive.  PMH of schizophrenia, cognitive impairment, T2DM, CKD stage III, and papillary renal cell carcinoma.  Seizure-like activity EEG showed no epileptiform changes, but mild diffuse encephalopathy. -Per neuro, initated one time VPA 1500 mg, then tonight to restart Depakote DR 1250 mg.  We will arrange follow-up outpatient.  COVID positive Asx, afebrile, no leukocytosis, O2 sat 100% on room air. - Per group home, patient will need a repeat test before returning.  Test ordered.  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  T2DM CBG AM 189, dec from 246.  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 -Continue to encourage PO fluid intake - Continue lisinopril 2.5 mg nightly -Avoid nephrotoxic agents  GERD. - Continue home Pepcid 20 mg twice daily  Dyslipidemia - Continue home Lipitor 20 mg nightly   FEN/GI: Regular PPx: Lovenox Dispo: Group home pending negative repeat COVID test  Subjective:  Patient is a poor historian 2/2 intellectual delay, but does  not report pain.  She reports no dyspnea, cough, abdominal discomfort, N/V/D/C.  Objective: Temp:  [98.1 F (36.7 C)-99.4 F (37.4 C)] 99.4 F (37.4 C) (11/10 0441) Pulse Rate:  [90-109] 105 (11/10 0441) Resp:  [11-20] 16 (11/10 0441) BP: (102-124)/(66-96) 108/66 (11/10 0441) SpO2:  [97 %-100 %] 100 % (11/10 0441) Weight:  [65.8 kg] 65.8 kg (11/09 2326) Physical Exam: General: Age congruent appearing female, under the covers in no apparent distress Cardiovascular: RRR, no murmurs or abnormal heart sounds appreciated Respiratory: CTA x2 anteriorly. Patient would not cooperate with posterior exam. No cough; pulmonary effort normal on room air Abdomen: Soft, NTND Extremities: Warm, non-edematous BLE  Laboratory: Recent Labs  Lab 03/20/21 1233  WBC 7.1  HGB 12.1  HCT 37.2  PLT 162   Recent Labs  Lab 03/20/21 1233 03/21/21 0427  NA 139 140  K 5.2* 4.4  CL 109 110  CO2 17* 21*  BUN 19 15  CREATININE 1.96* 1.79*  CALCIUM 8.6* 8.8*  PROT 6.2*  --   BILITOT 0.5  --   ALKPHOS 79  --   ALT 14  --   AST 21  --   GLUCOSE 264* 207*    Resp Panel Positive for Coronavirus A1c: 8.1% Ammonia WNL VPA 72 Mg 2.3 HIV NR  Imaging/Diagnostic Tests: None new  Rosezetta Schlatter, MD 03/21/2021, 7:09 AM PGY-1, [place] Intern pager: [phone], text pages welcome

## 2021-03-22 DIAGNOSIS — R569 Unspecified convulsions: Secondary | ICD-10-CM | POA: Diagnosis not present

## 2021-03-22 LAB — BASIC METABOLIC PANEL
Anion gap: 7 (ref 5–15)
BUN: 21 mg/dL — ABNORMAL HIGH (ref 6–20)
CO2: 22 mmol/L (ref 22–32)
Calcium: 8.2 mg/dL — ABNORMAL LOW (ref 8.9–10.3)
Chloride: 112 mmol/L — ABNORMAL HIGH (ref 98–111)
Creatinine, Ser: 2.01 mg/dL — ABNORMAL HIGH (ref 0.44–1.00)
GFR, Estimated: 30 mL/min — ABNORMAL LOW (ref >60)
Glucose, Bld: 152 mg/dL — ABNORMAL HIGH (ref 70–99)
Potassium: 4.4 mmol/L (ref 3.5–5.1)
Sodium: 141 mmol/L (ref 135–145)

## 2021-03-22 LAB — CBC
HCT: 31.4 % — ABNORMAL LOW (ref 36.0–46.0)
Hemoglobin: 9.8 g/dL — ABNORMAL LOW (ref 12.0–15.0)
MCH: 29 pg (ref 26.0–34.0)
MCHC: 31.2 g/dL (ref 30.0–36.0)
MCV: 92.9 fL (ref 80.0–100.0)
Platelets: 139 10*3/uL — ABNORMAL LOW (ref 150–400)
RBC: 3.38 MIL/uL — ABNORMAL LOW (ref 3.87–5.11)
RDW: 13.6 % (ref 11.5–15.5)
WBC: 6.8 10*3/uL (ref 4.0–10.5)
nRBC: 0 % (ref 0.0–0.2)

## 2021-03-22 LAB — SARS CORONAVIRUS 2 (TAT 6-24 HRS): SARS Coronavirus 2: POSITIVE — AB

## 2021-03-22 LAB — GLUCOSE, CAPILLARY
Glucose-Capillary: 191 mg/dL — ABNORMAL HIGH (ref 70–99)
Glucose-Capillary: 163 mg/dL — ABNORMAL HIGH (ref 70–99)
Glucose-Capillary: 290 mg/dL — ABNORMAL HIGH (ref 70–99)

## 2021-03-22 MED ORDER — GLIPIZIDE 5 MG PO TABS
10.0000 mg | ORAL_TABLET | Freq: Two times a day (BID) | ORAL | Status: DC
  Administered 2021-03-22 – 2021-03-25 (×7): 10 mg via ORAL
  Filled 2021-03-22 (×7): qty 2

## 2021-03-22 MED ORDER — LINAGLIPTIN 5 MG PO TABS
5.0000 mg | ORAL_TABLET | Freq: Every day | ORAL | Status: DC
  Administered 2021-03-22 – 2021-03-25 (×4): 5 mg via ORAL
  Filled 2021-03-22 (×4): qty 1

## 2021-03-22 NOTE — Care Management Obs Status (Signed)
[place] NOTIFICATION   Patient Details  Name: [name] MRN: [id number] Date of Birth: 07-23-1975   Medicare Observation Status Notification Given:  Yes    Joanne Chars, LCSW 03/22/2021, 10:50 AM

## 2021-03-22 NOTE — Progress Notes (Signed)
FPTS Brief Progress Note  S: Patient sound asleep in bed.  I did not wake her up.  Discussed with RN over phone, no concerns at this time.  O: BP 96/68 (BP Location: Left Arm)   Pulse (!) 107   Temp 98.5 F (36.9 C) (Oral)   Resp 20   Ht 5' 2"  (1.575 m)   Wt 65.8 kg   SpO2 100%   BMI 26.52 kg/m    A/P:  Seizure-like activity No seizure activity reported in chart this evening.  Depakote DR 1250 mg restarted this evening per neuro.  Of note, her identical twin sister (who has many of the same medical conditions) was admitted tonight for seizure of unknown etiology.    COVID positive Patient was mildly febrile with fever of 100.5 F just after 8 PM.  Also had tachycardia up to 130.  Responded well to Tylenol 650 mg.  Patient is currently afebrile.  We will continue to monitor closely.  SPO2 is stable on room air. -Awaiting repeat COVID test per request of group home   Schizophrenia  tardive dyskinesia Patient on home meds.  No changes to plan from day team.   T2DM Stable.  Evening CBG 138, administered 1 unit insulin aspart.   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 check 11/10.  Day team restarted lisinopril to 5 mg nightly.  No changes to plan from day team.     - Orders reviewed. Labs for AM ordered, which was adjusted as needed.  - If condition changes, plan includes supportive management.   Ezequiel Essex, MD 03/22/2021, 3:48 AM PGY-2, [place] [facility] Night Resident  Please page [phone] with questions.

## 2021-03-22 NOTE — Progress Notes (Signed)
Pt gets up w/o calling for assistance and went to the [facility]rhoom.... sts she had a BM.\

## 2021-03-22 NOTE — Progress Notes (Addendum)
Family Medicine Teaching Service Daily Progress Note Intern Pager: [phone]  Patient name: [name] Medical record number: [id number] Date of birth: 1976-03-07 Age: 45 y.o. Gender: female  Primary Care Provider: Leeanne Rio, MD Consultants: Neuro Code Status: DNR, but chest compressions/medications/shock okay  Pt Overview and Major Events to Date:  11/9: Patient admitted to [place]. 11/10: EEG completed  Assessment and Plan: Patient is a 45 year old female presenting with seizure-like activity and COVID positive.  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  Seizure-like activity EEG showed no epileptiform changes, mild diffuse encephalopathy. - Per neuro, initiated 1 time VPA 1500 mg on 11/10, and continue Depakote ER 1250 mg.  Outpatient follow-up to be arranged.  COVID-positive Repeat test positive.  Patient became febrile overnight to 100.5 degrees, which responded well to Tylenol 650 mg.  She was also tachycardic up to 130 at this time.  Since, she has been afebrile, but remains tachycardic.  SPO2 stable on room air. -Continue to monitor vitals - To quarantine inpatient x 5 days; conveyed [facility] to [facility] with permission from guardian.  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 - Continue home Cogentin 1 mg daily, Clozaril 400 mg nightly, Haldol 5 mg nightly, Seroquel 25 mg nightly, Paxil 10 mg daily, and Ingrezza 40 mg nightly.  T2DM CBG 24-hour 112-189.  - Restart home meds: Januvia 50 mg QD and Glipizide 10 mg BID. 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 She has had 2 BM over past 24 hours with soft, but formed stools.  Objective: Temp:  [98.5 F (36.9 C)-100.5 F (38.1 C)] 98.5 F (36.9 C) (11/11 0344) Pulse Rate:  [107-130] 107 (11/11 0344) Resp:  [20] 20 (11/11 0344) BP: (95-109)/(62-73) 96/68 (11/11 0344) SpO2:  [99 %-100 %] 100 % (11/11 0344) Physical Exam: General: Age congruent appearing female, Cardiovascular: RRR, no murmurs or abnormal heart sounds appreciated Respiratory: CTA x2 Abdomen: Soft, nontender, nondistended Extremities: Warm, nonedematous BLE Neurological: 4/5 strength in BUE and BLE. Mild tremor of bilateral outstretched hands. Cerebellar dysfunction noted with lack of coordination in rapid movements (touching each of fingers to thumbs). Psychiatric: Normal mood and affect, does not appear to be internally preoccupied.   Laboratory: Recent Labs  Lab 03/20/21 1233  WBC 7.1  HGB 12.1  HCT 37.2  PLT 162   Recent Labs  Lab 03/20/21 1233 03/21/21 0427 03/22/21 0402  NA 139 140 141  K 5.2* 4.4 4.4  CL 109 110 112*  CO2 17* 21* 22  BUN 19 15 21*  CREATININE 1.96* 1.79* 2.01*  CALCIUM 8.6* 8.8* 8.2*  PROT 6.2*  --   --   BILITOT 0.5  --   --   ALKPHOS 79  --   --   ALT 14  --   --  AST 21  --   --   GLUCOSE 264* 207* 152*   SARS Coronavirus 2 NEGATIVE POSITIVE Abnormal    Comment: (NOTE)  SARS-CoV-2 target nucleic acids are DETECTED.     Imaging/Diagnostic Tests: None new  Rosezetta Schlatter, MD 03/22/2021, 7:51 AM PGY-1, [place] Intern pager: [phone], text pages welcome

## 2021-03-23 DIAGNOSIS — R569 Unspecified convulsions: Secondary | ICD-10-CM | POA: Diagnosis not present

## 2021-03-23 LAB — CBC
HCT: 30.8 % — ABNORMAL LOW (ref 36.0–46.0)
Hemoglobin: 10 g/dL — ABNORMAL LOW (ref 12.0–15.0)
MCH: 29.5 pg (ref 26.0–34.0)
MCHC: 32.5 g/dL (ref 30.0–36.0)
MCV: 90.9 fL (ref 80.0–100.0)
Platelets: 134 10*3/uL — ABNORMAL LOW (ref 150–400)
RBC: 3.39 MIL/uL — ABNORMAL LOW (ref 3.87–5.11)
RDW: 13.3 % (ref 11.5–15.5)
WBC: 7.4 10*3/uL (ref 4.0–10.5)
nRBC: 0 % (ref 0.0–0.2)

## 2021-03-23 LAB — BASIC METABOLIC PANEL
Anion gap: 5 (ref 5–15)
Anion gap: 7 (ref 5–15)
BUN: 28 mg/dL — ABNORMAL HIGH (ref 6–20)
BUN: 27 mg/dL — ABNORMAL HIGH (ref 6–20)
CO2: 26 mmol/L (ref 22–32)
CO2: 25 mmol/L (ref 22–32)
Calcium: 8.9 mg/dL (ref 8.9–10.3)
Calcium: 9.1 mg/dL (ref 8.9–10.3)
Chloride: 112 mmol/L — ABNORMAL HIGH (ref 98–111)
Chloride: 108 mmol/L (ref 98–111)
Creatinine, Ser: 1.83 mg/dL — ABNORMAL HIGH (ref 0.44–1.00)
Creatinine, Ser: 1.85 mg/dL — ABNORMAL HIGH (ref 0.44–1.00)
GFR, Estimated: 34 mL/min — ABNORMAL LOW (ref >60)
GFR, Estimated: 33 mL/min — ABNORMAL LOW (ref >60)
Glucose, Bld: 128 mg/dL — ABNORMAL HIGH (ref 70–99)
Glucose, Bld: 322 mg/dL — ABNORMAL HIGH (ref 70–99)
Potassium: 5 mmol/L (ref 3.5–5.1)
Potassium: 5.1 mmol/L (ref 3.5–5.1)
Sodium: 143 mmol/L (ref 135–145)
Sodium: 140 mmol/L (ref 135–145)

## 2021-03-23 LAB — COLOGUARD: COLOGUARD: NEGATIVE

## 2021-03-23 MED ORDER — SODIUM CHLORIDE 0.9 % IV SOLN
25.0000 mg | Freq: Once | INTRAVENOUS | Status: AC
  Administered 2021-03-23: 25 mg via INTRAVENOUS
  Filled 2021-03-23: qty 1

## 2021-03-23 MED ORDER — ONDANSETRON HCL 4 MG/2ML IJ SOLN
4.0000 mg | Freq: Four times a day (QID) | INTRAMUSCULAR | Status: DC | PRN
  Administered 2021-03-23: 4 mg via INTRAVENOUS
  Filled 2021-03-23: qty 2

## 2021-03-23 NOTE — Progress Notes (Signed)
FPTS Brief Progress Note  S: No concerns at this time, patient is resting comfortably.  Did not wake patient.  Did discuss with nurse there was a order for a blood glucose that was not POC.  Order switched to POC CBG.   O: BP 108/77 (BP Location: Left Arm)   Pulse (!) 109   Temp 99.4 F (37.4 C) (Oral)   Resp 18   Ht 5' 2"  (1.575 m)   Wt 65.8 kg   SpO2 100%   BMI 26.52 kg/m     A/P: Insomnia Patient sleeping on evaluation.  She received Seroquel 25 mg. - Orders reviewed. Labs for AM not ordered, which was adjusted as needed.   Gifford Shave, MD 03/23/2021, 11:04 PM PGY-3, [place] Night Resident  Please page [phone] with questions.

## 2021-03-23 NOTE — Plan of Care (Signed)
FPTS Interim Progress Note  Call from RN, Lenna Sciara that patient has had an episode of emesis. Ordered IV Zofran q6h PRN and verbal order for CBG check.  Rosezetta Schlatter, MD 03/23/2021, 5:04 PM PGY-1, [place] Medicine Service pager [phone]

## 2021-03-23 NOTE — Plan of Care (Signed)

## 2021-03-23 NOTE — Progress Notes (Signed)
Nutrition Brief Note  RD consulted for hypoalbuminemia.   Admitting Dx: Seizures ([place]) [R56.9] Seizure-like activity ([place]) [R56.9] PMH:  Past Medical History:  Diagnosis Date   Acne    Anemia    Cognitive impairment    Diabetes mellitus without complication ([place])    Internal hemorrhoid, bleeding 10/22/2010   Obesity    Schizoaffective disorder    SVT (supraventricular tachycardia) ([facility])    with short PR interval - followed by Dr. Lovena Le    Medications:   atorvastatin  20 mg Oral QHS   benztropine  1 mg Oral Daily   cloZAPine  400 mg Oral QHS   divalproex  1,250 mg Oral QHS   enoxaparin (LOVENOX) injection  40 mg Subcutaneous Q24H   famotidine  20 mg Oral BID   glipiZIDE  10 mg Oral BID AC   haloperidol  5 mg Oral QHS   linagliptin  5 mg Oral Daily   metoprolol succinate  25 mg Oral QHS   PARoxetine  10 mg Oral Daily   polyethylene glycol  17 g Oral Daily   QUEtiapine  25 mg Oral QHS   valbenazine  40 mg Oral QHS   Labs: Recent Labs  Lab 03/20/21 1233 03/21/21 0427 03/22/21 0402  NA 139 140 141  K 5.2* 4.4 4.4  CL 109 110 112*  CO2 17* 21* 22  BUN 19 15 21*  CREATININE 1.96* 1.79* 2.01*  CALCIUM 8.6* 8.8* 8.2*  MG  --  2.3  --   GLUCOSE 264* 207* 152*  CBGs: 138-191-163-290  Wt Readings from Last 10 Encounters:  03/20/21 65.8 kg  02/26/21 60.3 kg  12/06/20 59.4 kg  07/03/20 61.9 kg  05/15/20 62.1 kg  03/27/20 63 kg  02/28/20 64.3 kg  02/13/20 65.7 kg  01/12/20 67 kg  12/13/19 67.7 kg   Body mass index is 26.52 kg/m. Patient meets criteria for normal based on current BMI.   Current diet order is regular. Pt denies changes to appetite or weight.   When a patient presents with low albumin, it is likely skewed due to the acute inflammatory response.  Unless it is suspected that patient had poor PO intake or malnutrition prior to admission, then RD should not be consulted solely for low albumin. Note that low albumin is no longer used to diagnose  malnutrition; [place] uses the new malnutrition guidelines published by the American Society for Parenteral and Enteral Nutrition (A.S.P.E.N.) and the Academy of Nutrition and Dietetics (AND).    No nutrition interventions warranted at this time. If nutrition issues arise, please consult RD.    Theone Stanley., MS, RD, LDN (she/her/hers) RD pager number and weekend/on-call pager number located in [place].

## 2021-03-23 NOTE — Progress Notes (Signed)
Family Medicine Teaching Service Daily Progress Note Intern Pager: [phone]  Patient name: [name] Medical record number: [id number] Date of birth: [birth year] Age: 45 y.o. Gender: female  Primary Care Provider: Leeanne Rio, MD Consultants: Neurology Code Status: DNI, but chest compressions/medications/shock okay  Pt Overview and Major Events to Date:  11/9: Patient admitted to [facility]. 11/10: EEG completed  Assessment and Plan: Patient is a 45 year old female who presented with seizure-like activity, found to be non-epileptiform on EEG.  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  Seizure-like activity EEG showed mild diffuse encephalopathy, but no epileptiform changes. - Per neuro, initiated 1 time VPA 1500 mg on 11/10, and continue Depakote ER 1250 mg.  Outpatient follow-up to be arranged.  COVID-positive Repeat test positive.  Patient has remained afebrile over the past 24 hours, no leukocytosis, but remains tachycardic up to 109 bpm.  SPO2 96 to 100% on room air. - Continue to monitor vitals - To quarantine inpatient x5 days (ends 11/14), and guardian and HC POA in agreement.  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 - Continue home Cogentin 1 mg daily, Clozaril 400 mg nightly, Haldol 5 mg nightly, Seroquel 25 mg nightly, Paxil 10 mg daily, and Ingrezza 40 mg nightly.   T2DM AM CBG 240, blood glucose 128 on AM BMP. Home meds: Glipizide 10 mg BID and Januvia 50 mg QD. -Continue Glipizide 10 mg BID and Tradjenta 5 mg daily.  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 - Continue to encourage PO fluid intake.  - Avoid nephrotoxic agents.  Soft BP, resolved 24 hr BP: Systolic 428-768.   -Continue to hold home Lisinopril.   GERD Chronic, stable - Continue home Pepcid 20 mg BID  Dyslipidemia Chronic, stable - Continue home Lipitor 20 mg nightly.   FEN/GI: Regular PPx: Lovenox Dispo:continued admission for quarantine, medically cleared for discharge  Subjective:  Patient reports diaphoresis, chills, and non-productive cough overnight but denies all pain, dyspnea, loss of senses, and N/V/D.    Objective: Temp:  [97.9 F (36.6 C)-99.6 F (37.6 C)] 99.6 F (37.6 C) (11/12 0435) Pulse Rate:  [96-122] 106 (11/12 0435) Resp:  [15-20] 15 (11/12 0435) BP: (87-126)/(61-89) 102/75 (11/12 0435) SpO2:  [99 %-100 %] 99 % (11/12 0435) Physical Exam: General: Age congruent appearing female in no acute distress Cardiovascular: RRR, no murmurs nor abnormal heart sounds appreciated Respiratory: Mild cough, non-productive witnessed x 1; normal effort on room air. CTAB anteriorly; patient would refused to move from lying position. Abdomen: Soft, NTND Extremities: Warm, non-edematous BLE Neurological: Mild tremor of bilateral hands with lack of coordination in rapid movements.  Psychiatric: Normal mood and affect; does not appear to be internally preoccupied.   Laboratory: Recent Labs  Lab 03/20/21 1233 03/22/21 0801 03/23/21 0231  WBC 7.1 6.8 7.4  HGB 12.1 9.8* 10.0*  HCT 37.2 31.4* 30.8*  PLT 162 139* 134*   Recent Labs  Lab 03/20/21 1233 03/21/21 0427 03/22/21 0402 03/23/21 0231  NA 139 140 141 143  K 5.2* 4.4 4.4 5.0  CL 109 110 112* 112*  CO2 17* 21* 22 26  BUN 19 15 21* 28*  CREATININE 1.96* 1.79* 2.01* 1.83*  CALCIUM 8.6* 8.8* 8.2* 8.9  PROT 6.2*  --   --   --  BILITOT 0.5  --   --   --   ALKPHOS 79  --   --   --   ALT 14  --   --   --   AST 21  --   --   --   GLUCOSE 264* 207* 152* 128*    Imaging/Diagnostic Tests: None new  Rosezetta Schlatter, MD 03/23/2021, 7:18 AM PGY-1, [place] Intern pager: [phone], text pages  welcome

## 2021-03-23 NOTE — Progress Notes (Signed)
FPTS Brief Progress Note  S:Patient sleeping comfortably in bed. RN staff reports that patient continues to get out of bed, but is easily redirectable. No complaints or concerns   O: BP 96/68   Pulse (!) 109   Temp 99.3 F (37.4 C) (Oral)   Resp 16   Ht 5' 2"  (1.575 m)   Wt 65.8 kg   SpO2 99%   BMI 26.52 kg/m     A/P: Insomnia Patient received nightly dose of Seroquel 25 mg, can increase if issues with falling /staying asleep  Seizure-like activity Continue plans per day team    Holley Bouche, MD 03/23/2021, 2:17 AM PGY-1, [place] Family Medicine Night Resident  Please page [phone] with questions.

## 2021-03-24 DIAGNOSIS — R569 Unspecified convulsions: Secondary | ICD-10-CM | POA: Diagnosis not present

## 2021-03-24 MED ORDER — LISINOPRIL 2.5 MG PO TABS
2.5000 mg | ORAL_TABLET | Freq: Every day | ORAL | Status: DC
  Filled 2021-03-24: qty 1

## 2021-03-24 NOTE — Progress Notes (Signed)
Dr. Ezequiel Essex, on-call for attending, notified of pt's SBPs 90s-100s. Verbal order received to hold tonight's dose of Lisinopril but to administer pt's Lopressor. Will administer and continue to monitor and assess.

## 2021-03-24 NOTE — Progress Notes (Signed)
FPTS Brief Progress Note  S:No complaints voiced when I was in the room  O: BP 97/73 (BP Location: Left Arm)   Pulse 100   Temp 98.5 F (36.9 C) (Oral)   Resp 14   Ht 5' 2"  (1.575 m)   Wt 65.8 kg   SpO2 94%   BMI 26.52 kg/m   Gen: alert and responsive to questions Resp: breathing comfortably on room air  A/P: Seizure Like Activity No seizure like movements noted while in room.  -depakote ER 1250 mg daily  Hypotension 101/68 BP in room  -Held lisinopril night dose -metoprolol dose given due to tachycardia to low 100s - Orders reviewed. Labs for AM ordered, which was adjusted as needed.   Gerrit Heck, MD 03/24/2021, 9:15 PM PGY-1, [place] Night Resident  Please page [phone] with questions.

## 2021-03-24 NOTE — Progress Notes (Addendum)
Family Medicine Teaching Service Daily Progress Note Intern Pager: [phone]  Patient name: [name] Medical record number: [id number] Date of birth: March 08, 1976 Age: 45 y.o. Gender: female  Primary Care Provider: Leeanne Rio, MD Consultants: Neurology Code Status: DNI, but chest compressions/medications/shocks okay  Pt Overview and Major Events to Date:  11/9: Patient admitted to [facility]. 11/10: EEG completed  Assessment and Plan: [name] is a 45 year-old female who presented with seizure-like activity, found to be non-epileptiform on EEG. 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  Seizure-like activity No further seizures/seizure-like activity. No noticeable twitching on exam this morning. Seems to be at her neurologic baseline. - Per neuro reccs: Continue Depakote ER 1250 mg - Will have outpatient follow-up with neuro  COVID+ on 03/20/21 Mildly symptomatic vaccinated patient. Has loose stools and cough. Afebrile over 24h. Quarantining for and will return to group home. SpO2 98% on room air. - Monitor VS - Will return to group home  T2DM Last CBG 152. - Continue Glipizide 98m BID - Continue Tradjenta 539mdaily - CBG 4x daily, at meals and bedtime  CKD Stage 3, s/p partial left nephrectomy and papillary renal cell carcinoma Last Cr at baseline on 11/12 at 1.83. - Encourage PO intake - Avoid nephrotoxic agents  Hypotension- resolved 24 hr BP, systolic 10885-027BP  - Restart home lisinopril  Schizophrenia with history of Tardive Dyskinesia Hx TD 2/2 long-term antipsychotic use. - Continue home regimen  Other conditions (GERD, Dyslipidemia) chronic and stable.  FEN/GI: Regular PPx: Lovenox Dispo: Quarantining for COVID+ status, will return to group home afterward. Medically clear for discharge  Subjective:  Pt resting comfortably in bed. Asking for Ensure drink.  Says she's having loose stools but no other complaints.   Objective: Temp:  [97.6 F (36.4 C)-99.4 F (37.4 C)] 98.6 F (37 C) (11/13 0416) Pulse Rate:  [103-114] 105 (11/13 0416) Resp:  [14-18] 16 (11/13 0416) BP: (106-125)/(72-109) 124/78 (11/13 0416) SpO2:  [96 %-100 %] 100 % (11/13 0416) Physical Exam: General: Laying in bed in no distress. Dried drool around mouth Cardiovascular: RRR Respiratory: Normal respiratory effort on room air. Lungs CTAB.  Abdomen: Soft, non-tender, non-distended Extremities: Warm, dry, well-perfused Neurological: Cognitive impairment. No noted acute deficits.  Laboratory: Recent Labs  Lab 03/20/21 1233 03/22/21 0801 03/23/21 0231  WBC 7.1 6.8 7.4  HGB 12.1 9.8* 10.0*  HCT 37.2 31.4* 30.8*  PLT 162 139* 134*   Recent Labs  Lab 03/20/21 1233 03/21/21 0427 03/22/21 0402 03/23/21 0231 03/23/21 1844  NA 139   < > 141 143 140  K 5.2*   < > 4.4 5.0 5.1  CL 109   < > 112* 112* 108  CO2 17*   < > 22 26 25   BUN 19   < > 21* 28* 27*  CREATININE 1.96*   < > 2.01* 1.83* 1.85*  CALCIUM 8.6*   < > 8.2* 8.9 9.1  PROT 6.2*  --   --   --   --   BILITOT 0.5  --   --   --   --   ALKPHOS 79  --   --   --   --   ALT 14  --   --   --   --   AST 21  --   --   --   --   GLUCOSE 264*   < > 152* 128* 322*   < > =  values in this interval not displayed.    Imaging/Diagnostic Tests: No results found.   Orvis Brill, DO 03/24/2021, 8:04 AM PGY-1, [place] Intern pager: [phone], text pages welcome

## 2021-03-24 NOTE — Progress Notes (Addendum)
CBG from 0831 today is 152. (Docked glucometer not transferring data over.)

## 2021-03-25 DIAGNOSIS — Z85528 Personal history of other malignant neoplasm of kidney: Secondary | ICD-10-CM | POA: Diagnosis not present

## 2021-03-25 DIAGNOSIS — E785 Hyperlipidemia, unspecified: Secondary | ICD-10-CM | POA: Diagnosis present

## 2021-03-25 DIAGNOSIS — N179 Acute kidney failure, unspecified: Secondary | ICD-10-CM | POA: Diagnosis present

## 2021-03-25 DIAGNOSIS — G4700 Insomnia, unspecified: Secondary | ICD-10-CM | POA: Diagnosis present

## 2021-03-25 DIAGNOSIS — Z66 Do not resuscitate: Secondary | ICD-10-CM | POA: Diagnosis present

## 2021-03-25 DIAGNOSIS — I9589 Other hypotension: Secondary | ICD-10-CM | POA: Diagnosis not present

## 2021-03-25 DIAGNOSIS — G2401 Drug induced subacute dyskinesia: Secondary | ICD-10-CM | POA: Diagnosis present

## 2021-03-25 DIAGNOSIS — T43595A Adverse effect of other antipsychotics and neuroleptics, initial encounter: Secondary | ICD-10-CM | POA: Diagnosis present

## 2021-03-25 DIAGNOSIS — G4089 Other seizures: Secondary | ICD-10-CM | POA: Diagnosis present

## 2021-03-25 DIAGNOSIS — E8809 Other disorders of plasma-protein metabolism, not elsewhere classified: Secondary | ICD-10-CM | POA: Diagnosis present

## 2021-03-25 DIAGNOSIS — U071 COVID-19: Secondary | ICD-10-CM | POA: Diagnosis present

## 2021-03-25 DIAGNOSIS — Z7984 Long term (current) use of oral hypoglycemic drugs: Secondary | ICD-10-CM | POA: Diagnosis not present

## 2021-03-25 DIAGNOSIS — F259 Schizoaffective disorder, unspecified: Secondary | ICD-10-CM | POA: Diagnosis present

## 2021-03-25 DIAGNOSIS — K219 Gastro-esophageal reflux disease without esophagitis: Secondary | ICD-10-CM | POA: Diagnosis present

## 2021-03-25 DIAGNOSIS — E1122 Type 2 diabetes mellitus with diabetic chronic kidney disease: Secondary | ICD-10-CM | POA: Diagnosis present

## 2021-03-25 DIAGNOSIS — Z818 Family history of other mental and behavioral disorders: Secondary | ICD-10-CM | POA: Diagnosis not present

## 2021-03-25 DIAGNOSIS — Z79899 Other long term (current) drug therapy: Secondary | ICD-10-CM | POA: Diagnosis not present

## 2021-03-25 DIAGNOSIS — N1831 Chronic kidney disease, stage 3a: Secondary | ICD-10-CM | POA: Diagnosis present

## 2021-03-25 DIAGNOSIS — R569 Unspecified convulsions: Secondary | ICD-10-CM | POA: Diagnosis not present

## 2021-03-25 LAB — GLUCOSE, CAPILLARY
Glucose-Capillary: 241 mg/dL — ABNORMAL HIGH (ref 70–99)
Glucose-Capillary: 228 mg/dL — ABNORMAL HIGH (ref 70–99)
Glucose-Capillary: 248 mg/dL — ABNORMAL HIGH (ref 70–99)
Glucose-Capillary: 97 mg/dL (ref 70–99)
Glucose-Capillary: 242 mg/dL — ABNORMAL HIGH (ref 70–99)
Glucose-Capillary: 182 mg/dL — ABNORMAL HIGH (ref 70–99)
Glucose-Capillary: 115 mg/dL — ABNORMAL HIGH (ref 70–99)
Glucose-Capillary: 152 mg/dL — ABNORMAL HIGH (ref 70–99)
Glucose-Capillary: 265 mg/dL — ABNORMAL HIGH (ref 70–99)
Glucose-Capillary: 141 mg/dL — ABNORMAL HIGH (ref 70–99)
Glucose-Capillary: 257 mg/dL — ABNORMAL HIGH (ref 70–99)
Glucose-Capillary: 147 mg/dL — ABNORMAL HIGH (ref 70–99)
Glucose-Capillary: 282 mg/dL — ABNORMAL HIGH (ref 70–99)

## 2021-03-25 LAB — CBC
HCT: 32.4 % — ABNORMAL LOW (ref 36.0–46.0)
Hemoglobin: 10.4 g/dL — ABNORMAL LOW (ref 12.0–15.0)
MCH: 29.6 pg (ref 26.0–34.0)
MCHC: 32.1 g/dL (ref 30.0–36.0)
MCV: 92.3 fL (ref 80.0–100.0)
Platelets: 146 10*3/uL — ABNORMAL LOW (ref 150–400)
RBC: 3.51 MIL/uL — ABNORMAL LOW (ref 3.87–5.11)
RDW: 13.5 % (ref 11.5–15.5)
WBC: 6.9 10*3/uL (ref 4.0–10.5)
nRBC: 0.3 % — ABNORMAL HIGH (ref 0.0–0.2)

## 2021-03-25 LAB — BASIC METABOLIC PANEL
Anion gap: 6 (ref 5–15)
BUN: 33 mg/dL — ABNORMAL HIGH (ref 6–20)
CO2: 23 mmol/L (ref 22–32)
Calcium: 8.7 mg/dL — ABNORMAL LOW (ref 8.9–10.3)
Chloride: 108 mmol/L (ref 98–111)
Creatinine, Ser: 2.05 mg/dL — ABNORMAL HIGH (ref 0.44–1.00)
GFR, Estimated: 30 mL/min — ABNORMAL LOW (ref >60)
Glucose, Bld: 202 mg/dL — ABNORMAL HIGH (ref 70–99)
Potassium: 5 mmol/L (ref 3.5–5.1)
Sodium: 137 mmol/L (ref 135–145)

## 2021-03-25 MED ORDER — DIVALPROEX SODIUM ER 250 MG PO TB24: 1250.0000 mg | ORAL_TABLET | Freq: Every day | ORAL | 0 refills | Status: DC

## 2021-03-25 NOTE — Hospital Course (Addendum)
Patient is a 46 year old female presenting with seizure-like activity and COVID positive.  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  Seizure-like activity Patient from group home with witnessed seizure-like activity and postictal episode, given 5 mg of Versed IM by EMS.  EEG showed no epileptiform changes, but mild diffuse encephalopathy.  Neurology gave patient 1 time VPA 1500 mg, then increased home Depakote to 1250 mg.  No further episodes during this admission.  COVID-19 positive Incidentally found to be COVID-positive. Patient received booster vaccination on 10/18.  Patient presented asymptomatic.  During this admission, she had 1 febrile episode to 100.5 degrees, relieved with Tylenol.  She also developed a nonproductive cough, but denied all other related symptoms.  Schizophrenia w/ h/o tardive dyskinesia Chronic, stable.  Patient's home medications were continued: Cogentin 1 mg daily, Clozaril 400 mg nightly, Haldol 5 mg nightly, Seroquel 25 mg nightly, Paxil 10 mg daily, and Ingrezza 40 mg nightly.  T2DM Patient's blood glucose was stable throughout admission with sliding scale insulin.  On 11/11, patient's home medications were resumed: Januvia 50 mg and glipizide 10 mg twice daily.  CKD Stage III Patient's baseline 1.65. Cr remained within baseline range throughout admission, but improved with oral hydration.   Chronic conditions, maintained with home medications: GERD  Dyslipidemia   PCP Follow up Tasks: - referral for neurology outpatient placed - lisinopril held due to hypotension recommend assess if needed.  - Depakote 1230m added during hospitalization - consider switching beta blocker for ACE/ARB with hx diabetes and renal insufficiency - consider adding SGLT2 if renal insufficiency allows  - consider discontinuing Paxil, as it has moderate interaction potential to increase the  efficacy of Clozaril, which may have contributed to patient's presentation.

## 2021-03-25 NOTE — Discharge Summary (Addendum)
[facility] Discharge Summary  Patient name: [name] Medical record number: [id number] Date of birth: 1975/06/01 Age: 45 y.o. 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 Abdomen: Soft, NTND Extremities: 4/5 strength in BUE and BLE; normal ambulation Neurological: Cognitively impaired but no acute deficits.  Psychiatric: Normal mood, constricted affect  Brief Hospital Course:  Patient is a 45 year old female presenting with seizure-like activity and COVID positive.  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  Seizure-like activity Patient from group home with witnessed seizure-like activity and postictal episode, given 5 mg of Versed IM by EMS.  EEG showed no epileptiform changes, but mild diffuse encephalopathy.  Neurology gave patient one-time Valproic acid 1500 mg, then increased home Depakote to 1250 mg.  No further episodes during this admission.  COVID-19 positive Incidentally found to be COVID-positive. Patient received booster vaccination on 10/18.  Patient presented  asymptomatic.  During this admission, she had one febrile episode to 100.5 F degrees, relieved with Tylenol.  She also developed a nonproductive cough, but denied all other related symptoms. Pt completed 5-day quarantine prior to discharge.     Chronic conditions, maintained with home medications: GERD  Dyslipidemia CKD T2DM Schizophrenia   PCP Follow up Tasks: - Referral for neurology outpatient placed, if no follow up scheduled then place new referral.  - Lisinopril held due to hypotension recommend assess restart if needed.  - Depakote increased to 1250 mg  during hospitalization - Consider switching beta blocker for ACE/ARB with hx diabetes and renal insufficiency - Consider adding SGLT2 if renal insufficiency allows  - Consider discontinuing Paxil, as it has moderate interaction potential to increase the efficacy of Clozaril, which may have contributed to patient's presentation.   Significant Procedures: EEG, showing mild diffuse encephalopathy, no epileptiform changes.  Significant Labs and Imaging:  Recent Labs  Lab 03/22/21 0801 03/23/21 0231 03/25/21 0341  WBC 6.8 7.4 6.9  HGB 9.8* 10.0* 10.4*  HCT 31.4* 30.8* 32.4*  PLT 139* 134* 146*   Recent Labs  Lab 03/20/21 1233 03/21/21 0427 03/22/21 0402 03/23/21 0231 03/23/21 1844 03/25/21 0341  NA 139 140 141 143 140 137  K 5.2* 4.4 4.4 5.0 5.1 5.0  CL 109 110 112* 112* 108 108  CO2 17* 21* 22 26 25 23   GLUCOSE 264* 207* 152* 128* 322* 202*  BUN 19 15 21* 28* 27* 33*  CREATININE 1.96* 1.79* 2.01* 1.83* 1.85* 2.05*  CALCIUM 8.6* 8.8* 8.2* 8.9 9.1 8.7*  MG  --  2.3  --   --   --   --   ALKPHOS 79  --   --   --   --   --   AST 21  --   --   --   --   --  ALT 14  --   --   --   --   --   ALBUMIN 3.0*  --   --   --   --   --    EXAM: CT HEAD WITHOUT CONTRAST   FINDINGS: Brain: No evidence of acute infarction, hemorrhage, hydrocephalus, extra-axial collection or mass lesion/mass effect.   Vascular: No  hyperdense vessel or unexpected calcification.   Skull: No osseous abnormality. Incidental note made of hyperostosis frontalis internus.   Sinuses/Orbits: Right maxillary sinus mucous retention cyst. Visualized mastoid sinuses are clear. Visualized orbits demonstrate no focal abnormality.   IMPRESSION: No acute intracranial pathology.     Electronically Signed   By: Kathreen Devoid M.D.   On: 03/20/2021 13:11  EXAM: MRI HEAD WITHOUT AND WITH CONTRAST   FINDINGS: Brain: No restricted diffusion to suggest acute infarct. No acute hemorrhage, mass, mass effect, or midline shift. No abnormal enhancement. No extra-axial collection or hydrocephalus. Mildly advanced cerebral atrophy for age.   Vascular: Normal flow voids.   Skull and upper cervical spine: Normal marrow signal. Hyperostosis frontalis.   Sinuses/Orbits: Mucous retention cysts in the right greater than left maxillary sinus. Otherwise negative.   Other: Trace fluid in the mastoid air cells   IMPRESSION: No acute intracranial process.  No evidence of brain mass or lesion.     Electronically Signed   By: Merilyn Baba M.D.   On: 03/20/2021 18:40   Results/Tests Pending at Time of Discharge: None  Discharge Medications:  Allergies as of 03/25/2021       Reactions   Minocycline    Unknown         Medication List     STOP taking these medications    lisinopril 2.5 MG tablet Commonly known as: ZESTRIL       TAKE these medications    atorvastatin 20 MG tablet Commonly known as: LIPITOR TAKE ONE TABLET EACH DAY What changed: See the new instructions.   benztropine 1 MG tablet Commonly known as: COGENTIN Take 1 mg by mouth daily.   cetaphil cream APPLY DAILY What changed: how much to take   cloZAPine 100 MG tablet Commonly known as: CLOZARIL Take 400 mg by mouth at bedtime.   divalproex 250 MG 24 hr tablet Commonly known as: DEPAKOTE ER Take 5 tablets (1,250 mg total) by mouth at  bedtime. What changed:  medication strength how much to take   docusate sodium 100 MG capsule Commonly known as: COLACE TAKE ONE CAPSULE EACH DAY What changed: See the new instructions.   famotidine 20 MG tablet Commonly known as: PEPCID TAKE ONE TABLET TWICE DAILY   Gas-X Extra Strength 125 MG chewable tablet Generic drug: simethicone TAKE ONE TABLET DAILY AS NEEDED FOR GAS What changed: See the new instructions.   glipiZIDE 10 MG tablet Commonly known as: GLUCOTROL TAKE ONE TABLET TWICE DAILY BEFORE MEALS What changed: See the new instructions.   GoodSense Aspirin 81 MG chewable tablet Generic drug: aspirin CHEW 1 TABLET DAILY What changed: See the new instructions.   haloperidol 5 MG tablet Commonly known as: HALDOL Take 5 mg by mouth at bedtime.   Incontinence Brief Medium Misc Wear every night   Ingrezza 40 MG capsule Generic drug: valbenazine Take 40 mg by mouth at bedtime.   medroxyPROGESTERone 150 MG/ML injection Commonly known as: DEPO-PROVERA Inject 150 mg into the muscle every 3 (three) months.   metoprolol succinate 25 MG 24 hr tablet Commonly known as: TOPROL-XL TAKE ONE TABLET  DAILY What changed: when to take this   ONE TOUCH DELICA LANCING DEV Misc Check blood sugar once daily   OneTouch Delica Lancets 99H Misc Check blood sugar once daily   OneTouch Verio test strip Generic drug: glucose blood Check blood sugar once daily   OneTouch Verio w/Device Kit Check blood sugar once daily   PARoxetine 10 MG tablet Commonly known as: PAXIL Take 10 mg by mouth daily.   polyethylene glycol powder 17 GM/SCOOP powder Commonly known as: GLYCOLAX/MIRALAX TAKE 17G AT BEDTIME -CAN GO UPTO 3 TIMESDAILY IF NEEDED What changed: See the new instructions.   QC Pain Relief Extra Strength 500 MG tablet Generic drug: acetaminophen TAKE ONE TABLET EVERY EIGHT HOURS AS NEEDED FOR MILD PAIN OR FEVER What changed: See the new instructions.   QUEtiapine 25  MG tablet Commonly known as: SEROQUEL Take 25 mg by mouth at bedtime.   sitaGLIPtin 50 MG tablet Commonly known as: Januvia TAKE ONE TABLET EACH DAY What changed:  how much to take how to take this when to take this additional instructions   zolpidem 5 MG tablet Commonly known as: AMBIEN Take 5 mg by mouth at bedtime as needed.        Discharge Instructions: Please refer to Patient Instructions section of EMR for full details.  Patient was counseled important signs and symptoms that should prompt return to medical care, changes in medications, dietary instructions, activity restrictions, and follow up appointments.   Follow-Up Appointments:  Neurology, referral submitted PCP, to be scheduled upon discharge Psychiatry, continue regular follow-up.  Rosezetta Schlatter, MD 03/25/2021, 6:30 PM  Lyndee Hensen, DO

## 2021-03-25 NOTE — Progress Notes (Signed)
Inpatient Diabetes Program Recommendations  AACE/ADA: New Consensus Statement on Inpatient Glycemic Control (2015)  Target Ranges:  Prepandial:   less than 140 mg/dL      Peak postprandial:   less than 180 mg/dL (1-2 hours)      Critically ill patients:  140 - 180 mg/dL   Lab Results  Component Value Date   GLUCAP 282 (H) 03/25/2021   HGBA1C 8.1 (H) 03/21/2021    Review of Glycemic Control Results for [name], [name] (MRN [id number]) as of 03/25/2021 14:29  Ref. Range 03/24/2021 11:55 03/24/2021 17:38 03/24/2021 21:27 03/25/2021 06:27 03/25/2021 12:25  Glucose-Capillary Latest Ref Range: 70 - 99 mg/dL 265 (H) 141 (H) 257 (H) 147 (H) 282 (H)   Diabetes history: DM 2 Outpatient Diabetes medications:  Glucotrol 10 mg bid, Januvia 50 mg daily Current orders for Inpatient glycemic control:  Tradjenta 5 mg daily Glucotrol 10 mg bid  Inpatient Diabetes Program Recommendations:   Blood sugars are >hospital goal.  Please consider adding Novolog moderate correction tid with meals and HS scale while in the hospital.   Thanks,  Adah Perl, RN, BC-ADM Inpatient Diabetes Coordinator Pager [phone]  (8a-5p)

## 2021-03-26 ENCOUNTER — Other Ambulatory Visit: Payer: Self-pay

## 2021-03-26 DIAGNOSIS — U071 COVID-19: Secondary | ICD-10-CM | POA: Diagnosis not present

## 2021-03-27 MED ORDER — SITAGLIPTIN PHOSPHATE 50 MG PO TABS: 50.0000 mg | ORAL_TABLET | Freq: Every day | ORAL | 1 refills | Status: DC

## 2021-04-01 ENCOUNTER — Encounter (HOSPITAL_COMMUNITY): Payer: Self-pay | Admitting: Emergency Medicine

## 2021-04-01 ENCOUNTER — Emergency Department (HOSPITAL_COMMUNITY)
Admission: EM | Admit: 2021-04-01 | Discharge: 2021-04-01 | Disposition: A | Discharge status: Home / Self Care | Payer: Medicare Other | Attending: Emergency Medicine | Admitting: Emergency Medicine

## 2021-04-01 ENCOUNTER — Emergency Department (HOSPITAL_COMMUNITY): Payer: Medicare Other

## 2021-04-01 ENCOUNTER — Other Ambulatory Visit: Payer: Self-pay

## 2021-04-01 DIAGNOSIS — Z7984 Long term (current) use of oral hypoglycemic drugs: Secondary | ICD-10-CM | POA: Insufficient documentation

## 2021-04-01 DIAGNOSIS — N183 Chronic kidney disease, stage 3 unspecified: Secondary | ICD-10-CM | POA: Insufficient documentation

## 2021-04-01 DIAGNOSIS — E1140 Type 2 diabetes mellitus with diabetic neuropathy, unspecified: Secondary | ICD-10-CM | POA: Insufficient documentation

## 2021-04-01 DIAGNOSIS — R251 Tremor, unspecified: Secondary | ICD-10-CM | POA: Insufficient documentation

## 2021-04-01 DIAGNOSIS — R739 Hyperglycemia, unspecified: Secondary | ICD-10-CM | POA: Diagnosis not present

## 2021-04-01 DIAGNOSIS — E1122 Type 2 diabetes mellitus with diabetic chronic kidney disease: Secondary | ICD-10-CM | POA: Insufficient documentation

## 2021-04-01 DIAGNOSIS — R404 Transient alteration of awareness: Secondary | ICD-10-CM | POA: Diagnosis not present

## 2021-04-01 DIAGNOSIS — R450 Nervousness: Secondary | ICD-10-CM | POA: Diagnosis not present

## 2021-04-01 DIAGNOSIS — R42 Dizziness and giddiness: Secondary | ICD-10-CM | POA: Diagnosis not present

## 2021-04-01 DIAGNOSIS — R569 Unspecified convulsions: Secondary | ICD-10-CM | POA: Diagnosis not present

## 2021-04-01 LAB — COMPREHENSIVE METABOLIC PANEL
ALT: 15 U/L (ref 0–44)
AST: 18 U/L (ref 15–41)
Albumin: 2.6 g/dL — ABNORMAL LOW (ref 3.5–5.0)
Alkaline Phosphatase: 71 U/L (ref 38–126)
Anion gap: 9 (ref 5–15)
BUN: 22 mg/dL — ABNORMAL HIGH (ref 6–20)
CO2: 19 mmol/L — ABNORMAL LOW (ref 22–32)
Calcium: 8.5 mg/dL — ABNORMAL LOW (ref 8.9–10.3)
Chloride: 115 mmol/L — ABNORMAL HIGH (ref 98–111)
Creatinine, Ser: 1.77 mg/dL — ABNORMAL HIGH (ref 0.44–1.00)
GFR, Estimated: 35 mL/min — ABNORMAL LOW (ref >60)
Glucose, Bld: 180 mg/dL — ABNORMAL HIGH (ref 70–99)
Potassium: 4.6 mmol/L (ref 3.5–5.1)
Sodium: 143 mmol/L (ref 135–145)
Total Bilirubin: 0.3 mg/dL (ref 0.3–1.2)
Total Protein: 5.3 g/dL — ABNORMAL LOW (ref 6.5–8.1)

## 2021-04-01 LAB — URINALYSIS, ROUTINE W REFLEX MICROSCOPIC
Bacteria, UA: NONE SEEN
Bilirubin Urine: NEGATIVE
Glucose, UA: NEGATIVE mg/dL
Hgb urine dipstick: NEGATIVE
Ketones, ur: NEGATIVE mg/dL
Nitrite: NEGATIVE
Protein, ur: NEGATIVE mg/dL
Specific Gravity, Urine: 1.005 (ref 1.005–1.030)
pH: 5 (ref 5.0–8.0)

## 2021-04-01 LAB — CBC WITH DIFFERENTIAL/PLATELET
Abs Immature Granulocytes: 0.05 10*3/uL (ref 0.00–0.07)
Basophils Absolute: 0 10*3/uL (ref 0.0–0.1)
Basophils Relative: 1 %
Eosinophils Absolute: 0.2 10*3/uL (ref 0.0–0.5)
Eosinophils Relative: 2 %
HCT: 33.8 % — ABNORMAL LOW (ref 36.0–46.0)
Hemoglobin: 9.9 g/dL — ABNORMAL LOW (ref 12.0–15.0)
Immature Granulocytes: 1 %
Lymphocytes Relative: 37 %
Lymphs Abs: 2.3 10*3/uL (ref 0.7–4.0)
MCH: 29.3 pg (ref 26.0–34.0)
MCHC: 29.3 g/dL — ABNORMAL LOW (ref 30.0–36.0)
MCV: 100 fL (ref 80.0–100.0)
Monocytes Absolute: 0.6 10*3/uL (ref 0.1–1.0)
Monocytes Relative: 10 %
Neutro Abs: 3.1 10*3/uL (ref 1.7–7.7)
Neutrophils Relative %: 49 %
Platelets: 181 10*3/uL (ref 150–400)
RBC: 3.38 MIL/uL — ABNORMAL LOW (ref 3.87–5.11)
RDW: 13.9 % (ref 11.5–15.5)
WBC: 6.3 10*3/uL (ref 4.0–10.5)
nRBC: 0 % (ref 0.0–0.2)

## 2021-04-01 LAB — PREGNANCY, URINE: Preg Test, Ur: NEGATIVE

## 2021-04-01 LAB — VALPROIC ACID LEVEL: Valproic Acid Lvl: 73 ug/mL (ref 50.0–100.0)

## 2021-04-01 LAB — MAGNESIUM: Magnesium: 2.2 mg/dL (ref 1.7–2.4)

## 2021-04-01 MED ORDER — CLOZAPINE 100 MG PO TABS: 400.0000 mg | ORAL_TABLET | Freq: Every day | ORAL | 0 refills | Status: DC

## 2021-04-01 MED ORDER — CLOZAPINE 100 MG PO TABS
400.0000 mg | ORAL_TABLET | Freq: Once | ORAL | Status: AC
  Administered 2021-04-01: 400 mg via ORAL
  Filled 2021-04-01 (×2): qty 4

## 2021-04-01 MED ORDER — SODIUM CHLORIDE 0.9 % IV SOLN: INTRAVENOUS | Status: DC

## 2021-04-01 MED ORDER — SODIUM CHLORIDE 0.9 % IV BOLUS
1000.0000 mL | Freq: Once | INTRAVENOUS | Status: AC
  Administered 2021-04-01: 1000 mL via INTRAVENOUS

## 2021-04-01 NOTE — ED Provider Notes (Signed)
Spoke with Dr. Quinn Axe who reported at this time patient just needs to continue on her current medications.  Recommending getting a head CT and checking a urine.  Patient's urine is within normal limits and head CT without acute findings.  Patient is stable for discharge back to her group home.   Blanchie Dessert, MD 04/01/21 [phone]

## 2021-04-01 NOTE — ED Notes (Signed)
Rigoberto Noel, the director of pt's group home is at bedside. [name] states she has notified Essie Christine, pt's legal guardian that pt is in the hospital.

## 2021-04-01 NOTE — Discharge Instructions (Addendum)
All the lab work today looked the same.  The neurologist wants you to continue your current seizure medication and follow-up as planned.  CAT scan of your brain was normal today.  A referral was made to neurology today.  Continue to follow-up with your regular doctor as needed.  Continue to take all the same medication you are on right now.

## 2021-04-01 NOTE — ED Notes (Signed)
Pt caretaker verbalized understanding of d/c instructions, meds, and followup care. Denies questions. VSS, no distress noted. Clozapine refilled. Assisted into wheelchair and to POV.

## 2021-04-01 NOTE — ED Triage Notes (Signed)
Pt arrives via EMS from adult care center for seizure. EMS reports pt was having tremors when they arrived. Pt at mental baseline, speech very slow and slow to respond. Pt would stop responding en route, and had small twitching to side of left  side of face. EMS gave versed 51m IM.  Pt more alert after versed and talkative.  BP 119/70. HR 100, CBG 258

## 2021-04-01 NOTE — ED Provider Notes (Signed)
[facility] EMERGENCY DEPARTMENT Provider Note   CSN: [id number] Arrival date & time: 04/01/21  1301     History Chief Complaint  Patient presents with   Seizures     Shalee Paolo is a 45 y.o. female.  Pt presents to the ED today with a possible seizure.  Pt has a hx of cognitive impairment and schizoaffective d/o and was at her group home today when she had seizure like activity.  Pt was admitted here from 11/9-14 for possible seizure like activity. She had an EEG which showed mild diffuse encephalopathy, but no epileptiform changes.  Her Depakote was increased to 1250 mg.  Today, pt was at her group home and started having some shaking and she was not responding.  EMS gave her 5 mg of Versed IM and she is now alert and back to baseline.  She does not complain of any pain.  She wants a pizza or a cheeseburger.  She does not recall feeling bad today.      Past Medical History:  Diagnosis Date   Acne    Anemia    Cognitive impairment    Diabetes mellitus without complication ([place])    Internal hemorrhoid, bleeding 10/22/2010   Obesity    Schizoaffective disorder    SVT (supraventricular tachycardia) ([place])    with short PR interval - followed by Dr. 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   Renal mass 03/08/2020   Hip pain 01/15/2020   Diabetic neuropathy, type II diabetes mellitus ([place]) 05/27/2019   Skin abscess 06/29/2018   GERD (gastroesophageal reflux disease) 06/29/2018   Contraception management 10/11/2017   SVT (supraventricular tachycardia) ([place]) 01/26/2015   Hypotension 12/20/2014   External thrombosed hemorrhoids 12/31/2011   Neck strain 12/31/2011   Acne 11/17/2011   Incontinence of urine 08/19/2010   Type 2 diabetes mellitus with renal  manifestations ([place]) 03/03/2010   CHRONIC KIDNEY DISEASE STAGE III (MODERATE) 03/03/2010   TACHYCARDIA, HX OF 04/13/2008   CONSTIPATION, CHRONIC 04/12/2007   Schizophrenia ([place]) 07/09/2006   MENTAL RETARDATION 07/09/2006    Past Surgical History:  Procedure Laterality Date   BREAST REDUCTION SURGERY  08/10/2005   HYSTEROSCOPY  09/09/2004   w/ removal of polyps   KIDNEY SURGERY Left 05/17/2020   Tumor removal by Dr. Dema Severin   REDUCTION MAMMAPLASTY Bilateral 2007   SPINE SURGERY N/A 04/18/2020   Dr. Arnoldo Morale - pinched nerve of Lumbar Spine     OB History   No obstetric history on file.     Family History  Problem Relation Age of Onset   Schizophrenia Sister     Social History   Tobacco Use   Smoking status: Never   Smokeless tobacco: Never  Vaping Use   Vaping Use: Never used  Substance Use Topics   Alcohol use: No   Drug use: No    Home Medications Prior to Admission medications   Medication Sig Start Date End Date Taking? Authorizing Provider  atorvastatin (LIPITOR) 20 MG tablet TAKE ONE TABLET EACH DAY Patient taking differently: Take 20 mg by mouth at bedtime. 07/29/20   Leeanne Rio, MD  benztropine (COGENTIN) 1 MG tablet Take 1 mg by mouth daily.    [provider]  Blood Glucose Monitoring Suppl (ONETOUCH VERIO) w/Device KIT  Check blood sugar once daily 03/27/20   Leeanne Rio, MD  cloZAPine (CLOZARIL) 100 MG tablet Take 400 mg by mouth at bedtime. 02/26/21   [provider]  divalproex (DEPAKOTE ER) 250 MG 24 hr tablet Take 5 tablets (1,250 mg total) by mouth at bedtime. 03/25/21 04/24/21  Holley Bouche, MD  docusate sodium (COLACE) 100 MG capsule TAKE ONE CAPSULE EACH DAY Patient taking differently: Take 100 mg by mouth at bedtime. 10/03/20   Leeanne Rio, MD  Emollient (CETAPHIL) cream APPLY DAILY Patient taking differently: Apply 1 application topically daily. 07/13/19   Leeanne Rio, MD  famotidine  (PEPCID) 20 MG tablet TAKE ONE TABLET TWICE DAILY Patient taking differently: Take 20 mg by mouth 2 (two) times daily. 08/26/20   Leeanne Rio, MD  GAS-X EXTRA STRENGTH 125 MG chewable tablet TAKE ONE TABLET DAILY AS NEEDED FOR GAS Patient taking differently: Chew 125 mg by mouth daily as needed for flatulence. 06/12/15   Leeanne Rio, MD  glipiZIDE (GLUCOTROL) 10 MG tablet TAKE ONE TABLET TWICE DAILY BEFORE MEALS Patient taking differently: Take 10 mg by mouth 2 (two) times daily before a meal. 12/06/20   Leeanne Rio, MD  glucose blood [facility] VERIO) test strip Check blood sugar once daily 03/27/20   Leeanne Rio, MD  GOODSENSE ASPIRIN 81 MG chewable tablet CHEW 1 TABLET DAILY Patient taking differently: Chew 81 mg by mouth daily. 03/26/20   Leeanne Rio, MD  haloperidol (HALDOL) 5 MG tablet Take 5 mg by mouth at bedtime.    [provider]  Incontinence Supply Disposable (INCONTINENCE BRIEF MEDIUM) MISC Wear every night 05/27/19   Leeanne Rio, MD  INGREZZA 40 MG capsule Take 40 mg by mouth at bedtime. 02/28/21   [provider]  Lancet Devices (ONE TOUCH DELICA LANCING DEV) MISC Check blood sugar once daily 03/27/20   Leeanne Rio, MD  medroxyPROGESTERone (DEPO-PROVERA) 150 MG/ML injection Inject 150 mg into the muscle every 3 (three) months.    [provider]  metoprolol succinate (TOPROL-XL) 25 MG 24 hr tablet TAKE ONE TABLET DAILY Patient taking differently: Take 25 mg by mouth at bedtime. 06/08/20   Leeanne Rio, MD  OneTouch Delica Lancets 96Q MISC Check blood sugar once daily 03/27/20   Leeanne Rio, MD  PARoxetine (PAXIL) 10 MG tablet Take 10 mg by mouth daily.    [provider]  polyethylene glycol powder (GLYCOLAX/MIRALAX) 17 GM/SCOOP powder TAKE 17G AT BEDTIME -CAN GO UPTO 3 TIMESDAILY IF NEEDED Patient taking differently: Take 17 g by mouth at bedtime. 11/29/20   Leeanne Rio, MD  QC PAIN RELIEF EXTRA STRENGTH 500 MG tablet TAKE ONE TABLET EVERY EIGHT HOURS AS NEEDED FOR MILD PAIN OR FEVER Patient taking differently: Take 500 mg by mouth every 8 (eight) hours as needed for mild pain or fever. 01/02/20   Leeanne Rio, MD  QUEtiapine (SEROQUEL) 25 MG tablet Take 25 mg by mouth at bedtime. 12/11/20   [provider]  sitaGLIPtin (JANUVIA) 50 MG tablet Take 1 tablet (50 mg total) by mouth daily. 03/27/21   Leeanne Rio, MD  zolpidem (AMBIEN) 5 MG tablet Take 5 mg by mouth at bedtime as needed. 12/11/20   [provider]    Allergies    Minocycline  Review of Systems   Review of Systems  Neurological:  Positive for seizures.  All other systems reviewed and are negative.  Physical Exam Updated  Vital Signs BP 124/85   Pulse 92   Resp 15   SpO2 100%   Physical Exam Vitals and nursing note reviewed.  Constitutional:      Appearance: Normal appearance.  HENT:     Head: Normocephalic and atraumatic.     Right Ear: External ear normal.     Left Ear: External ear normal.     Nose: Nose normal.     Mouth/Throat:     Mouth: Mucous membranes are moist.     Pharynx: Oropharynx is clear.  Eyes:     Extraocular Movements: Extraocular movements intact.     Conjunctiva/sclera: Conjunctivae normal.     Pupils: Pupils are equal, round, and reactive to light.  Cardiovascular:     Rate and Rhythm: Normal rate and regular rhythm.     Pulses: Normal pulses.     Heart sounds: Normal heart sounds.  Pulmonary:     Effort: Pulmonary effort is normal.     Breath sounds: Normal breath sounds.  Abdominal:     General: Abdomen is flat. Bowel sounds are normal.     Palpations: Abdomen is soft.  Musculoskeletal:        General: Normal range of motion.     Cervical back: Normal range of motion and neck supple.  Skin:    General: Skin is warm.     Capillary Refill: Capillary refill takes less than 2 seconds.  Neurological:      General: No focal deficit present.     Mental Status: She is alert. Mental status is at baseline.  Psychiatric:        Mood and Affect: Mood normal.        Behavior: Behavior normal.    ED Results / Procedures / Treatments   Labs (all labs ordered are listed, but only abnormal results are displayed) Labs Reviewed  CBC WITH DIFFERENTIAL/PLATELET - Abnormal; Notable for the following components:      Result Value   RBC 3.38 (*)    Hemoglobin 9.9 (*)    HCT 33.8 (*)    MCHC 29.3 (*)    All other components within normal limits  VALPROIC ACID LEVEL  COMPREHENSIVE METABOLIC PANEL  MAGNESIUM  URINALYSIS, ROUTINE W REFLEX MICROSCOPIC  CBG MONITORING, ED  I-STAT BETA HCG BLOOD, ED ([facility], [facility], [facility] ONLY)    EKG EKG Interpretation  Date/Time:  Monday April 01 2021 13:38:29 EST Ventricular Rate:  97 PR Interval:  130 QRS Duration: 79 QT Interval:  350 QTC Calculation: 445 R Axis:   10 Text Interpretation: Sinus rhythm Low voltage, precordial leads No significant change since last tracing Confirmed by Isla Pence [phone]) on 04/01/2021 1:41:31 PM  Radiology No results found.  Procedures Procedures   Medications Ordered in ED Medications  sodium chloride 0.9 % bolus 1,000 mL (1,000 mLs Intravenous New Bag/Given 04/01/21 1324)    And  0.9 %  sodium chloride infusion (has no administration in time range)    ED Course  I have reviewed the triage vital signs and the nursing notes.  Pertinent labs & imaging results that were available during my care of the patient were reviewed by me and considered in my medical decision making (see chart for details).    MDM Rules/Calculators/A&P                           Pt d/w Dr. Quinn Axe (neurology).  She will review pt's chart and get back to  me about any further recommendations.  Pt signed out to Dr. Maryan Rued at shift change.  Final Clinical Impression(s) / ED Diagnoses Final diagnoses:  Seizure-like activity [facility])    Rx / [place]  Orders ED Discharge Orders     None        Isla Pence, MD 04/01/21 1551

## 2021-04-01 NOTE — ED Notes (Signed)
Pt's caretaker requesting clozapine refill to get through holiday week. [place] MD notified.

## 2021-04-02 DIAGNOSIS — F209 Schizophrenia, unspecified: Secondary | ICD-10-CM | POA: Diagnosis not present

## 2021-04-02 DIAGNOSIS — Z79899 Other long term (current) drug therapy: Secondary | ICD-10-CM | POA: Diagnosis not present

## 2021-04-09 ENCOUNTER — Ambulatory Visit (INDEPENDENT_AMBULATORY_CARE_PROVIDER_SITE_OTHER): Payer: Medicare Other | Admitting: Family Medicine

## 2021-04-09 ENCOUNTER — Other Ambulatory Visit: Payer: Self-pay

## 2021-04-09 ENCOUNTER — Encounter: Payer: Self-pay | Admitting: Family Medicine

## 2021-04-09 DIAGNOSIS — R569 Unspecified convulsions: Secondary | ICD-10-CM | POA: Diagnosis not present

## 2021-04-09 DIAGNOSIS — F209 Schizophrenia, unspecified: Secondary | ICD-10-CM | POA: Diagnosis not present

## 2021-04-09 MED ORDER — EMPAGLIFLOZIN 10 MG PO TABS: 10.0000 mg | ORAL_TABLET | Freq: Every day | ORAL | 1 refills | Status: DC

## 2021-04-09 NOTE — Assessment & Plan Note (Signed)
Patient's recurrent symptoms of shaking in the hands and face, in the context of normal CT head, urine, and EEG during time in the hospital, could represent focal seizures. Also on the differential is psychogenic seizures, though it is difficult to determine exact cause given temporality of seizures and testing. Recommended continuing to follow up with neurology for further assessment and taking all seizure medications as prescribed. Will follow up here in the office in 3 months or sooner if needed.

## 2021-04-09 NOTE — Patient Instructions (Signed)
Start jardiance 29m daily Do not take this medication if she has stomach bug, can't eat/drink, feels dehydrated, or has signs of UTI or vaginal yeast infection  Follow up with neurologist as scheduled  Follow up with me in March/April, come sooner to see any doctor here if any issues  Be well, Dr. MArdelia Mems

## 2021-04-09 NOTE — Assessment & Plan Note (Signed)
Patient's BG have been higher since discharge. Given her continued medication and dietary habits, will need additional agent to control BG. Will prescribe jardiance 10 mg. Counseled on side effects of UTIs and dehydration and to stop medications if these conditions occur. Will follow up in 3 months and assess efficacy.

## 2021-04-09 NOTE — Progress Notes (Signed)
SUBJECTIVE:   CHIEF COMPLAINT / HPI:   [name] (MRN: [id number]) is a 45 y.o. female with a history of GERD, T2DM, CKD stage 3, and seizure-like activity who presents for hospital follow up.  Seizure-like activity Patient was hospitalized from 11/9 to 03/25/21 after experiencing seizure-like activity and COVID+. Per the discharge summary: "Patient from group home with witnessed seizure-like activity and postictal episode, given 5 mg of Versed IM by EMS. EEG showed no epileptiform changes, but mild diffuse encephalopathy.  Neurology gave patient one-time Valproic acid 1500 mg, then increased home Depakote to 1250 mg.  No further episodes during this admission." She subsequently went to the ED on 04/01/21 for similar activity. Dr. Quinn Axe recommended continuing her seizure medications and obtaining a head CT and urine sample, both of which were normal. She was discharged without incident.  Today, [name] is with her caregiver [name] who mentions an episode of similar activity at Thanksgiving. Michaelina states that she was "shaking" and that it was hard for her to walk during the episode. Her caregiver notes that when she asks [name] to stop shaking, she will stop the episode instantly. She did not lose consciousness during the episode, and she was alert and able to look around and speak. They have a follow up appointment with neurology on December 28th to follow up with this seizure-like activity.  T2DM Patient's caregiver states her blood glucose values have been high since discharge. One day after discharge, her BG was 220. Since that time, they have been higher in the 100s. They have not made drastic changes in diet or exercise habits.  OBJECTIVE:   BP 105/80   Pulse 80   Temp 97.6 F (36.4 C)   Ht 5' 5"  (1.651 m)   Wt 137 lb 6.4 oz (62.3 kg)   SpO2 99%   BMI 22.86 kg/m    PHYSICAL EXAM  GEN: Well-developed, in NAD HEAD: NCAT CVS: RRR, normal S1/S2, no murmurs, rubs, gallops RESP:  Breathing comfortably on RA, no retractions, wheezes, rhonchi, or crackles ABD: Non-distended SKIN: No obvious lesions or rashes  EXT: Moves all extremities grossly equally    ASSESSMENT/PLAN:   Seizure-like activity (HCC) Patient's recurrent symptoms of shaking in the hands and face, in the context of normal CT head, urine, and EEG during time in the hospital, could represent focal seizures. Also on the differential is psychogenic seizures, though it is difficult to determine exact cause given temporality of seizures and testing. Recommended continuing to follow up with neurology for further assessment and taking all seizure medications as prescribed. Will follow up here in the office in 3 months or sooner if needed.  Type 2 diabetes mellitus with renal manifestations ([place]) Patient's BG have been higher since discharge. Given her continued medication and dietary habits, will need additional agent to control BG. Will prescribe jardiance 10 mg. Counseled on side effects of UTIs and dehydration and to stop medications if these conditions occur. Will follow up in 3 months and assess efficacy.   Health maintenance - will request results from eye exam - Dr. Felipa Evener, [place]  Patient seen along with MS[employer] student [facility]. I personally evaluated this patient along with the student, and verified all aspects of the history, physical exam, and medical decision making as documented by the student. I agree with the student's documentation and have made all necessary edits.  Chrisandra Netters, MD  [place]

## 2021-04-10 ENCOUNTER — Other Ambulatory Visit: Payer: Self-pay | Admitting: Family Medicine

## 2021-04-30 DIAGNOSIS — F209 Schizophrenia, unspecified: Secondary | ICD-10-CM | POA: Diagnosis not present

## 2021-05-07 DIAGNOSIS — F209 Schizophrenia, unspecified: Secondary | ICD-10-CM | POA: Diagnosis not present

## 2021-05-08 ENCOUNTER — Ambulatory Visit (INDEPENDENT_AMBULATORY_CARE_PROVIDER_SITE_OTHER): Payer: Medicare Other | Admitting: Neurology

## 2021-05-08 ENCOUNTER — Encounter: Payer: Self-pay | Admitting: Neurology

## 2021-05-08 ENCOUNTER — Other Ambulatory Visit: Payer: Self-pay

## 2021-05-08 VITALS — BP 112/77 | HR 97 | Ht 65.0 in | Wt 136.0 lb

## 2021-05-08 DIAGNOSIS — R569 Unspecified convulsions: Secondary | ICD-10-CM | POA: Diagnosis not present

## 2021-05-08 DIAGNOSIS — F209 Schizophrenia, unspecified: Secondary | ICD-10-CM | POA: Diagnosis not present

## 2021-05-08 NOTE — Patient Instructions (Signed)
Continue current medications Follow up with your PCP and return if worse

## 2021-05-08 NOTE — Progress Notes (Signed)
[facility]  PATIENT: [name] DOB: 1975/11/20  REQUESTING CLINICIAN: Lorenza Chick, MD HISTORY FROM: Patient and Aid Faye  REASON FOR VISIT: Seizure like activity    HISTORICAL  CHIEF COMPLAINT:  Chief Complaint  Patient presents with   New Patient (Initial Visit)    Rm 13. Accompanied by therapeutic advisor. NP internal referral for Seizure-like activity.    HISTORY OF PRESENT ILLNESS:  This is a 45 year old woman with past medical history of schizophrenia, cognitive impairment, and diabetes mellitus who is presenting after being admitted to the hospital for seizure-like activity.  Patient was initially admitted from November 9 to November 14 after being witnessed to have seizure-like activity at her nursing home.  EMS was called and she was given 5 mg of Versed.  She was taken to the ED, EEG was negative for seizures but showed mild generalized slowing.  At that time she was also noted to be COVID-positive.  No other abnormal movement noted.  Her Depakote was increased to 1250.  Patient was discharged home then presented again to the ED on November 21 for seizure-like activity, again it was described as she was shaking, unresponsive, EMS was called and again she was given Versed 5 mg and sent to the ED.  In the ED, her Depakote level was checked and was therapeutic and she was discharged home without any increase in her antiseizure medication.  Since being back to the nursing home, aide reports that she does have occasional seizure-like activity, she provided a video when you see patient shaking her head and was able to stop it upon command.  Denies any urinary incontinence no injuries, no tongue biting.   Handedness: Right   Seizure Type: Described as shaking   Current frequency: weekly   Any injuries from seizures: None   Seizure risk factors: Schizophrenia, otherwise none reported   Previous ASMs: None   Currenty ASMs: Depakote but for mood    ASMs side effects: None   Brain Images: within normal limits   Previous EEGs: Mild generalized slowing, no seizures, no interictal discharges   OTHER MEDICAL CONDITIONS: Schizophrenia, cognitive impairment, HTN, DMII, HLD  REVIEW OF SYSTEMS: Full 14 system review of systems performed and negative with exception of: unable to obtain   ALLERGIES: Allergies  Allergen Reactions   Minocycline     Unknown     HOME MEDICATIONS: Outpatient Medications Prior to Visit  Medication Sig Dispense Refill   atorvastatin (LIPITOR) 20 MG tablet TAKE ONE TABLET EACH DAY (Patient taking differently: Take 20 mg by mouth at bedtime.) 90 tablet 3   benztropine (COGENTIN) 1 MG tablet Take 1 mg by mouth daily.     Blood Glucose Monitoring Suppl (ONETOUCH VERIO) w/Device KIT Check blood sugar once daily 1 kit 0   docusate sodium (COLACE) 100 MG capsule TAKE ONE CAPSULE EACH DAY 90 capsule 1   Emollient (CETAPHIL) cream APPLY DAILY (Patient taking differently: Apply 1 application topically daily.) 236 g 3   empagliflozin (JARDIANCE) 10 MG TABS tablet Take 1 tablet (10 mg total) by mouth daily. 90 tablet 1   famotidine (PEPCID) 20 MG tablet TAKE ONE TABLET TWICE DAILY (Patient taking differently: Take 20 mg by mouth 2 (two) times daily.) 180 tablet 3   GAS-X EXTRA STRENGTH 125 MG chewable tablet TAKE ONE TABLET DAILY AS NEEDED FOR GAS (Patient taking differently: Chew 125 mg by mouth daily as needed for flatulence.) 30 tablet 3   glipiZIDE (GLUCOTROL) 10 MG tablet TAKE  ONE TABLET TWICE DAILY BEFORE MEALS (Patient taking differently: Take 10 mg by mouth 2 (two) times daily before a meal.) 180 tablet 1   glucose blood (ONETOUCH VERIO) test strip Check blood sugar once daily 100 strip 3   GOODSENSE ASPIRIN 81 MG chewable tablet CHEW 1 TABLET DAILY (Patient taking differently: Chew 81 mg by mouth daily.) 90 tablet 3   haloperidol (HALDOL) 5 MG tablet Take 5 mg by mouth at bedtime.     Incontinence Supply  Disposable (INCONTINENCE BRIEF MEDIUM) MISC Wear every night 60 each 5   INGREZZA 40 MG capsule Take 40 mg by mouth at bedtime.     Lancet Devices (ONE TOUCH DELICA LANCING DEV) MISC Check blood sugar once daily 1 each 0   medroxyPROGESTERone (DEPO-PROVERA) 150 MG/ML injection Inject 150 mg into the muscle every 3 (three) months.     metoprolol succinate (TOPROL-XL) 25 MG 24 hr tablet TAKE ONE TABLET DAILY (Patient taking differently: Take 25 mg by mouth at bedtime.) 90 tablet 3   OneTouch Delica Lancets 25E MISC Check blood sugar once daily 100 each 3   PARoxetine (PAXIL) 10 MG tablet Take 10 mg by mouth daily.     polyethylene glycol powder (GLYCOLAX/MIRALAX) 17 GM/SCOOP powder TAKE 17G AT BEDTIME -CAN GO UPTO 3 TIMESDAILY IF NEEDED (Patient taking differently: Take 17 g by mouth at bedtime.) 510 g 3   QC PAIN RELIEF EXTRA STRENGTH 500 MG tablet TAKE ONE TABLET EVERY EIGHT HOURS AS NEEDED FOR MILD PAIN OR FEVER (Patient taking differently: Take 500 mg by mouth every 8 (eight) hours as needed for mild pain or fever.) 30 tablet 3   QUEtiapine (SEROQUEL) 25 MG tablet Take 25 mg by mouth at bedtime.     sitaGLIPtin (JANUVIA) 50 MG tablet Take 1 tablet (50 mg total) by mouth daily. 90 tablet 1   zolpidem (AMBIEN) 5 MG tablet Take 5 mg by mouth at bedtime as needed.     cloZAPine (CLOZARIL) 100 MG tablet Take 4 tablets (400 mg total) by mouth at bedtime for 7 days. 28 tablet 0   divalproex (DEPAKOTE ER) 250 MG 24 hr tablet Take 5 tablets (1,250 mg total) by mouth at bedtime. 150 tablet 0   No facility-administered medications prior to visit.    PAST MEDICAL HISTORY: Past Medical History:  Diagnosis Date   Acne    Anemia    Cognitive impairment    Diabetes mellitus without complication (HCC)    Internal hemorrhoid, bleeding 10/22/2010   Obesity    Schizoaffective disorder    SVT (supraventricular tachycardia) (HCC)    with short PR interval - followed by Dr. Lovena Le    PAST SURGICAL  HISTORY: Past Surgical History:  Procedure Laterality Date   BREAST REDUCTION SURGERY  08/10/2005   HYSTEROSCOPY  09/09/2004   w/ removal of polyps   KIDNEY SURGERY Left 05/17/2020   Tumor removal by Dr. Dema Severin   REDUCTION MAMMAPLASTY Bilateral 2007   SPINE SURGERY N/A 04/18/2020   Dr. Arnoldo Morale - pinched nerve of Lumbar Spine    FAMILY HISTORY: Family History  Problem Relation Age of Onset   Schizophrenia Sister     SOCIAL HISTORY: Social History   Socioeconomic History   Marital status: Single    Spouse name: Not on file   Number of children: Not on file   Years of education: 10 years   Highest education level: 10th grade  Occupational History   Occupation: Disabled  Tobacco Use  Smoking status: Never   Smokeless tobacco: Never  Vaping Use   Vaping Use: Never used  Substance and Sexual Activity   Alcohol use: No   Drug use: No   Sexual activity: Never    Birth control/protection: Injection  Other Topics Concern   Not on file  Social History Narrative   Caregiver is Tax adviser. Lives in [facility] in [place], [phone]. Previously lived in group home. Twin sister with similar problem list. Has 24H care. Case manager is Justin Mend, [phone].      Patient recently started going to [employer]. Phone: [phone]. [address] [address]      Caregiver works for [employer], [address] [place], [place] [address].       Guardian is Delaney Meigs of [employer] [phone] office. After hours service [phone]   Social Determinants of Health   Financial Resource Strain: Low Risk    Difficulty of Paying Living Expenses: Not hard at all  Food Insecurity: No Food Insecurity   Worried About Charity fundraiser in the Last Year: Never true   Arboriculturist in the Last Year: Never true  Transportation Needs: No Transportation Needs   Lack of Transportation (Medical): No   Lack of  Transportation (Non-Medical): No  Physical Activity: Insufficiently Active   Days of Exercise per Week: 5 days   Minutes of Exercise per Session: 10 min  Stress: Stress Concern Present   Feeling of Stress : To some extent  Social Connections: Socially Isolated   Frequency of Communication with Friends and Family: Once a week   Frequency of Social Gatherings with Friends and Family: Once a week   Attends Religious Services: 1 to 4 times per year   Active Member of [employer] or Organizations: No   Attends Archivist Meetings: Never   Marital Status: Never married  Human resources officer Violence: Not At Risk   Fear of Current or Ex-Partner: No   Emotionally Abused: No   Physically Abused: No   Sexually Abused: No     PHYSICAL EXAM  GENERAL EXAM/CONSTITUTIONAL: Vitals:  Vitals:   05/08/21 0905  BP: 112/77  Pulse: 97  Weight: 136 lb (61.7 kg)  Height: 5' 5"  (1.651 m)   Body mass index is 22.63 kg/m. Wt Readings from Last 3 Encounters:  05/08/21 136 lb (61.7 kg)  04/09/21 137 lb 6.4 oz (62.3 kg)  03/20/21 145 lb (65.8 kg)   Patient is in no distress; well developed, nourished and groomed; neck is supple  CARDIOVASCULAR: Examination of carotid arteries is normal; no carotid bruits Regular rate and rhythm, no murmurs Examination of peripheral vascular system by observation and palpation is normal  EYES: Pupils round and reactive to light, Visual fields full to confrontation, Extraocular movements intacts,  No results found.  MUSCULOSKELETAL: Gait, strength, tone, movements noted in Neurologic exam below  NEUROLOGIC: MENTAL STATUS:  No flowsheet data found. awake, alert, oriented to person, place and time Able to follow simple commands   CRANIAL NERVE:  2nd, 3rd, 4th, 6th - pupils equal and reactive to light, visual fields full to confrontation, extraocular muscles intact, no nystagmus 5th - facial sensation symmetric 7th - facial strength symmetric 8th -  hearing intact 9th - palate elevates symmetrically, uvula midline 11th - shoulder shrug symmetric 12th - tongue protrusion midline  MOTOR:  normal bulk and tone, full strength in the BUE, BLE  SENSORY:  normal and  symmetric to light touch, pinprick, temperature, vibration  COORDINATION:  finger-nose-finger, fine finger movements normal  REFLEXES:  deep tendon reflexes present and symmetric  GAIT/STATION:  normal    DIAGNOSTIC DATA (LABS, IMAGING, TESTING) - I reviewed patient records, labs, notes, testing and imaging myself where available.  Lab Results  Component Value Date   WBC 6.3 04/01/2021   HGB 9.9 (L) 04/01/2021   HCT 33.8 (L) 04/01/2021   MCV 100.0 04/01/2021   PLT 181 04/01/2021      Component Value Date/Time   NA 143 04/01/2021 1335   NA 142 07/31/2020 1139   K 4.6 04/01/2021 1335   CL 115 (H) 04/01/2021 1335   CO2 19 (L) 04/01/2021 1335   GLUCOSE 180 (H) 04/01/2021 1335   BUN 22 (H) 04/01/2021 1335   BUN 15 07/31/2020 1139   CREATININE 1.77 (H) 04/01/2021 1335   CREATININE 1.79 (H) 06/23/2016 1025   CALCIUM 8.5 (L) 04/01/2021 1335   PROT 5.3 (L) 04/01/2021 1335   PROT 6.6 02/28/2020 1207   ALBUMIN 2.6 (L) 04/01/2021 1335   ALBUMIN 4.0 02/28/2020 1207   AST 18 04/01/2021 1335   ALT 15 04/01/2021 1335   ALKPHOS 71 04/01/2021 1335   BILITOT 0.3 04/01/2021 1335   BILITOT 0.3 02/28/2020 1207   GFRNONAA 35 (L) 04/01/2021 1335   GFRNONAA 34 (L) 06/23/2016 1025   GFRAA 47 (L) 02/28/2020 1207   GFRAA 40 (L) 06/23/2016 1025   Lab Results  Component Value Date   CHOL 90 (L) 05/16/2020   HDL 34 (L) 05/16/2020   LDLCALC 40 05/16/2020   LDLDIRECT 60 04/02/2012   TRIG 78 05/16/2020   Lab Results  Component Value Date   HGBA1C 8.1 (H) 03/21/2021   No results found for: VITAMINB12 Lab Results  Component Value Date   TSH 0.668 04/18/2008    Depakote level 11/21: 41   MRI Brain with and without contrast 03/20/2021 No acute intracranial  process.  No evidence of brain mass or lesion   EEG 03/21/2021: This study is suggestive of mild diffuse encephalopathy, nonspecific etiology. No seizures or epileptiform discharges were seen throughout the recording.  I personally reviewed brain Images and previous EEG reports.   ASSESSMENT AND PLAN  45 y.o. year old female  with past medical history of schizophrenia, cognitive impairment, hypertension, hyperlipidemia, and diabetes mellitus who is presenting with seizure-like activity.  Patient was noted by nursing home staff to have 2 episodes of shaking, EMS was called and she was taken to the hospital.  During her first hospitalization, she had a brain MRI and EEG which was unrevealing, Depakote was increased to 1250 mg.  Nurse aide [name] showed me a video of the patient and based on the video, these  movements are less likely to be epileptic seizure, most likely behavioral as patient was also noted to stop on command.  As of now, we will continue the same medication, advised them to continue to monitor and call us if worse.  Follow-up with your PCP    1. Seizure-like activity ([place])   2. Schizophrenia, unspecified type [facility])      Patient Instructions  Continue current medications Follow up with your PCP and return if worse    Per [facility] statutes, patients with seizures are not allowed to drive until they have been seizure-free for six months.  Other recommendations include using caution when using heavy equipment or power tools. Avoid working on ladders or at heights. Take showers instead of  baths.  Do not swim alone.  Ensure the water temperature is not too high on the home water heater. Do not go swimming alone. Do not lock yourself in a room alone (i.e. bathroom). When caring for infants or small children, sit down when holding, feeding, or changing them to minimize risk of injury to the child in the event you have a seizure. Maintain good sleep hygiene. Avoid alcohol.  Also  recommend adequate sleep, hydration, good diet and minimize stress.   During the Seizure  - First, ensure adequate ventilation and place patients on the floor on their left side  Loosen clothing around the neck and ensure the airway is patent. If the patient is clenching the teeth, do not force the mouth open with any object as this can cause severe damage - Remove all items from the surrounding that can be hazardous. The patient may be oblivious to what's happening and may not even know what he or she is doing. If the patient is confused and wandering, either gently guide him/her away and block access to outside areas - Reassure the individual and be comforting - Call 911. In most cases, the seizure ends before EMS arrives. However, there are cases when seizures may last over 3 to 5 minutes. Or the individual may have developed breathing difficulties or severe injuries. If a pregnant patient or a person with diabetes develops a seizure, it is prudent to call an ambulance. - Finally, if the patient does not regain full consciousness, then call EMS. Most patients will remain confused for about 45 to 90 minutes after a seizure, so you must use judgment in calling for help. - Avoid restraints but make sure the patient is in a bed with padded side rails - Place the individual in a lateral position with the neck slightly flexed; this will help the saliva drain from the mouth and prevent the tongue from falling backward - Remove all nearby furniture and other hazards from the area - Provide verbal assurance as the individual is regaining consciousness - Provide the patient with privacy if possible - Call for help and start treatment as ordered by the caregiver   After the Seizure (Postictal Stage)  After a seizure, most patients experience confusion, fatigue, muscle pain and/or a headache. Thus, one should permit the individual to sleep. For the next few days, reassurance is essential. Being calm and  helping reorient the person is also of importance.  Most seizures are painless and end spontaneously. Seizures are not harmful to others but can lead to complications such as stress on the lungs, brain and the heart. Individuals with prior lung problems may develop labored breathing and respiratory distress.     No orders of the defined types were placed in this encounter.   No orders of the defined types were placed in this encounter.   Return if symptoms worsen or fail to improve.    Alric Ran, MD 05/08/2021, 4:27 PM  [facility] [address], [place], [place] [address] [phone]

## 2021-05-16 ENCOUNTER — Other Ambulatory Visit: Payer: Self-pay

## 2021-05-16 ENCOUNTER — Ambulatory Visit (INDEPENDENT_AMBULATORY_CARE_PROVIDER_SITE_OTHER): Payer: Medicare Other

## 2021-05-16 DIAGNOSIS — Z3042 Encounter for surveillance of injectable contraceptive: Secondary | ICD-10-CM

## 2021-05-16 MED ORDER — MEDROXYPROGESTERONE ACETATE 150 MG/ML IM SUSP
150.0000 mg | Freq: Once | INTRAMUSCULAR | Status: AC
  Administered 2021-05-16: 150 mg via INTRAMUSCULAR

## 2021-05-16 NOTE — Progress Notes (Signed)
Patient here today for Depo Provera injection and is within her dates.    Last contraceptive appt was 02/26/2021.  Depo given in [place] today. Site unremarkable & patient tolerated injection.    Next injection due 08/01/2021-08/15/2021.  Reminder card given.

## 2021-05-28 DIAGNOSIS — F250 Schizoaffective disorder, bipolar type: Secondary | ICD-10-CM | POA: Diagnosis not present

## 2021-05-28 DIAGNOSIS — G2401 Drug induced subacute dyskinesia: Secondary | ICD-10-CM | POA: Diagnosis not present

## 2021-05-28 DIAGNOSIS — F70 Mild intellectual disabilities: Secondary | ICD-10-CM | POA: Diagnosis not present

## 2021-06-05 ENCOUNTER — Other Ambulatory Visit: Payer: Self-pay

## 2021-06-05 ENCOUNTER — Encounter: Payer: Self-pay | Admitting: Family Medicine

## 2021-06-05 ENCOUNTER — Ambulatory Visit (INDEPENDENT_AMBULATORY_CARE_PROVIDER_SITE_OTHER): Payer: Medicare Other | Admitting: Family Medicine

## 2021-06-05 VITALS — BP 110/81 | HR 104 | Ht 65.0 in | Wt 134.6 lb

## 2021-06-05 DIAGNOSIS — M549 Dorsalgia, unspecified: Secondary | ICD-10-CM

## 2021-06-05 DIAGNOSIS — N3000 Acute cystitis without hematuria: Secondary | ICD-10-CM | POA: Diagnosis not present

## 2021-06-05 DIAGNOSIS — N390 Urinary tract infection, site not specified: Secondary | ICD-10-CM | POA: Insufficient documentation

## 2021-06-05 LAB — POCT URINALYSIS DIP (MANUAL ENTRY)
Bilirubin, UA: NEGATIVE
Glucose, UA: 500 mg/dL — AB
Ketones, POC UA: NEGATIVE mg/dL
Nitrite, UA: NEGATIVE
Protein Ur, POC: NEGATIVE mg/dL
Spec Grav, UA: <=1.005 — AB (ref 1.010–1.025)
Urobilinogen, UA: 0.2 E.U./dL
pH, UA: 5.5 (ref 5.0–8.0)

## 2021-06-05 LAB — POCT UA - MICROSCOPIC ONLY

## 2021-06-05 MED ORDER — CEPHALEXIN 500 MG PO CAPS: 500.0000 mg | ORAL_CAPSULE | Freq: Four times a day (QID) | ORAL | 0 refills | Status: DC

## 2021-06-05 NOTE — Patient Instructions (Signed)
I think she does have a urinary infection. I sent in an antibiotic to [facility].  I will call Friday if the culture shows any surprise.

## 2021-06-06 ENCOUNTER — Encounter: Payer: Self-pay | Admitting: Family Medicine

## 2021-06-06 DIAGNOSIS — G2401 Drug induced subacute dyskinesia: Secondary | ICD-10-CM | POA: Diagnosis not present

## 2021-06-06 DIAGNOSIS — F70 Mild intellectual disabilities: Secondary | ICD-10-CM | POA: Diagnosis not present

## 2021-06-06 DIAGNOSIS — E889 Metabolic disorder, unspecified: Secondary | ICD-10-CM | POA: Diagnosis not present

## 2021-06-06 DIAGNOSIS — F250 Schizoaffective disorder, bipolar type: Secondary | ICD-10-CM | POA: Diagnosis not present

## 2021-06-06 NOTE — Progress Notes (Signed)
° ° °  SUBJECTIVE:   CHIEF COMPLAINT / HPI:   Behavior change.  [name] is a special needs patient who has a very limited ability to communicate.  Brought in by caregiver because her behavior is a bit off.  She is sitting in a manner that takes weight off her left hip.  [name] complains of pain in that region.  It is difficult to pinpoint the pain which in the region of the perineum, lower abd and left hip.   No fever, frequency or change in urination per care giver Reviewing records, in 2019 had left hip xrays which implies she was having pain issues around that hip at that time.    OBJECTIVE:   BP 110/81    Pulse (!) 104    Ht 5' 5"  (1.651 m)    Wt 134 lb 9.6 oz (61.1 kg)    SpO2 100%    BMI 22.40 kg/m   VS noted Gen: non toxic appearance Lungs clear Cardiac RRR without m or g Abd benign: Caveat, limited exam. Gait normal.   ASSESSMENT/PLAN:   UTI (urinary tract infection) UA and micro suggestive of UTI.  Causality difficult.  Will obtain culture for further objective info and treat.  May need further workup depending on her response to the treatment.  I am not convinced that I have fully defined the clinical problem.     Zenia Resides, MD [place]

## 2021-06-06 NOTE — Assessment & Plan Note (Signed)
UA and micro suggestive of UTI.  Causality difficult.  Will obtain culture for further objective info and treat.  May need further workup depending on her response to the treatment.  I am not convinced that I have fully defined the clinical problem.

## 2021-06-07 ENCOUNTER — Telehealth: Payer: Self-pay | Admitting: Family Medicine

## 2021-06-07 LAB — URINE CULTURE

## 2021-06-07 NOTE — Telephone Encounter (Signed)
Called and informed caregiver that the culture was negative - no UTI.  Stop antibiotics.  If discomfort continues, return to [facility] for reevaluation.

## 2021-06-10 ENCOUNTER — Other Ambulatory Visit: Payer: Self-pay | Admitting: Family Medicine

## 2021-06-10 DIAGNOSIS — E1122 Type 2 diabetes mellitus with diabetic chronic kidney disease: Secondary | ICD-10-CM

## 2021-06-17 ENCOUNTER — Other Ambulatory Visit: Payer: Self-pay | Admitting: Family Medicine

## 2021-06-20 ENCOUNTER — Other Ambulatory Visit: Payer: Self-pay | Admitting: Family Medicine

## 2021-06-24 DIAGNOSIS — C642 Malignant neoplasm of left kidney, except renal pelvis: Secondary | ICD-10-CM | POA: Diagnosis not present

## 2021-06-24 DIAGNOSIS — Z85528 Personal history of other malignant neoplasm of kidney: Secondary | ICD-10-CM | POA: Diagnosis not present

## 2021-06-24 DIAGNOSIS — C649 Malignant neoplasm of unspecified kidney, except renal pelvis: Secondary | ICD-10-CM | POA: Diagnosis not present

## 2021-06-24 DIAGNOSIS — R351 Nocturia: Secondary | ICD-10-CM | POA: Diagnosis not present

## 2021-06-25 DIAGNOSIS — G2401 Drug induced subacute dyskinesia: Secondary | ICD-10-CM | POA: Diagnosis not present

## 2021-06-25 DIAGNOSIS — F70 Mild intellectual disabilities: Secondary | ICD-10-CM | POA: Diagnosis not present

## 2021-06-25 DIAGNOSIS — F250 Schizoaffective disorder, bipolar type: Secondary | ICD-10-CM | POA: Diagnosis not present

## 2021-06-27 DIAGNOSIS — F70 Mild intellectual disabilities: Secondary | ICD-10-CM | POA: Diagnosis not present

## 2021-06-27 DIAGNOSIS — F250 Schizoaffective disorder, bipolar type: Secondary | ICD-10-CM | POA: Diagnosis not present

## 2021-06-27 DIAGNOSIS — G2401 Drug induced subacute dyskinesia: Secondary | ICD-10-CM | POA: Diagnosis not present

## 2021-07-03 ENCOUNTER — Other Ambulatory Visit: Payer: Self-pay

## 2021-07-03 ENCOUNTER — Encounter (HOSPITAL_COMMUNITY): Payer: Self-pay | Admitting: Emergency Medicine

## 2021-07-03 ENCOUNTER — Emergency Department (HOSPITAL_COMMUNITY)
Admission: EM | Admit: 2021-07-03 | Discharge: 2021-07-04 | Disposition: A | Discharge status: Home / Self Care | Payer: Medicare Other | Attending: Emergency Medicine | Admitting: Emergency Medicine

## 2021-07-03 DIAGNOSIS — R000 Tachycardia, unspecified: Secondary | ICD-10-CM | POA: Diagnosis not present

## 2021-07-03 DIAGNOSIS — G40909 Epilepsy, unspecified, not intractable, without status epilepticus: Secondary | ICD-10-CM | POA: Diagnosis not present

## 2021-07-03 DIAGNOSIS — R569 Unspecified convulsions: Secondary | ICD-10-CM | POA: Insufficient documentation

## 2021-07-03 DIAGNOSIS — N9489 Other specified conditions associated with female genital organs and menstrual cycle: Secondary | ICD-10-CM | POA: Diagnosis not present

## 2021-07-03 DIAGNOSIS — R251 Tremor, unspecified: Secondary | ICD-10-CM | POA: Diagnosis not present

## 2021-07-03 DIAGNOSIS — Z20822 Contact with and (suspected) exposure to covid-19: Secondary | ICD-10-CM | POA: Insufficient documentation

## 2021-07-03 LAB — CBC WITH DIFFERENTIAL/PLATELET
Abs Immature Granulocytes: 0.05 10*3/uL (ref 0.00–0.07)
Basophils Absolute: 0 10*3/uL (ref 0.0–0.1)
Basophils Relative: 1 %
Eosinophils Absolute: 0.2 10*3/uL (ref 0.0–0.5)
Eosinophils Relative: 3 %
HCT: 39 % (ref 36.0–46.0)
Hemoglobin: 12.3 g/dL (ref 12.0–15.0)
Immature Granulocytes: 1 %
Lymphocytes Relative: 35 %
Lymphs Abs: 2 10*3/uL (ref 0.7–4.0)
MCH: 30.2 pg (ref 26.0–34.0)
MCHC: 31.5 g/dL (ref 30.0–36.0)
MCV: 95.8 fL (ref 80.0–100.0)
Monocytes Absolute: 0.8 10*3/uL (ref 0.1–1.0)
Monocytes Relative: 13 %
Neutro Abs: 2.7 10*3/uL (ref 1.7–7.7)
Neutrophils Relative %: 47 %
Platelets: UNDETERMINED 10*3/uL (ref 150–400)
RBC: 4.07 MIL/uL (ref 3.87–5.11)
RDW: 13.4 % (ref 11.5–15.5)
WBC: 5.6 10*3/uL (ref 4.0–10.5)
nRBC: 0 % (ref 0.0–0.2)

## 2021-07-03 LAB — VALPROIC ACID LEVEL: Valproic Acid Lvl: 66 ug/mL (ref 50.0–100.0)

## 2021-07-03 LAB — HCG, QUANTITATIVE, PREGNANCY: hCG, Beta Chain, Quant, S: 1 m[IU]/mL (ref <5)

## 2021-07-03 LAB — URINALYSIS, ROUTINE W REFLEX MICROSCOPIC
Bacteria, UA: NONE SEEN
Bilirubin Urine: NEGATIVE
Glucose, UA: >=500 mg/dL — AB
Hgb urine dipstick: NEGATIVE
Ketones, ur: NEGATIVE mg/dL
Leukocytes,Ua: NEGATIVE
Nitrite: NEGATIVE
Protein, ur: NEGATIVE mg/dL
Specific Gravity, Urine: 1.003 — ABNORMAL LOW (ref 1.005–1.030)
pH: 6 (ref 5.0–8.0)

## 2021-07-03 LAB — COMPREHENSIVE METABOLIC PANEL
ALT: 27 U/L (ref 0–44)
AST: 40 U/L (ref 15–41)
Albumin: 3.2 g/dL — ABNORMAL LOW (ref 3.5–5.0)
Alkaline Phosphatase: 63 U/L (ref 38–126)
Anion gap: 8 (ref 5–15)
BUN: 29 mg/dL — ABNORMAL HIGH (ref 6–20)
CO2: 20 mmol/L — ABNORMAL LOW (ref 22–32)
Calcium: 8.6 mg/dL — ABNORMAL LOW (ref 8.9–10.3)
Chloride: 109 mmol/L (ref 98–111)
Creatinine, Ser: 1.95 mg/dL — ABNORMAL HIGH (ref 0.44–1.00)
GFR, Estimated: 31 mL/min — ABNORMAL LOW (ref >60)
Glucose, Bld: 119 mg/dL — ABNORMAL HIGH (ref 70–99)
Potassium: 5.2 mmol/L — ABNORMAL HIGH (ref 3.5–5.1)
Sodium: 137 mmol/L (ref 135–145)
Total Bilirubin: 1.3 mg/dL — ABNORMAL HIGH (ref 0.3–1.2)
Total Protein: 6.4 g/dL — ABNORMAL LOW (ref 6.5–8.1)

## 2021-07-03 LAB — CBG MONITORING, ED: Glucose-Capillary: 108 mg/dL — ABNORMAL HIGH (ref 70–99)

## 2021-07-03 LAB — MAGNESIUM: Magnesium: 2.6 mg/dL — ABNORMAL HIGH (ref 1.7–2.4)

## 2021-07-03 LAB — RESP PANEL BY RT-PCR (FLU A&B, COVID) ARPGX2
Influenza A by PCR: NEGATIVE
Influenza B by PCR: NEGATIVE
SARS Coronavirus 2 by RT PCR: NEGATIVE

## 2021-07-03 MED ORDER — ATORVASTATIN CALCIUM 10 MG PO TABS
20.0000 mg | ORAL_TABLET | Freq: Once | ORAL | Status: AC
  Administered 2021-07-03: 20 mg via ORAL
  Filled 2021-07-03: qty 2

## 2021-07-03 MED ORDER — PAROXETINE HCL 10 MG PO TABS
10.0000 mg | ORAL_TABLET | Freq: Once | ORAL | Status: AC
  Administered 2021-07-03: 10 mg via ORAL
  Filled 2021-07-03: qty 1

## 2021-07-03 MED ORDER — CLOZAPINE 100 MG PO TABS
400.0000 mg | ORAL_TABLET | Freq: Once | ORAL | Status: AC
  Administered 2021-07-03: 400 mg via ORAL
  Filled 2021-07-03: qty 4

## 2021-07-03 MED ORDER — VALBENAZINE TOSYLATE 40 MG PO CAPS
80.0000 mg | ORAL_CAPSULE | Freq: Once | ORAL | Status: AC
  Administered 2021-07-03: 80 mg via ORAL
  Filled 2021-07-03: qty 2

## 2021-07-03 MED ORDER — QUETIAPINE FUMARATE 50 MG PO TABS
25.0000 mg | ORAL_TABLET | Freq: Once | ORAL | Status: AC
  Administered 2021-07-03: 25 mg via ORAL
  Filled 2021-07-03: qty 1

## 2021-07-03 MED ORDER — SODIUM CHLORIDE 0.9 % IV BOLUS
500.0000 mL | Freq: Once | INTRAVENOUS | Status: AC
  Administered 2021-07-03: 500 mL via INTRAVENOUS

## 2021-07-03 MED ORDER — EMPAGLIFLOZIN 10 MG PO TABS
10.0000 mg | ORAL_TABLET | Freq: Once | ORAL | Status: AC
  Administered 2021-07-03: 10 mg via ORAL
  Filled 2021-07-03: qty 1

## 2021-07-03 MED ORDER — METOPROLOL SUCCINATE ER 50 MG PO TB24
25.0000 mg | ORAL_TABLET | Freq: Once | ORAL | Status: AC
  Administered 2021-07-03: 25 mg via ORAL
  Filled 2021-07-03: qty 1

## 2021-07-03 MED ORDER — ZOLPIDEM TARTRATE 5 MG PO TABS
5.0000 mg | ORAL_TABLET | Freq: Once | ORAL | Status: AC
  Administered 2021-07-03: 5 mg via ORAL
  Filled 2021-07-03: qty 1

## 2021-07-03 MED ORDER — DIVALPROEX SODIUM ER 500 MG PO TB24
1250.0000 mg | ORAL_TABLET | Freq: Once | ORAL | Status: AC
Start: 2021-07-03 — End: 2021-07-03
  Administered 2021-07-03: 1250 mg via ORAL
  Filled 2021-07-03: qty 1

## 2021-07-03 MED ORDER — FAMOTIDINE 20 MG PO TABS
20.0000 mg | ORAL_TABLET | Freq: Once | ORAL | Status: AC
  Administered 2021-07-03: 20 mg via ORAL
  Filled 2021-07-03: qty 1

## 2021-07-03 MED ORDER — SODIUM ZIRCONIUM CYCLOSILICATE 10 G PO PACK
10.0000 g | PACK | Freq: Once | ORAL | Status: AC
  Administered 2021-07-03: 10 g via ORAL
  Filled 2021-07-03: qty 1

## 2021-07-03 MED ORDER — DOCUSATE SODIUM 100 MG PO CAPS
100.0000 mg | ORAL_CAPSULE | Freq: Once | ORAL | Status: AC
  Administered 2021-07-03: 100 mg via ORAL
  Filled 2021-07-03: qty 1

## 2021-07-03 NOTE — ED Triage Notes (Signed)
Patient BIB [facility] for evaluation after a witnessed seizure.  Pt has recently had Keppra dose adjusted.  Seizure lasted "about two minutes and looked like her normal seizures."  Patient at baseline mental status on arrival

## 2021-07-03 NOTE — ED Notes (Signed)
Attempted IV x2 without success  

## 2021-07-03 NOTE — ED Provider Notes (Signed)
[facility] DEPT Provider Note   CSN: [id number] Arrival date & time: 07/03/21  3875  LEVEL 5 CAVEAT - COGNITIVE DELAY  History  Chief Complaint  Patient presents with   Seizures    [name] is a 46 y.o. female.  HPI 46 year old female presents with a seizure. History is initially from staff who spoke to EMS, as EMS is no longer present. Patient has a history of cognitive delay and can't provide much history.  She tells me that she had a seizure today.  She does not remember what happened.  She denies any pain including no headache.  Unable to provide much history otherwise.  Sometime after arrival, patient's group home caretaker presented and was able to provide further history.  She was able to tell me that the patient was about to take her afternoon meds when she fell backwards into the wall and slid down to the ground and had generalized shaking consistent with her prior seizures.  She did not seem to wake up right away and took a while to get back to normal.  She does seem to be back at her baseline now.  There was no head trauma.  No recent medication changes or missed medicines besides tonight.  Home Medications Prior to Admission medications   Medication Sig Start Date End Date Taking? Authorizing Provider  atorvastatin (LIPITOR) 20 MG tablet TAKE ONE TABLET EACH DAY Patient taking differently: Take 20 mg by mouth at bedtime. 07/29/20   Leeanne Rio, MD  benztropine (COGENTIN) 1 MG tablet Take 1 mg by mouth daily.    [provider]  Blood Glucose Monitoring Suppl [facility] VERIO) w/Device KIT Check blood sugar once daily 03/27/20   Leeanne Rio, MD  cephALEXin (KEFLEX) 500 MG capsule Take 1 capsule (500 mg total) by mouth 4 (four) times daily. 06/05/21   Zenia Resides, MD  cloZAPine (CLOZARIL) 100 MG tablet Take 4 tablets (400 mg total) by mouth at bedtime for 7 days. 04/01/21 04/08/21  Blanchie Dessert, MD   divalproex (DEPAKOTE ER) 250 MG 24 hr tablet Take 5 tablets (1,250 mg total) by mouth at bedtime. 03/25/21 04/24/21  Holley Bouche, MD  docusate sodium (COLACE) 100 MG capsule TAKE ONE CAPSULE EACH DAY 04/11/21   Leeanne Rio, MD  Emollient (CETAPHIL) cream Apply 1 application topically daily. 06/21/21   Lenoria Chime, MD  empagliflozin (JARDIANCE) 10 MG TABS tablet Take 1 tablet (10 mg total) by mouth daily. 04/09/21   Leeanne Rio, MD  famotidine (PEPCID) 20 MG tablet TAKE ONE TABLET TWICE DAILY Patient taking differently: Take 20 mg by mouth 2 (two) times daily. 08/26/20   Leeanne Rio, MD  GAS-X EXTRA STRENGTH 125 MG chewable tablet TAKE ONE TABLET DAILY AS NEEDED FOR GAS Patient taking differently: Chew 125 mg by mouth daily as needed for flatulence. 06/12/15   Leeanne Rio, MD  glipiZIDE (GLUCOTROL) 10 MG tablet TAKE ONE TABLET TWICE DAILY BEFORE MEALS 06/17/21   Lenoria Chime, MD  glucose blood (ONETOUCH VERIO) test strip CHECK BLOOD GLUCOSE (SUGAR) EVERY DAY 06/10/21   Hensel, Jamal Collin, MD  GOODSENSE ASPIRIN 81 MG chewable tablet CHEW 1 TABLET DAILY Patient taking differently: Chew 81 mg by mouth daily. 03/26/20   Leeanne Rio, MD  haloperidol (HALDOL) 5 MG tablet Take 5 mg by mouth at bedtime.    [provider]  Incontinence Supply Disposable (INCONTINENCE BRIEF MEDIUM) MISC Wear every night 05/27/19  Leeanne Rio, MD  INGREZZA 40 MG capsule Take 40 mg by mouth at bedtime. 02/28/21   [provider]  Lancet Devices (ONE TOUCH DELICA LANCING DEV) MISC Check blood sugar once daily 03/27/20   Leeanne Rio, MD  medroxyPROGESTERone (DEPO-PROVERA) 150 MG/ML injection Inject 150 mg into the muscle every 3 (three) months.    [provider]  metoprolol succinate (TOPROL-XL) 25 MG 24 hr tablet TAKE ONE TABLET DAILY 06/10/21   Zenia Resides, MD  OneTouch Delica Lancets 55D MISC Check blood sugar once daily  03/27/20   Leeanne Rio, MD  PARoxetine (PAXIL) 10 MG tablet Take 10 mg by mouth daily.    [provider]  polyethylene glycol powder (GLYCOLAX/MIRALAX) 17 GM/SCOOP powder TAKE 17G AT BEDTIME -CAN GO UPTO 3 TIMESDAILY IF NEEDED Patient taking differently: Take 17 g by mouth at bedtime. 11/29/20   Leeanne Rio, MD  QC PAIN RELIEF EXTRA STRENGTH 500 MG tablet TAKE ONE TABLET EVERY EIGHT HOURS AS NEEDED FOR MILD PAIN OR FEVER Patient taking differently: Take 500 mg by mouth every 8 (eight) hours as needed for mild pain or fever. 01/02/20   Leeanne Rio, MD  QUEtiapine (SEROQUEL) 25 MG tablet Take 25 mg by mouth at bedtime. 12/11/20   [provider]  sitaGLIPtin (JANUVIA) 50 MG tablet Take 1 tablet (50 mg total) by mouth daily. 03/27/21   Leeanne Rio, MD  zolpidem (AMBIEN) 5 MG tablet Take 5 mg by mouth at bedtime as needed. 12/11/20   [provider]      Allergies    Minocycline    Review of Systems   Review of Systems  Unable to perform ROS: Other   Physical Exam Updated Vital Signs BP 121/87    Pulse 88    Temp 98.7 F (37.1 C)    Resp 20    Ht 5' 2"  (1.575 m)    Wt 61.7 kg    SpO2 100%    BMI 24.87 kg/m  Physical Exam Vitals and nursing note reviewed.  Constitutional:      General: She is not in acute distress.    Appearance: She is well-developed. She is not ill-appearing or diaphoretic.  HENT:     Head: Normocephalic and atraumatic.  Eyes:     Pupils: Pupils are equal, round, and reactive to light.  Cardiovascular:     Rate and Rhythm: Normal rate and regular rhythm.     Heart sounds: Normal heart sounds.  Pulmonary:     Effort: Pulmonary effort is normal.     Breath sounds: Normal breath sounds.  Abdominal:     Palpations: Abdomen is soft.     Tenderness: There is no abdominal tenderness.  Musculoskeletal:     Cervical back: Normal range of motion and neck supple. No rigidity.  Skin:    General: Skin is warm and  dry.  Neurological:     Mental Status: She is alert.     Comments: Awake, alert, disoriented. Equal strength in all 4 extremities.    ED Results / Procedures / Treatments   Labs (all labs ordered are listed, but only abnormal results are displayed) Labs Reviewed  COMPREHENSIVE METABOLIC PANEL - Abnormal; Notable for the following components:      Result Value   Potassium 5.2 (*)    CO2 20 (*)    Glucose, Bld 119 (*)    BUN 29 (*)    Creatinine, Ser 1.95 (*)  Calcium 8.6 (*)    Total Protein 6.4 (*)    Albumin 3.2 (*)    Total Bilirubin 1.3 (*)    GFR, Estimated 31 (*)    All other components within normal limits  MAGNESIUM - Abnormal; Notable for the following components:   Magnesium 2.6 (*)    All other components within normal limits  URINALYSIS, ROUTINE W REFLEX MICROSCOPIC - Abnormal; Notable for the following components:   Color, Urine STRAW (*)    Specific Gravity, Urine 1.003 (*)    Glucose, UA >=500 (*)    All other components within normal limits  CBG MONITORING, ED - Abnormal; Notable for the following components:   Glucose-Capillary 108 (*)    All other components within normal limits  RESP PANEL BY RT-PCR (FLU A&B, COVID) ARPGX2  CBC WITH DIFFERENTIAL/PLATELET  VALPROIC ACID LEVEL  HCG, QUANTITATIVE, PREGNANCY    EKG EKG Interpretation  Date/Time:  Wednesday July 03 2021 19:58:34 EST Ventricular Rate:  102 PR Interval:  125 QRS Duration: 77 QT Interval:  353 QTC Calculation: 460 R Axis:   11 Text Interpretation: Sinus tachycardia Low voltage, extremity and precordial leads similar to Nov 2022 Confirmed by Sherwood Gambler [phone]) on 07/03/2021 7:59:42 PM  Radiology No results found.  Procedures Procedures    Medications Ordered in ED Medications  famotidine (PEPCID) tablet 20 mg (20 mg Oral Given 07/03/21 2212)  PARoxetine (PAXIL) tablet 10 mg (10 mg Oral Given 07/03/21 2212)  cloZAPine (CLOZARIL) tablet 400 mg (400 mg Oral Given 07/03/21  2212)  docusate sodium (COLACE) capsule 100 mg (100 mg Oral Given 07/03/21 2212)  atorvastatin (LIPITOR) tablet 20 mg (20 mg Oral Given 07/03/21 2213)  zolpidem (AMBIEN) tablet 5 mg (5 mg Oral Given 07/03/21 2213)  metoprolol succinate (TOPROL-XL) 24 hr tablet 25 mg (25 mg Oral Given 07/03/21 2211)  divalproex (DEPAKOTE ER) 24 hr tablet 1,250 mg (1,250 mg Oral Given 07/03/21 2213)  QUEtiapine (SEROQUEL) tablet 25 mg (25 mg Oral Given 07/03/21 2212)  valbenazine (INGREZZA) capsule 80 mg (80 mg Oral Given 07/03/21 2213)  empagliflozin (JARDIANCE) tablet 10 mg (10 mg Oral Given 07/03/21 2212)  sodium zirconium cyclosilicate (LOKELMA) packet 10 g (10 g Oral Given 07/03/21 2210)  sodium chloride 0.9 % bolus 500 mL (500 mLs Intravenous New Bag/Given 07/03/21 2208)    ED Course/ Medical Decision Making/ A&P                           Medical Decision Making Amount and/or Complexity of Data Reviewed Labs: ordered.  Risk OTC drugs. Prescription drug management.   According to patient's group home personnel, there was no injury tonight.  Sounds like a recurrent history of potentially seizures.  Chart review shows that most recent neuro note indicates there might be concern for nonepileptic seizures as well but tonight's story was concerning for a possible epileptic event.  Either way she is on Depakote, and the lab work tonight indicates that is therapeutic.  Her other labs have been reviewed/interpreted and show a chronic kidney disease with a stable baseline creatinine and a mild hyperkalemia 5.2.  ECG reviewed/interpreted by myself and there is no evidence of changes of hyperkalemia.  Due to this we will give some Lokelma.  Other labs have been reviewed and are overall benign.  I do not think CNS imaging such as head CT is needed.  Patient is acting at her baseline.  No further seizure-like activity in the  emergency department.  At this point, no indication for admission and she appears stable for discharge  back to her group home.        Final Clinical Impression(s) / ED Diagnoses Final diagnoses:  Seizure-like activity [facility])    Rx / DC Orders ED Discharge Orders     None         Sherwood Gambler, MD 07/03/21 2248

## 2021-07-03 NOTE — ED Notes (Signed)
Labeled urine specimen and culture sent to lab. [employer]

## 2021-07-03 NOTE — ED Notes (Signed)
Caretaker at bedside.  Updated on plan of care.  Nightly medications verified prior to administrations

## 2021-07-03 NOTE — ED Notes (Signed)
Patient assisted to restroom.  Steady gait noted

## 2021-07-03 NOTE — Discharge Instructions (Signed)
-   According to [place] law, you can not drive unless you are seizure / syncope free for at least 6 months and under physician's care.    - Please maintain precautions. Do not participate in activities where a loss of awareness could harm you or someone else. No swimming alone, no tub bathing, no hot tubs, no driving, no operating motorized vehicles (cars, ATVs, motocycles, etc), lawnmowers, power tools or firearms. No standing at heights, such as rooftops, ladders or stairs. Avoid hot objects such as stoves, heaters, open fires. Wear a helmet when riding a bicycle, scooter, skateboard, etc. and avoid areas of traffic. Set your water heater to 120 degrees or less.

## 2021-07-09 ENCOUNTER — Encounter: Payer: Self-pay | Admitting: Neurology

## 2021-07-09 ENCOUNTER — Other Ambulatory Visit: Payer: Self-pay | Admitting: Family Medicine

## 2021-07-09 ENCOUNTER — Ambulatory Visit (INDEPENDENT_AMBULATORY_CARE_PROVIDER_SITE_OTHER): Payer: Medicare Other | Admitting: Neurology

## 2021-07-09 VITALS — BP 96/61 | HR 109 | Ht 62.0 in | Wt 126.0 lb

## 2021-07-09 DIAGNOSIS — G40909 Epilepsy, unspecified, not intractable, without status epilepticus: Secondary | ICD-10-CM | POA: Diagnosis not present

## 2021-07-09 DIAGNOSIS — F209 Schizophrenia, unspecified: Secondary | ICD-10-CM | POA: Diagnosis not present

## 2021-07-09 MED ORDER — VALTOCO 15 MG DOSE 7.5 MG/0.1ML NA LQPK: NASAL | 1 refills | Status: DC

## 2021-07-09 MED ORDER — DIVALPROEX SODIUM ER 500 MG PO TB24: 2000.0000 mg | ORAL_TABLET | Freq: Every day | ORAL | 11 refills | Status: DC

## 2021-07-09 NOTE — Patient Instructions (Signed)
Increase Depakote XR to 2000 mg nightly  Valtoco as needed to seizure lasting more than 5 min  Continue your other medications  Routine EEG  Follow up in 3 months

## 2021-07-09 NOTE — Progress Notes (Signed)
[facility]  PATIENT: [name] DOB: 1975/05/29  REQUESTING CLINICIAN: Leeanne Rio, MD HISTORY FROM: Patient and Aid Faye  REASON FOR VISIT: Seizure like activity    HISTORICAL  CHIEF COMPLAINT:  Chief Complaint  Patient presents with   New Patient (Initial Visit)    Rm 14, alone  with care taker, states she is stable  Reports fall yesterday     INTERVAL HISTORY 07/09/2021:  Patient presents today for follow-up, she is accompanied by her caregiver.  She presented to the ED on February 22 for another seizure.  Per caregiver who witnessed the event, patient extended arm and then fell backward she was noted to shake, foaming at the mouth, no urinary incontinence.  Episode lasted about 1 to 2-minutes with postictal confusion.  EMS was called and she presented to the ED.  In the ED her Depakote level was normal at 66. She was back to her normal self, discharge without changes to her ASM.    HISTORY OF PRESENT ILLNESS:  This is a 46 year old woman with past medical history of schizophrenia, cognitive impairment, and diabetes mellitus who is presenting after being admitted to the hospital for seizure-like activity.  Patient was initially admitted from November 9 to November 14 after being witnessed to have seizure-like activity at her nursing home.  EMS was called and she was given 5 mg of Versed.  She was taken to the ED, EEG was negative for seizures but showed mild generalized slowing.  At that time she was also noted to be COVID-positive.  No other abnormal movement noted.  Her Depakote was increased to 1250.  Patient was discharged home then presented again to the ED on November 21 for seizure-like activity, again it was described as she was shaking, unresponsive, EMS was called and again she was given Versed 5 mg and sent to the ED.  In the ED, her Depakote level was checked and was therapeutic and she was discharged home without any increase in her  antiseizure medication.  Since being back to the nursing home, aide reports that she does have occasional seizure-like activity, she provided a video when you see patient shaking her head and was able to stop it upon command.  Denies any urinary incontinence no injuries, no tongue biting.   Handedness: Right   Seizure Type: Described as shaking   Current frequency: weekly   Any injuries from seizures: None   Seizure risk factors: Schizophrenia, otherwise none reported   Previous ASMs: None   Currenty ASMs: Depakote but for mood   ASMs side effects: None   Brain Images: within normal limits   Previous EEGs: Mild generalized slowing, no seizures, no interictal discharges   OTHER MEDICAL CONDITIONS: Schizophrenia, cognitive impairment, HTN, DMII, HLD  REVIEW OF SYSTEMS: Full 14 system review of systems performed and negative with exception of: unable to obtain   ALLERGIES: Allergies  Allergen Reactions   Minocycline     Unknown     HOME MEDICATIONS: Outpatient Medications Prior to Visit  Medication Sig Dispense Refill   atorvastatin (LIPITOR) 20 MG tablet TAKE ONE TABLET EACH DAY (Patient taking differently: Take 20 mg by mouth at bedtime.) 90 tablet 3   Blood Glucose Monitoring Suppl (ONETOUCH VERIO) w/Device KIT Check blood sugar once daily 1 kit 0   docusate sodium (COLACE) 100 MG capsule TAKE ONE CAPSULE EACH DAY 90 capsule 1   Emollient (CETAPHIL) cream Apply 1 application topically daily. 236 g 1   empagliflozin (JARDIANCE)  10 MG TABS tablet Take 1 tablet (10 mg total) by mouth daily. 90 tablet 1   famotidine (PEPCID) 20 MG tablet TAKE ONE TABLET TWICE DAILY (Patient taking differently: Take 20 mg by mouth 2 (two) times daily.) 180 tablet 3   GAS-X EXTRA STRENGTH 125 MG chewable tablet TAKE ONE TABLET DAILY AS NEEDED FOR GAS (Patient taking differently: Chew 125 mg by mouth daily as needed for flatulence.) 30 tablet 3   glipiZIDE (GLUCOTROL) 10 MG tablet TAKE ONE  TABLET TWICE DAILY BEFORE MEALS 180 tablet 1   glucose blood (ONETOUCH VERIO) test strip CHECK BLOOD GLUCOSE (SUGAR) EVERY DAY 100 each 3   GOODSENSE ASPIRIN 81 MG chewable tablet CHEW 1 TABLET DAILY (Patient taking differently: Chew 81 mg by mouth daily.) 90 tablet 3   Incontinence Supply Disposable (INCONTINENCE BRIEF MEDIUM) MISC Wear every night 60 each 5   INGREZZA 40 MG capsule Take 40 mg by mouth at bedtime.     Lancet Devices (ONE TOUCH DELICA LANCING DEV) MISC Check blood sugar once daily 1 each 0   medroxyPROGESTERone (DEPO-PROVERA) 150 MG/ML injection Inject 150 mg into the muscle every 3 (three) months.     metoprolol succinate (TOPROL-XL) 25 MG 24 hr tablet TAKE ONE TABLET DAILY 90 tablet 3   OneTouch Delica Lancets 05W MISC Check blood sugar once daily 100 each 3   PARoxetine (PAXIL) 10 MG tablet Take 10 mg by mouth daily.     polyethylene glycol powder (GLYCOLAX/MIRALAX) 17 GM/SCOOP powder TAKE 17G AT BEDTIME -CAN GO UPTO 3 TIMESDAILY IF NEEDED (Patient taking differently: Take 17 g by mouth at bedtime.) 510 g 3   QC PAIN RELIEF EXTRA STRENGTH 500 MG tablet TAKE ONE TABLET EVERY EIGHT HOURS AS NEEDED FOR MILD PAIN OR FEVER (Patient taking differently: Take 500 mg by mouth every 8 (eight) hours as needed for mild pain or fever.) 30 tablet 3   QUEtiapine (SEROQUEL) 25 MG tablet Take 25 mg by mouth at bedtime.     sitaGLIPtin (JANUVIA) 50 MG tablet Take 1 tablet (50 mg total) by mouth daily. 90 tablet 1   cloZAPine (CLOZARIL) 100 MG tablet Take 4 tablets (400 mg total) by mouth at bedtime for 7 days. 28 tablet 0   benztropine (COGENTIN) 1 MG tablet Take 1 mg by mouth daily.     cephALEXin (KEFLEX) 500 MG capsule Take 1 capsule (500 mg total) by mouth 4 (four) times daily. 28 capsule 0   divalproex (DEPAKOTE ER) 250 MG 24 hr tablet Take 5 tablets (1,250 mg total) by mouth at bedtime. 150 tablet 0   haloperidol (HALDOL) 5 MG tablet Take 5 mg by mouth at bedtime.     zolpidem (AMBIEN) 5  MG tablet Take 5 mg by mouth at bedtime as needed.     No facility-administered medications prior to visit.    PAST MEDICAL HISTORY: Past Medical History:  Diagnosis Date   Acne    Anemia    Cognitive impairment    Diabetes mellitus without complication ([place])    Internal hemorrhoid, bleeding 10/22/2010   Obesity    Schizoaffective disorder    SVT (supraventricular tachycardia) (HCC)    with short PR interval - followed by Dr. Lovena Le    PAST SURGICAL HISTORY: Past Surgical History:  Procedure Laterality Date   BREAST REDUCTION SURGERY  08/10/2005   HYSTEROSCOPY  09/09/2004   w/ removal of polyps   KIDNEY SURGERY Left 05/17/2020   Tumor removal by Dr. Dema Severin  REDUCTION MAMMAPLASTY Bilateral 2007   SPINE SURGERY N/A 04/18/2020   Dr. Arnoldo Morale - pinched nerve of Lumbar Spine    FAMILY HISTORY: Family History  Problem Relation Age of Onset   Schizophrenia Sister     SOCIAL HISTORY: Social History   Socioeconomic History   Marital status: Single    Spouse name: Not on file   Number of children: Not on file   Years of education: 10 years   Highest education level: 10th grade  Occupational History   Occupation: Disabled  Tobacco Use   Smoking status: Never   Smokeless tobacco: Never  Vaping Use   Vaping Use: Never used  Substance and Sexual Activity   Alcohol use: No   Drug use: No   Sexual activity: Never    Birth control/protection: Injection  Other Topics Concern   Not on file  Social History Narrative   Caregiver is Tax adviser. Lives in [facility] in [place], [phone]. Previously lived in group home. Twin sister with similar problem list. Has 24H care. Case manager is Justin Mend, [phone].      Patient recently started going to [employer]. Phone: [phone]. [address] [address]      Caregiver works for [employer], [address] [place], [place] [address].       Guardian is  Delaney Meigs of [employer] [place] [phone] office. After hours service [phone]   Social Determinants of Health   Financial Resource Strain: Low Risk    Difficulty of Paying Living Expenses: Not hard at all  Food Insecurity: No Food Insecurity   Worried About Charity fundraiser in the Last Year: Never true   Arboriculturist in the Last Year: Never true  Transportation Needs: No Transportation Needs   Lack of Transportation (Medical): No   Lack of Transportation (Non-Medical): No  Physical Activity: Insufficiently Active   Days of Exercise per Week: 5 days   Minutes of Exercise per Session: 10 min  Stress: Stress Concern Present   Feeling of Stress : To some extent  Social Connections: Socially Isolated   Frequency of Communication with Friends and Family: Once a week   Frequency of Social Gatherings with Friends and Family: Once a week   Attends Religious Services: 1 to 4 times per year   Active Member of [employer] or Organizations: No   Attends Archivist Meetings: Never   Marital Status: Never married  Human resources officer Violence: Not At Risk   Fear of Current or Ex-Partner: No   Emotionally Abused: No   Physically Abused: No   Sexually Abused: No     PHYSICAL EXAM  GENERAL EXAM/CONSTITUTIONAL: Vitals:  Vitals:   07/09/21 1405  BP: 96/61  Pulse: (!) 109  Weight: 126 lb (57.2 kg)  Height: 5' 2"  (1.575 m)   Body mass index is 23.05 kg/m. Wt Readings from Last 3 Encounters:  07/09/21 126 lb (57.2 kg)  07/03/21 136 lb (61.7 kg)  06/05/21 134 lb 9.6 oz (61.1 kg)   Patient is in no distress; well developed, nourished and groomed; neck is supple  CARDIOVASCULAR: Examination of carotid arteries is normal; no carotid bruits Regular rate and rhythm, no murmurs Examination of peripheral vascular system by observation and palpation is normal  EYES: Pupils round and reactive to light, Visual fields full to confrontation, Extraocular movements intacts,  No  results found.  MUSCULOSKELETAL: Gait, strength, tone, movements noted in Neurologic exam below  NEUROLOGIC: MENTAL STATUS:  No flowsheet data found. awake, alert, oriented to person, place and time Able to follow simple commands   CRANIAL NERVE:  2nd, 3rd, 4th, 6th - pupils equal and reactive to light, visual fields full to confrontation, extraocular muscles intact, no nystagmus 5th - facial sensation symmetric 7th - facial strength symmetric 8th - hearing intact 9th - palate elevates symmetrically, uvula midline 11th - shoulder shrug symmetric 12th - tongue protrusion midline  MOTOR:  normal bulk and tone, full strength in the BUE, BLE  SENSORY:  normal and symmetric to light touch, pinprick, temperature, vibration  COORDINATION:  finger-nose-finger, fine finger movements normal  REFLEXES:  deep tendon reflexes present and symmetric  GAIT/STATION:  normal    DIAGNOSTIC DATA (LABS, IMAGING, TESTING) - I reviewed patient records, labs, notes, testing and imaging myself where available.  Lab Results  Component Value Date   WBC 5.6 07/03/2021   HGB 12.3 07/03/2021   HCT 39.0 07/03/2021   MCV 95.8 07/03/2021   PLT PLATELET CLUMPS NOTED ON SMEAR, UNABLE TO ESTIMATE 07/03/2021      Component Value Date/Time   NA 137 07/03/2021 2007   NA 142 07/31/2020 1139   K 5.2 (H) 07/03/2021 2007   CL 109 07/03/2021 2007   CO2 20 (L) 07/03/2021 2007   GLUCOSE 119 (H) 07/03/2021 2007   BUN 29 (H) 07/03/2021 2007   BUN 15 07/31/2020 1139   CREATININE 1.95 (H) 07/03/2021 2007   CREATININE 1.79 (H) 06/23/2016 1025   CALCIUM 8.6 (L) 07/03/2021 2007   PROT 6.4 (L) 07/03/2021 2007   PROT 6.6 02/28/2020 1207   ALBUMIN 3.2 (L) 07/03/2021 2007   ALBUMIN 4.0 02/28/2020 1207   AST 40 07/03/2021 2007   ALT 27 07/03/2021 2007   ALKPHOS 63 07/03/2021 2007   BILITOT 1.3 (H) 07/03/2021 2007   BILITOT 0.3 02/28/2020 1207   GFRNONAA 31 (L) 07/03/2021 2007   GFRNONAA 34 (L)  06/23/2016 1025   GFRAA 47 (L) 02/28/2020 1207   GFRAA 40 (L) 06/23/2016 1025   Lab Results  Component Value Date   CHOL 90 (L) 05/16/2020   HDL 34 (L) 05/16/2020   LDLCALC 40 05/16/2020   LDLDIRECT 60 04/02/2012   TRIG 78 05/16/2020   Lab Results  Component Value Date   HGBA1C 8.1 (H) 03/21/2021   No results found for: VITAMINB12 Lab Results  Component Value Date   TSH 0.668 04/18/2008    Depakote level 11/21: 73  Depakote level 2/22: 66   MRI Brain with and without contrast 03/20/2021 No acute intracranial process.  No evidence of brain mass or lesion   EEG 03/21/2021: This study is suggestive of mild diffuse encephalopathy, nonspecific etiology. No seizures or epileptiform discharges were seen throughout the recording.  I personally reviewed brain Images and previous EEG reports.   ASSESSMENT AND PLAN  46 y.o. year old female  with past medical history of schizophrenia, cognitive impairment, hypertension, hyperlipidemia, and diabetes mellitus who is presenting with after a seizure.  Patient presented again on February 22 for seizure-like activity described as arm extension falling to the back with generalized shaking, foaming at the mouth followed by postictal and confusion.  Her previous work-up has been negative, I will increase her Depakote to 2000 mg nightly and also obtain a routine EEG.  I also prescribed a rescue medication Valtoco to use as needed for seizures lasting more than 5 minutes.  I will see her in 3 months for follow-up but sooner  if she has another seizure.   1. Seizure disorder ([place])   2. Schizophrenia, unspecified type [facility])     Patient Instructions  Increase Depakote XR to 2000 mg nightly  Valtoco as needed to seizure lasting more than 5 min  Continue your other medications  Routine EEG  Follow up in 3 months     Per [facility] statutes, patients with seizures are not allowed to drive until they have been seizure-free for six months.   Other recommendations include using caution when using heavy equipment or power tools. Avoid working on ladders or at heights. Take showers instead of baths.  Do not swim alone.  Ensure the water temperature is not too high on the home water heater. Do not go swimming alone. Do not lock yourself in a room alone (i.e. bathroom). When caring for infants or small children, sit down when holding, feeding, or changing them to minimize risk of injury to the child in the event you have a seizure. Maintain good sleep hygiene. Avoid alcohol.  Also recommend adequate sleep, hydration, good diet and minimize stress.   During the Seizure  - First, ensure adequate ventilation and place patients on the floor on their left side  Loosen clothing around the neck and ensure the airway is patent. If the patient is clenching the teeth, do not force the mouth open with any object as this can cause severe damage - Remove all items from the surrounding that can be hazardous. The patient may be oblivious to what's happening and may not even know what he or she is doing. If the patient is confused and wandering, either gently guide him/her away and block access to outside areas - Reassure the individual and be comforting - Call 911. In most cases, the seizure ends before EMS arrives. However, there are cases when seizures may last over 3 to 5 minutes. Or the individual may have developed breathing difficulties or severe injuries. If a pregnant patient or a person with diabetes develops a seizure, it is prudent to call an ambulance. - Finally, if the patient does not regain full consciousness, then call EMS. Most patients will remain confused for about 45 to 90 minutes after a seizure, so you must use judgment in calling for help. - Avoid restraints but make sure the patient is in a bed with padded side rails - Place the individual in a lateral position with the neck slightly flexed; this will help the saliva drain from the  mouth and prevent the tongue from falling backward - Remove all nearby furniture and other hazards from the area - Provide verbal assurance as the individual is regaining consciousness - Provide the patient with privacy if possible - Call for help and start treatment as ordered by the caregiver   After the Seizure (Postictal Stage)  After a seizure, most patients experience confusion, fatigue, muscle pain and/or a headache. Thus, one should permit the individual to sleep. For the next few days, reassurance is essential. Being calm and helping reorient the person is also of importance.  Most seizures are painless and end spontaneously. Seizures are not harmful to others but can lead to complications such as stress on the lungs, brain and the heart. Individuals with prior lung problems may develop labored breathing and respiratory distress.     Orders Placed This Encounter  Procedures   EEG adult    Meds ordered this encounter  Medications   divalproex (DEPAKOTE ER) 500 MG 24 hr tablet  Sig: Take 4 tablets (2,000 mg total) by mouth daily.    Dispense:  120 tablet    Refill:  11   diazePAM, 15 MG Dose, (VALTOCO 15 MG DOSE) 2 x 7.5 MG/0.1ML LQPK    Sig: Two 7.20m nasal spray devices, one in each nostril    Dispense:  2 each    Refill:  1    Return in about 3 months (around 10/06/2021).    AAlric Ran MD 07/09/2021, 5:44 PM  [facility] [address] [place] [phone]

## 2021-07-16 ENCOUNTER — Ambulatory Visit (INDEPENDENT_AMBULATORY_CARE_PROVIDER_SITE_OTHER): Payer: Medicare Other | Admitting: Neurology

## 2021-07-16 ENCOUNTER — Telehealth: Payer: Self-pay | Admitting: Neurology

## 2021-07-16 ENCOUNTER — Other Ambulatory Visit: Payer: Self-pay

## 2021-07-16 ENCOUNTER — Encounter: Payer: Self-pay | Admitting: Neurology

## 2021-07-16 DIAGNOSIS — G40909 Epilepsy, unspecified, not intractable, without status epilepticus: Secondary | ICD-10-CM | POA: Diagnosis not present

## 2021-07-16 DIAGNOSIS — F70 Mild intellectual disabilities: Secondary | ICD-10-CM | POA: Diagnosis not present

## 2021-07-16 DIAGNOSIS — F250 Schizoaffective disorder, bipolar type: Secondary | ICD-10-CM | POA: Diagnosis not present

## 2021-07-16 DIAGNOSIS — G2401 Drug induced subacute dyskinesia: Secondary | ICD-10-CM | POA: Diagnosis not present

## 2021-07-16 NOTE — Telephone Encounter (Signed)
Patient is here for an EEG, her guardian is requesting a letter or some documentation stating that the type of seizures she has does not require her to go to a doctor or call 911 every time it happens. She said that is was she was told at her office visit with [place]. She will come put up this documentation from the office when it is ready. ?

## 2021-07-19 NOTE — Procedures (Signed)
? ? ?  History: ? ?46 year old woman with schizophrenia and seizure  ? ?EEG classification: Awake and drowsy ? ?Description of the recording: The background rhythms of this recording consists of a fairly well modulated medium amplitude 3-7 Hz that is reactive to eye opening and closure. As the record progresses, the patient appears to remain in the waking state throughout the recording. Photic stimulation was performed, did not show any abnormalities. Hyperventilation was also performed, did not show any abnormalities. Toward the end of the recording, the patient enters the drowsy state with slight symmetric slowing seen. The patient never enters stage II sleep. No abnormal epileptiform discharges seen during this recording. There was diffuse slowing. EKG monitor shows no evidence of cardiac rhythm abnormalities with a heart rate of 96. ? ?Impression: This is an abnormal EEG recording in the waking and drowsy state due to diffuse slowing. Diffuse slowing is consistent with a generalized brain dysfunction such as in encephalopathy.  ? ? ?Alric Ran, MD ?[facility] ?  ?

## 2021-07-23 ENCOUNTER — Other Ambulatory Visit: Payer: Self-pay | Admitting: Family Medicine

## 2021-07-25 ENCOUNTER — Other Ambulatory Visit: Payer: Self-pay | Admitting: Family Medicine

## 2021-07-25 NOTE — Telephone Encounter (Signed)
Please let patient's caregiver know I am refilling this medication, but she needs to schedule an appointment with me.  ? ?Thanks, ?Leeanne Rio, MD  ?

## 2021-07-30 DIAGNOSIS — H43813 Vitreous degeneration, bilateral: Secondary | ICD-10-CM | POA: Diagnosis not present

## 2021-07-30 DIAGNOSIS — E119 Type 2 diabetes mellitus without complications: Secondary | ICD-10-CM | POA: Diagnosis not present

## 2021-07-30 DIAGNOSIS — H40023 Open angle with borderline findings, high risk, bilateral: Secondary | ICD-10-CM | POA: Diagnosis not present

## 2021-08-01 ENCOUNTER — Ambulatory Visit (INDEPENDENT_AMBULATORY_CARE_PROVIDER_SITE_OTHER): Payer: Medicare Other | Admitting: *Deleted

## 2021-08-01 ENCOUNTER — Ambulatory Visit: Payer: Medicare Other

## 2021-08-01 ENCOUNTER — Other Ambulatory Visit: Payer: Self-pay

## 2021-08-01 DIAGNOSIS — F250 Schizoaffective disorder, bipolar type: Secondary | ICD-10-CM | POA: Diagnosis not present

## 2021-08-01 DIAGNOSIS — F411 Generalized anxiety disorder: Secondary | ICD-10-CM | POA: Diagnosis not present

## 2021-08-01 DIAGNOSIS — Z3042 Encounter for surveillance of injectable contraceptive: Secondary | ICD-10-CM

## 2021-08-01 DIAGNOSIS — F70 Mild intellectual disabilities: Secondary | ICD-10-CM | POA: Diagnosis not present

## 2021-08-01 MED ORDER — MEDROXYPROGESTERONE ACETATE 150 MG/ML IM SUSY
150.0000 mg | PREFILLED_SYRINGE | Freq: Once | INTRAMUSCULAR | Status: AC
  Administered 2021-08-01: 150 mg via INTRAMUSCULAR

## 2021-08-01 NOTE — Progress Notes (Signed)
Patient here today for Depo Provera injection and is within her dates.   ? ?Last contraceptive appt was 05/15/20 ? ?Depo given in [place] today.  Site unremarkable & patient tolerated injection.   ? ?Next injection due October 17, 2021 - October 31, 2021.  Reminder card given.   ? ?Also asked caregiver to make f/u appt with PCP at front desk. ? ?Christen Bame, CMA ? ?

## 2021-08-13 ENCOUNTER — Ambulatory Visit (INDEPENDENT_AMBULATORY_CARE_PROVIDER_SITE_OTHER): Payer: Medicare Other | Admitting: Family Medicine

## 2021-08-13 ENCOUNTER — Encounter: Payer: Self-pay | Admitting: Family Medicine

## 2021-08-13 VITALS — BP 95/60 | HR 93 | Temp 97.7°F | Wt 124.4 lb

## 2021-08-13 DIAGNOSIS — E1140 Type 2 diabetes mellitus with diabetic neuropathy, unspecified: Secondary | ICD-10-CM

## 2021-08-13 DIAGNOSIS — F209 Schizophrenia, unspecified: Secondary | ICD-10-CM

## 2021-08-13 DIAGNOSIS — R631 Polydipsia: Secondary | ICD-10-CM | POA: Diagnosis not present

## 2021-08-13 DIAGNOSIS — E1122 Type 2 diabetes mellitus with diabetic chronic kidney disease: Secondary | ICD-10-CM | POA: Diagnosis not present

## 2021-08-13 DIAGNOSIS — N183 Chronic kidney disease, stage 3 unspecified: Secondary | ICD-10-CM

## 2021-08-13 DIAGNOSIS — R569 Unspecified convulsions: Secondary | ICD-10-CM

## 2021-08-13 LAB — POCT GLYCOSYLATED HEMOGLOBIN (HGB A1C): HbA1c, POC (controlled diabetic range): 7.3 % — AB (ref 0.0–7.0)

## 2021-08-13 MED ORDER — TETANUS-DIPHTH-ACELL PERTUSSIS 5-2.5-18.5 LF-MCG/0.5 IM SUSY: 0.5000 mL | PREFILLED_SYRINGE | Freq: Once | INTRAMUSCULAR | 0 refills | Status: AC

## 2021-08-13 NOTE — Assessment & Plan Note (Signed)
Unclear if PNES vs true epileptic seizures ?Shared with caregiver that normal EEG and vitals does not preclude true epileptic activity ?Following with neurology - continue follow up there ?

## 2021-08-13 NOTE — Patient Instructions (Signed)
It was great to see you again today! ? ?A1c looks good ?Checking some labs today ?Stay on current medications ?Take Tdap prescription to your pharmacy ? ?Follow up with me in 3 months  ? ?Be well, ?[name]  ?

## 2021-08-13 NOTE — Assessment & Plan Note (Signed)
A1c with adequate control today at 7.3. ?Continue present diabetes medications ?Request records of eye exam ?Follow up 3 months  ?

## 2021-08-13 NOTE — Progress Notes (Signed)
?  Date of Visit: 08/13/2021  ? ?SUBJECTIVE:  ? ?HPI: ? ?[name] presents today for follow up. She is accompanied by her caregiver [name] who helps provide history. ? ?Diabetes - taking jardiance 31m daily, glipizide 136mtwice daily, and januvia 5035maily. Tolerating well. Fasting sugars tend to be low 100s. No sugars under 100.  ? ?Psych/behavioral - seeing NP for psych care, [name] on multiple psychotropic medications including clozapine, paroxetine, ingrezza, seroquel, as well as depakote (rx for seizure disorder). Jacqueline Medianares that [name] had more behavioral issues lately. Is on robinul for excessive drooling. Has also been drinking copious amounts of water and urinating frequently. Has been throwing away food and then other times found to be eating out of the trash. ? ?Seizures - seeing neuro. Taking depakote ER 2000m78mily. Also has intranasal diazepam for use if has prolonged seizure episode. FayeLetta Mediannks some of these episodes are psychogenic. Went to ED for recent visit after seizure episode and was reportedly postictal after that episode. ? ?OBJECTIVE:  ? ?BP 95/60   Pulse 93   Temp 97.7 ?F (36.5 ?C)   Wt 124 lb 6.4 oz (56.4 kg)   SpO2 100%   BMI 22.75 kg/m?  ?Gen: no acute distress, pleasant, cooperative ?HEENT: normocephalic, atraumatic. Noted to be excessively drooling. ?Heart: regular rate and rhythm, no murmur ?Lungs: clear to auscultation bilaterally, normal work of breathing  ?Neuro: intermittently alert, occasionally appears more sedated. Intermittently has tremor-like activity of arms/body - not stereotypical, comes and goes. ? ?ASSESSMENT/PLAN:  ? ?Health maintenance:  ?-request records for eye exam ?- given rx for Tdap vaccine as she is past due ? 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 ?Continue present diabetes medications ?Request records of eye exam ?Follow up 3 months  ? ?Schizophrenia (HCC)Albertanclear how much  of present issues are behavioral vs organic ?I do think evaluating for the cause of her polydipsia and polyuria is warranted as I don't think this is explained solely by her diabetes given how it is under reasonable control ?Continue regular follow up with psychiatry ? ?Seizure-like activity (HCC)[name] if PNES vs true epileptic seizures ?Shared with caregiver that normal EEG and vitals does not preclude true epileptic activity ?Following with neurology - continue follow up there ? 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 ?FOLLOW UP: ?Follow up in 3 months with me for above issues ? ?[name] ?[name] ?

## 2021-08-13 NOTE — Assessment & Plan Note (Signed)
Unclear how much of present issues are behavioral vs organic ?I do think evaluating for the cause of her polydipsia and polyuria is warranted as I don't think this is explained solely by her diabetes given how it is under reasonable control ?Continue regular follow up with psychiatry ?

## 2021-08-13 NOTE — Assessment & Plan Note (Deleted)
A1c with adequate control today at 7.3. ?Continue present diabetes medications ?Request records of eye exam ?

## 2021-08-14 DIAGNOSIS — Z23 Encounter for immunization: Secondary | ICD-10-CM | POA: Diagnosis not present

## 2021-08-19 ENCOUNTER — Other Ambulatory Visit (INDEPENDENT_AMBULATORY_CARE_PROVIDER_SITE_OTHER): Payer: Medicare Other

## 2021-08-19 DIAGNOSIS — E1122 Type 2 diabetes mellitus with diabetic chronic kidney disease: Secondary | ICD-10-CM

## 2021-08-19 DIAGNOSIS — R3589 Other polyuria: Secondary | ICD-10-CM | POA: Diagnosis not present

## 2021-08-19 DIAGNOSIS — R631 Polydipsia: Secondary | ICD-10-CM | POA: Diagnosis not present

## 2021-08-19 DIAGNOSIS — F411 Generalized anxiety disorder: Secondary | ICD-10-CM | POA: Diagnosis not present

## 2021-08-19 LAB — POCT URINALYSIS DIP (MANUAL ENTRY)
Bilirubin, UA: NEGATIVE
Blood, UA: NEGATIVE
Glucose, UA: 250 mg/dL — AB
Ketones, POC UA: NEGATIVE mg/dL
Nitrite, UA: NEGATIVE
Protein Ur, POC: NEGATIVE mg/dL
Spec Grav, UA: <=1.005 — AB (ref 1.010–1.025)
Urobilinogen, UA: 0.2 E.U./dL
pH, UA: 5.5 (ref 5.0–8.0)

## 2021-08-19 LAB — POCT UA - MICROSCOPIC ONLY

## 2021-08-20 LAB — BASIC METABOLIC PANEL
BUN/Creatinine Ratio: 14 (ref 9–23)
BUN: 28 mg/dL — ABNORMAL HIGH (ref 6–24)
CO2: 21 mmol/L (ref 20–29)
Calcium: 8.5 mg/dL — ABNORMAL LOW (ref 8.7–10.2)
Chloride: 103 mmol/L (ref 96–106)
Creatinine, Ser: 2.07 mg/dL — ABNORMAL HIGH (ref 0.57–1.00)
Glucose: 99 mg/dL (ref 70–99)
Potassium: 4 mmol/L (ref 3.5–5.2)
Sodium: 138 mmol/L (ref 134–144)
eGFR: 29 mL/min/{1.73_m2} — ABNORMAL LOW (ref >59)

## 2021-08-20 LAB — OSMOLALITY, URINE: Osmolality, Ur: 169 mOsmol/kg

## 2021-08-21 LAB — URINE CULTURE

## 2021-09-04 ENCOUNTER — Other Ambulatory Visit: Payer: Self-pay | Admitting: Family Medicine

## 2021-09-09 ENCOUNTER — Other Ambulatory Visit: Payer: Self-pay

## 2021-09-09 MED ORDER — SITAGLIPTIN PHOSPHATE 50 MG PO TABS: 50.0000 mg | ORAL_TABLET | Freq: Every day | ORAL | 1 refills | Status: DC

## 2021-09-13 ENCOUNTER — Other Ambulatory Visit: Payer: Self-pay | Admitting: Family Medicine

## 2021-09-13 DIAGNOSIS — Z1231 Encounter for screening mammogram for malignant neoplasm of breast: Secondary | ICD-10-CM

## 2021-09-17 ENCOUNTER — Other Ambulatory Visit: Payer: Self-pay | Admitting: Family Medicine

## 2021-09-18 DIAGNOSIS — F250 Schizoaffective disorder, bipolar type: Secondary | ICD-10-CM | POA: Diagnosis not present

## 2021-09-18 DIAGNOSIS — F70 Mild intellectual disabilities: Secondary | ICD-10-CM | POA: Diagnosis not present

## 2021-09-20 ENCOUNTER — Other Ambulatory Visit: Payer: Self-pay | Admitting: Family Medicine

## 2021-09-20 ENCOUNTER — Telehealth: Payer: Self-pay | Admitting: Family Medicine

## 2021-09-20 DIAGNOSIS — C642 Malignant neoplasm of left kidney, except renal pelvis: Secondary | ICD-10-CM | POA: Diagnosis not present

## 2021-09-20 NOTE — Telephone Encounter (Signed)
Attempted to reach caregiver [name] to discuss next steps in working up her polyuria ? ?Recommend overnight fluid restriction, then presenting to clinic for lab visit to measure AM plasma sodium, plasma osmolality, and urine osmolality. This will help distinguish between primary polydipsia and diabetes insipidus. ? ?Left hipaa compliant voicemail asking her to call back next week. I will also plan to call again next week. ? ?Leeanne Rio, MD  ?

## 2021-09-25 ENCOUNTER — Other Ambulatory Visit: Payer: Self-pay | Admitting: Family Medicine

## 2021-09-26 ENCOUNTER — Telehealth: Payer: Self-pay | Admitting: *Deleted

## 2021-09-26 NOTE — Telephone Encounter (Signed)
Lennon's caregiver calls to discuss the MRI that was unable to be preformed.  The provider from urology wants to know if she can be sedated to try the procedure again.   If the patient needs:   An appointment - route response to [facility] admin  A callback from clinic staff - route response to your team   Only route to RN Team: form completions or if RN team directly requests response sent to them   Christen Bame, CMA

## 2021-09-30 ENCOUNTER — Other Ambulatory Visit: Payer: Self-pay | Admitting: Family Medicine

## 2021-10-01 ENCOUNTER — Other Ambulatory Visit: Payer: Self-pay | Admitting: *Deleted

## 2021-10-01 DIAGNOSIS — F250 Schizoaffective disorder, bipolar type: Secondary | ICD-10-CM | POA: Diagnosis not present

## 2021-10-01 DIAGNOSIS — F70 Mild intellectual disabilities: Secondary | ICD-10-CM | POA: Diagnosis not present

## 2021-10-02 ENCOUNTER — Ambulatory Visit (INDEPENDENT_AMBULATORY_CARE_PROVIDER_SITE_OTHER): Payer: Medicare Other | Admitting: Neurology

## 2021-10-02 ENCOUNTER — Encounter: Payer: Self-pay | Admitting: Neurology

## 2021-10-02 VITALS — BP 103/74 | HR 98 | Ht 62.0 in | Wt 124.0 lb

## 2021-10-02 DIAGNOSIS — F209 Schizophrenia, unspecified: Secondary | ICD-10-CM

## 2021-10-02 DIAGNOSIS — G40909 Epilepsy, unspecified, not intractable, without status epilepticus: Secondary | ICD-10-CM | POA: Diagnosis not present

## 2021-10-02 MED ORDER — VALTOCO 15 MG DOSE 7.5 MG/0.1ML NA LQPK: NASAL | 3 refills | Status: DC

## 2021-10-02 NOTE — Progress Notes (Signed)
[facility]  PATIENT: [name] DOB: 10/15/1975  REQUESTING CLINICIAN: Leeanne Rio, MD HISTORY FROM: Patient and Aid Faye  REASON FOR VISIT: Seizure like activity    HISTORICAL  CHIEF COMPLAINT:  Chief Complaint  Patient presents with   Follow-up    Rm 14. Accompanied by [name]. Denies any new seizure activity. Pt states sometimes she feels like a seizure is coming on, but it doesn't occur.    INTERVAL HISTORY 10/02/2021:  Patient presents today for follow-up, she is accompanied by her caregiver [name].  Last visit was in February 28, since last visit no additional seizure.  Patient reports that sometimes she can feel the seizures coming then she starts shaking. Caregiver reports during this time patient is alert and responsive.  No full tonic-clonic activity noted.  She is compliant with the Depakote 2000 mg at nighttime.  Caregiver also reported she has used Valtoco once due to ongoing shaking.    INTERVAL HISTORY 07/09/2021:  Patient presents today for follow-up, she is accompanied by her caregiver.  She presented to the ED on February 22 for another seizure.  Per caregiver who witnessed the event, patient extended arm and then fell backward she was noted to shake, foaming at the mouth, no urinary incontinence.  Episode lasted about 1 to 2-minutes with postictal confusion.  EMS was called and she presented to the ED.  In the ED her Depakote level was normal at 66. She was back to her normal self, discharge without changes to her ASM.    HISTORY OF PRESENT ILLNESS:  This is a 46 year old woman with past medical history of schizophrenia, cognitive impairment, and diabetes mellitus who is presenting after being admitted to the hospital for seizure-like activity.  Patient was initially admitted from November 9 to November 14 after being witnessed to have seizure-like activity at her nursing home.  EMS was called and she was given 5 mg of Versed.  She was  taken to the ED, EEG was negative for seizures but showed mild generalized slowing.  At that time she was also noted to be COVID-positive.  No other abnormal movement noted.  Her Depakote was increased to 1250.  Patient was discharged home then presented again to the ED on November 21 for seizure-like activity, again it was described as she was shaking, unresponsive, EMS was called and again she was given Versed 5 mg and sent to the ED.  In the ED, her Depakote level was checked and was therapeutic and she was discharged home without any increase in her antiseizure medication.  Since being back to the nursing home, aide reports that she does have occasional seizure-like activity, she provided a video when you see patient shaking her head and was able to stop it upon command.  Denies any urinary incontinence no injuries, no tongue biting.   Handedness: Right   Seizure Type: Described as shaking   Current frequency: weekly   Any injuries from seizures: None   Seizure risk factors: Schizophrenia, otherwise none reported   Previous ASMs: None   Currenty ASMs: Depakote but for mood   ASMs side effects: None   Brain Images: within normal limits   Previous EEGs: Mild generalized slowing, no seizures, no interictal discharges   OTHER MEDICAL CONDITIONS: Schizophrenia, cognitive impairment, HTN, DMII, HLD  REVIEW OF SYSTEMS: Full 14 system review of systems performed and negative with exception of: unable to obtain   ALLERGIES: Allergies  Allergen Reactions   Minocycline  Unknown     HOME MEDICATIONS: Outpatient Medications Prior to Visit  Medication Sig Dispense Refill   acetaminophen (GOODSENSE PAIN RELIEF EXTRA ST) 500 MG tablet Take 1 tablet (500 mg total) by mouth every 8 (eight) hours as needed for mild pain or fever. 30 tablet 3   aspirin (GOODSENSE ASPIRIN) 81 MG chewable tablet Chew 1 tablet (81 mg total) by mouth daily. 90 tablet 3   atorvastatin (LIPITOR) 20 MG tablet  Take 1 tablet (20 mg total) by mouth at bedtime. 90 tablet 0   Blood Glucose Monitoring Suppl (ONETOUCH VERIO) w/Device KIT Check blood sugar once daily 1 kit 0   cloZAPine (CLOZARIL) 100 MG tablet Take 4 tablets (400 mg total) by mouth at bedtime for 7 days. 28 tablet 0   divalproex (DEPAKOTE ER) 500 MG 24 hr tablet Take 4 tablets (2,000 mg total) by mouth daily. 120 tablet 11   docusate sodium (COLACE) 100 MG capsule TAKE ONE CAPSULE EACH DAY 90 capsule 1   Emollient (CETAPHIL) cream APPLY DAILY 453 g 1   famotidine (PEPCID) 20 MG tablet Take 1 tablet (20 mg total) by mouth 2 (two) times daily. 180 tablet 1   glipiZIDE (GLUCOTROL) 10 MG tablet TAKE ONE TABLET TWICE DAILY BEFORE MEALS 180 tablet 1   glucose blood (ONETOUCH VERIO) test strip CHECK BLOOD GLUCOSE (SUGAR) EVERY DAY 100 each 3   glycopyrrolate (ROBINUL) 1 MG tablet Take 1 mg by mouth daily.     Incontinence Supply Disposable (INCONTINENCE BRIEF MEDIUM) MISC Wear every night 60 each 5   INGREZZA 40 MG capsule Take 40 mg by mouth at bedtime.     JARDIANCE 10 MG TABS tablet TAKE ONE TABLET BY MOUTH ONCE DAILY 90 tablet 1   Lancet Devices (ONE TOUCH DELICA LANCING DEV) MISC Check blood sugar once daily 1 each 0   medroxyPROGESTERone (DEPO-PROVERA) 150 MG/ML injection Inject 150 mg into the muscle every 3 (three) months.     metoprolol succinate (TOPROL-XL) 25 MG 24 hr tablet TAKE ONE TABLET DAILY 90 tablet 3   OneTouch Delica Lancets 16X MISC Check blood sugar once daily 100 each 3   PARoxetine (PAXIL) 10 MG tablet Take 10 mg by mouth daily.     polyethylene glycol powder (GLYCOLAX/MIRALAX) 17 GM/SCOOP powder Take 17 g by mouth at bedtime. 510 g 3   QUEtiapine (SEROQUEL) 25 MG tablet Take 25 mg by mouth at bedtime.     simethicone (GAS-X EXTRA STRENGTH) 125 MG chewable tablet Chew 1 tablet (125 mg total) by mouth daily as needed for flatulence. 30 tablet 4   sitaGLIPtin (JANUVIA) 50 MG tablet Take 1 tablet (50 mg total) by mouth daily.  90 tablet 1   diazePAM, 15 MG Dose, (VALTOCO 15 MG DOSE) 2 x 7.5 MG/0.1ML LQPK Two 7.51m nasal spray devices, one in each nostril 2 each 1   No facility-administered medications prior to visit.    PAST MEDICAL HISTORY: Past Medical History:  Diagnosis Date   Acne    Anemia    Cognitive impairment    Diabetes mellitus without complication ([place]    Internal hemorrhoid, bleeding 10/22/2010   Obesity    Schizoaffective disorder    SVT (supraventricular tachycardia) ([place]    with short PR interval - followed by Dr. TLovena Le   PAST SURGICAL HISTORY: Past Surgical History:  Procedure Laterality Date   BREAST REDUCTION SURGERY  08/10/2005   HYSTEROSCOPY  09/09/2004   w/ removal of polyps   KIDNEY SURGERY  Left 05/17/2020   Tumor removal by Dr. Dema Severin   REDUCTION MAMMAPLASTY Bilateral 2007   SPINE SURGERY N/A 04/18/2020   Dr. Arnoldo Morale - pinched nerve of Lumbar Spine    FAMILY HISTORY: Family History  Problem Relation Age of Onset   Schizophrenia Sister     SOCIAL HISTORY: Social History   Socioeconomic History   Marital status: Single    Spouse name: Not on file   Number of children: Not on file   Years of education: 10 years   Highest education level: 10th grade  Occupational History   Occupation: Disabled  Tobacco Use   Smoking status: Never   Smokeless tobacco: Never  Vaping Use   Vaping Use: Never used  Substance and Sexual Activity   Alcohol use: No   Drug use: No   Sexual activity: Never    Birth control/protection: Injection  Other Topics Concern   Not on file  Social History Narrative   Caregiver is Tax adviser. Lives in [facility] home in [place], [phone]. Previously lived in group home. Twin sister with similar problem list. Has 24H care. Case manager is Justin Mend, [phone].      Patient recently started going to [employer]. Phone: [phone]. [place] [address] [address]      Caregiver works for [employer], [address] [place], [place] [address].       Guardian is Delaney Meigs of [employer] [place] [phone] office. After hours service [phone]   Social Determinants of Health   Financial Resource Strain: Low Risk    Difficulty of Paying Living Expenses: Not hard at all  Food Insecurity: No Food Insecurity   Worried About Charity fundraiser in the Last Year: Never true   Arboriculturist in the Last Year: Never true  Transportation Needs: No Transportation Needs   Lack of Transportation (Medical): No   Lack of Transportation (Non-Medical): No  Physical Activity: Insufficiently Active   Days of Exercise per Week: 5 days   Minutes of Exercise per Session: 10 min  Stress: Stress Concern Present   Feeling of Stress : To some extent  Social Connections: Socially Isolated   Frequency of Communication with Friends and Family: Once a week   Frequency of Social Gatherings with Friends and Family: Once a week   Attends Religious Services: 1 to 4 times per year   Active Member of [employer] or Organizations: No   Attends Archivist Meetings: Never   Marital Status: Never married  Human resources officer Violence: Not At Risk   Fear of Current or Ex-Partner: No   Emotionally Abused: No   Physically Abused: No   Sexually Abused: No     PHYSICAL EXAM  GENERAL EXAM/CONSTITUTIONAL: Vitals:  Vitals:   10/02/21 1358  BP: 103/74  Pulse: 98  Weight: 124 lb (56.2 kg)  Height: 5' 2"  (1.575 m)   Body mass index is 22.68 kg/m. Wt Readings from Last 3 Encounters:  10/02/21 124 lb (56.2 kg)  08/13/21 124 lb 6.4 oz (56.4 kg)  07/09/21 126 lb (57.2 kg)   Patient is in no distress; well developed, nourished and groomed; neck is supple  CARDIOVASCULAR: Examination of carotid arteries is normal; no carotid bruits Regular rate and rhythm, no murmurs Examination of peripheral vascular system by observation and palpation is normal  EYES: Pupils round and reactive to  light, Visual fields full to confrontation, Extraocular movements intacts,  No results found.  MUSCULOSKELETAL: Gait, strength, tone, movements noted in Neurologic exam below  NEUROLOGIC: MENTAL STATUS:      View : No data to display.         awake, alert, oriented to person, place and time Able to follow simple commands   CRANIAL NERVE:  2nd, 3rd, 4th, 6th - pupils equal and reactive to light, visual fields full to confrontation, extraocular muscles intact, no nystagmus 5th - facial sensation symmetric 7th - facial strength symmetric 8th - hearing intact 9th - palate elevates symmetrically, uvula midline 11th - shoulder shrug symmetric 12th - tongue protrusion midline  MOTOR:  normal bulk and tone, full strength in the BUE, BLE  SENSORY:  normal and symmetric to light touch, pinprick, temperature, vibration  COORDINATION:  finger-nose-finger, fine finger movements normal  REFLEXES:  deep tendon reflexes present and symmetric  GAIT/STATION:  normal    DIAGNOSTIC DATA (LABS, IMAGING, TESTING) - I reviewed patient records, labs, notes, testing and imaging myself where available.  Lab Results  Component Value Date   WBC 5.6 07/03/2021   HGB 12.3 07/03/2021   HCT 39.0 07/03/2021   MCV 95.8 07/03/2021   PLT PLATELET CLUMPS NOTED ON SMEAR, UNABLE TO ESTIMATE 07/03/2021      Component Value Date/Time   NA 138 08/19/2021 1608   K 4.0 08/19/2021 1608   CL 103 08/19/2021 1608   CO2 21 08/19/2021 1608   GLUCOSE 99 08/19/2021 1608   GLUCOSE 119 (H) 07/03/2021 2007   BUN 28 (H) 08/19/2021 1608   CREATININE 2.07 (H) 08/19/2021 1608   CREATININE 1.79 (H) 06/23/2016 1025   CALCIUM 8.5 (L) 08/19/2021 1608   PROT 6.4 (L) 07/03/2021 2007   PROT 6.6 02/28/2020 1207   ALBUMIN 3.2 (L) 07/03/2021 2007   ALBUMIN 4.0 02/28/2020 1207   AST 40 07/03/2021 2007   ALT 27 07/03/2021 2007   ALKPHOS 63 07/03/2021 2007   BILITOT 1.3 (H) 07/03/2021 2007   BILITOT 0.3 02/28/2020  1207   GFRNONAA 31 (L) 07/03/2021 2007   GFRNONAA 34 (L) 06/23/2016 1025   GFRAA 47 (L) 02/28/2020 1207   GFRAA 40 (L) 06/23/2016 1025   Lab Results  Component Value Date   CHOL 90 (L) 05/16/2020   HDL 34 (L) 05/16/2020   LDLCALC 40 05/16/2020   LDLDIRECT 60 04/02/2012   TRIG 78 05/16/2020   Lab Results  Component Value Date   HGBA1C 7.3 (A) 08/13/2021   No results found for: VITAMINB12 Lab Results  Component Value Date   TSH 0.668 04/18/2008    Depakote level 11/21: 73  Depakote level 2/22: 66   MRI Brain with and without contrast 03/20/2021 No acute intracranial process.  No evidence of brain mass or lesion   EEG 03/21/2021: This study is suggestive of mild diffuse encephalopathy, nonspecific etiology. No seizures or epileptiform discharges were seen throughout the recording.   Routine EEG 07/16/21:  This is an abnormal EEG recording in the waking and drowsy state due to diffuse slowing. Diffuse slowing is consistent with a generalized brain dysfunction such as in encephalopathy.    ASSESSMENT AND PLAN  46 y.o. year old female  with past medical history of schizophrenia, cognitive impairment, hypertension, hyperlipidemia, and diabetes mellitus who is presenting for seizure follow up. Currently stable on Depakote 2000 mg at bedtime. She still reports occasional shaking but caregiver reports that during this patient, she is awake and responsive, no tonic clonic activity. Her recent EEG was negative for epileptiform discharge. I will keep  her on Depakote 2000 mg at bedtime. Follow up in 6 months.    1. Seizure disorder ([place])   2. Schizophrenia, unspecified type [facility])     Patient Instructions  Continue with Depakote 2000 mg at bedtime  Continue with your other medications  Return in 6 months or sooner if worse    Per [facility] statutes, patients with seizures are not allowed to drive until they have been seizure-free for six months.  Other recommendations  include using caution when using heavy equipment or power tools. Avoid working on ladders or at heights. Take showers instead of baths.  Do not swim alone.  Ensure the water temperature is not too high on the home water heater. Do not go swimming alone. Do not lock yourself in a room alone (i.e. bathroom). When caring for infants or small children, sit down when holding, feeding, or changing them to minimize risk of injury to the child in the event you have a seizure. Maintain good sleep hygiene. Avoid alcohol.  Also recommend adequate sleep, hydration, good diet and minimize stress.   During the Seizure  - First, ensure adequate ventilation and place patients on the floor on their left side  Loosen clothing around the neck and ensure the airway is patent. If the patient is clenching the teeth, do not force the mouth open with any object as this can cause severe damage - Remove all items from the surrounding that can be hazardous. The patient may be oblivious to what's happening and may not even know what he or she is doing. If the patient is confused and wandering, either gently guide him/her away and block access to outside areas - Reassure the individual and be comforting - Call 911. In most cases, the seizure ends before EMS arrives. However, there are cases when seizures may last over 3 to 5 minutes. Or the individual may have developed breathing difficulties or severe injuries. If a pregnant patient or a person with diabetes develops a seizure, it is prudent to call an ambulance. - Finally, if the patient does not regain full consciousness, then call EMS. Most patients will remain confused for about 45 to 90 minutes after a seizure, so you must use judgment in calling for help. - Avoid restraints but make sure the patient is in a bed with padded side rails - Place the individual in a lateral position with the neck slightly flexed; this will help the saliva drain from the mouth and prevent the  tongue from falling backward - Remove all nearby furniture and other hazards from the area - Provide verbal assurance as the individual is regaining consciousness - Provide the patient with privacy if possible - Call for help and start treatment as ordered by the caregiver   After the Seizure (Postictal Stage)  After a seizure, most patients experience confusion, fatigue, muscle pain and/or a headache. Thus, one should permit the individual to sleep. For the next few days, reassurance is essential. Being calm and helping reorient the person is also of importance.  Most seizures are painless and end spontaneously. Seizures are not harmful to others but can lead to complications such as stress on the lungs, brain and the heart. Individuals with prior lung problems may develop labored breathing and respiratory distress.     No orders of the defined types were placed in this encounter.   Meds ordered this encounter  Medications   diazePAM, 15 MG Dose, (VALTOCO 15 MG DOSE) 2 x 7.5 MG/0.1ML  LQPK    Sig: Two 7.56m nasal spray devices, one in each nostril    Dispense:  2 each    Refill:  3    Return in about 6 months (around 04/04/2022).  I have spent a total of 30 minutes dedicated to this patient today, preparing to see patient, performing a medically appropriate examination and evaluation, ordering tests and/or medications and procedures, and counseling and educating the patient/family/caregiver; independently interpreting result and communicating results to the family/patient/caregiver; and documenting clinical information in the electronic medical record.   AAlric Ran MD 10/02/2021, 9:13 PM  [facility] [address] [place] [phone]

## 2021-10-02 NOTE — Patient Instructions (Signed)
Continue with Depakote 2000 mg at bedtime  Continue with your other medications  Return in 6 months or sooner if worse

## 2021-10-07 MED ORDER — SITAGLIPTIN PHOSPHATE 50 MG PO TABS: 50.0000 mg | ORAL_TABLET | Freq: Every day | ORAL | 1 refills | Status: DC

## 2021-10-11 NOTE — Telephone Encounter (Signed)
Called caregiver again, no answer, LVM asking her to call back to discuss this as well as other issue about urology MRI study  Leeanne Rio, MD

## 2021-10-11 NOTE — Telephone Encounter (Signed)
See other phone note, trying to reach caregiver Leeanne Rio, MD

## 2021-10-14 ENCOUNTER — Ambulatory Visit (INDEPENDENT_AMBULATORY_CARE_PROVIDER_SITE_OTHER): Payer: Medicare Other | Admitting: Family Medicine

## 2021-10-14 VITALS — BP 126/84 | HR 93 | Ht 62.0 in | Wt 124.0 lb

## 2021-10-14 DIAGNOSIS — R296 Repeated falls: Secondary | ICD-10-CM
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
## 2021-10-14 NOTE — Patient Instructions (Addendum)
It was great seeing you today!  You were seen for recent falls. I am not changing medication at this time but I do recommend discussing this at your next psychiatry appointment to see if they want to change anything.  I have also placed an order for a head CT given her recent falls to make sure theres no concern for bleeding.   Please schedule to see PCP after psychiatrist appointment next month for follow up, but if you need to be seen earlier than that for any new issues we're happy to fit you in, just give [place] a call!  Feel free to call with any questions or concerns at any time, at [phone].   Take care,  Dr. Shary Key [facility]

## 2021-10-15 ENCOUNTER — Ambulatory Visit
Admission: RE | Admit: 2021-10-15 | Discharge: 2021-10-15 | Disposition: A | Discharge status: Home / Self Care | Payer: Medicare Other | Source: Ambulatory Visit | Attending: Family Medicine | Admitting: Family Medicine

## 2021-10-15 DIAGNOSIS — R296 Repeated falls: Secondary | ICD-10-CM | POA: Insufficient documentation

## 2021-10-15 DIAGNOSIS — S0990XA Unspecified injury of head, initial encounter: Secondary | ICD-10-CM | POA: Diagnosis not present

## 2021-10-15 DIAGNOSIS — R519 Headache, unspecified: Secondary | ICD-10-CM | POA: Diagnosis not present

## 2021-10-15 NOTE — Assessment & Plan Note (Signed)
As discussed above patient presents with frequent falls in the past 2 months without symptoms of aura, dizziness, lightheadedness, abnormal body shaking.  Does have a bruise under her right eye, and the healing laceration on her forehead from these falls.  I suspect it is likely behavioral.  After discussion with the guardian it seems like she has not been wanting to go to her day program which explains her running away from the program and having a fall at that time in addition to the other falls she has been having recently.  On a number of psychiatric medication that could potentially be contributing, though less likely given that she has been stable on this medication for quite a while and without any changes in the past couple of months.  I did recommend discussing this with her psychiatrist at her next appointment in the coming month to see if they want to adjust any of her meds.  Given the bruising and the laceration on her head did order a CT head.  We will follow-up on the CT and on psychiatry's recommendations.  Recommended follow-up with PCP after she sees her psychiatrist or if there are any changes prior to then.
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
## 2021-10-17 ENCOUNTER — Ambulatory Visit: Payer: Medicare Other

## 2021-10-17 DIAGNOSIS — Z30019 Encounter for initial prescription of contraceptives, unspecified: Secondary | ICD-10-CM

## 2021-10-17 DIAGNOSIS — F70 Mild intellectual disabilities: Secondary | ICD-10-CM | POA: Diagnosis not present

## 2021-10-17 DIAGNOSIS — F250 Schizoaffective disorder, bipolar type: Secondary | ICD-10-CM | POA: Diagnosis not present

## 2021-10-17 MED ORDER — MEDROXYPROGESTERONE ACETATE 150 MG/ML IM SUSP: 150.0000 mg | Freq: Once | INTRAMUSCULAR | Status: DC

## 2021-10-17 NOTE — Progress Notes (Signed)
Patient here today for Depo Provera injection and is within her dates.     Last contraceptive appt was 08/13/2021.   Depo given in RD today per patients preference.  Site unremarkable & patient tolerated injection.     Next injection due 01/02/2022-01/16/2022.  Reminder card given.

## 2021-10-18 ENCOUNTER — Ambulatory Visit
Admission: RE | Admit: 2021-10-18 | Discharge: 2021-10-18 | Disposition: A | Discharge status: Home / Self Care | Payer: Medicare Other | Source: Ambulatory Visit | Attending: Family Medicine | Admitting: Family Medicine

## 2021-10-18 ENCOUNTER — Encounter: Payer: Self-pay | Admitting: Family Medicine

## 2021-10-18 DIAGNOSIS — Z1231 Encounter for screening mammogram for malignant neoplasm of breast: Secondary | ICD-10-CM | POA: Diagnosis not present

## 2021-10-20 ENCOUNTER — Encounter (HOSPITAL_COMMUNITY): Payer: Self-pay

## 2021-10-20 ENCOUNTER — Emergency Department (HOSPITAL_COMMUNITY): Payer: Medicare Other

## 2021-10-20 ENCOUNTER — Other Ambulatory Visit: Payer: Self-pay

## 2021-10-20 ENCOUNTER — Inpatient Hospital Stay (HOSPITAL_COMMUNITY)
Admission: EM | Admit: 2021-10-20 | Discharge: 2021-10-30 | DRG: 638 | Disposition: A | Discharge status: Home / Self Care | Payer: Medicare Other | Attending: Family Medicine | Admitting: Family Medicine

## 2021-10-20 DIAGNOSIS — N1831 Chronic kidney disease, stage 3a: Secondary | ICD-10-CM | POA: Diagnosis not present

## 2021-10-20 DIAGNOSIS — R112 Nausea with vomiting, unspecified: Secondary | ICD-10-CM | POA: Diagnosis not present

## 2021-10-20 DIAGNOSIS — E785 Hyperlipidemia, unspecified: Secondary | ICD-10-CM | POA: Diagnosis present

## 2021-10-20 DIAGNOSIS — E1129 Type 2 diabetes mellitus with other diabetic kidney complication: Secondary | ICD-10-CM | POA: Diagnosis present

## 2021-10-20 DIAGNOSIS — E11649 Type 2 diabetes mellitus with hypoglycemia without coma: Secondary | ICD-10-CM | POA: Diagnosis present

## 2021-10-20 DIAGNOSIS — Z6822 Body mass index (BMI) 22.0-22.9, adult: Secondary | ICD-10-CM

## 2021-10-20 DIAGNOSIS — E870 Hyperosmolality and hypernatremia: Secondary | ICD-10-CM | POA: Diagnosis present

## 2021-10-20 DIAGNOSIS — R339 Retention of urine, unspecified: Secondary | ICD-10-CM | POA: Diagnosis present

## 2021-10-20 DIAGNOSIS — N179 Acute kidney failure, unspecified: Secondary | ICD-10-CM | POA: Diagnosis not present

## 2021-10-20 DIAGNOSIS — R32 Unspecified urinary incontinence: Secondary | ICD-10-CM | POA: Diagnosis not present

## 2021-10-20 DIAGNOSIS — E860 Dehydration: Secondary | ICD-10-CM | POA: Diagnosis not present

## 2021-10-20 DIAGNOSIS — F79 Unspecified intellectual disabilities: Secondary | ICD-10-CM

## 2021-10-20 DIAGNOSIS — E162 Hypoglycemia, unspecified: Secondary | ICD-10-CM | POA: Diagnosis not present

## 2021-10-20 DIAGNOSIS — R197 Diarrhea, unspecified: Secondary | ICD-10-CM | POA: Diagnosis not present

## 2021-10-20 DIAGNOSIS — R296 Repeated falls: Secondary | ICD-10-CM | POA: Diagnosis present

## 2021-10-20 DIAGNOSIS — E8809 Other disorders of plasma-protein metabolism, not elsewhere classified: Secondary | ICD-10-CM | POA: Diagnosis present

## 2021-10-20 DIAGNOSIS — R531 Weakness: Principal | ICD-10-CM

## 2021-10-20 DIAGNOSIS — K5900 Constipation, unspecified: Secondary | ICD-10-CM | POA: Diagnosis present

## 2021-10-20 DIAGNOSIS — Z7982 Long term (current) use of aspirin: Secondary | ICD-10-CM | POA: Diagnosis not present

## 2021-10-20 DIAGNOSIS — N183 Chronic kidney disease, stage 3 unspecified: Secondary | ICD-10-CM | POA: Diagnosis present

## 2021-10-20 DIAGNOSIS — R4182 Altered mental status, unspecified: Secondary | ICD-10-CM | POA: Diagnosis not present

## 2021-10-20 DIAGNOSIS — R569 Unspecified convulsions: Secondary | ICD-10-CM | POA: Diagnosis present

## 2021-10-20 DIAGNOSIS — Z79899 Other long term (current) drug therapy: Secondary | ICD-10-CM | POA: Diagnosis not present

## 2021-10-20 DIAGNOSIS — K5909 Other constipation: Secondary | ICD-10-CM | POA: Diagnosis present

## 2021-10-20 DIAGNOSIS — E440 Moderate protein-calorie malnutrition: Secondary | ICD-10-CM | POA: Diagnosis present

## 2021-10-20 DIAGNOSIS — R000 Tachycardia, unspecified: Secondary | ICD-10-CM | POA: Diagnosis not present

## 2021-10-20 DIAGNOSIS — Z883 Allergy status to other anti-infective agents status: Secondary | ICD-10-CM

## 2021-10-20 DIAGNOSIS — E161 Other hypoglycemia: Secondary | ICD-10-CM | POA: Diagnosis not present

## 2021-10-20 DIAGNOSIS — T424X5A Adverse effect of benzodiazepines, initial encounter: Secondary | ICD-10-CM | POA: Diagnosis present

## 2021-10-20 DIAGNOSIS — Z7984 Long term (current) use of oral hypoglycemic drugs: Secondary | ICD-10-CM | POA: Diagnosis not present

## 2021-10-20 DIAGNOSIS — K5903 Drug induced constipation: Secondary | ICD-10-CM | POA: Diagnosis present

## 2021-10-20 DIAGNOSIS — F209 Schizophrenia, unspecified: Secondary | ICD-10-CM | POA: Diagnosis present

## 2021-10-20 DIAGNOSIS — E1122 Type 2 diabetes mellitus with diabetic chronic kidney disease: Secondary | ICD-10-CM | POA: Diagnosis present

## 2021-10-20 DIAGNOSIS — R0902 Hypoxemia: Secondary | ICD-10-CM | POA: Diagnosis not present

## 2021-10-20 DIAGNOSIS — I959 Hypotension, unspecified: Secondary | ICD-10-CM | POA: Diagnosis present

## 2021-10-20 DIAGNOSIS — R410 Disorientation, unspecified: Secondary | ICD-10-CM | POA: Diagnosis not present

## 2021-10-20 DIAGNOSIS — R109 Unspecified abdominal pain: Secondary | ICD-10-CM | POA: Diagnosis not present

## 2021-10-20 LAB — I-STAT CHEM 8, ED
BUN: 39 mg/dL — ABNORMAL HIGH (ref 6–20)
Calcium, Ion: 1 mmol/L — ABNORMAL LOW (ref 1.15–1.40)
Chloride: 111 mmol/L (ref 98–111)
Creatinine, Ser: 3 mg/dL — ABNORMAL HIGH (ref 0.44–1.00)
Glucose, Bld: 56 mg/dL — ABNORMAL LOW (ref 70–99)
HCT: 28 % — ABNORMAL LOW (ref 36.0–46.0)
Hemoglobin: 9.5 g/dL — ABNORMAL LOW (ref 12.0–15.0)
Potassium: 4 mmol/L (ref 3.5–5.1)
Sodium: 141 mmol/L (ref 135–145)
TCO2: 20 mmol/L — ABNORMAL LOW (ref 22–32)

## 2021-10-20 LAB — CBG MONITORING, ED
Glucose-Capillary: 72 mg/dL (ref 70–99)
Glucose-Capillary: 189 mg/dL — ABNORMAL HIGH (ref 70–99)
Glucose-Capillary: 94 mg/dL (ref 70–99)
Glucose-Capillary: 38 mg/dL — CL (ref 70–99)
Glucose-Capillary: 129 mg/dL — ABNORMAL HIGH (ref 70–99)
Glucose-Capillary: 155 mg/dL — ABNORMAL HIGH (ref 70–99)
Glucose-Capillary: 166 mg/dL — ABNORMAL HIGH (ref 70–99)
Glucose-Capillary: 134 mg/dL — ABNORMAL HIGH (ref 70–99)

## 2021-10-20 LAB — I-STAT VENOUS BLOOD GAS, ED
Acid-base deficit: 2 mmol/L (ref 0.0–2.0)
Bicarbonate: 21.6 mmol/L (ref 20.0–28.0)
Calcium, Ion: 1.12 mmol/L — ABNORMAL LOW (ref 1.15–1.40)
HCT: 29 % — ABNORMAL LOW (ref 36.0–46.0)
Hemoglobin: 9.9 g/dL — ABNORMAL LOW (ref 12.0–15.0)
O2 Saturation: 85 %
Potassium: 4 mmol/L (ref 3.5–5.1)
Sodium: 141 mmol/L (ref 135–145)
TCO2: 23 mmol/L (ref 22–32)
pCO2, Ven: 33.5 mmHg — ABNORMAL LOW (ref 44–60)
pH, Ven: 7.418 (ref 7.25–7.43)
pO2, Ven: 49 mmHg — ABNORMAL HIGH (ref 32–45)

## 2021-10-20 LAB — CBC
HCT: 30.1 % — ABNORMAL LOW (ref 36.0–46.0)
Hemoglobin: 9.8 g/dL — ABNORMAL LOW (ref 12.0–15.0)
MCH: 32.2 pg (ref 26.0–34.0)
MCHC: 32.6 g/dL (ref 30.0–36.0)
MCV: 99 fL (ref 80.0–100.0)
Platelets: UNDETERMINED 10*3/uL (ref 150–400)
RBC: 3.04 MIL/uL — ABNORMAL LOW (ref 3.87–5.11)
RDW: 15.3 % (ref 11.5–15.5)
WBC: 11 10*3/uL — ABNORMAL HIGH (ref 4.0–10.5)
nRBC: 0.2 % (ref 0.0–0.2)

## 2021-10-20 LAB — URINALYSIS, ROUTINE W REFLEX MICROSCOPIC
Bilirubin Urine: NEGATIVE
Glucose, UA: 150 mg/dL — AB
Hgb urine dipstick: NEGATIVE
Ketones, ur: NEGATIVE mg/dL
Leukocytes,Ua: NEGATIVE
Nitrite: NEGATIVE
Protein, ur: NEGATIVE mg/dL
Specific Gravity, Urine: 1.004 — ABNORMAL LOW (ref 1.005–1.030)
pH: 5 (ref 5.0–8.0)

## 2021-10-20 LAB — COMPREHENSIVE METABOLIC PANEL
ALT: 17 U/L (ref 0–44)
AST: 25 U/L (ref 15–41)
Albumin: 2.3 g/dL — ABNORMAL LOW (ref 3.5–5.0)
Alkaline Phosphatase: 59 U/L (ref 38–126)
Anion gap: 9 (ref 5–15)
BUN: 43 mg/dL — ABNORMAL HIGH (ref 6–20)
CO2: 21 mmol/L — ABNORMAL LOW (ref 22–32)
Calcium: 8.3 mg/dL — ABNORMAL LOW (ref 8.9–10.3)
Chloride: 110 mmol/L (ref 98–111)
Creatinine, Ser: 2.66 mg/dL — ABNORMAL HIGH (ref 0.44–1.00)
GFR, Estimated: 21 mL/min — ABNORMAL LOW (ref >60)
Glucose, Bld: 59 mg/dL — ABNORMAL LOW (ref 70–99)
Potassium: 4.7 mmol/L (ref 3.5–5.1)
Sodium: 140 mmol/L (ref 135–145)
Total Bilirubin: 0.7 mg/dL (ref 0.3–1.2)
Total Protein: 5.7 g/dL — ABNORMAL LOW (ref 6.5–8.1)

## 2021-10-20 LAB — I-STAT BETA HCG BLOOD, ED (MC, WL, AP ONLY): I-stat hCG, quantitative: <5 m[IU]/mL (ref <5)

## 2021-10-20 MED ORDER — CETAPHIL MOISTURIZING EX CREA
TOPICAL_CREAM | Freq: Every day | CUTANEOUS | Status: DC
Start: 2021-10-20 — End: 2021-10-21

## 2021-10-20 MED ORDER — ENOXAPARIN SODIUM 30 MG/0.3ML IJ SOSY
30.0000 mg | PREFILLED_SYRINGE | INTRAMUSCULAR | Status: DC
  Administered 2021-10-20 – 2021-10-29 (×10): 30 mg via SUBCUTANEOUS
  Filled 2021-10-20 (×10): qty 0.3

## 2021-10-20 MED ORDER — FAMOTIDINE 20 MG PO TABS
20.0000 mg | ORAL_TABLET | Freq: Two times a day (BID) | ORAL | Status: DC
  Administered 2021-10-20 – 2021-10-30 (×20): 20 mg via ORAL
  Filled 2021-10-20 (×20): qty 1

## 2021-10-20 MED ORDER — PAROXETINE HCL 10 MG PO TABS
10.0000 mg | ORAL_TABLET | Freq: Every day | ORAL | Status: DC
  Administered 2021-10-20 – 2021-10-30 (×11): 10 mg via ORAL
  Filled 2021-10-20 (×11): qty 1

## 2021-10-20 MED ORDER — INSULIN ASPART 100 UNIT/ML IJ SOLN: 0.0000 [IU] | Freq: Three times a day (TID) | INTRAMUSCULAR | Status: DC

## 2021-10-20 MED ORDER — ONDANSETRON HCL 4 MG/2ML IJ SOLN
4.0000 mg | Freq: Once | INTRAMUSCULAR | Status: AC
  Administered 2021-10-20: 4 mg via INTRAVENOUS
  Filled 2021-10-20: qty 2

## 2021-10-20 MED ORDER — DEXTROSE 50 % IV SOLN
25.0000 mL | Freq: Once | INTRAVENOUS | Status: DC
  Filled 2021-10-20: qty 50

## 2021-10-20 MED ORDER — ASPIRIN 81 MG PO CHEW
81.0000 mg | CHEWABLE_TABLET | Freq: Every day | ORAL | Status: DC
  Administered 2021-10-21 – 2021-10-30 (×10): 81 mg via ORAL
  Filled 2021-10-20 (×11): qty 1

## 2021-10-20 MED ORDER — DEXTROSE 50 % IV SOLN
1.0000 | Freq: Once | INTRAVENOUS | Status: AC
  Administered 2021-10-20: 50 mL via INTRAVENOUS
  Filled 2021-10-20: qty 50

## 2021-10-20 MED ORDER — DEXTROSE 50 % IV SOLN
50.0000 mL | Freq: Once | INTRAVENOUS | Status: AC
  Administered 2021-10-20: 50 mL via INTRAVENOUS

## 2021-10-20 MED ORDER — VALBENAZINE TOSYLATE 40 MG PO CAPS
80.0000 mg | ORAL_CAPSULE | Freq: Every day | ORAL | Status: DC
  Administered 2021-10-20 – 2021-10-29 (×10): 80 mg via ORAL
  Filled 2021-10-20 (×13): qty 2

## 2021-10-20 MED ORDER — SODIUM CHLORIDE 0.9 % IV BOLUS (SEPSIS)
1000.0000 mL | Freq: Once | INTRAVENOUS | Status: AC
  Administered 2021-10-20: 1000 mL via INTRAVENOUS

## 2021-10-20 MED ORDER — SODIUM CHLORIDE 0.9 % IV SOLN
1000.0000 mL | INTRAVENOUS | Status: DC
  Administered 2021-10-20 – 2021-10-21 (×2): 1000 mL via INTRAVENOUS

## 2021-10-20 MED ORDER — QUETIAPINE FUMARATE 25 MG PO TABS: 50.0000 mg | ORAL_TABLET | Freq: Every day | ORAL | Status: DC

## 2021-10-20 MED ORDER — QUETIAPINE FUMARATE 25 MG PO TABS
25.0000 mg | ORAL_TABLET | Freq: Every day | ORAL | Status: DC
  Filled 2021-10-20: qty 1

## 2021-10-20 MED ORDER — DIVALPROEX SODIUM ER 500 MG PO TB24
2000.0000 mg | ORAL_TABLET | Freq: Every day | ORAL | Status: DC
  Administered 2021-10-20 – 2021-10-29 (×10): 2000 mg via ORAL
  Filled 2021-10-20 (×13): qty 4

## 2021-10-20 MED ORDER — GLYCOPYRROLATE 1 MG PO TABS
1.0000 mg | ORAL_TABLET | Freq: Every day | ORAL | Status: DC
  Administered 2021-10-21 – 2021-10-30 (×10): 1 mg via ORAL
  Filled 2021-10-20 (×10): qty 1

## 2021-10-20 MED ORDER — DEXTROSE 5 % IV SOLN: INTRAVENOUS | Status: DC

## 2021-10-20 MED ORDER — DEXTROSE 50 % IV SOLN
INTRAVENOUS | Status: AC
  Filled 2021-10-20: qty 50

## 2021-10-20 MED ORDER — ATORVASTATIN CALCIUM 10 MG PO TABS
20.0000 mg | ORAL_TABLET | Freq: Every day | ORAL | Status: DC
  Administered 2021-10-20 – 2021-10-29 (×10): 20 mg via ORAL
  Filled 2021-10-20 (×11): qty 2

## 2021-10-20 MED ORDER — QUETIAPINE FUMARATE 25 MG PO TABS
50.0000 mg | ORAL_TABLET | Freq: Every day | ORAL | Status: DC
  Administered 2021-10-21 – 2021-10-29 (×9): 50 mg via ORAL
  Filled 2021-10-20 (×5): qty 1
  Filled 2021-10-20 (×2): qty 2
  Filled 2021-10-20 (×2): qty 1

## 2021-10-20 MED ORDER — DOCUSATE SODIUM 100 MG PO CAPS
100.0000 mg | ORAL_CAPSULE | Freq: Every day | ORAL | Status: DC
  Filled 2021-10-20: qty 1

## 2021-10-20 MED ORDER — DEXTROSE 50 % IV SOLN: 50.0000 mL | Freq: Once | INTRAVENOUS | Status: DC

## 2021-10-20 MED ORDER — CLOZAPINE 100 MG PO TABS
400.0000 mg | ORAL_TABLET | Freq: Every day | ORAL | Status: DC
  Administered 2021-10-20 – 2021-10-29 (×9): 400 mg via ORAL
  Filled 2021-10-20 (×14): qty 4

## 2021-10-20 NOTE — ED Notes (Signed)
Pt reports feeling nauseous and is sticking her finger down her throat to induce vomiting.

## 2021-10-20 NOTE — ED Triage Notes (Signed)
Patient from group home with initial glucose 50-42-58-77.  Attempted to give her food but patient has been more lethargic and not wanting to get up out of bed or eat.  Last 2 days having decrease intake, n.v and loose stools.

## 2021-10-20 NOTE — ED Notes (Signed)
Patient transported to CT 

## 2021-10-20 NOTE — ED Notes (Signed)
Pt in and out cathed, per order from Suella Broad PA. Prior to the procedure the patient voided an unmeasurable amount but approximately 300-400 cc. The patient was then in and out cathed and 700 cc was drained from her bladder. Pt states she feels better.

## 2021-10-20 NOTE — ED Provider Notes (Signed)
46yo female with hypoglycemia, n/v/d, decreased PO intake Has not had her glipizide today Lives at [facility] for intellectual disability  Fall, head CT negative. 1amp D50 with improved CBG Admit for AKI, pending abdominal CT.  Physical Exam  BP 98/75   Pulse 97   Temp 98.2 F (36.8 C) (Oral)   Resp 15   Ht 5' 2"  (1.575 m)   Wt 56.2 kg   SpO2 100%   BMI 22.68 kg/m   Physical Exam Exam conducted with a chaperone present.  Genitourinary:    Rectum: Normal.     Comments: Stool soft/brown    Procedures  .Critical Care  Performed by: Tacy Learn, PA-C Authorized by: Tacy Learn, PA-C   Critical care provider statement:    Critical care time (minutes):  30   Critical care was time spent personally by me on the following activities:  Development of treatment plan with patient or surrogate, discussions with consultants, evaluation of patient's response to treatment, examination of patient, ordering and review of laboratory studies, ordering and review of radiographic studies, ordering and performing treatments and interventions, pulse oximetry, re-evaluation of patient's condition and review of old charts   ED Course / MDM   Clinical Course as of 10/20/21 1819  Sun Oct 20, 2021  1358 Fatigued this morning. No seizure activity.  [WF]    Clinical Course User Index [WF] Tedd Sias, PA   Medical Decision Making Amount and/or Complexity of Data Reviewed Labs: ordered. Radiology: ordered.  Risk Prescription drug management. Decision regarding hospitalization.   Patient with CBG 35, given 1 amp D50 as well as juice with added sugar x 2 cups. Starting Dextrose 5% drip as well. Rectal exam with chaperone present, patient is not impacted, stool is soft and brown.  She does have a wet/saturated diaper on.  Will In-N-Out cath per CT report of significant bladder distention to 17 cm.  Plan is to consult for admission for patient's hypoglycemia as well as likely post renal  AKI with her urinary retention and large stool burden.  Staff members at bedside note patient does take MiraLAX regularly and does have regular bowel movements.  Discussed with family medicine service who will consult for admission.        Tacy Learn, PA-C 10/20/21 1819    Davonna Belling, MD 10/21/21 [phone]

## 2021-10-20 NOTE — ED Provider Notes (Signed)
[facility] EMERGENCY DEPARTMENT Provider Note   CSN: [id number] Arrival date & time: 10/20/21  1340     History  Chief Complaint  Patient presents with   Hypoglycemia    Alfhild Partch is a 46 y.o. female.   Hypoglycemia  Patient is a 46 year old female with past medical history significant for was affective disorder, uremia, obesity SVT, DM 2, cognitive impairment  Seems that over the past 2 days patient has been refusing food has been nausea some vomiting since yesterday morning no blood or bile in emesis but some dark appearing emesis.  Patient denies any pain currently.  Seems that she was brought in by EMS after she was evaluated by them and found to have a blood sugar of 50.  Blood sugars were found to be as low as 42 this morning.  She has had diarrhea as well.     Home Medications Prior to Admission medications   Medication Sig Start Date End Date Taking? Authorizing Provider  acetaminophen (GOODSENSE PAIN RELIEF EXTRA ST) 500 MG tablet Take 1 tablet (500 mg total) by mouth every 8 (eight) hours as needed for mild pain or fever. 09/20/21  Yes Leeanne Rio, MD  aspirin (GOODSENSE ASPIRIN) 81 MG chewable tablet Chew 1 tablet (81 mg total) by mouth daily. 07/25/21  Yes Leeanne Rio, MD  atorvastatin (LIPITOR) 20 MG tablet Take 1 tablet (20 mg total) by mouth at bedtime. 07/25/21  Yes Leeanne Rio, MD  cloZAPine (CLOZARIL) 100 MG tablet Take 4 tablets (400 mg total) by mouth at bedtime for 7 days. 04/01/21  Yes Blanchie Dessert, MD  divalproex (DEPAKOTE ER) 500 MG 24 hr tablet Take 2,000 mg by mouth at bedtime.   Yes [provider]  docusate sodium (COLACE) 100 MG capsule TAKE ONE CAPSULE EACH DAY 10/01/21  Yes Leeanne Rio, MD  Emollient (CETAPHIL) cream APPLY DAILY Patient taking differently: 1 application  daily. 09/26/21  Yes Leeanne Rio, MD  famotidine (PEPCID) 20 MG tablet Take 1 tablet (20 mg total) by  mouth 2 (two) times daily. 09/05/21  Yes Leeanne Rio, MD  glipiZIDE (GLUCOTROL) 10 MG tablet TAKE ONE TABLET TWICE DAILY BEFORE MEALS 06/17/21  Yes Pray, Norwood Levo, MD  glycopyrrolate (ROBINUL) 1 MG tablet Take 1 mg by mouth daily.   Yes [provider]  INGREZZA 80 MG capsule Take 80 mg by mouth at bedtime. 09/25/21  Yes [provider]  JARDIANCE 10 MG TABS tablet TAKE ONE TABLET BY MOUTH ONCE DAILY 09/20/21  Yes Leeanne Rio, MD  medroxyPROGESTERone (DEPO-PROVERA) 150 MG/ML injection Inject 150 mg into the muscle every 3 (three) months.   Yes [provider]  metoprolol succinate (TOPROL-XL) 25 MG 24 hr tablet TAKE ONE TABLET DAILY 06/10/21  Yes Hensel, Jamal Collin, MD  PARoxetine (PAXIL) 10 MG tablet Take 10 mg by mouth daily.   Yes [provider]  polyethylene glycol powder (GLYCOLAX/MIRALAX) 17 GM/SCOOP powder Take 17 g by mouth at bedtime. 07/10/21  Yes Dickie La, MD  QUEtiapine (SEROQUEL) 50 MG tablet Take 50 mg by mouth at bedtime. 10/01/21  Yes [provider]  simethicone (GAS-X EXTRA STRENGTH) 125 MG chewable tablet Chew 1 tablet (125 mg total) by mouth daily as needed for flatulence. 09/20/21  Yes Leeanne Rio, MD  sitaGLIPtin (JANUVIA) 50 MG tablet Take 1 tablet (50 mg total) by mouth daily. 10/07/21  Yes Leeanne Rio, MD  Blood Glucose Monitoring Suppl (  ONETOUCH VERIO) w/Device KIT Check blood sugar once daily 03/27/20   Leeanne Rio, MD  diazePAM, 15 MG Dose, (VALTOCO 15 MG DOSE) 2 x 7.5 MG/0.1ML LQPK Two 7.73m nasal spray devices, one in each nostril Patient not taking: Reported on 10/20/2021 10/02/21   CAlric Ran MD  divalproex (DEPAKOTE ER) 500 MG 24 hr tablet Take 4 tablets (2,000 mg total) by mouth daily. 07/09/21 10/07/21  CAlric Ran MD  glucose blood (ONETOUCH VERIO) test strip CHECK BLOOD GLUCOSE (SUGAR) EVERY DAY 06/10/21   HZenia Resides MD  Incontinence Supply Disposable (INCONTINENCE  BRIEF MEDIUM) MISC Wear every night 05/27/19   MLeeanne Rio MD  Lancet Devices (ONE TOUCH DELICA LANCING DEV) MISC Check blood sugar once daily 03/27/20   MLeeanne Rio MD  OneTouch Delica Lancets 358IMISC Check blood sugar once daily 03/27/20   MLeeanne Rio MD      Allergies    Minocycline    Review of Systems   Review of Systems  Physical Exam Updated Vital Signs BP 93/62   Pulse 100   Temp 98.2 F (36.8 C) (Oral)   Resp 12   Ht 5' 2" (1.575 m)   Wt 56.2 kg   SpO2 100%   BMI 22.68 kg/m  Physical Exam Vitals and nursing note reviewed.  Constitutional:      General: She is not in acute distress.    Appearance: She is ill-appearing.  HENT:     Head: Normocephalic and atraumatic.     Nose: Nose normal.  Eyes:     General: No scleral icterus. Cardiovascular:     Rate and Rhythm: Regular rhythm. Tachycardia present.     Pulses: Normal pulses.     Heart sounds: Normal heart sounds.  Pulmonary:     Effort: Pulmonary effort is normal. No respiratory distress.     Breath sounds: No wheezing.  Abdominal:     Palpations: Abdomen is soft.     Tenderness: There is no abdominal tenderness. There is no guarding or rebound.     Comments: Somewhat distended abdomen.  Musculoskeletal:     Cervical back: Normal range of motion.     Right lower leg: No edema.     Left lower leg: No edema.  Skin:    General: Skin is warm and dry.     Capillary Refill: Capillary refill takes less than 2 seconds.  Neurological:     Mental Status: She is alert. Mental status is at baseline.  Psychiatric:        Mood and Affect: Mood normal.        Behavior: Behavior normal.     ED Results / Procedures / Treatments   Labs (all labs ordered are listed, but only abnormal results are displayed) Labs Reviewed  COMPREHENSIVE METABOLIC PANEL - Abnormal; Notable for the following components:      Result Value   CO2 21 (*)    Glucose, Bld 59 (*)    BUN 43 (*)     Creatinine, Ser 2.66 (*)    Calcium 8.3 (*)    Total Protein 5.7 (*)    Albumin 2.3 (*)    GFR, Estimated 21 (*)    All other components within normal limits  CBC - Abnormal; Notable for the following components:   WBC 11.0 (*)    RBC 3.04 (*)    Hemoglobin 9.8 (*)    HCT 30.1 (*)    All other components within normal limits  I-STAT CHEM 8, ED - Abnormal; Notable for the following components:   BUN 39 (*)    Creatinine, Ser 3.00 (*)    Glucose, Bld 56 (*)    Calcium, Ion 1.00 (*)    TCO2 20 (*)    Hemoglobin 9.5 (*)    HCT 28.0 (*)    All other components within normal limits  I-STAT VENOUS BLOOD GAS, ED - Abnormal; Notable for the following components:   pCO2, Ven 33.5 (*)    pO2, Ven 49 (*)    Calcium, Ion 1.12 (*)    HCT 29.0 (*)    Hemoglobin 9.9 (*)    All other components within normal limits  CBG MONITORING, ED - Abnormal; Notable for the following components:   Glucose-Capillary 189 (*)    All other components within normal limits  URINALYSIS, ROUTINE W REFLEX MICROSCOPIC  BLOOD GAS, VENOUS  CBG MONITORING, ED  I-STAT BETA HCG BLOOD, ED ([facility], [facility], [facility] ONLY)    EKG EKG Interpretation  Date/Time:  Sunday October 20 2021 13:49:22 EDT Ventricular Rate:  105 PR Interval:  113 QRS Duration: 80 QT Interval:  312 QTC Calculation: 413 R Axis:   19 Text Interpretation: Sinus tachycardia Low voltage, extremity leads Confirmed by Regan Lemming ([id number]) on 10/20/2021 2:37:53 PM  Radiology CT HEAD WO CONTRAST (5MM)  Result Date: 10/20/2021 CLINICAL DATA:  Delirium EXAM: CT HEAD WITHOUT CONTRAST TECHNIQUE: Contiguous axial images were obtained from the base of the skull through the vertex without intravenous contrast. RADIATION DOSE REDUCTION: This exam was performed according to the departmental dose-optimization program which includes automated exposure control, adjustment of the mA and/or kV according to patient size and/or use of iterative reconstruction technique. COMPARISON:   Head CT 10/15/2021. FINDINGS: Brain: No evidence of acute intracranial hemorrhage or extra-axial collection.No evidence of mass lesion/concerning mass effect.The ventricles are normal in size.Minimal scattered subcortical and periventricular white matter hypodensities, nonspecific but likely sequela of chronic small vessel ischemic disease.Moderate cerebral atrophy Vascular: No hyperdense vessel or unexpected calcification. Skull: Hyperostosis frontalis internus. Negative for skull fracture. Sinuses/Orbits: Mild paranasal sinus mucosal thickening. Mastoid air cells are clear. Orbits are unremarkable. Other: None. IMPRESSION: No acute intracranial abnormality. Electronically Signed   By: Maurine Simmering M.D.   On: 10/20/2021 14:30   DG Chest 2 View  Result Date: 10/20/2021 CLINICAL DATA:  Altered mental status EXAM: CHEST - 2 VIEW COMPARISON:  07/16/2016 FINDINGS: The heart size and mediastinal contours are within normal limits. Both lungs are clear. The visualized skeletal structures are unremarkable. IMPRESSION: No acute abnormality of the lungs. Electronically Signed   By: Delanna Ahmadi M.D.   On: 10/20/2021 14:29    Procedures Procedures    Medications Ordered in ED Medications  sodium chloride 0.9 % bolus 1,000 mL (1,000 mLs Intravenous New Bag/Given 10/20/21 1435)    Followed by  0.9 %  sodium chloride infusion (1,000 mLs Intravenous New Bag/Given 10/20/21 1436)  dextrose 50 % solution 50 mL (50 mLs Intravenous Given 10/20/21 1438)    ED Course/ Medical Decision Making/ A&P Clinical Course as of 10/20/21 1523  Sun Oct 20, 2021  1358 Fatigued this morning. No seizure activity.  [WF]    Clinical Course User Index [WF] Tedd Sias, PA                           Medical Decision Making Amount and/or Complexity of Data Reviewed Labs: ordered. Radiology: ordered.  Risk  Prescription drug management.   This patient presents to the ED for concern of fatigue, nausea vomiting diarrhea,  this involves a number of treatment options, and is a complaint that carries with it a moderate to high risk of complications and morbidity.  The differential diagnosis includes The differential diagnosis of weakness includes but is not limited to neurologic causes (GBS, myasthenia gravis, CVA, MS, ALS, transverse myelitis, spinal cord injury, CVA, botulism, ) and other causes: ACS, Arrhythmia, syncope, orthostatic hypotension, sepsis, hypoglycemia, electrolyte disturbance, hypothyroidism, respiratory failure, symptomatic anemia, dehydration, heat injury, polypharmacy, malignancy.    Co morbidities: Discussed in HPI   Brief History:  Patient is a 46 year old female with past medical history significant for was affective disorder, uremia, obesity SVT, DM 2, cognitive impairment  Seems that over the past 2 days patient has been refusing food has been nausea some vomiting since yesterday morning no blood or bile in emesis but some dark appearing emesis.  Patient denies any pain currently.  Seems that she was brought in by EMS after she was evaluated by them and found to have a blood sugar of 50.  Blood sugars were found to be as low as 42 this morning.  She has had diarrhea as well.   Physical exam notable for ill appearance, dry oral mucosa, tachycardia   EMR reviewed including pt PMHx, past surgical history and past visits to ER.   See HPI for more details   Lab Tests:   I ordered and independently interpreted labs. Labs notable for CBC with mild leukocytosis anemia of 9.8 seems that this not terribly far from her baseline which is 10 I-STAT VBG obtained no significant acidosis or alkalosis.  Blood sugar at 3:06 PM is 189.  SHBG negative for pregnancy.  CMP notable for elevated BUN of 39 in the setting of elevated creatinine which has increased from a baseline of 2 to now 3.  Imaging Studies:  NAD. I personally reviewed all imaging studies and no acute abnormality found. I agree with  radiology interpretation.  I personally viewed CT head and plain x-ray of chest I agree with radiology read no acute abnormality  Cardiac Monitoring:  The patient was maintained on a cardiac monitor.  I personally viewed and interpreted the cardiac monitored which showed an underlying rhythm of: NSR EKG non-ischemic   Medicines ordered:  I ordered medication including 1 amp of D50, 1 L normal saline plus maintenance fluids ordered for AKI, dehydration, nausea vomiting diarrhea Reevaluation of the patient after these medicines showed that the patient improved I have reviewed the patients home medicines and have made adjustments as needed   Critical Interventions:     Consults/Attending Physician   I discussed this case with my attending physician who cosigned this note including patient's presenting symptoms, physical exam, and planned diagnostics and interventions. Attending physician stated agreement with plan or made changes to plan which were implemented.   Reevaluation:  After the interventions noted above I re-evaluated patient and found that they have :stayed the same   Social Determinants of Health:      Problem List / ED Course:  NVD - could be viral  - CT AP pending - w./o contrast d/t AKI AKI - likely 2/2 volume loss from above issue. Tachycardia improving w hydration Hypoglycemia - given 1amp D50 w improvement to 189   Dispostion:  3:40 PM Care of pt transferred to [facility] and Dr. Alvino Chapel at the end of my shift as the patient will require  reassessment once labs/imaging have resulted. Patient presentation, ED course, and plan of care discussed with review of all pertinent labs and imaging. Please see his/her note for further details regarding further ED course and disposition. Plan at time of handoff is admission for AKI and recurrent hypoglycemia in the setting of multiple oral meds given this AM w poor intake. This may be altered or completely changed  at the discretion of the oncoming team pending results of further workup.     Final Clinical Impression(s) / ED Diagnoses Final diagnoses:  Weakness  Hypoglycemia  Dehydration  Nausea vomiting and diarrhea    Rx / DC Orders ED Discharge Orders     None         Tedd Sias, [place] 10/20/21 1540    Regan Lemming, MD 10/20/21 1550
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
## 2021-10-21 DIAGNOSIS — E11649 Type 2 diabetes mellitus with hypoglycemia without coma: Secondary | ICD-10-CM | POA: Diagnosis present

## 2021-10-21 DIAGNOSIS — R112 Nausea with vomiting, unspecified: Secondary | ICD-10-CM

## 2021-10-21 DIAGNOSIS — K5909 Other constipation: Secondary | ICD-10-CM | POA: Diagnosis not present

## 2021-10-21 DIAGNOSIS — R32 Unspecified urinary incontinence: Secondary | ICD-10-CM | POA: Diagnosis not present

## 2021-10-21 DIAGNOSIS — E1169 Type 2 diabetes mellitus with other specified complication: Secondary | ICD-10-CM | POA: Insufficient documentation

## 2021-10-21 DIAGNOSIS — M519 Unspecified thoracic, thoracolumbar and lumbosacral intervertebral disc disorder: Secondary | ICD-10-CM | POA: Insufficient documentation

## 2021-10-21 DIAGNOSIS — E8809 Other disorders of plasma-protein metabolism, not elsewhere classified: Secondary | ICD-10-CM | POA: Diagnosis present

## 2021-10-21 DIAGNOSIS — R197 Diarrhea, unspecified: Secondary | ICD-10-CM

## 2021-10-21 DIAGNOSIS — F79 Unspecified intellectual disabilities: Secondary | ICD-10-CM | POA: Diagnosis present

## 2021-10-21 DIAGNOSIS — R339 Retention of urine, unspecified: Secondary | ICD-10-CM | POA: Insufficient documentation

## 2021-10-21 DIAGNOSIS — K5900 Constipation, unspecified: Secondary | ICD-10-CM

## 2021-10-21 DIAGNOSIS — F209 Schizophrenia, unspecified: Secondary | ICD-10-CM | POA: Diagnosis present

## 2021-10-21 DIAGNOSIS — R531 Weakness: Secondary | ICD-10-CM | POA: Diagnosis not present

## 2021-10-21 DIAGNOSIS — N183 Chronic kidney disease, stage 3 unspecified: Secondary | ICD-10-CM | POA: Diagnosis present

## 2021-10-21 DIAGNOSIS — E860 Dehydration: Secondary | ICD-10-CM | POA: Insufficient documentation

## 2021-10-21 DIAGNOSIS — R569 Unspecified convulsions: Secondary | ICD-10-CM | POA: Diagnosis present

## 2021-10-21 DIAGNOSIS — E785 Hyperlipidemia, unspecified: Secondary | ICD-10-CM | POA: Insufficient documentation

## 2021-10-21 DIAGNOSIS — N179 Acute kidney failure, unspecified: Secondary | ICD-10-CM | POA: Diagnosis present

## 2021-10-21 DIAGNOSIS — Z883 Allergy status to other anti-infective agents status: Secondary | ICD-10-CM | POA: Diagnosis not present

## 2021-10-21 DIAGNOSIS — Z7982 Long term (current) use of aspirin: Secondary | ICD-10-CM | POA: Diagnosis not present

## 2021-10-21 DIAGNOSIS — N1831 Chronic kidney disease, stage 3a: Secondary | ICD-10-CM | POA: Diagnosis not present

## 2021-10-21 DIAGNOSIS — Z79899 Other long term (current) drug therapy: Secondary | ICD-10-CM | POA: Diagnosis not present

## 2021-10-21 DIAGNOSIS — E870 Hyperosmolality and hypernatremia: Secondary | ICD-10-CM | POA: Diagnosis present

## 2021-10-21 DIAGNOSIS — T424X5A Adverse effect of benzodiazepines, initial encounter: Secondary | ICD-10-CM | POA: Diagnosis present

## 2021-10-21 DIAGNOSIS — K5903 Drug induced constipation: Secondary | ICD-10-CM | POA: Diagnosis present

## 2021-10-21 DIAGNOSIS — R296 Repeated falls: Secondary | ICD-10-CM

## 2021-10-21 DIAGNOSIS — I959 Hypotension, unspecified: Secondary | ICD-10-CM | POA: Diagnosis present

## 2021-10-21 DIAGNOSIS — G319 Degenerative disease of nervous system, unspecified: Secondary | ICD-10-CM | POA: Insufficient documentation

## 2021-10-21 DIAGNOSIS — E162 Hypoglycemia, unspecified: Secondary | ICD-10-CM

## 2021-10-21 DIAGNOSIS — E1122 Type 2 diabetes mellitus with diabetic chronic kidney disease: Secondary | ICD-10-CM | POA: Diagnosis present

## 2021-10-21 DIAGNOSIS — Z7984 Long term (current) use of oral hypoglycemic drugs: Secondary | ICD-10-CM | POA: Diagnosis not present

## 2021-10-21 DIAGNOSIS — Z6822 Body mass index (BMI) 22.0-22.9, adult: Secondary | ICD-10-CM | POA: Diagnosis not present

## 2021-10-21 DIAGNOSIS — E440 Moderate protein-calorie malnutrition: Secondary | ICD-10-CM | POA: Diagnosis present

## 2021-10-21 LAB — COMPREHENSIVE METABOLIC PANEL
ALT: 12 U/L (ref 0–44)
AST: 19 U/L (ref 15–41)
Albumin: 2 g/dL — ABNORMAL LOW (ref 3.5–5.0)
Alkaline Phosphatase: 55 U/L (ref 38–126)
Anion gap: 5 (ref 5–15)
BUN: 27 mg/dL — ABNORMAL HIGH (ref 6–20)
CO2: 21 mmol/L — ABNORMAL LOW (ref 22–32)
Calcium: 7.7 mg/dL — ABNORMAL LOW (ref 8.9–10.3)
Chloride: 120 mmol/L — ABNORMAL HIGH (ref 98–111)
Creatinine, Ser: 2.31 mg/dL — ABNORMAL HIGH (ref 0.44–1.00)
GFR, Estimated: 25 mL/min — ABNORMAL LOW (ref >60)
Glucose, Bld: 79 mg/dL (ref 70–99)
Potassium: 4.3 mmol/L (ref 3.5–5.1)
Sodium: 146 mmol/L — ABNORMAL HIGH (ref 135–145)
Total Bilirubin: 0.8 mg/dL (ref 0.3–1.2)
Total Protein: 4.8 g/dL — ABNORMAL LOW (ref 6.5–8.1)

## 2021-10-21 LAB — CBC WITH DIFFERENTIAL/PLATELET
Abs Immature Granulocytes: 0.51 10*3/uL — ABNORMAL HIGH (ref 0.00–0.07)
Basophils Absolute: 0 10*3/uL (ref 0.0–0.1)
Basophils Relative: 0 %
Eosinophils Absolute: 0.1 10*3/uL (ref 0.0–0.5)
Eosinophils Relative: 1 %
HCT: 27.5 % — ABNORMAL LOW (ref 36.0–46.0)
Hemoglobin: 9 g/dL — ABNORMAL LOW (ref 12.0–15.0)
Immature Granulocytes: 5 %
Lymphocytes Relative: 16 %
Lymphs Abs: 1.5 10*3/uL (ref 0.7–4.0)
MCH: 32.7 pg (ref 26.0–34.0)
MCHC: 32.7 g/dL (ref 30.0–36.0)
MCV: 100 fL (ref 80.0–100.0)
Monocytes Absolute: 1.1 10*3/uL — ABNORMAL HIGH (ref 0.1–1.0)
Monocytes Relative: 12 %
Neutro Abs: 6.3 10*3/uL (ref 1.7–7.7)
Neutrophils Relative %: 66 %
Platelets: 97 10*3/uL — ABNORMAL LOW (ref 150–400)
RBC: 2.75 MIL/uL — ABNORMAL LOW (ref 3.87–5.11)
RDW: 15.7 % — ABNORMAL HIGH (ref 11.5–15.5)
WBC: 9.5 10*3/uL (ref 4.0–10.5)
nRBC: 0 % (ref 0.0–0.2)

## 2021-10-21 LAB — BASIC METABOLIC PANEL
Anion gap: 7 (ref 5–15)
BUN: 24 mg/dL — ABNORMAL HIGH (ref 6–20)
CO2: 18 mmol/L — ABNORMAL LOW (ref 22–32)
Calcium: 8.1 mg/dL — ABNORMAL LOW (ref 8.9–10.3)
Chloride: 122 mmol/L — ABNORMAL HIGH (ref 98–111)
Creatinine, Ser: 2.2 mg/dL — ABNORMAL HIGH (ref 0.44–1.00)
GFR, Estimated: 27 mL/min — ABNORMAL LOW (ref >60)
Glucose, Bld: 192 mg/dL — ABNORMAL HIGH (ref 70–99)
Potassium: 4.5 mmol/L (ref 3.5–5.1)
Sodium: 147 mmol/L — ABNORMAL HIGH (ref 135–145)

## 2021-10-21 LAB — LIPASE, BLOOD: Lipase: 21 U/L (ref 11–51)

## 2021-10-21 LAB — CBG MONITORING, ED
Glucose-Capillary: 112 mg/dL — ABNORMAL HIGH (ref 70–99)
Glucose-Capillary: 117 mg/dL — ABNORMAL HIGH (ref 70–99)
Glucose-Capillary: 134 mg/dL — ABNORMAL HIGH (ref 70–99)
Glucose-Capillary: 258 mg/dL — ABNORMAL HIGH (ref 70–99)
Glucose-Capillary: 262 mg/dL — ABNORMAL HIGH (ref 70–99)
Glucose-Capillary: 59 mg/dL — ABNORMAL LOW (ref 70–99)
Glucose-Capillary: 79 mg/dL (ref 70–99)

## 2021-10-21 LAB — GLUCOSE, CAPILLARY: Glucose-Capillary: 121 mg/dL — ABNORMAL HIGH (ref 70–99)

## 2021-10-21 MED ORDER — POLYETHYLENE GLYCOL 3350 17 G PO PACK: 17.0000 g | PACK | Freq: Every day | ORAL | Status: DC

## 2021-10-21 MED ORDER — CETAPHIL MOISTURIZING EX CREA: TOPICAL_CREAM | Freq: Every day | CUTANEOUS | Status: DC

## 2021-10-21 MED ORDER — CETAPHIL MOISTURIZING EX LOTN
TOPICAL_LOTION | Freq: Every day | CUTANEOUS | Status: DC
Start: 2021-10-21 — End: 2021-10-30
  Administered 2021-10-28: 1 via TOPICAL
  Filled 2021-10-21 (×2): qty 473

## 2021-10-21 MED ORDER — MILK AND MOLASSES ENEMA: 1.0000 | Freq: Once | RECTAL | Status: DC

## 2021-10-21 MED ORDER — ONDANSETRON HCL 4 MG/2ML IJ SOLN
4.0000 mg | Freq: Four times a day (QID) | INTRAMUSCULAR | Status: DC | PRN
  Administered 2021-10-21 – 2021-10-26 (×4): 4 mg via INTRAVENOUS
  Filled 2021-10-21 (×4): qty 2

## 2021-10-21 MED ORDER — SODIUM CHLORIDE 0.45 % IV SOLN: INTRAVENOUS | Status: DC

## 2021-10-21 MED ORDER — DEXTROSE 10 % IV SOLN: INTRAVENOUS | Status: DC

## 2021-10-21 MED ORDER — MILK AND MOLASSES ENEMA: 1.0000 | Freq: Once | RECTAL | Status: DC | PRN

## 2021-10-21 MED ORDER — SENNOSIDES-DOCUSATE SODIUM 8.6-50 MG PO TABS
2.0000 | ORAL_TABLET | Freq: Two times a day (BID) | ORAL | Status: DC
  Administered 2021-10-21 – 2021-10-24 (×7): 2 via ORAL
  Filled 2021-10-21 (×7): qty 2

## 2021-10-21 MED ORDER — POLYETHYLENE GLYCOL 3350 17 G PO PACK
17.0000 g | PACK | Freq: Two times a day (BID) | ORAL | Status: DC
  Administered 2021-10-21 – 2021-10-24 (×7): 17 g via ORAL
  Filled 2021-10-21 (×7): qty 1

## 2021-10-21 MED ORDER — DOCUSATE SODIUM 100 MG PO CAPS: 100.0000 mg | ORAL_CAPSULE | Freq: Every day | ORAL | Status: DC

## 2021-10-21 MED ORDER — SODIUM BICARBONATE 8.4 % IV SOLN
INTRAVENOUS | Status: DC
  Filled 2021-10-21: qty 1000

## 2021-10-21 NOTE — ED Notes (Signed)
Patient started yelling out she need to throw up.  Patient vomited and then stuck her finger down her throat to vomit more.  MD notified for nausea meds

## 2021-10-21 NOTE — ED Notes (Signed)
Patient refusing to take any oral meds.  CBG 258 will stop giving juices and soda at this time.  Patient complaining of stomach hurting. MD notified.

## 2021-10-21 NOTE — Progress Notes (Signed)
Daily Progress Note Intern Pager: [phone]  Patient name: [name] Medical record number: [id number] Date of birth: 05-Nov-1975 Age: 46 y.o. Gender: female  Primary Care Provider: Leeanne Rio, MD Consultants: None Code Status: Full  Pt Overview and Major Events to Date:  6/11: Admitted  Assessment and Plan:  [name] is a 46 year old female presenting with hypoglycemia in the setting of N/V/D 2/2 gastroenteritis and still receiving antihyperglycemics despite poor p.o intake. Pertinent PMH/PSH includes intellectual delay, T2DM, schizophrenia, CKD stage III, seizures, HLD.   * Hypoglycemia Patient with type 2 diabetes, who presented with hypoglycemia, after 2 days of decreased PO intake 2/2 vomiting and diarrhea for 2 days PTA as well as continued antihyperglycemic therapy. Home medications: Jardiance 10, Januvia 50, Glipizide 10 mg. Patient with emetic episode this morning, in which she began vomiting and put fingers down throat to vomit more.  Patient likely has gastroenteritis, given hx and symptoms. Will hold DM meds.  -Continue to Hold DM meds -Continue CBG monitoring - IVF adjusted to half NS at 100 mL/h x 24 hours for free water deficit of 1.2 L.  Type 2 diabetes mellitus with renal manifestations (HCC) Home meds include: Jardiance 10 mg, Januvia 50 mg, glipizide 10 mg.  -Hold home meds for concerns for hypoglycemia  AKI (acute kidney injury) ([place]) Patient with history of CKD III, baseline Cr 1.7-1.9. Cr on admission 3.00, 2.31 this a.m. AKI likely pre-renal in setting of poor PO intake, and vomiting. Will continue to monitor -AM BMP -mIVF @ 100 ml/hr  Recurrent falls Patient recently seen in clinic for recurrent falls over the last 2 months without symptoms of aura, dizziness, lightheadedness.  Discussion with the guardian at that time, reports that these falls may be secondary to behavioral concerns as patient was not wanting to participate in day  program.  Current care provider, Lanier Ensign, reports that patient had recent fall preceded by shaking, with no loss of consciousness.  Patient fell onto the ground hit head on stair and was crying.  Care provider reports that sometimes patient shakes to get attention, or for behavioral issues.  Etiology of these falls unknown at this time but may be secondary to behavioral concerns. CT head negative -Continue to monitor  CONSTIPATION, CHRONIC Patient taking colace 100 mg daily and miralax 17 g daily, large stool burden noted on CT abdomen on admission.  This a.m., patient with reported medium sized bowel movement. -Increased dose of home meds to MiraLAX twice daily, and switched to Senokot-S 2 tablets twice daily.  Urinary retention-resolved as of 10/21/2021  CT report of significant bladder distention to 17 cm, patient I/O cathed, with 700 cc UOP. Patient psychiatric medications (Ingrezza, robinul, clozapine) have anticholinergic effects and could be causing the retention.  Patient with urinary incontinence this a.m., and postvoid bladder scan 60 cc. -Continue to monitor -We will discontinue bladder scans at this time unless concern for retention again arises   Chronic conditions for which home meds continue: Seizures-continue home Depakote 2000 mg nightly HLD-continue home Crestor 20 mg daily Schizophrenia-continue home Seroquel 50 mg nightly, Clozaril 400 mg nightly, Ingrezza 80 mg nightly, and paroxetine 10 mg daily    FEN/GI: Carb modified PPx: Lovenox Dispo: Pending  pending clinical improvement . Barriers include clinical improvement.   Subjective:  This AM, patient with hypoglycemia to 79.  Given breakfast as well as Coke to drink, and CBG increased to 192.  Patient reported no symptoms at this time, and was  observed eating appropriately.  She denies acute concerns or complaints today.  RN advised that patient was complaining of abdominal pain and nausea, and then began  vomiting and put fingers down her throat while vomiting to induce more vomiting.  At the time, patient was refusing p.o. medications.  She soon after took her MiraLAX and Senokot, and had a bowel movement.  Objective: Temp:  [98.2 F (36.8 C)-99.4 F (37.4 C)] 99.4 F (37.4 C) (06/12 1109) Pulse Rate:  [97-117] 115 (06/12 1115) Resp:  [9-24] 20 (06/12 1115) BP: (85-120)/(59-88) 100/88 (06/12 1115) SpO2:  [90 %-100 %] 100 % (06/12 1115) Weight:  [124 lb (56.2 kg)] 124 lb (56.2 kg) (06/11 1346) Physical Exam: General: Sitting up in bed eating breakfast, cooperative, brighter. Cardiovascular: RRR, no murmur/rub/gallops Respiratory: CTA x2.  Normal WOB on room air Abdomen: Soft, NT/ND Extremities: Nonedematous bilateral lower extremities Psych: Upbeat mood and affect while eating  Laboratory: Most recent CBC Lab Results  Component Value Date   WBC 9.5 10/21/2021   HGB 9.0 (L) 10/21/2021   HCT 27.5 (L) 10/21/2021   MCV 100.0 10/21/2021   PLT 97 (L) 10/21/2021   Most recent BMP    Latest Ref Rng & Units 10/21/2021    8:53 AM  BMP  Glucose 70 - 99 mg/dL 192   BUN 6 - 20 mg/dL 24   Creatinine 0.44 - 1.00 mg/dL 2.20   Sodium 135 - 145 mmol/L 147   Potassium 3.5 - 5.1 mmol/L 4.5   Chloride 98 - 111 mmol/L 122   CO2 22 - 32 mmol/L 18   Calcium 8.9 - 10.3 mg/dL 8.1       Imaging/Diagnostic Tests: No new images to review  Rosezetta Schlatter, MD 10/21/2021, 1:45 PM  PGY-1, [place] Intern pager: [phone], text pages welcome Secure chat group [place]

## 2021-10-21 NOTE — Assessment & Plan Note (Deleted)
Patient with history of CKD III, baseline Cr 1.7-1.9. Cr on admission 3.00, improved to 2.09 on last day of blood check 06/26/2021

## 2021-10-21 NOTE — Assessment & Plan Note (Deleted)
Patient with history of schizophrenia. Home meds: Seroqel 50 mg qhs, Clozapine 400 mg qhs, Ingrezza 80 mg qhs, Depakote ER 2,000 mg qhs, Paroxetine 10 mg daily. -continue home meds

## 2021-10-21 NOTE — ED Notes (Signed)
Patient took meds when caregiver came.  Meds had already been scanned so had to override to readmin.   Linens changed.  Patient complaining of stomach pain and needing to throw up and poop but can't poop.

## 2021-10-21 NOTE — ED Notes (Signed)
[place] Caster of [place] patients guardian [phone] requesting an update/has a few questions about patient

## 2021-10-21 NOTE — Assessment & Plan Note (Addendum)
Chronic constipation. S/p enema. -Senna and MiraLAX daily

## 2021-10-21 NOTE — Assessment & Plan Note (Addendum)
Previously resolved hypoglycemia.  Patient noted to have CBG of 256. -Continue home Jardiance 10 mg daily -Continue home Tradjenta 5 mg daily -We will add glipizide 2.5 mg twice daily

## 2021-10-21 NOTE — Assessment & Plan Note (Deleted)
Patient with hx of CKD III. Baseline Cr range 1.7-1.9, Cr on admission 3.00 -mIVF at 125 cc/hr -AM BMP

## 2021-10-21 NOTE — ED Notes (Signed)
Handoff given to next RN. Notified of patient monitoring d/t hypotension and hypoglycemia and to update provider with any new information

## 2021-10-21 NOTE — Assessment & Plan Note (Deleted)
Patient recently seen in clinic for recurrent falls over the last 2 months without symptoms of aura, dizziness, lightheadedness.  Discussion with the guardian at that time, reports that these falls may be secondary to behavioral concerns as patient was not wanting to participate in day program.  Current care provider, Lanier Ensign, reports that patient had recent fall preceded by shaking, with no loss of consciousness.  Patient fell onto the ground hit head on stair and was crying.  Care provider reports that sometimes patient shakes to get attention, or for behavioral issues.  Etiology of these falls unknown at this time but may be secondary to behavioral concerns. CT head negative -Continue to monitor

## 2021-10-21 NOTE — ED Notes (Signed)
Patient has had numerous juices, 2 cokes and ate most of breakfast.

## 2021-10-21 NOTE — Assessment & Plan Note (Deleted)
Patientwith type 2 diabetes, whopresentedwith hypoglycemia, after 2 days of decreased PO intake2/2 vomiting and diarrheafor 2 days prior to admission as well as continued antihyperglycemic therapy. Home medications:Jardiance 10, Januvia 50, Glipizide 10 mg.  Symptoms likely due to obstipation versusgastroenteritis, given hx and symptoms.  Held DM meds throughout admission, and hypoglycemia resolved after patient relieved significant stool burden seen on CT abdomen, which also resolved vomiting.  Discontinued glipizide due to hypoglycemic AE, resume Jardiance, and switch Januvia to Tradjenta 5 mg daily, as Januvia renally cleared.  AM CBG 170. - Will continue daily CBG monitoring while patient remains in the hospital -Continue home [place]

## 2021-10-21 NOTE — Assessment & Plan Note (Deleted)
Patient with hx of HLD. Last lipid panel 05/16/2020, with total cholesterol 90, HDL 34, LDL 40. Patient taking Crestor 20 mg daily. -continue home meds

## 2021-10-21 NOTE — Assessment & Plan Note (Deleted)
Stable. Home meds: Depakote 2,000 mg qhs -continue home meds

## 2021-10-21 NOTE — ED Notes (Signed)
Family medicine resident contacted and notified of  CBG reading and drop in BP since discontinuation of IV fluid. Pt currently mentating and baseline and juice provided as immediate intervention for hypoglycemia. Provider states new orders are given. Medications will be administered to pt pending arrival from pharmacy. Will continue to monitor closely for any acute changes or need for further intervention.

## 2021-10-21 NOTE — Progress Notes (Signed)
FPTS Brief Progress Note  S:Went bedside to see patient, patient sleeping. Did not disturb.   O: BP 106/80   Pulse (!) 101   Temp 98.2 F (36.8 C) (Oral)   Resp (!) 22   Ht 5' 2"  (1.575 m)   Wt 56.2 kg   SpO2 100%   BMI 22.68 kg/m     A/P: - Plans per day team - Orders reviewed. Labs for AM ordered, which was adjusted as needed.   Holley Bouche, MD 10/21/2021, 4:59 AM PGY-1, [facility] Night Resident  Please page [phone] with questions.

## 2021-10-21 NOTE — ED Notes (Signed)
Bladder scan completed. 54m noted in bladder.

## 2021-10-21 NOTE — ED Notes (Signed)
Pt noted to completely soil bed linens with urine drenching floor. Complete linen change and peri-care completed.

## 2021-10-22 DIAGNOSIS — E8809 Other disorders of plasma-protein metabolism, not elsewhere classified: Secondary | ICD-10-CM | POA: Diagnosis not present

## 2021-10-22 DIAGNOSIS — N179 Acute kidney failure, unspecified: Secondary | ICD-10-CM | POA: Diagnosis not present

## 2021-10-22 DIAGNOSIS — E162 Hypoglycemia, unspecified: Secondary | ICD-10-CM | POA: Diagnosis not present

## 2021-10-22 DIAGNOSIS — E860 Dehydration: Secondary | ICD-10-CM | POA: Diagnosis not present

## 2021-10-22 LAB — BASIC METABOLIC PANEL
Anion gap: 4 — ABNORMAL LOW (ref 5–15)
BUN: 18 mg/dL (ref 6–20)
CO2: 22 mmol/L (ref 22–32)
Calcium: 8.2 mg/dL — ABNORMAL LOW (ref 8.9–10.3)
Chloride: 122 mmol/L — ABNORMAL HIGH (ref 98–111)
Creatinine, Ser: 2.17 mg/dL — ABNORMAL HIGH (ref 0.44–1.00)
GFR, Estimated: 27 mL/min — ABNORMAL LOW (ref >60)
Glucose, Bld: 94 mg/dL (ref 70–99)
Potassium: 4.4 mmol/L (ref 3.5–5.1)
Sodium: 148 mmol/L — ABNORMAL HIGH (ref 135–145)

## 2021-10-22 LAB — GLUCOSE, CAPILLARY
Glucose-Capillary: 68 mg/dL — ABNORMAL LOW (ref 70–99)
Glucose-Capillary: 142 mg/dL — ABNORMAL HIGH (ref 70–99)
Glucose-Capillary: 159 mg/dL — ABNORMAL HIGH (ref 70–99)
Glucose-Capillary: 106 mg/dL — ABNORMAL HIGH (ref 70–99)
Glucose-Capillary: 125 mg/dL — ABNORMAL HIGH (ref 70–99)

## 2021-10-22 LAB — CBC
HCT: 28.5 % — ABNORMAL LOW (ref 36.0–46.0)
Hemoglobin: 8.9 g/dL — ABNORMAL LOW (ref 12.0–15.0)
MCH: 31.2 pg (ref 26.0–34.0)
MCHC: 31.2 g/dL (ref 30.0–36.0)
MCV: 100 fL (ref 80.0–100.0)
Platelets: 99 10*3/uL — ABNORMAL LOW (ref 150–400)
RBC: 2.85 MIL/uL — ABNORMAL LOW (ref 3.87–5.11)
RDW: 16.2 % — ABNORMAL HIGH (ref 11.5–15.5)
WBC: 9.7 10*3/uL (ref 4.0–10.5)
nRBC: 0 % (ref 0.0–0.2)

## 2021-10-22 LAB — VALPROIC ACID LEVEL: Valproic Acid Lvl: 81 ug/mL (ref 50.0–100.0)

## 2021-10-22 LAB — MRSA NEXT GEN BY PCR, NASAL: MRSA by PCR Next Gen: NOT DETECTED

## 2021-10-22 MED ORDER — MILK AND MOLASSES ENEMA: 1.0000 | Freq: Once | RECTAL | Status: DC | PRN

## 2021-10-22 NOTE — Progress Notes (Addendum)
Daily Progress Note Intern Pager: [phone]  Patient name: [name] Medical record number: [id number] Date of birth: Jun 26, 1975 Age: 46 y.o. Gender: female  Primary Care Provider: Leeanne Rio, MD Consultants: None Code Status: Full   Pt Overview and Major Events to Date:  6/11: Admitted  Assessment and Plan:  [name] is a 46 year old female who presented with hypoglycemia in the setting of N/V/D 2/2 gastroenteritis continued antihyperglycemics despite poor p.o. intake. Pertinent PMH/PSH includes intellectual delay, T2DM, schizophrenia, CKD stage III, seizures, and HLD.   * Hypoglycemia Patient with type 2 diabetes, who presented with hypoglycemia, after 2 days of decreased PO intake 2/2 vomiting and diarrhea for 2 days PTA as well as continued antihyperglycemic therapy. Home medications: Jardiance 10, Januvia 50, Glipizide 10 mg. Patient may gastroenteritis, given hx and symptoms vs. obstipation. Will hold DM meds.  -Continue to Hold DM meds -Continue CBG monitoring  Type 2 diabetes mellitus with renal manifestations (HCC) Home meds include: Jardiance 10 mg, Januvia 50 mg, glipizide 10 mg.  Patient hypoglycemic to 68 this a.m. with CBG increased up to 142 after drinking apple juice -Hold home meds for concerns for hypoglycemia  AKI (acute kidney injury) ([place]) Patient with history of CKD III, baseline Cr 1.7-1.9. Cr on admission 3.00, 2.17 this a.m. AKI likely pre-renal in setting of poor PO intake, and vomiting. Will continue to monitor -AM BMP -Encourage increased PO intake  Hypoalbuminemia Consistently low albumin since November 2022, ranging from 2.0 (this admission) to 3.2.  Last normal in 2021.  UA negative for protein.  May be 2/2 restrictive diet versus poor p.o. intake.  Concerns about nutritional status in living facility, so RD consult placed to assess needs.  CONSTIPATION, CHRONIC Patient taking colace 100 mg daily and miralax 17 g daily,  large stool burden noted on CT abdomen on admission.  Patient with medium sized BM yesterday and large BM reported this morning. -Continue increased dose of home meds to MiraLAX twice daily, and switched to Senokot-S 2 tablets twice daily.  Urinary retention-resolved as of 10/21/2021  CT report of significant bladder distention to 17 cm, patient I/O cathed, with 700 cc UOP. Patient psychiatric medications (Ingrezza, robinul, clozapine) have anticholinergic effects and could be causing the retention.  Patient with urinary incontinence this a.m., and postvoid bladder scan 60 cc. -Continue to monitor -We will discontinue bladder scans at this time unless concern for retention again arises   Chronic conditions: Seizures-continue home Depakote 2000 mg nightly HLD-continue home Crestor 20 mg daily Schizophrenia-continue home Seroquel 50 mg nightly, Clozaril 400 mg nightly, Ingrezza 80 mg nightly and paroxetine 10 mg daily    FEN/GI: Carb modified PPx: Lovenox Dispo:Home pending clinical improvement . Barriers include persistent a.m. hypoglycemia.   Subjective:  Patient seen at bedside with care provider.  No acute concerns or complaints are reported, but all of the care provider's questions were answered.  The patient has not yet had another bowel movement since 1 documented in ED yesterday; also reports no further emetic episodes.  Objective: Temp:  [98.2 F (36.8 C)-99.9 F (37.7 C)] 99.9 F (37.7 C) (06/13 1153) Pulse Rate:  [93-123] 110 (06/13 1153) Resp:  [13-20] 18 (06/13 1153) BP: (91-128)/(61-87) 125/87 (06/13 1153) SpO2:  [95 %-100 %] 99 % (06/13 1153) Physical Exam: General: Patient lying in bed, NAD, cooperative Cardiovascular: RRR, no murmurs/rubs/gallops Respiratory: CTA x2.  Normal WOB on room air Abdomen: Soft, NT/ND Extremities: Nonedematous bilateral lower extremities Psych: Normal mood and  affect  Laboratory: Most recent CBC Lab Results  Component Value Date    WBC 9.7 10/22/2021   HGB 8.9 (L) 10/22/2021   HCT 28.5 (L) 10/22/2021   MCV 100.0 10/22/2021   PLT 99 (L) 10/22/2021   Most recent BMP    Latest Ref Rng & Units 10/22/2021   12:27 AM  BMP  Glucose 70 - 99 mg/dL 94   BUN 6 - 20 mg/dL 18   Creatinine 0.44 - 1.00 mg/dL 2.17   Sodium 135 - 145 mmol/L 148   Potassium 3.5 - 5.1 mmol/L 4.4   Chloride 98 - 111 mmol/L 122   CO2 22 - 32 mmol/L 22   Calcium 8.9 - 10.3 mg/dL 8.2       Imaging/Diagnostic Tests: No new images to review  Rosezetta Schlatter, MD 10/22/2021, 1:18 PM  PGY-1, [place] Intern pager: [phone], text pages welcome Secure chat group [place]

## 2021-10-22 NOTE — Assessment & Plan Note (Addendum)
Consistently low albumin since November 2022, ranging from 2.0 (this admission) to 3.2. Last normal in 2021. UA negative for protein.May be 2/2 restrictive diet versus poor p.o. intake. Concerns about nutritional status in living facility. -RD outpatient follow-up.
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
## 2021-10-22 NOTE — Progress Notes (Signed)
FPTS Brief Progress Note  S: sleeping   O: BP 104/77 (BP Location: Left Arm)   Pulse 98   Temp 99.3 F (37.4 C) (Axillary)   Resp 13   Ht 5' 2"  (1.575 m)   Wt 56.2 kg   SpO2 97%   BMI 22.68 kg/m     A/P: - Orders reviewed. Labs for AM ordered, which was adjusted as needed.   Gladys Damme, MD 10/22/2021, 4:12 AM PGY-3, [facility] Night Resident  Please page [phone] with questions.

## 2021-10-23 ENCOUNTER — Other Ambulatory Visit (HOSPITAL_COMMUNITY): Payer: Self-pay

## 2021-10-23 DIAGNOSIS — K5900 Constipation, unspecified: Secondary | ICD-10-CM | POA: Diagnosis not present

## 2021-10-23 DIAGNOSIS — E1122 Type 2 diabetes mellitus with diabetic chronic kidney disease: Secondary | ICD-10-CM | POA: Diagnosis not present

## 2021-10-23 DIAGNOSIS — N1831 Chronic kidney disease, stage 3a: Secondary | ICD-10-CM | POA: Diagnosis not present

## 2021-10-23 LAB — BASIC METABOLIC PANEL
Anion gap: 7 (ref 5–15)
BUN: 13 mg/dL (ref 6–20)
CO2: 24 mmol/L (ref 22–32)
Calcium: 8.2 mg/dL — ABNORMAL LOW (ref 8.9–10.3)
Chloride: 115 mmol/L — ABNORMAL HIGH (ref 98–111)
Creatinine, Ser: 2.09 mg/dL — ABNORMAL HIGH (ref 0.44–1.00)
GFR, Estimated: 29 mL/min — ABNORMAL LOW (ref >60)
Glucose, Bld: 144 mg/dL — ABNORMAL HIGH (ref 70–99)
Potassium: 5.2 mmol/L — ABNORMAL HIGH (ref 3.5–5.1)
Sodium: 146 mmol/L — ABNORMAL HIGH (ref 135–145)

## 2021-10-23 LAB — CBC
HCT: 25 % — ABNORMAL LOW (ref 36.0–46.0)
Hemoglobin: 8.2 g/dL — ABNORMAL LOW (ref 12.0–15.0)
MCH: 31.4 pg (ref 26.0–34.0)
MCHC: 32.8 g/dL (ref 30.0–36.0)
MCV: 95.8 fL (ref 80.0–100.0)
Platelets: 99 10*3/uL — ABNORMAL LOW (ref 150–400)
RBC: 2.61 MIL/uL — ABNORMAL LOW (ref 3.87–5.11)
RDW: 15.9 % — ABNORMAL HIGH (ref 11.5–15.5)
WBC: 7.6 10*3/uL (ref 4.0–10.5)
nRBC: 0.3 % — ABNORMAL HIGH (ref 0.0–0.2)

## 2021-10-23 LAB — GLUCOSE, CAPILLARY
Glucose-Capillary: 126 mg/dL — ABNORMAL HIGH (ref 70–99)
Glucose-Capillary: 202 mg/dL — ABNORMAL HIGH (ref 70–99)
Glucose-Capillary: 149 mg/dL — ABNORMAL HIGH (ref 70–99)
Glucose-Capillary: 145 mg/dL — ABNORMAL HIGH (ref 70–99)

## 2021-10-23 MED ORDER — SENNOSIDES-DOCUSATE SODIUM 8.6-50 MG PO TABS
1.0000 | ORAL_TABLET | Freq: Every day | ORAL | 0 refills | Status: DC
  Filled 2021-10-23: qty 30, 30d supply, fill #0

## 2021-10-23 MED ORDER — LINAGLIPTIN 5 MG PO TABS
5.0000 mg | ORAL_TABLET | Freq: Every day | ORAL | 0 refills | Status: DC
  Filled 2021-10-23: qty 30, 30d supply, fill #0

## 2021-10-23 NOTE — Hospital Course (Addendum)
Type 2 diabetes mellitus with renal manifestations (HCC) Hypoglycemia-resolved on day of discharge Patient with type 2 diabetes, who presented with hypoglycemia, after 2 days of decreased PO intake 2/2 vomiting and diarrhea for 2 days prior to admission as well as continued antihyperglycemic therapy. Home medications: Jardiance 10, Januvia 50, Glipizide 10 mg.  Symptoms likely due to obstipation versus gastroenteritis, given hx and symptoms.  Held DM meds until hypoglycemia resolved after patient relieved significant stool burden seen on CT abdomen, which also resolved vomiting.  Upon discharge, glipizide was decreased to 2.30m daily, Jardiance resumed, and Januvia switched toTradjenta 5 mg daily, as Januvia is renally cleared.   Hypoalbuminemia Consistently low albumin since November 2022, ranging from 2.0 (this admission) to 3.2.  Last normal in 2021.  UA negative for protein.  May be 2/2 restrictive diet versus poor p.o. intake.  Concerns about nutritional status in living facility, advised RD outpatient follow-up.   CONSTIPATION, CHRONIC Patient taking colace 100 mg daily and miralax 17 g daily at home, large stool burden noted on CT abdomen on admission.  Chronic constipation likely in the setting of Clozaril use.  Patient with 6 medium to large bowel movements over the course of admission once increased dose of home meds to MiraLAX twice daily, and switched to Senokot-S 2 tablets twice daily.  Upon discharge, will resume MiraLAX once daily and Senokot-S 1 tablet daily.  AKI (acute kidney injury) (HCC)-resolved by day of discharge Patient with history of CKD III, baseline Cr 1.7-1.9. Cr on admission 3.00, 2.09 on day of discharge.  AKI was likely pre-renal in setting of poor PO intake and vomiting.     Urinary retention-resolved as of 10/21/2021  CT report of significant bladder distention to 17 cm, patient I/O cathed, with 700 cc UOP. Patient psychiatric medications (Ingrezza, robinul, clozapine)  have anticholinergic effects and could be causing the retention.  Patient with urinary incontinence on the a.m. of 6/12, and postvoid bladder scan 60 cc.  No further retention reported afterward.   Issues for follow-up: 1.  Blood pressure-patient with hypotensive to normotensive BP throughout admission.  Home metoprolol discontinued.  Consider resuming outpatient. 2.  Continue glucose monitoring outpatient  3. Repeat CBC in approx one week for Hgb 8.2. 4.  Nutritional status-patient with hypo-albuminemia since November 2022.  Consider assessing dietary needs with RD
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
## 2021-10-23 NOTE — Discharge Summary (Cosign Needed)
[facility] Discharge Summary  Patient name: [name] Medical record number: [id number] Date of birth: 29-Aug-1975 Age: 46 y.o. Gender: female Date of Admission: 10/20/2021  Date of Discharge: 10/23/2021 Admitting Physician: Rosezetta Schlatter, MD  Primary Care Provider: Leeanne Rio, MD Consultants: N/A  Indication for Hospitalization: Hypoglycemia in the setting of nausea, vomiting, and diarrhea for 2 days prior to admission.  Discharge Diagnoses/Problem List:  Hypoglycemia Type 2 diabetes mellitus with renal manifestations Constipation, chronic Hypoalbuminemia  Resolved AKI Urinary retention  Disposition: Home with guardian  Discharge Condition: Stable  Discharge Exam: This a.m., patient lethargic, but cooperative with assessment.  She reports no pain nor concerns today.  She had 4 medium to large bowel movements yesterday.  General: Lying in bed, lethargic but in NAD, cooperative Cardiovascular: RRR, no murmur/rub/gallops Respiratory: CTA x2.  Normal WOB on room air Abdomen: Soft, NT/ND.  No palpable stool today Extremities: Normal skin turgor of and nonedematous bilateral lower extremities.    Brief Hospital Course:  Type 2 diabetes mellitus with renal manifestations ([place]) Hypoglycemia-resolved on day of discharge Patient with type 2 diabetes, who presented with hypoglycemia, after 2 days of decreased PO intake 2/2 vomiting and diarrhea for 2 days prior to admission as well as continued antihyperglycemic therapy. Home medications: Jardiance 10, Januvia 50, Glipizide 10 mg.  Symptoms likely due to obstipation versus gastroenteritis, given hx and symptoms.  Held DM meds throughout admission, and hypoglycemia resolved after patient relieved significant stool burden seen on CT abdomen, which also resolved vomiting.  Upon discharge, will discontinue glipizide due to hypoglycemia AE, resume Jardiance, and switch Januvia to Tradjenta 5 mg daily,  as Januvia renally cleared.   Hypoalbuminemia Consistently low albumin since November 2022, ranging from 2.0 (this admission) to 3.2.  Last normal in 2021.  UA negative for protein.  May be 2/2 restrictive diet versus poor p.o. intake.  Concerns about nutritional status in living facility, advised RD outpatient follow-up.   CONSTIPATION, CHRONIC Patient taking colace 100 mg daily and miralax 17 g daily at home, large stool burden noted on CT abdomen on admission.  Chronic constipation likely in the setting of Clozaril use.  Patient with 6 medium to large bowel movements over the course of admission once increased dose of home meds to MiraLAX twice daily, and switched to Senokot-S 2 tablets twice daily.  Upon discharge, will resume MiraLAX once daily and Senokot-S 1 tablet daily.  AKI (acute kidney injury) (HCC)-resolved by day of discharge Patient with history of CKD III, baseline Cr 1.7-1.9. Cr on admission 3.00, 2.09 on day of discharge.  AKI was likely pre-renal in setting of poor PO intake and vomiting.     Urinary retention-resolved as of 10/21/2021  CT report of significant bladder distention to 17 cm, patient I/O cathed, with 700 cc UOP. Patient psychiatric medications (Ingrezza, robinul, clozapine) have anticholinergic effects and could be causing the retention.  Patient with urinary incontinence on the a.m. of 6/12, and postvoid bladder scan 60 cc.  No further retention reported afterward.   Issues for follow-up: 1.  Blood pressure-patient with hypotensive to normotensive BP throughout admission.  Home metoprolol discontinued.  Consider resuming outpatient. 2.  Hypoglycemia-patient presented with hypoglycemia, so  home glipizide discontinued.  Home sitagliptin switched to linagliptin as the former is renally cleared.  Continue DM monitoring outpatient and assess need to reinstate antihyperglycemics. 3. Repeat CBC in approx one week for Hgb 8.2. 4.  Nutritional status-patient with  hypo-albuminemia since November 2022.  Consider assessing dietary needs.   Significant Procedures: N/A  Significant Labs and Imaging:  Recent Labs  Lab 10/21/21 0415 10/22/21 0027 10/23/21 0107  WBC 9.5 9.7 7.6  HGB 9.0* 8.9* 8.2*  HCT 27.5* 28.5* 25.0*  PLT 97* 99* 99*   Recent Labs  Lab 10/20/21 1359 10/20/21 1436 10/20/21 1441 10/21/21 0415 10/21/21 0853 10/22/21 0027 10/23/21 0107  NA 140 141 141 146* 147* 148* 146*  K 4.7 4.0 4.0 4.3 4.5 4.4 5.2*  CL 110 111  --  120* 122* 122* 115*  CO2 21*  --   --  21* 18* 22 24  GLUCOSE 59* 56*  --  79 192* 94 144*  BUN 43* 39*  --  27* 24* 18 13  CREATININE 2.66* 3.00*  --  2.31* 2.20* 2.17* 2.09*  CALCIUM 8.3*  --   --  7.7* 8.1* 8.2* 8.2*  ALKPHOS 59  --   --  55  --   --   --   AST 25  --   --  19  --   --   --   ALT 17  --   --  12  --   --   --   ALBUMIN 2.3*  --   --  2.0*  --   --   --       Results/Tests Pending at Time of Discharge: N/A  Discharge Medications:  Allergies as of 10/23/2021       Reactions   Minocycline    Unknown         Medication List     STOP taking these medications    docusate sodium 100 MG capsule Commonly known as: COLACE   glipiZIDE 10 MG tablet Commonly known as: GLUCOTROL   metoprolol succinate 25 MG 24 hr tablet Commonly known as: TOPROL-XL   sitaGLIPtin 50 MG tablet Commonly known as: Januvia   Valtoco 15 MG Dose 2 x 7.5 MG/0.1ML Lqpk Generic drug: diazePAM (15 MG Dose)       TAKE these medications    acetaminophen 500 MG tablet Commonly known as: GoodSense Pain Relief Extra St Take 1 tablet (500 mg total) by mouth every 8 (eight) hours as needed for mild pain or fever.   aspirin 81 MG chewable tablet Commonly known as: GoodSense Aspirin Chew 1 tablet (81 mg total) by mouth daily.   atorvastatin 20 MG tablet Commonly known as: LIPITOR Take 1 tablet (20 mg total) by mouth at bedtime.   cetaphil cream APPLY DAILY What changed:  how much to take how  to take this   cloZAPine 100 MG tablet Commonly known as: CLOZARIL Take 4 tablets (400 mg total) by mouth at bedtime for 7 days.   divalproex 500 MG 24 hr tablet Commonly known as: DEPAKOTE ER Take 2,000 mg by mouth at bedtime.   famotidine 20 MG tablet Commonly known as: PEPCID Take 1 tablet (20 mg total) by mouth 2 (two) times daily.   glycopyrrolate 1 MG tablet Commonly known as: ROBINUL Take 1 mg by mouth daily.   Incontinence Brief Medium Misc Wear every night   Ingrezza 80 MG capsule Generic drug: valbenazine Take 80 mg by mouth at bedtime.   Jardiance 10 MG Tabs tablet Generic drug: empagliflozin TAKE ONE TABLET BY MOUTH ONCE DAILY   medroxyPROGESTERone 150 MG/ML injection Commonly known as: DEPO-PROVERA Inject 150 mg into the muscle every 3 (three) months.   ONE TOUCH DELICA LANCING DEV Misc Check blood sugar once  daily   OneTouch Delica Lancets 68D Misc Check blood sugar once daily   OneTouch Verio test strip Generic drug: glucose blood CHECK BLOOD GLUCOSE (SUGAR) EVERY DAY   OneTouch Verio w/Device Kit Check blood sugar once daily   PARoxetine 10 MG tablet Commonly known as: PAXIL Take 10 mg by mouth daily.   polyethylene glycol powder 17 GM/SCOOP powder Commonly known as: GLYCOLAX/MIRALAX Take 17 g by mouth at bedtime.   QUEtiapine 50 MG tablet Commonly known as: SEROQUEL Take 50 mg by mouth at bedtime.   Senexon-S 8.6-50 MG tablet Generic drug: senna-docusate Take 1 tablet by mouth at bedtime.   simethicone 125 MG chewable tablet Commonly known as: Gas-X Extra Strength Chew 1 tablet (125 mg total) by mouth daily as needed for flatulence.   Tradjenta 5 MG Tabs tablet Generic drug: linagliptin Take 1 tablet (5 mg total) by mouth daily.        Discharge Instructions: Please refer to Patient Instructions section of EMR for full details.  Patient was counseled important signs and symptoms that should prompt return to medical care,  changes in medications, dietary instructions, activity restrictions, and follow up appointments.   Follow-Up Appointments:   Rosezetta Schlatter, MD 10/23/2021, 11:29 AM PGY-1, [place]

## 2021-10-23 NOTE — Plan of Care (Signed)

## 2021-10-23 NOTE — Discharge Instructions (Addendum)
Dear Ms. [name], I am so glad you are feeling better and can be discharged today. You were admitted for low blood sugar in the setting of vomiting, diarrhea, and use of diabetes medications while unable to eat your normal amount.   Please see the following instructions: Please see your primary care / family doctor for a hospital follow-up visit about 1 week after discharge. NEW meds:  Senna-S for constipation. Tradjenta 5 mg daily for diabetes CONTINUE home meds: As listed on discharge paperwork. Glipizide decreased to 2.5 mg daily. STOP meds:  Januvia (replaced with Tradjenta). Metoprolol- held for blood pressure.   Report any adverse effects and/or reactions from the medicines to your outpatient provider promptly. Do not engage in alcohol and or illegal drug use while on prescription medicines. In the event of worsening symptoms, call 911 and/or go to the nearest ED for appropriate evaluation and treatment of symptoms.  It was a pleasure meeting you, Ms. [name].  I wish you and your family the best, and hope you stay happy and healthy!  Rosezetta Schlatter, MD 10/23/2021

## 2021-10-23 NOTE — Progress Notes (Signed)
FPTS Brief Progress Note  S: sleeping   O: BP 103/62 (BP Location: Right Arm)   Pulse 97   Temp 99.1 F (37.3 C) (Axillary)   Resp 16   Ht 5' 2"  (1.575 m)   Wt 56.2 kg   SpO2 99%   BMI 22.68 kg/m     A/P: - Orders reviewed. Labs for AM ordered, which was adjusted as needed.   Gladys Damme, MD 10/23/2021, 1:34 AM PGY-3, [facility] Night Resident  Please page [phone] with questions.

## 2021-10-24 DIAGNOSIS — E440 Moderate protein-calorie malnutrition: Secondary | ICD-10-CM | POA: Insufficient documentation

## 2021-10-24 DIAGNOSIS — E162 Hypoglycemia, unspecified: Secondary | ICD-10-CM | POA: Diagnosis not present

## 2021-10-24 DIAGNOSIS — N179 Acute kidney failure, unspecified: Secondary | ICD-10-CM | POA: Diagnosis not present

## 2021-10-24 DIAGNOSIS — E860 Dehydration: Secondary | ICD-10-CM | POA: Diagnosis not present

## 2021-10-24 LAB — BASIC METABOLIC PANEL
Anion gap: 5 (ref 5–15)
BUN: 9 mg/dL (ref 6–20)
CO2: 27 mmol/L (ref 22–32)
Calcium: 8.4 mg/dL — ABNORMAL LOW (ref 8.9–10.3)
Chloride: 116 mmol/L — ABNORMAL HIGH (ref 98–111)
Creatinine, Ser: 2.09 mg/dL — ABNORMAL HIGH (ref 0.44–1.00)
GFR, Estimated: 29 mL/min — ABNORMAL LOW (ref >60)
Glucose, Bld: 169 mg/dL — ABNORMAL HIGH (ref 70–99)
Potassium: 5.1 mmol/L (ref 3.5–5.1)
Sodium: 148 mmol/L — ABNORMAL HIGH (ref 135–145)

## 2021-10-24 LAB — GLUCOSE, CAPILLARY
Glucose-Capillary: 139 mg/dL — ABNORMAL HIGH (ref 70–99)
Glucose-Capillary: 164 mg/dL — ABNORMAL HIGH (ref 70–99)
Glucose-Capillary: 136 mg/dL — ABNORMAL HIGH (ref 70–99)
Glucose-Capillary: 220 mg/dL — ABNORMAL HIGH (ref 70–99)

## 2021-10-24 MED ORDER — POLYETHYLENE GLYCOL 3350 17 G PO PACK
17.0000 g | PACK | Freq: Every day | ORAL | Status: DC
  Administered 2021-10-25 – 2021-10-28 (×4): 17 g via ORAL
  Filled 2021-10-24 (×5): qty 1

## 2021-10-24 MED ORDER — LINAGLIPTIN 5 MG PO TABS
5.0000 mg | ORAL_TABLET | Freq: Every day | ORAL | Status: DC
Start: 2021-10-24 — End: 2021-10-30
  Administered 2021-10-24 – 2021-10-30 (×7): 5 mg via ORAL
  Filled 2021-10-24 (×7): qty 1

## 2021-10-24 MED ORDER — ADULT MULTIVITAMIN W/MINERALS CH
1.0000 | ORAL_TABLET | Freq: Every day | ORAL | Status: DC
  Administered 2021-10-24 – 2021-10-30 (×7): 1 via ORAL
  Filled 2021-10-24 (×7): qty 1

## 2021-10-24 MED ORDER — SENNOSIDES-DOCUSATE SODIUM 8.6-50 MG PO TABS
1.0000 | ORAL_TABLET | Freq: Every day | ORAL | Status: DC
  Administered 2021-10-25 – 2021-10-29 (×5): 1 via ORAL
  Filled 2021-10-24 (×5): qty 1

## 2021-10-24 MED ORDER — EMPAGLIFLOZIN 10 MG PO TABS
10.0000 mg | ORAL_TABLET | Freq: Every day | ORAL | Status: DC
  Administered 2021-10-24 – 2021-10-30 (×7): 10 mg via ORAL
  Filled 2021-10-24 (×7): qty 1
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
## 2021-10-24 NOTE — Progress Notes (Signed)
FPTS Brief Progress Note  S: sleeping   O: BP (!) 90/57 (BP Location: Left Arm)   Pulse 100   Temp 98.6 F (37 C) (Oral)   Resp 16   Ht 5' 2"  (1.575 m)   Wt 56.2 kg   SpO2 96%   BMI 22.68 kg/m     A/P: - Orders reviewed. Labs for AM ordered, which was adjusted as needed.   Gladys Damme, MD 10/24/2021, 3:42 AM PGY-3, [facility] Night Resident  Please page [phone] with questions.

## 2021-10-24 NOTE — Care Management Important Message (Signed)
Important Message  Patient Details  Name: [name] MRN: [id number] Date of Birth: September 04, 1975   Medicare Important Message Given:  Yes     [name] 10/24/2021, 2:22 PM

## 2021-10-24 NOTE — Progress Notes (Signed)
Daily Progress Note Intern Pager: [phone]  Patient name: [name] Medical record number: [id number] Date of birth: Sep 08, 1975 Age: 46 y.o. Gender: female  Primary Care Provider: Leeanne Rio, MD Consultants: None Code Status: Full  Pt Overview and Major Events to Date:  6/11: Admitted 6/14 PM: Informed that group home had an incident, deeming it uninhabitable, and preventing patient's discharge  Assessment and Plan:  Brendan Gruwell is a 46 year old female who presented with hypoglycemia in the setting of N/V/D in the setting of obstipation and continued antihyperglycemic use despite poor p.o. intake. Pertinent PMH/PSH includes intellectual delay, T2DM, schizophrenia, CKD stage III, seizures, and HLD. Patient medically stable for discharge, but delayed due to housing.  * Hypoglycemia Type 2 diabetes mellitus with renal manifestations ([place]) Patient with type 2 diabetes, who presented with hypoglycemia, after 2 days of decreased PO intake 2/2 vomiting and diarrhea for 2 days prior to admission as well as continued antihyperglycemic therapy. Home medications: Jardiance 10, Januvia 50, Glipizide 10 mg.  Symptoms likely due to obstipation versus gastroenteritis, given hx and symptoms.  Held DM meds throughout admission, and hypoglycemia resolved after patient relieved significant stool burden seen on CT abdomen, which also resolved vomiting.  Upon discharge, will discontinue glipizide due to hypoglycemia AE, resume Jardiance, and switch Januvia to Tradjenta 5 mg daily, as Januvia renally cleared.  AKI (acute kidney injury) ([place]) Patient with history of CKD III, baseline Cr 1.7-1.9. Cr on admission 3.00, 2.09 today.  AKI was likely pre-renal in setting of poor PO intake and vomiting.  Hypoalbuminemia Consistently low albumin since November 2022, ranging from 2.0 (this admission) to 3.2.  Last normal in 2021.  UA negative for protein.  May be 2/2 restrictive diet versus poor  p.o. intake.  Concerns about nutritional status in living facility, advised RD outpatient follow-up.  CONSTIPATION, CHRONIC Patient taking colace 100 mg daily and miralax 17 g daily at home, large stool burden noted on CT abdomen on admission.  Chronic constipation likely in the setting of Clozaril use.  Patient with 6 medium to large bowel movements over the course of admission once increased dose of home meds to MiraLAX twice daily, and switched to Senokot-S 2 tablets twice daily.  Upon discharge, will resume MiraLAX once daily and Senokot-S 1 tablet daily.  Urinary retention-resolved as of 10/21/2021 CT report of significant bladder distention to 17 cm, patient I/O cathed, with 700 cc UOP. Patient psychiatric medications (Ingrezza, robinul, clozapine) have anticholinergic effects and could be causing the retention.  Patient with urinary incontinence on the a.m. of 6/12, and postvoid bladder scan 60 cc.  No further retention reported afterward.     Chronic conditions: Seizures-continue home Depakote 2 g nightly HLD-continue home Crestor 20 mg daily Schizophrenia-continue home Seroquel 50 mg nightly, Clozaril 400 mg nightly, Ingrezza 80 mg nightly, paroxetine 10 mg daily    FEN/GI: Carb modified PPx: Lovenox Dispo:Home  pending housing per guardian . Barriers include incident at living area, making the space uninhabitable.   Subjective:  This a.m., patient lethargic, but cooperative with assessment.  She reports no pain nor concerns today.  She had 4 medium to large bowel movements yesterday.  Objective: Vitals reviewed Physical Exam: General: Lying in bed, lethargic but in NAD, cooperative Cardiovascular: RRR, no murmur/rub/gallops Respiratory: CTA x2.  Normal WOB on room air Abdomen: Soft, NT/ND.  No palpable stool today Extremities: Normal skin turgor of and nonedematous bilateral lower extremities.   Laboratory: Most recent CBC Lab Results  Component Value Date   WBC 7.6  10/23/2021   HGB 8.2 (L) 10/23/2021   HCT 25.0 (L) 10/23/2021   MCV 95.8 10/23/2021   PLT 99 (L) 10/23/2021   Most recent BMP    Latest Ref Rng & Units 10/24/2021    9:31 AM  BMP  Glucose 70 - 99 mg/dL 169   BUN 6 - 20 mg/dL 9   Creatinine 0.44 - 1.00 mg/dL 2.09   Sodium 135 - 145 mmol/L 148   Potassium 3.5 - 5.1 mmol/L 5.1   Chloride 98 - 111 mmol/L 116   CO2 22 - 32 mmol/L 27   Calcium 8.9 - 10.3 mg/dL 8.4      Imaging/Diagnostic Tests: No new images to review   Rosezetta Schlatter, MD 10/23/2021, 8:45 AM  PGY-1, [place] Intern pager: [phone], text pages welcome Secure chat group [place]

## 2021-10-24 NOTE — Progress Notes (Signed)
Initial Nutrition Assessment  DOCUMENTATION CODES:   Non-severe (moderate) malnutrition in context of social or environmental circumstances  INTERVENTION:   Multivitamin w/ minerals daily Liberalize pt diet to regular due to malnutrition Encourage good PO intake   NUTRITION DIAGNOSIS:   Moderate Malnutrition related to social / environmental circumstances as evidenced by mild fat depletion, moderate muscle depletion.  GOAL:   Patient will meet greater than or equal to 90% of their needs  MONITOR:   PO intake, Labs, Weight trends, I & O's  REASON FOR ASSESSMENT:   Consult Assessment of nutrition requirement/status  ASSESSMENT:   46 y.o. female presented to the ED with hypoglycemia. PMH includes intelectual disability, schizophrenia, GERD, CKD III, and T2DM. Pt admitted with hypoglycemia and AKI.   Pt reports that she was not eating well PTA due to being sick. Unable to report what she eats at home.  RD observed pt breakfast tray in room, pt consumed >75% of the tray.   Pt reports that she has lost weight, but was unable to elaborate on amount or timeframe.  Per EMR, pt has had a 10% weight loss within 7 months, this is not clinically significant for time frame.   Pt continuously ask for juice during RD visit, RD to order pt juice on lunch tray.   Medications reviewed and include: Jardiance, Pepcid, Tradjenta, Miralax, Senokot Labs reviewed: Sodium 148, Creatinine 2.09, 24 hr CBG 139-202  NUTRITION - FOCUSED PHYSICAL EXAM:  Flowsheet Row Most Recent Value  Orbital Region No depletion  Upper Arm Region Mild depletion  Thoracic and Lumbar Region Mild depletion  Buccal Region No depletion  Temple Region No depletion  Clavicle Bone Region Moderate depletion  Clavicle and Acromion Bone Region Moderate depletion  Scapular Bone Region Moderate depletion  Dorsal Hand Mild depletion  Patellar Region Moderate depletion  Anterior Thigh Region Moderate depletion  Posterior  Calf Region Moderate depletion  Edema (RD Assessment) None  Hair Reviewed  Eyes Reviewed  Mouth Reviewed  Skin Reviewed  Nails Reviewed   Diet Order:   Diet Order             Diet regular Room service appropriate? Yes with Assist; Fluid consistency: Thin  Diet effective now           Diet - low sodium heart healthy                   EDUCATION NEEDS:   Not appropriate for education at this time  Skin:  Skin Assessment: Reviewed RN Assessment  Last BM:  6/14 - Type 4  Height:   Ht Readings from Last 1 Encounters:  10/20/21 5' 2"  (1.575 m)    Weight:   Wt Readings from Last 1 Encounters:  10/20/21 56.2 kg    BMI:  Body mass index is 22.68 kg/m.  Estimated Nutritional Needs:   Kcal:  1700-1900  Protein:  85-100 gm  Fluid:  >/= 1.7 L    Hermina Barters RD, LDN Clinical Dietitian See [facility] for contact information.

## 2021-10-25 DIAGNOSIS — E162 Hypoglycemia, unspecified: Secondary | ICD-10-CM | POA: Diagnosis not present

## 2021-10-25 LAB — GLUCOSE, CAPILLARY: Glucose-Capillary: 170 mg/dL — ABNORMAL HIGH (ref 70–99)

## 2021-10-25 NOTE — Progress Notes (Signed)
Daily Progress Note Intern Pager: [phone]  Patient name: [name] Medical record number: [id number] Date of birth: 08-28-1975 Age: 46 y.o. Gender: female  Primary Care Provider: Leeanne Rio, MD Consultants: None Code Status: Full  Pt Overview and Major Events to Date:  6/11: Admitted 6/14: Informed that group home had an incident, deeming it uninhabitable, and preventing patient's discharge  Assessment and Plan:  [name] is a 46 year old female who presented with hypoglycemia 2/2 N/V in the setting of obstipation and continued antihyperglycemic use despite poor PO intake. Pertinent PMH/PSH includes intellectual delay, T2DM, schizophrenia, CKD stage III, seizures, and HLD. Patient remains medically stable for discharge.   * Hypoglycemia Patient with type 2 diabetes, who presented with hypoglycemia, after 2 days of decreased PO intake 2/2 vomiting and diarrhea for 2 days prior to admission as well as continued antihyperglycemic therapy. Home medications: Jardiance 10, Januvia 50, Glipizide 10 mg.  Symptoms likely due to obstipation versus gastroenteritis, given hx and symptoms.  Held DM meds throughout admission, and hypoglycemia resolved after patient relieved significant stool burden seen on CT abdomen, which also resolved vomiting.  Discontinued glipizide due to hypoglycemic AE, resume Jardiance, and switch Januvia to Tradjenta 5 mg daily, as Januvia renally cleared.  AM CBG 170. - Will continue daily CBG monitoring while patient remains in the hospital -Continue home Jardiance and Tradjenta  Type 2 diabetes mellitus with renal manifestations ([place]) Patient with type 2 diabetes, who presented with hypoglycemia, after 2 days of decreased PO intake 2/2 vomiting and diarrhea for 2 days prior to admission as well as continued antihyperglycemic therapy. Home medications: Jardiance 10, Januvia 50, Glipizide 10 mg.  Symptoms likely due to obstipation versus  gastroenteritis, given hx and symptoms.  Held DM meds throughout admission, and hypoglycemia resolved after patient relieved significant stool burden seen on CT abdomen, which also resolved vomiting.  Discontinued glipizide due to hypoglycemic AE, resume Jardiance, and switch Januvia to Tradjenta 5 mg daily, as Januvia renally cleared.   AKI (acute kidney injury) ([place]) Patient with history of CKD III, baseline Cr 1.7-1.9. Cr on admission 3.00, 2.09 by day of discharge.  AKI was likely pre-renal in setting of poor PO intake and vomiting.  Hypoalbuminemia Consistently low albumin since November 2022, ranging from 2.0 (this admission) to 3.2.  Last normal in 2021.  UA negative for protein.  May be 2/2 restrictive diet versus poor p.o. intake.  Concerns about nutritional status in living facility, advised RD outpatient follow-up.  CONSTIPATION, CHRONIC Patient taking colace 100 mg daily and miralax 17 g daily at home, large stool burden noted on CT abdomen on admission.  Chronic constipation likely in the setting of Clozaril use.  Patient with 6 medium to large bowel movements over the course of admission once increased dose of home meds to MiraLAX twice daily, and switched to Senokot-S 2 tablets twice daily.  Will resume MiraLAX once daily and Senokot-S 1 tablet daily.  Urinary retention-resolved as of 10/21/2021 CT report of significant bladder distention to 17 cm, patient I/O cathed, with 700 cc UOP. Patient psychiatric medications (Ingrezza, robinul, clozapine) have anticholinergic effects and could be causing the retention.  Patient with urinary incontinence on the a.m. of 6/12, and postvoid bladder scan 60 cc.  No further retention reported afterward.     Chronic conditions: Seizures-continue home Depakote 2 g nightly HLD-continue home Crestor 20 mg daily Schizophrenia-continue home Seroquel 50 mg nightly, Clozaril 400 mg nightly, Ingrezza 80 mg nightly, paroxetine 10  mg daily    FEN/GI:  Regular PPx: Lovenox Dispo:Home housing per guardian . Barriers include incident at living area, making the space uninhabitable.  Subjective:  Patient is seen eating breakfast comfortably.  She reports no acute concerns or complaints today and notes resolution of her nausea.  Objective: Temp:  [98.4 F (36.9 C)-99.5 F (37.5 C)] 99.5 F (37.5 C) (06/16 0741) Pulse Rate:  [103-110] 106 (06/16 1205) Resp:  [16-18] 18 (06/16 0741) BP: (106-122)/(71-90) 115/79 (06/16 1205) SpO2:  [97 %-100 %] 100 % (06/16 1205) Physical Exam: General: Lying in bed and in breakfast, NAD, cooperative Cardiovascular: RRR, no murmurs/rubs/gallops Respiratory: CTA x2.  Normal WOB on room air Abdomen: Soft, NT/ND.  Normal bowel sounds Extremities: Nonedematous BLE.  Normal skin turgor  Laboratory: Most recent CBC Lab Results  Component Value Date   WBC 7.6 10/23/2021   HGB 8.2 (L) 10/23/2021   HCT 25.0 (L) 10/23/2021   MCV 95.8 10/23/2021   PLT 99 (L) 10/23/2021   Most recent BMP    Latest Ref Rng & Units 10/24/2021    9:31 AM  BMP  Glucose 70 - 99 mg/dL 169   BUN 6 - 20 mg/dL 9   Creatinine 0.44 - 1.00 mg/dL 2.09   Sodium 135 - 145 mmol/L 148   Potassium 3.5 - 5.1 mmol/L 5.1   Chloride 98 - 111 mmol/L 116   CO2 22 - 32 mmol/L 27   Calcium 8.9 - 10.3 mg/dL 8.4     Imaging/Diagnostic Tests: No new images to review   Rosezetta Schlatter, MD 10/25/2021, 2:05 PM  PGY-1, [place] Intern pager: [phone], text pages welcome Secure chat group [place]

## 2021-10-25 NOTE — Progress Notes (Signed)
FPTS Brief Note Reviewed patient's vitals, recent notes.  Vitals:   10/24/21 1551 10/24/21 2132  BP: 122/83 106/71  Pulse: (!) 110 (!) 103  Resp: 18 16  Temp: 98.5 F (36.9 C) 98.8 F (37.1 C)  SpO2:  97%   At this time, no change in plan from day progress note.  Gladys Damme, MD Page [phone] with questions about this patient.

## 2021-10-25 NOTE — TOC Progression Note (Addendum)
Transition of Care [facility]) - Progression Note    Patient Details  Name: [name] MRN: [id number] Date of Birth: [birth year]  [facility]) CM/SW [place], LCSW Phone Number: 10/25/2021, 3:17 PM  Clinical Narrative:    CSW left voicemail for [name] [place] [phone]), patient's legal guardian.   Sarah returned call and stated that she has approval for a new AFL but the [employer] is having to complete an inspection which will likely not happen before Monday. She will contact CSW once she knows more. CSW updated TOC Supervisor.   Expected Discharge Plan: Group Home Barriers to Discharge: No Barriers Identified  Expected Discharge Plan and Services Expected Discharge Plan: Group Home         Expected Discharge Date: 10/23/21                                     Social Determinants of Health (SDOH) Interventions    Readmission Risk Interventions     No data to display

## 2021-10-25 NOTE — Progress Notes (Signed)
FPTS Brief Note Reviewed patient's vitals, recent notes.  Vitals:   10/25/21 1205 10/25/21 2004  BP: 115/79 99/68  Pulse: (!) 106 (!) 101  Resp:  16  Temp:  98.9 F (37.2 C)  SpO2: 100% 100%   At this time, no change in plan from day progress note.  Donney Dice, DO Page [phone] with questions about this patient.

## 2021-10-26 DIAGNOSIS — E162 Hypoglycemia, unspecified: Secondary | ICD-10-CM | POA: Diagnosis not present

## 2021-10-26 LAB — GLUCOSE, CAPILLARY: Glucose-Capillary: 256 mg/dL — ABNORMAL HIGH (ref 70–99)

## 2021-10-26 NOTE — Progress Notes (Signed)
Daily Progress Note Intern Pager: [phone]  Patient name: [name] Medical record number: [id number] Date of birth: 1975/09/23 Age: 46 y.o. Gender: female  Primary Care Provider: Leeanne Rio, MD Consultants: None Code Status: Full  Pt Overview and Major Events to Date:  6/11: Admitted 6/14: Informed that group home had an incident, deeming it uninhabitable, and preventing patient's discharge  Assessment and Plan:  [name] is a 46 year old female who presented with hypoglycemia 2/2 N/V in the setting of obstipation and continued antihyperglycemic use despite poor PO intake. Pertinent PMH/PSH includes intellectual delay, T2DM, schizophrenia, CKD stage III, seizures, and HLD. Patient remains medically stable for discharge.  * Hypoglycemia Patient with type 2 diabetes, who presented with hypoglycemia, after 2 days of decreased PO intake 2/2 vomiting and diarrhea for 2 days prior to admission as well as continued antihyperglycemic therapy. Held DM meds throughout admission, and hypoglycemia resolved after patient relieved significant stool burden seen on CT abdomen, which also resolved vomiting. Will continue to monitor and follow up AM CBG check. -F/u AM CBG check - Daily AM CBG monitoring while patient remains in the hospital -Continue Jardiance and Tradjenta  Type 2 diabetes mellitus with renal manifestations ([place]) Patient with type 2 diabetes, who presented with hypoglycemia, after 2 days of decreased PO intake 2/2 vomiting and diarrhea for 2 days prior to admission as well as continued antihyperglycemic therapy. Held DM meds throughout admission, and hypoglycemia resolved after patient relieved significant stool burden seen on CT abdomen, which also resolved vomiting. Restarted home meds. -Jardiance 10 mg daily -Tradjenta 5 mg daily  AKI (acute kidney injury) ([place]) Patient with history of CKD III, baseline Cr 1.7-1.9. Cr on admission 3.00, trending down by  day of discharge.  AKI was likely pre-renal in setting of poor PO intake and vomiting.  Hypoalbuminemia Consistently low albumin since November 2022, ranging from 2.0 (this admission) to 3.2.  Last normal in 2021.  UA negative for protein.  May be 2/2 restrictive diet versus poor p.o. intake.  Concerns about nutritional status in living facility. -RD outpatient follow-up.  CONSTIPATION, CHRONIC Chronic constipation likely in the setting of Clozaril use.  Patient with 6 medium to large bowel movements over the course of admission once increased dose of home meds to MiraLAX twice daily, and Senokot-S 2 tablets twice daily. Med's have since been switched back to home dose.  -Miralax 17 g daily -Senokot 1 tablet daily  Urinary retention-resolved as of 10/21/2021 CT report of significant bladder distention to 17 cm, patient I/O cathed, with 700 cc UOP. Patient psychiatric medications (Ingrezza, robinul, clozapine) have anticholinergic effects and could be causing the retention.  Patient with urinary incontinence on the a.m. of 6/12, and postvoid bladder scan 60 cc.  No further retention reported afterward.      Chronic conditions: Seizures-continue home Depakote 2 g nightly HLD-continue home Crestor 20 mg daily Schizophrenia-continue home Seroquel 50 mg nightly, Clozaril 400 mg nightly, Ingrezza 80 mg nightly, paroxetine 10 mg daily  FEN/GI: Regular PPx: Lovenox Dispo: Home housing per guardian  in 2-3 days. Barriers include incident at group home, that is now unihabitable.   Subjective:  Patient reports stomach ache this morning, and that she vomited last night. Nursing noted that patient ate a ton of junk food overnight, likely leading to her stomach becoming upset.   Objective: Temp:  [97.8 F (36.6 C)-98.9 F (37.2 C)] 97.8 F (36.6 C) (06/17 0337) Pulse Rate:  [90-106] 90 (06/17 0734) Resp:  [  16-18] 18 (06/17 0734) BP: (83-115)/(49-79) 83/49 (06/17 0734) SpO2:  [100 %] 100 %  (06/16 2004) Physical Exam: General: NAD, cooperative, laying in bed comfortably Cardiovascular: RRR, [place] Respiratory: CTABL Abdomen: Soft, NT/ND, normal bowel sounds Extremities: Nonedamatous  Laboratory: Most recent CBC Lab Results  Component Value Date   WBC 7.6 10/23/2021   HGB 8.2 (L) 10/23/2021   HCT 25.0 (L) 10/23/2021   MCV 95.8 10/23/2021   PLT 99 (L) 10/23/2021   Most recent BMP    Latest Ref Rng & Units 10/24/2021    9:31 AM  BMP  Glucose 70 - 99 mg/dL 169   BUN 6 - 20 mg/dL 9   Creatinine 0.44 - 1.00 mg/dL 2.09   Sodium 135 - 145 mmol/L 148   Potassium 3.5 - 5.1 mmol/L 5.1   Chloride 98 - 111 mmol/L 116   CO2 22 - 32 mmol/L 27   Calcium 8.9 - 10.3 mg/dL 8.4        Holley Bouche, MD 10/26/2021, 8:09 AM  PGY-1, [place] Intern pager: [phone], text pages welcome Secure chat group [place]

## 2021-10-26 NOTE — Progress Notes (Signed)
FPTS Brief Progress Note  S:S:Went bedside to see patient. Patient sleeping, did not disturb.   O: BP 90/69 (BP Location: Right Arm)   Pulse 100   Temp 97.8 F (36.6 C) (Oral)   Resp 18   Ht 5' 2"  (1.575 m)   Wt 56.2 kg   SpO2 100%   BMI 22.68 kg/m     A/P: - Plans per day team - Orders reviewed. Labs for AM not ordered, which was adjusted as needed.   Holley Bouche, MD 10/26/2021, 1:55 AM PGY-1, [facility] Night Resident  Please page [phone] with questions.

## 2021-10-27 DIAGNOSIS — E162 Hypoglycemia, unspecified: Secondary | ICD-10-CM | POA: Diagnosis not present

## 2021-10-27 LAB — GLUCOSE, CAPILLARY
Glucose-Capillary: 141 mg/dL — ABNORMAL HIGH (ref 70–99)
Glucose-Capillary: 169 mg/dL — ABNORMAL HIGH (ref 70–99)

## 2021-10-27 MED ORDER — GLIPIZIDE 5 MG PO TABS
2.5000 mg | ORAL_TABLET | Freq: Two times a day (BID) | ORAL | Status: DC
  Administered 2021-10-27 – 2021-10-29 (×5): 2.5 mg via ORAL
  Filled 2021-10-27 (×5): qty 1

## 2021-10-27 NOTE — Assessment & Plan Note (Signed)
Patient unable to discharge to group home due to report of unsafe living conditions

## 2021-10-27 NOTE — Progress Notes (Signed)
Daily Progress Note Intern Pager: [phone]  Patient name: [name] Medical record number: [id number] Date of birth: 01/17/1976 Age: 46 y.o. Gender: female  Primary Care Provider: Leeanne Rio, MD Consultants: Transitions of care Code Status: Full code  Assessment and Plan:  Patient initially presented with hypoglycemia and AKI thought to be due to gastroparesis, this has since resolved and patient remains hospitalized due to unsafe discharge conditions of her group home. Pertinent PMH/PSH includes type 2 diabetes, intellectual delay, schizophrenia, CKD stage III and seizures.   * Hypoglycemia-resolved as of 10/27/2021 Patient with type 2 diabetes, who presented with hypoglycemia, after 2 days of decreased PO intake 2/2 vomiting and diarrhea for 2 days prior to admission as well as continued antihyperglycemic therapy. Held DM meds throughout admission, and hypoglycemia resolved after patient relieved significant stool burden seen on CT abdomen, which also resolved vomiting. Will continue to monitor and follow up AM CBG check. -F/u AM CBG check - Daily AM CBG monitoring while patient remains in the hospital -Continue Jardiance and Tradjenta  Type 2 diabetes mellitus with renal manifestations ([place]) Previously resolved hypoglycemia.  Patient noted to have CBG of 256. -Continue home Jardiance 10 mg daily -Continue home Tradjenta 5 mg daily -We will add glipizide 2.5 mg twice daily   AKI (acute kidney injury) ([place]) Patient with history of CKD III, baseline Cr 1.7-1.9. Cr on admission 3.00, improved to 2.09 on last day of blood check 06/26/2021  Hypoalbuminemia Consistently low albumin since November 2022, ranging from 2.0 (this admission) to 3.2.  Last normal in 2021.  UA negative for protein.  May be 2/2 restrictive diet versus poor p.o. intake.  Concerns about nutritional status in living facility. -RD outpatient follow-up.  CONSTIPATION, CHRONIC Chronic constipation.   -[place] and MiraLAX daily -Patient has received enema  Intellectual disability, under guardianship Patient unable to discharge to group home due to report of unsafe living conditions   Urinary retention-resolved as of 10/21/2021 CT report of significant bladder distention to 17 cm, patient I/O cathed, with 700 cc UOP. Patient psychiatric medications (Ingrezza, robinul, clozapine) have anticholinergic effects and could be causing the retention.  Patient with urinary incontinence on the a.m. of 6/12, and postvoid bladder scan 60 cc.  No further retention reported afterward.     FEN/GI: Regular diet  Dispo:Home  pending safe arrangements with patient's home environment .  Subjective:  Patient briefly wakes, reports pain in her left side but unable to discuss further as she quickly returned to sleep.   Objective: Temp:  [97.8 F (36.6 C)-98.9 F (37.2 C)] 97.8 F (36.6 C) (06/18 0334) Pulse Rate:  [88-121] 100 (06/18 0334) Resp:  [18-20] 20 (06/18 0334) BP: (83-105)/(49-70) 90/62 (06/18 0334) SpO2:  [96 %] 96 % (06/17 1900)  Physical Exam: General: Female appearing stated age in no acute distress,sleeping on the initially during exam Cardiovascular: Regular rate and rhythm without murmur Respiratory: Clear to auscultation bilaterally with no wheezing, stable on room air Abdomen: Soft, nontender Extremities: No lower extremity edema Mental status  Laboratory: Most recent CBC Lab Results  Component Value Date   WBC 7.6 10/23/2021   HGB 8.2 (L) 10/23/2021   HCT 25.0 (L) 10/23/2021   MCV 95.8 10/23/2021   PLT 99 (L) 10/23/2021   Most recent BMP    Latest Ref Rng & Units 10/24/2021    9:31 AM  BMP  Glucose 70 - 99 mg/dL 169   BUN 6 - 20 mg/dL 9  Creatinine 0.44 - 1.00 mg/dL 2.09   Sodium 135 - 145 mmol/L 148   Potassium 3.5 - 5.1 mmol/L 5.1   Chloride 98 - 111 mmol/L 116   CO2 22 - 32 mmol/L 27   Calcium 8.9 - 10.3 mg/dL 8.4      Imaging/Diagnostic Tests: No new  imaging in the last 24 hours  Simmons-Robinson, Riki Sheer, MD 10/27/2021, 7:04 AM  PGY-3, [place] Intern pager: [phone], text pages welcome Secure chat group [place]

## 2021-10-27 NOTE — Plan of Care (Signed)
  Problem: Coping: Goal: Ability to adjust to condition or change in health will improve Outcome: Progressing   Problem: Nutritional: Goal: Maintenance of adequate nutrition will improve Outcome: Progressing   Problem: Clinical Measurements: Goal: Will remain free from infection Outcome: Progressing   Problem: Clinical Measurements: Goal: Diagnostic test results will improve Outcome: Progressing   Problem: Nutrition: Goal: Adequate nutrition will be maintained Outcome: Progressing   Problem: Coping: Goal: Level of anxiety will decrease Outcome: Progressing

## 2021-10-28 DIAGNOSIS — E162 Hypoglycemia, unspecified: Secondary | ICD-10-CM | POA: Diagnosis not present

## 2021-10-28 DIAGNOSIS — N179 Acute kidney failure, unspecified: Secondary | ICD-10-CM | POA: Diagnosis not present

## 2021-10-28 LAB — GLUCOSE, CAPILLARY
Glucose-Capillary: 176 mg/dL — ABNORMAL HIGH (ref 70–99)
Glucose-Capillary: 113 mg/dL — ABNORMAL HIGH (ref 70–99)
Glucose-Capillary: 145 mg/dL — ABNORMAL HIGH (ref 70–99)

## 2021-10-28 NOTE — Plan of Care (Signed)
  Problem: Fluid Volume: Goal: Ability to maintain a balanced intake and output will improve Outcome: Progressing   Problem: Coping: Goal: Ability to adjust to condition or change in health will improve Outcome: Progressing   Problem: Clinical Measurements: Goal: Will remain free from infection Outcome: Progressing

## 2021-10-28 NOTE — Progress Notes (Signed)
FPTS Brief Note Reviewed patient's vitals, recent notes.    At this time, no change in plan from day progress note.   Donney Dice, DO Page [phone] with questions about this patient.

## 2021-10-28 NOTE — Progress Notes (Signed)
FPTS Brief Note Reviewed patient's vitals, recent notes.  Vitals:   10/27/21 1632 10/27/21 1706  BP: (!) 99/52 115/69  Pulse: (!) 115 92  Resp: 18 18  Temp:  97.8 F (36.6 C)  SpO2:  98%   At this time, no change in plan from day progress note.   Donney Dice, DO Page [phone] with questions about this patient.

## 2021-10-28 NOTE — TOC Progression Note (Signed)
Transition of Care [facility]) - Progression Note    Patient Details  Name: Shavette Shoaff MRN: [id number] Date of Birth: 1976-02-26  Transition of Care [facility]) CM/SW [place], LCSW Phone Number: 10/28/2021, 11:52 AM  Clinical Narrative:    CSW left voicemail for [name] [employer] [phone]), patient's legal guardian, to check status of AFL.    Expected Discharge Plan: Group Home Barriers to Discharge: No Barriers Identified  Expected Discharge Plan and Services Expected Discharge Plan: Group Home         Expected Discharge Date: 10/23/21                                     Social Determinants of Health (SDOH) Interventions    Readmission Risk Interventions     No data to display

## 2021-10-28 NOTE — Progress Notes (Addendum)
     Daily Progress Note Intern Pager: [phone]  Patient name: [name] Medical record number: [id number] Date of birth: June 06, 1975 Age: 46 y.o. Gender: female  Primary Care Provider: Leeanne Rio, MD Consultants: [facility] Code Status: Full  Pt Overview and Major Events to Date:  10/20/21 - Admitted 10/21/21 - urinary retention resolved  10/23/21 - group home uninhabitable preventing discharge  Assessment and Plan:  46 y.o. female initially presented with hypoglycemia and AKI thought to be due to gastroparesis, this has since resolved and patient remains hospitalized due to unsafe discharge conditions of her group home. Pertinent PMH/PSH includes type 2 diabetes, intellectual delay, schizophrenia, CKD stage III and seizures.   Pt is medically stable for discharge.   Type 2 diabetes mellitus with renal manifestations (HCC) Previous hypoglycemia resolved.  CBG 113 this morning -Continue home Jardiance 10 mg daily -Continue home Tradjenta 5 mg daily -We will add glipizide 2.5 mg twice daily   AKI (acute kidney injury) ([place]) Patient with history of CKD III, baseline Cr 1.7-1.9. Cr on admission 3.00, improved to 2.09 on last day of blood check 06/26/2021  Hypoalbuminemia Consistently low albumin since November 2022, ranging from 2.0 (this admission) to 3.2.  Last normal in 2021.  UA negative for protein.  May be 2/2 restrictive diet versus poor p.o. intake.  Concerns about nutritional status in living facility. -RD outpatient follow-up.  CONSTIPATION, CHRONIC Chronic constipation. S/p enema. -Senna and MiraLAX daily   Intellectual disability, under guardianship Patient unable to discharge to group home due to report of unsafe living conditions   Urinary retention-resolved as of 10/21/2021       FEN/GI: Regular diet PPx: Lovenox Dispo: Home pending safe arrangements with group home  Subjective:  Patient has no complaints this morning  Objective: Temp:  [97.6  F (36.4 C)-98 F (36.7 C)] 98 F (36.7 C) (06/19 1234) Pulse Rate:  [90-115] 100 (06/19 1234) Resp:  [16-18] 16 (06/19 1234) BP: (87-115)/(52-69) 87/61 (06/19 1234) SpO2:  [98 %-99 %] 98 % (06/19 1234) Physical Exam: General: Lying in bed, NAD Cardiovascular: RRR, normal S1/S2 Respiratory: CTAB, normal effort Abdomen: Bowel sounds present, soft, nontender palpation.   Laboratory: Most recent CBC Lab Results  Component Value Date   WBC 7.6 10/23/2021   HGB 8.2 (L) 10/23/2021   HCT 25.0 (L) 10/23/2021   MCV 95.8 10/23/2021   PLT 99 (L) 10/23/2021   Most recent BMP    Latest Ref Rng & Units 10/24/2021    9:31 AM  BMP  Glucose 70 - 99 mg/dL 169   BUN 6 - 20 mg/dL 9   Creatinine 0.44 - 1.00 mg/dL 2.09   Sodium 135 - 145 mmol/L 148   Potassium 3.5 - 5.1 mmol/L 5.1   Chloride 98 - 111 mmol/L 116   CO2 22 - 32 mmol/L 27   Calcium 8.9 - 10.3 mg/dL 8.4      Precious Gilding, DO 10/28/2021, 2:10 PM  PGY-1, [place] Intern pager: [phone], text pages welcome Secure chat group [place]

## 2021-10-29 DIAGNOSIS — E162 Hypoglycemia, unspecified: Secondary | ICD-10-CM | POA: Diagnosis not present

## 2021-10-29 LAB — GLUCOSE, CAPILLARY
Glucose-Capillary: 84 mg/dL (ref 70–99)
Glucose-Capillary: 124 mg/dL — ABNORMAL HIGH (ref 70–99)

## 2021-10-29 MED ORDER — GLIPIZIDE 5 MG PO TABS
2.5000 mg | ORAL_TABLET | Freq: Every day | ORAL | Status: DC
Start: 2021-10-30 — End: 2021-10-30
  Administered 2021-10-30: 2.5 mg via ORAL
  Filled 2021-10-29: qty 1

## 2021-10-29 NOTE — Progress Notes (Signed)
     Daily Progress Note Intern Pager: [phone]  Patient name: [name] Medical record number: [id number] Date of birth: 05-Jun-1975 Age: 46 y.o. Gender: female  Primary Care Provider: Leeanne Rio, MD Consultants: [facility] Code Status: Full  Pt Overview and Major Events to Date:  10/20/21 - Admitted 10/21/21 - urinary retention resolved  10/23/21 - group home uninhabitable preventing discharge  Assessment and Plan: 46 y.o. female initially presented with hypoglycemia and AKI thought to be due to gastroparesis, this has since resolved and patient remains hospitalized due to unsafe discharge conditions of her group home. Pertinent PMH/PSH includes type 2 diabetes, intellectual delay, schizophrenia, CKD stage III and seizures.    Pt is medically stable for discharge.   Type 2 diabetes mellitus with renal manifestations (HCC) Previous hypoglycemia resolved.  CBG 84 this morning -Continue home Jardiance 10 mg daily -Continue home Tradjenta 5 mg daily -Decrease glipizide 2.5 mg to once daily   Hypoalbuminemia Consistently low albumin since November 2022, ranging from 2.0 (this admission) to 3.2.  Last normal in 2021.  UA negative for protein.  May be 2/2 restrictive diet versus poor p.o. intake.  Concerns about nutritional status in living facility. -RD outpatient follow-up.  CONSTIPATION, CHRONIC Chronic constipation. S/p enema. -Senna and MiraLAX daily   Intellectual disability, under guardianship Patient unable to discharge to group home due to report of unsafe living conditions     FEN/GI: Regular diet PPx: Lovenox Dispo: New living facility versus previously in facility if conditions are safe  Subjective:  Patient sleeping soundly  Objective: Temp:  [97.4 F (36.3 C)-98.4 F (36.9 C)] 97.7 F (36.5 C) (06/20 1053) Pulse Rate:  [99-100] 99 (06/20 1053) Resp:  [16-18] 16 (06/20 1053) BP: (94-109)/(53-74) 107/74 (06/20 1053) SpO2:  [97 %-100 %] 100 % (06/19  2335) Physical Exam: General: 46 year old female, sleeping soundly, NAD Cardiovascular: Well perfused Respiratory: Breathing comfortably on room air   Laboratory: Most recent CBC Lab Results  Component Value Date   WBC 7.6 10/23/2021   HGB 8.2 (L) 10/23/2021   HCT 25.0 (L) 10/23/2021   MCV 95.8 10/23/2021   PLT 99 (L) 10/23/2021   Most recent BMP    Latest Ref Rng & Units 10/24/2021    9:31 AM  BMP  Glucose 70 - 99 mg/dL 169   BUN 6 - 20 mg/dL 9   Creatinine 0.44 - 1.00 mg/dL 2.09   Sodium 135 - 145 mmol/L 148   Potassium 3.5 - 5.1 mmol/L 5.1   Chloride 98 - 111 mmol/L 116   CO2 22 - 32 mmol/L 27   Calcium 8.9 - 10.3 mg/dL 8.4      Precious Gilding, DO 10/29/2021, 1:33 PM  PGY-1, [place] Intern pager: [phone], text pages welcome Secure chat group [place]

## 2021-10-29 NOTE — Progress Notes (Signed)
FPTS Brief Note Reviewed patient's vitals, recent notes.  Vitals:   10/29/21 1928 10/29/21 2121  BP: 100/65 104/75  Pulse: (!) 111 99  Resp:    Temp: 99.1 F (37.3 C)   SpO2: 100% 100%   At this time, no change in plan from day progress note.   Donney Dice, DO Page [phone] with questions about this patient.

## 2021-10-30 ENCOUNTER — Other Ambulatory Visit (HOSPITAL_COMMUNITY): Payer: Self-pay

## 2021-10-30 DIAGNOSIS — E162 Hypoglycemia, unspecified: Secondary | ICD-10-CM | POA: Diagnosis not present

## 2021-10-30 LAB — GLUCOSE, CAPILLARY
Glucose-Capillary: 116 mg/dL — ABNORMAL HIGH (ref 70–99)
Glucose-Capillary: 155 mg/dL — ABNORMAL HIGH (ref 70–99)

## 2021-10-30 MED ORDER — GLIPIZIDE 5 MG PO TABS: 2.5000 mg | ORAL_TABLET | Freq: Every day | ORAL | 0 refills | Status: DC

## 2021-10-30 MED ORDER — ADULT MULTIVITAMIN W/MINERALS CH: 1.0000 | ORAL_TABLET | Freq: Every day | ORAL | 0 refills | Status: DC

## 2021-10-30 NOTE — Discharge Summary (Addendum)
[facility] Discharge Summary  Patient name: [name] Medical record number: [id number] Date of birth: Jun 14, 1975 Age: 46 y.o. Gender: female Date of Admission: 10/20/2021  Date of Discharge: 10/30/21 Admitting Physician: Rosezetta Schlatter, MD  Primary Care Provider: Leeanne Rio, MD Consultants: [facility]  Indication for Hospitalization: Hypoglycemia  Discharge Diagnoses/Problem List:  Active Problems:   Type 2 diabetes mellitus with renal manifestations ([place])   Hypoglycemia   Intellectual disability, under guardianship   CONSTIPATION, CHRONIC   Malnutrition of moderate degree    Disposition: ALF  Discharge Condition: Stable  Discharge Exam:  General: 46 year old female, sleeping, NAD Cardiovascular: Well perfused Respiratory: Breathing comfortably on room air  Brief Hospital Course:  Type 2 diabetes mellitus with renal manifestations ([place]) Hypoglycemia-resolved on day of discharge Patient with type 2 diabetes, who presented with hypoglycemia, after 2 days of decreased PO intake 2/2 vomiting and diarrhea for 2 days prior to admission as well as continued antihyperglycemic therapy. Home medications: Jardiance 10, Januvia 50, Glipizide 10 mg.  Symptoms likely due to obstipation versus gastroenteritis, given hx and symptoms.  Held DM meds until hypoglycemia resolved after patient relieved significant stool burden seen on CT abdomen, which also resolved vomiting.  Upon discharge, glipizide was decreased to 2.57m daily, Jardiance resumed, and Januvia switched toTradjenta 5 mg daily, as Januvia is renally cleared.   Hypoalbuminemia Consistently low albumin since November 2022, ranging from 2.0 (this admission) to 3.2.  Last normal in 2021.  UA negative for protein.  May be 2/2 restrictive diet versus poor p.o. intake.  Concerns about nutritional status in living facility, advised RD outpatient follow-up.   CONSTIPATION, CHRONIC Patient taking colace  100 mg daily and miralax 17 g daily at home, large stool burden noted on CT abdomen on admission.  Chronic constipation likely in the setting of Clozaril use.  Patient with 6 medium to large bowel movements over the course of admission once increased dose of home meds to MiraLAX twice daily, and switched to Senokot-S 2 tablets twice daily.  Upon discharge, will resume MiraLAX once daily and Senokot-S 1 tablet daily.  AKI (acute kidney injury) (HCC)-resolved by day of discharge Patient with history of CKD III, baseline Cr 1.7-1.9. Cr on admission 3.00, 2.09 on day of discharge.  AKI was likely pre-renal in setting of poor PO intake and vomiting.     Urinary retention-resolved as of 10/21/2021  CT report of significant bladder distention to 17 cm, patient I/O cathed, with 700 cc UOP. Patient psychiatric medications (Ingrezza, robinul, clozapine) have anticholinergic effects and could be causing the retention.  Patient with urinary incontinence on the a.m. of 6/12, and postvoid bladder scan 60 cc.  No further retention reported afterward.   Issues for follow-up: 1.  Blood pressure-patient with hypotensive to normotensive BP throughout admission.  Home metoprolol discontinued.  Consider resuming outpatient. 2.  Continue glucose monitoring outpatient  3. Repeat CBC in approx one week for Hgb 8.2. 4.  Nutritional status-patient with hypo-albuminemia since November 2022.  Consider assessing dietary needs with RD   Significant Procedures: none  Significant Labs and Imaging:  No results for input(s): "WBC", "HGB", "HCT", "PLT" in the last 168 hours. Recent Labs  Lab 10/24/21 0931  NA 148*  K 5.1  CL 116*  CO2 27  GLUCOSE 169*  BUN 9  CREATININE 2.09*  CALCIUM 8.4*      Results/Tests Pending at Time of Discharge: None  Discharge Medications:  Allergies as of 10/30/2021  Reactions   Minocycline    Unknown         Medication List     STOP taking these medications     docusate sodium 100 MG capsule Commonly known as: COLACE   metoprolol succinate 25 MG 24 hr tablet Commonly known as: TOPROL-XL   sitaGLIPtin 50 MG tablet Commonly known as: Januvia   Valtoco 15 MG Dose 2 x 7.5 MG/0.1ML Lqpk Generic drug: diazePAM (15 MG Dose)       TAKE these medications    acetaminophen 500 MG tablet Commonly known as: GoodSense Pain Relief Extra St Take 1 tablet (500 mg total) by mouth every 8 (eight) hours as needed for mild pain or fever.   aspirin 81 MG chewable tablet Commonly known as: GoodSense Aspirin Chew 1 tablet (81 mg total) by mouth daily.   atorvastatin 20 MG tablet Commonly known as: LIPITOR Take 1 tablet (20 mg total) by mouth at bedtime.   cetaphil cream APPLY DAILY What changed:  how much to take how to take this   cloZAPine 100 MG tablet Commonly known as: CLOZARIL Take 4 tablets (400 mg total) by mouth at bedtime for 7 days.   divalproex 500 MG 24 hr tablet Commonly known as: DEPAKOTE ER Take 2,000 mg by mouth at bedtime.   famotidine 20 MG tablet Commonly known as: PEPCID Take 1 tablet (20 mg total) by mouth 2 (two) times daily.   glipiZIDE 5 MG tablet Commonly known as: GLUCOTROL Take 0.5 tablets (2.5 mg total) by mouth daily before breakfast. Start taking on: October 31, 2021 What changed:  medication strength See the new instructions.   glycopyrrolate 1 MG tablet Commonly known as: ROBINUL Take 1 mg by mouth daily.   Incontinence Brief Medium Misc Wear every night   Ingrezza 80 MG capsule Generic drug: valbenazine Take 80 mg by mouth at bedtime.   Jardiance 10 MG Tabs tablet Generic drug: empagliflozin TAKE ONE TABLET BY MOUTH ONCE DAILY   medroxyPROGESTERone 150 MG/ML injection Commonly known as: DEPO-PROVERA Inject 150 mg into the muscle every 3 (three) months.   multivitamin with minerals Tabs tablet Take 1 tablet by mouth daily. Start taking on: October 31, 8936   ONE TOUCH DELICA LANCING DEV  Misc Check blood sugar once daily   OneTouch Delica Lancets 10F Misc Check blood sugar once daily   OneTouch Verio test strip Generic drug: glucose blood CHECK BLOOD GLUCOSE (SUGAR) EVERY DAY   OneTouch Verio w/Device Kit Check blood sugar once daily   PARoxetine 10 MG tablet Commonly known as: PAXIL Take 10 mg by mouth daily.   polyethylene glycol powder 17 GM/SCOOP powder Commonly known as: GLYCOLAX/MIRALAX Take 17 g by mouth at bedtime.   QUEtiapine 50 MG tablet Commonly known as: SEROQUEL Take 50 mg by mouth at bedtime.   Senexon-S 8.6-50 MG tablet Generic drug: senna-docusate Take 1 tablet by mouth at bedtime.   simethicone 125 MG chewable tablet Commonly known as: Gas-X Extra Strength Chew 1 tablet (125 mg total) by mouth daily as needed for flatulence.   Tradjenta 5 MG Tabs tablet Generic drug: linagliptin Take 1 tablet (5 mg total) by mouth daily.        Discharge Instructions: Please refer to Patient Instructions section of EMR for full details.  Patient was counseled important signs and symptoms that should prompt return to medical care, changes in medications, dietary instructions, activity restrictions, and follow up appointments.   Follow-Up Appointments:   Precious Gilding, DO 10/30/2021,  12:24 PM PGY-1, [place]

## 2021-10-30 NOTE — Progress Notes (Signed)
     Daily Progress Note Intern Pager: [phone]  Patient name: [name] Medical record number: [id number] Date of birth: August 15, 1975 Age: 46 y.o. Gender: female  Primary Care Provider: Leeanne Rio, MD Consultants: [facility] Code Status: Full  Pt Overview and Major Events to Date:  10/20/21 - Admitted 10/21/21 - urinary retention resolved  10/23/21 - group home uninhabitable preventing discharge  Assessment and Plan: 46 y.o. female initially presented with hypoglycemia and AKI thought to be due to gastroparesis, this has since resolved and patient remains hospitalized due to unsafe discharge conditions of her group home. Pertinent PMH/PSH includes type 2 diabetes, intellectual delay, schizophrenia, CKD stage III and seizures.    Pt is medically stable for discharge.   Type 2 diabetes mellitus with renal manifestations (HCC) Previous hypoglycemia resolved.  CBG 116 this morning -Continue home Jardiance 10 mg daily -Continue home Tradjenta 5 mg daily -Continue glipizide 2.5 mg to once daily   Hypoalbuminemia Consistently low albumin since November 2022, ranging from 2.0 (this admission) to 3.2.  Last normal in 2021.  UA negative for protein.  May be 2/2 restrictive diet versus poor p.o. intake.  Concerns about nutritional status in living facility. -RD outpatient follow-up.  CONSTIPATION, CHRONIC Chronic constipation. S/p enema. -Senna and MiraLAX daily   Intellectual disability, under guardianship Patient unable to discharge to group home due to report of unsafe living conditions    FEN/GI: Regular diet PPx: Lovenox Dispo: Facility versus previous living facility if conditions are safe  Subjective:  Patient sleeping soundly  Objective: Temp:  [97.7 F (36.5 C)-99.1 F (37.3 C)] 98.3 F (36.8 C) (06/21 0836) Pulse Rate:  [99-111] 100 (06/21 0836) Resp:  [16] 16 (06/20 1053) BP: (100-113)/(65-81) 113/81 (06/21 0836) SpO2:  [98 %-100 %] 98 % (06/21  0836) Physical Exam: General: 47 year old female, sleeping, NAD Cardiovascular: Well perfused Respiratory: Breathing comfortably on room air   Laboratory: Most recent CBC Lab Results  Component Value Date   WBC 7.6 10/23/2021   HGB 8.2 (L) 10/23/2021   HCT 25.0 (L) 10/23/2021   MCV 95.8 10/23/2021   PLT 99 (L) 10/23/2021   Most recent BMP    Latest Ref Rng & Units 10/24/2021    9:31 AM  BMP  Glucose 70 - 99 mg/dL 169   BUN 6 - 20 mg/dL 9   Creatinine 0.44 - 1.00 mg/dL 2.09   Sodium 135 - 145 mmol/L 148   Potassium 3.5 - 5.1 mmol/L 5.1   Chloride 98 - 111 mmol/L 116   CO2 22 - 32 mmol/L 27   Calcium 8.9 - 10.3 mg/dL 8.4     Precious Gilding, DO 10/30/2021, 9:47 AM  PGY-1, [place] Intern pager: [phone], text pages welcome Secure chat group [place]

## 2021-10-30 NOTE — TOC Transition Note (Signed)
Transition of Care [facility]) - CM/SW Discharge Note   Patient Details  Name: [name] MRN: [id number] Date of Birth: August 01, 1975  [facility] CM/SW Contact:  Curlene Labrum, RN Phone Number: 10/30/2021, 12:53 PM   Clinical Narrative:    CM called and spoke with [name], Legal Guardian with [facility] at [phone] and she states that she spoke with [facility] and emergency alternative family home was established for the patient and she will be able to discharge home today to the new facility - located at [address] [place], [place] [address].  I called and spoke with the Provider at the Home [facility] - [name] and she is aware that patient will be discharged home and she will pick the patient up for discharge today by private vehicle.    Discharge medications will be called in to her regular home pharmacy and the caregiver will pick them up.  CM and MSW with DTP Team will continue to follow the patient for discharge planning.    Final next level of care:  (Alternative [place]) Barriers to Discharge: No Barriers Identified   Patient Goals and CMS Choice Patient states their goals for this hospitalization and ongoing recovery are:: Returning to [place] CMS Medicare.gov Compare Post Acute Care list provided to:: Legal Guardian [name], Legal [place] [phone]) Choice offered to / list presented to : [place] / [place]  Discharge Placement  Discharge home to new [facility] [place], [place], [place] [address] - contact - [name] [phone]                     Discharge Plan and Services In-house Referral: Clinical Social Work, PCP / Psychologist, educational Discharge Planning Services: CM Consult Post Acute Care Choice: Resumption of Svcs/PTA Provider                               Social Determinants of Health (SDOH)  Interventions     Readmission Risk Interventions    10/30/2021   12:52 PM  Readmission Risk Prevention Plan  Transportation Screening Complete  PCP or Specialist Appt within 5-7 Days Complete  Home Care Screening Complete  Medication Review (RN CM) Complete

## 2021-11-01 DIAGNOSIS — F250 Schizoaffective disorder, bipolar type: Secondary | ICD-10-CM | POA: Diagnosis not present

## 2021-11-01 DIAGNOSIS — F70 Mild intellectual disabilities: Secondary | ICD-10-CM | POA: Diagnosis not present

## 2021-11-06 ENCOUNTER — Other Ambulatory Visit: Payer: Self-pay | Admitting: Family Medicine

## 2021-11-14 ENCOUNTER — Ambulatory Visit: Payer: Medicare Other | Admitting: Family Medicine

## 2021-11-14 DIAGNOSIS — F70 Mild intellectual disabilities: Secondary | ICD-10-CM | POA: Diagnosis not present

## 2021-11-14 DIAGNOSIS — F250 Schizoaffective disorder, bipolar type: Secondary | ICD-10-CM | POA: Diagnosis not present

## 2021-11-14 DIAGNOSIS — D649 Anemia, unspecified: Secondary | ICD-10-CM | POA: Diagnosis not present

## 2021-11-15 ENCOUNTER — Observation Stay (HOSPITAL_COMMUNITY): Payer: Medicare Other

## 2021-11-15 ENCOUNTER — Encounter (HOSPITAL_COMMUNITY): Payer: Self-pay | Admitting: Radiology

## 2021-11-15 ENCOUNTER — Encounter (HOSPITAL_COMMUNITY): Payer: Self-pay

## 2021-11-15 ENCOUNTER — Inpatient Hospital Stay (HOSPITAL_COMMUNITY)
Admission: EM | Admit: 2021-11-15 | Discharge: 2021-11-17 | DRG: 684 | Disposition: A | Discharge status: Home / Self Care | Payer: Medicare Other | Attending: Family Medicine | Admitting: Family Medicine

## 2021-11-15 ENCOUNTER — Other Ambulatory Visit: Payer: Self-pay

## 2021-11-15 ENCOUNTER — Ambulatory Visit (HOSPITAL_COMMUNITY)
Admission: EM | Admit: 2021-11-15 | Discharge: 2021-11-15 | Disposition: A | Discharge status: Home / Self Care | Payer: Medicare Other | Attending: Family Medicine | Admitting: Family Medicine

## 2021-11-15 DIAGNOSIS — I959 Hypotension, unspecified: Secondary | ICD-10-CM | POA: Diagnosis not present

## 2021-11-15 DIAGNOSIS — E1122 Type 2 diabetes mellitus with diabetic chronic kidney disease: Secondary | ICD-10-CM | POA: Diagnosis present

## 2021-11-15 DIAGNOSIS — Z85528 Personal history of other malignant neoplasm of kidney: Secondary | ICD-10-CM | POA: Diagnosis not present

## 2021-11-15 DIAGNOSIS — D696 Thrombocytopenia, unspecified: Secondary | ICD-10-CM | POA: Diagnosis present

## 2021-11-15 DIAGNOSIS — E861 Hypovolemia: Secondary | ICD-10-CM | POA: Diagnosis not present

## 2021-11-15 DIAGNOSIS — Z888 Allergy status to other drugs, medicaments and biological substances status: Secondary | ICD-10-CM | POA: Diagnosis not present

## 2021-11-15 DIAGNOSIS — N179 Acute kidney failure, unspecified: Secondary | ICD-10-CM | POA: Diagnosis present

## 2021-11-15 DIAGNOSIS — E785 Hyperlipidemia, unspecified: Secondary | ICD-10-CM | POA: Diagnosis present

## 2021-11-15 DIAGNOSIS — E860 Dehydration: Secondary | ICD-10-CM | POA: Diagnosis present

## 2021-11-15 DIAGNOSIS — Z7984 Long term (current) use of oral hypoglycemic drugs: Secondary | ICD-10-CM

## 2021-11-15 DIAGNOSIS — F209 Schizophrenia, unspecified: Secondary | ICD-10-CM | POA: Diagnosis not present

## 2021-11-15 DIAGNOSIS — E872 Acidosis, unspecified: Secondary | ICD-10-CM | POA: Diagnosis present

## 2021-11-15 DIAGNOSIS — R000 Tachycardia, unspecified: Secondary | ICD-10-CM | POA: Diagnosis not present

## 2021-11-15 DIAGNOSIS — K5909 Other constipation: Secondary | ICD-10-CM | POA: Diagnosis present

## 2021-11-15 DIAGNOSIS — K6389 Other specified diseases of intestine: Secondary | ICD-10-CM | POA: Diagnosis not present

## 2021-11-15 DIAGNOSIS — E8721 Acute metabolic acidosis: Secondary | ICD-10-CM

## 2021-11-15 DIAGNOSIS — N1832 Chronic kidney disease, stage 3b: Secondary | ICD-10-CM | POA: Diagnosis present

## 2021-11-15 DIAGNOSIS — G2401 Drug induced subacute dyskinesia: Secondary | ICD-10-CM | POA: Diagnosis not present

## 2021-11-15 DIAGNOSIS — R1110 Vomiting, unspecified: Principal | ICD-10-CM

## 2021-11-15 DIAGNOSIS — Z79899 Other long term (current) drug therapy: Secondary | ICD-10-CM | POA: Diagnosis not present

## 2021-11-15 DIAGNOSIS — I9589 Other hypotension: Secondary | ICD-10-CM | POA: Diagnosis not present

## 2021-11-15 DIAGNOSIS — K828 Other specified diseases of gallbladder: Secondary | ICD-10-CM | POA: Diagnosis not present

## 2021-11-15 DIAGNOSIS — R112 Nausea with vomiting, unspecified: Secondary | ICD-10-CM | POA: Diagnosis not present

## 2021-11-15 DIAGNOSIS — E1129 Type 2 diabetes mellitus with other diabetic kidney complication: Secondary | ICD-10-CM | POA: Diagnosis present

## 2021-11-15 DIAGNOSIS — K5900 Constipation, unspecified: Secondary | ICD-10-CM | POA: Diagnosis present

## 2021-11-15 DIAGNOSIS — E870 Hyperosmolality and hypernatremia: Secondary | ICD-10-CM

## 2021-11-15 DIAGNOSIS — D539 Nutritional anemia, unspecified: Secondary | ICD-10-CM

## 2021-11-15 LAB — URINALYSIS, ROUTINE W REFLEX MICROSCOPIC
Bacteria, UA: NONE SEEN
Bilirubin Urine: NEGATIVE
Glucose, UA: >=500 mg/dL — AB
Hgb urine dipstick: NEGATIVE
Ketones, ur: NEGATIVE mg/dL
Leukocytes,Ua: NEGATIVE
Nitrite: NEGATIVE
Protein, ur: NEGATIVE mg/dL
Specific Gravity, Urine: 1.008 (ref 1.005–1.030)
pH: 5 (ref 5.0–8.0)

## 2021-11-15 LAB — CBC WITH DIFFERENTIAL/PLATELET
Abs Immature Granulocytes: 0.06 10*3/uL (ref 0.00–0.07)
Abs Immature Granulocytes: 0.45 10*3/uL — ABNORMAL HIGH (ref 0.00–0.07)
Basophils Absolute: 0 10*3/uL (ref 0.0–0.1)
Basophils Absolute: 0 10*3/uL (ref 0.0–0.1)
Basophils Relative: 0 %
Basophils Relative: 0 %
Eosinophils Absolute: 0 10*3/uL (ref 0.0–0.5)
Eosinophils Absolute: 0.1 10*3/uL (ref 0.0–0.5)
Eosinophils Relative: 0 %
Eosinophils Relative: 1 %
HCT: 36.1 % (ref 36.0–46.0)
HCT: 27.4 % — ABNORMAL LOW (ref 36.0–46.0)
Hemoglobin: 10.9 g/dL — ABNORMAL LOW (ref 12.0–15.0)
Hemoglobin: 8.3 g/dL — ABNORMAL LOW (ref 12.0–15.0)
Immature Granulocytes: 1 %
Immature Granulocytes: 6 %
Lymphocytes Relative: 16 %
Lymphocytes Relative: 12 %
Lymphs Abs: 0.7 10*3/uL (ref 0.7–4.0)
Lymphs Abs: 1 10*3/uL (ref 0.7–4.0)
MCH: 32 pg (ref 26.0–34.0)
MCH: 32.5 pg (ref 26.0–34.0)
MCHC: 30.2 g/dL (ref 30.0–36.0)
MCHC: 30.3 g/dL (ref 30.0–36.0)
MCV: 105.9 fL — ABNORMAL HIGH (ref 80.0–100.0)
MCV: 107.5 fL — ABNORMAL HIGH (ref 80.0–100.0)
Monocytes Absolute: 0.4 10*3/uL (ref 0.1–1.0)
Monocytes Absolute: 1.1 10*3/uL — ABNORMAL HIGH (ref 0.1–1.0)
Monocytes Relative: 8 %
Monocytes Relative: 13 %
Neutro Abs: 3.3 10*3/uL (ref 1.7–7.7)
Neutro Abs: 5.6 10*3/uL (ref 1.7–7.7)
Neutrophils Relative %: 75 %
Neutrophils Relative %: 68 %
Platelets: UNDETERMINED 10*3/uL (ref 150–400)
Platelets: 83 10*3/uL — ABNORMAL LOW (ref 150–400)
RBC: 3.41 MIL/uL — ABNORMAL LOW (ref 3.87–5.11)
RBC: 2.55 MIL/uL — ABNORMAL LOW (ref 3.87–5.11)
RDW: 16 % — ABNORMAL HIGH (ref 11.5–15.5)
RDW: 15.9 % — ABNORMAL HIGH (ref 11.5–15.5)
WBC: 4.5 10*3/uL (ref 4.0–10.5)
WBC: 8.2 10*3/uL (ref 4.0–10.5)
nRBC: 0 % (ref 0.0–0.2)
nRBC: 0 % (ref 0.0–0.2)

## 2021-11-15 LAB — COMPREHENSIVE METABOLIC PANEL
ALT: 17 U/L (ref 0–44)
AST: 26 U/L (ref 15–41)
Albumin: 2.8 g/dL — ABNORMAL LOW (ref 3.5–5.0)
Alkaline Phosphatase: 68 U/L (ref 38–126)
Anion gap: 10 (ref 5–15)
BUN: 24 mg/dL — ABNORMAL HIGH (ref 6–20)
CO2: 19 mmol/L — ABNORMAL LOW (ref 22–32)
Calcium: 8.5 mg/dL — ABNORMAL LOW (ref 8.9–10.3)
Chloride: 114 mmol/L — ABNORMAL HIGH (ref 98–111)
Creatinine, Ser: 2.86 mg/dL — ABNORMAL HIGH (ref 0.44–1.00)
GFR, Estimated: 20 mL/min — ABNORMAL LOW (ref >60)
Glucose, Bld: 218 mg/dL — ABNORMAL HIGH (ref 70–99)
Potassium: 4.5 mmol/L (ref 3.5–5.1)
Sodium: 143 mmol/L (ref 135–145)
Total Bilirubin: 0.6 mg/dL (ref 0.3–1.2)
Total Protein: 6.4 g/dL — ABNORMAL LOW (ref 6.5–8.1)

## 2021-11-15 LAB — PREGNANCY, URINE: Preg Test, Ur: NEGATIVE

## 2021-11-15 LAB — LIPASE, BLOOD: Lipase: 22 U/L (ref 11–51)

## 2021-11-15 LAB — LACTIC ACID, PLASMA
Lactic Acid, Venous: 4.6 mmol/L (ref 0.5–1.9)
Lactic Acid, Venous: 4.1 mmol/L (ref 0.5–1.9)

## 2021-11-15 MED ORDER — QUETIAPINE FUMARATE 50 MG PO TABS
50.0000 mg | ORAL_TABLET | Freq: Every day | ORAL | Status: DC
  Administered 2021-11-16 (×2): 50 mg via ORAL
  Filled 2021-11-15: qty 1
  Filled 2021-11-15: qty 2

## 2021-11-15 MED ORDER — FAMOTIDINE 20 MG PO TABS
20.0000 mg | ORAL_TABLET | Freq: Two times a day (BID) | ORAL | Status: DC
  Administered 2021-11-16: 20 mg via ORAL
  Filled 2021-11-15 (×2): qty 1

## 2021-11-15 MED ORDER — VALBENAZINE TOSYLATE 40 MG PO CAPS
80.0000 mg | ORAL_CAPSULE | Freq: Every day | ORAL | Status: DC
  Administered 2021-11-16 (×2): 80 mg via ORAL
  Filled 2021-11-15 (×3): qty 2

## 2021-11-15 MED ORDER — ADULT MULTIVITAMIN W/MINERALS CH
1.0000 | ORAL_TABLET | Freq: Every day | ORAL | Status: DC
  Filled 2021-11-15 (×2): qty 1

## 2021-11-15 MED ORDER — SENNOSIDES-DOCUSATE SODIUM 8.6-50 MG PO TABS
1.0000 | ORAL_TABLET | Freq: Every day | ORAL | Status: DC
Start: 2021-11-15 — End: 2021-11-18
  Administered 2021-11-16: 1 via ORAL
  Filled 2021-11-15 (×2): qty 1

## 2021-11-15 MED ORDER — POLYETHYLENE GLYCOL 3350 17 G PO PACK
17.0000 g | PACK | Freq: Every day | ORAL | Status: DC
Start: 2021-11-15 — End: 2021-11-18
  Administered 2021-11-16: 17 g via ORAL
  Filled 2021-11-15 (×2): qty 1

## 2021-11-15 MED ORDER — ENOXAPARIN SODIUM 30 MG/0.3ML IJ SOSY: 30.0000 mg | PREFILLED_SYRINGE | INTRAMUSCULAR | Status: DC

## 2021-11-15 MED ORDER — SODIUM CHLORIDE 0.9 % IV BOLUS
1000.0000 mL | Freq: Once | INTRAVENOUS | Status: AC
  Administered 2021-11-15: 1000 mL via INTRAVENOUS

## 2021-11-15 MED ORDER — PIPERACILLIN-TAZOBACTAM 3.375 G IVPB 30 MIN
3.3750 g | Freq: Once | INTRAVENOUS | Status: AC
  Administered 2021-11-15: 3.375 g via INTRAVENOUS
  Filled 2021-11-15: qty 50

## 2021-11-15 MED ORDER — PAROXETINE HCL 10 MG PO TABS
10.0000 mg | ORAL_TABLET | Freq: Every day | ORAL | Status: DC
  Administered 2021-11-16: 10 mg via ORAL
  Filled 2021-11-15 (×2): qty 1

## 2021-11-15 MED ORDER — ONDANSETRON HCL 4 MG/2ML IJ SOLN
4.0000 mg | Freq: Once | INTRAMUSCULAR | Status: AC
  Administered 2021-11-15: 4 mg via INTRAVENOUS
  Filled 2021-11-15: qty 2

## 2021-11-15 MED ORDER — ASPIRIN 81 MG PO CHEW
81.0000 mg | CHEWABLE_TABLET | Freq: Every day | ORAL | Status: DC
  Filled 2021-11-15 (×2): qty 1

## 2021-11-15 MED ORDER — HEPARIN SODIUM (PORCINE) 5000 UNIT/ML IJ SOLN
5000.0000 [IU] | Freq: Three times a day (TID) | INTRAMUSCULAR | Status: DC
  Administered 2021-11-16 (×3): 5000 [IU] via SUBCUTANEOUS
  Filled 2021-11-15 (×4): qty 1

## 2021-11-15 MED ORDER — CLOZAPINE 100 MG PO TABS
400.0000 mg | ORAL_TABLET | Freq: Every day | ORAL | Status: DC
  Administered 2021-11-16: 400 mg via ORAL
  Filled 2021-11-15 (×2): qty 4

## 2021-11-15 MED ORDER — ATORVASTATIN CALCIUM 10 MG PO TABS
20.0000 mg | ORAL_TABLET | Freq: Every day | ORAL | Status: DC
  Administered 2021-11-16: 20 mg via ORAL
  Filled 2021-11-15 (×2): qty 2

## 2021-11-15 MED ORDER — GLYCOPYRROLATE 1 MG PO TABS
1.0000 mg | ORAL_TABLET | Freq: Every day | ORAL | Status: DC
Start: 2021-11-16 — End: 2021-11-15

## 2021-11-15 NOTE — Assessment & Plan Note (Addendum)
Lactic acid 4.6 on arrival, has improved to 4.1 after 2L bolus. Patient met SIRS criteria in the ED with hypotension and tachycardia. Blood cultures collected and she received a dose of Zosyn. Doubt sepsis at this time but will follow cultures. LA likley in the setting of dehydration secondary to emesis and decreased p.o. intake.  Possibly due to euglycemic DKA. CT Abd/pelv without acute findings. Abdominal exam not consistent with mesenteric ischemia.  - Trend lactic acid every 2 hours - Maintenance fluids - VBG, urine ketones - Hold Jardiance, Glipizide  - CBGs q. ACHS -Follow up blood cx  -No further abx at this time

## 2021-11-15 NOTE — H&P (Incomplete)
Hospital Admission History and Physical Service Pager: [phone]  Patient name: [name] Medical record number: [id number] Date of Birth: 08/18/1975 Age: 46 y.o. Gender: female  Primary Care Provider: Leeanne Rio, MD Consultants:N/A Code Status: Full code, will need to be confirmed with guardian (unable to reach at this time)  Preferred Emergency Contact:  Guardian: Jacqueline [phone] On-call guardian after-hours phone number [phone])   Chief Complaint: Nausea, vomiting  Assessment and Plan: [name] is a 46 y.o. female presenting with nausea and vomiting.  Patient with hypotension, tachycardia, elevated lactic acid.  Differential for this patient's presentation of this includes euglycemic DKA, hypoperfusion secondary to decreased p.o. intake, anticholinergic side effects of psychiatry medications.  Most likely due to decreased p.o. intake and dehydration, however euglycemic DKA possible contributing factor as patient is on SGLT-2, has been having hypoglycemic episodes and recently started new Psychiatric medication.  * Hypotension Patient hypotensive to 80s over 50s with 1 day of recurrent emesis.  Possibly secondary to dehydration, initiation of new medicine, or euglycemic DKA. Received 2L bolus in the ED and systolic's now 32D.  - VBG, urine ketones - Trend lactic acid - Continue maintenance fluids -Hold glycopyrrolate, hold Jardiance -Orthostatic vitals  Acute lactic acidosis Lactic acid 4.6 on arrival, has improved to 4.1 after 2L bolus. Likely in the setting of dehydration secondary to emesis and decreased p.o. intake.  Possibly due to euglycemic DKA. CT Abd/pelv without acute findings. Abdominal exam not consistent with mesenteric ischemia.  - Trend lactic acid every 2 hours - Maintenance fluids - VBG, urine ketones - Hold Jardiance, Glipizide  - CBGs q. 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 Most likely  pre-renal in the setting of decreased p.o. intake and dehydration, possibly worsened with euglycemic DKA.  Baseline creatinine 1.6-7, now  2.86. 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  She did have problems with urinary retention last admission so will need to monitor for post-renal causes as well. -Receiving maintenance fluids - Avoiding nephrotoxic medications -Daily BMP -Keep MAP >65 -Bladder scans q8h  Type 2 diabetes mellitus with renal manifestations (HCC) A1c in April 7.3. On Jardiance, Trajenta and glipizide. Fasting blood sugars reviewed and range 72-130 though with history of hypoglycemia in the past (also on most recent admission). Currently with lactic acidosis. - Hold Jardiance, Tradjenta, and glipizide -CBG AC and QHS -mSSI   Schizophrenia ([place]) Patient with schizophrenia requiring caregiver.  With history of tardive dyskinesia.  Recently switched to Uzendy from Depakote.  Having shivering, tongue twitches, and drooling. -Continue clozapine, Ingrezza, quetiapine, Paxil  CONSTIPATION, CHRONIC Patient with history of chronic constipation.  CTAP shows large diffuse colonic stool burden.  Clozapine noted to possibly be cause of her constipation and she is being transitioned off by her outpatient psychiatrist.  On MiraLAX, senna, and Colace. -Increase MiraLAX to 34g twice daily - Continue senna - If no BM, advance to suppository and if unsuccessful give enema   FEN/GI: Renal and Carb consistent  VTE Prophylaxis: Lovenox  Disposition: Admit to F PTS-MedSurg  History of Present Illness:  [name] is a 46 y.o. female presenting with nausea, vomiting, and abdominal pain. She has been having around 10 episodes of emesis per day today.  Of not she switched from depakote to [facility] yesterday night.   She was also recently admitted for a similar presentation one month ago with hypotension, hypoglycemia, N/V,  and constipation. She  was found to be dehydrated with low po intake.   Her caregiver mentions that since discharge she has still had decreased p.o. intake nausea.  She has been having falls and low glucose in the morning.  She has been shivering and drooling.  Caregiver denies urinary retention and mentions that she chronically has issues with bowel movements.  Caregiver also mentions that patient's provider wanted to transition off of clozapine.   In the ED, she was hypotensive to 87/51, initially tachycardic but afebrile. She received 2 boluses of 1L NS and one dose of zofran. Her CBC was wnl, Cr was 2.86 from baseline of 1.6-1.7. Glucose on BMP 218. Lactic acid 4.6.  Normal lipase CT AP showed fluid within nondilated stomach and small bowel no obstruction, large diffuse colonic stool burden.   Review Of Systems: Per HPI with the following additions: Abdominal pain, lightheadedness, dizziness, nausea, vomiting, falling  Pertinent Past Medical History: T2DM, Schizophrenia (Hx tardive dyskinesia), CKD 3 s/p partial left nephrectomy and papillary renal cell carcinoma, HLD  Pertinent Past Surgical History: Partial left nephrectomy   Remainder reviewed in history tab.   Pertinent Social History: Tobacco use: Unknown to caregiver Alcohol use: None Other Substance use: None Lives with caregiver Has virtual guardian  Pertinent Family History: Unknown to caregiver  Remainder reviewed in history tab.   Important Outpatient Medications: Clozapine, Paxil, Ingrezza, Quetiapine, Jardiance, Glipizide, Tradjenta, Uzedy, Glycopyrolate, Atorvastatin ,  Remainder reviewed in medication history.   Objective: BP 91/62   Pulse (!) 114   Temp 98.5 F (36.9 C) (Oral)   Resp 14   Ht '5\' 2"'$  (1.575 m)   Wt 53.1 kg   SpO2 93%   BMI 21.40 kg/m  Exam: General: Uncomfortable appearing, shaking, pleasant Eyes: Able to track, PERRLA  ENT: Dry mucous membranes, drooling from mouth Cardiovascular: Tachycardic, regular rhythm,  faint radial pulses, cap refill 4 seconds. Respiratory: No increased work of breathing, clear to auscultation bilaterally Gastrointestinal: Soft, slightly distended, tender to palpation Neuro/psych: able to converse, slightly disoriented and unorganized conversation, smacking lips and protruding tongue with drooling, able to move all extremities equally.   Labs:  CBC BMET  Recent Labs  Lab 11/15/21 2040  WBC 8.2  HGB 8.3*  HCT 27.4*  PLT 83*   Recent Labs  Lab 11/15/21 1857  NA 143  K 4.5  CL 114*  CO2 19*  BUN 24*  CREATININE 2.86*  GLUCOSE 218*  CALCIUM 8.5*    Pertinent additional labs lactic acid.  4.6, 4.1  Imaging Studies Performed:  CT abdomen pelvis: Fluid within nondilated stomach and small bowel, no obstruction, large diffuse colonic stool burden  Sharion Settler, DO 11/15/2021, 11:59 PM PGY-1, [place] Intern pager: [phone], text pages welcome Secure chat group [place]

## 2021-11-15 NOTE — ED Notes (Signed)
Patient is being discharged from the Urgent Care and sent to the Emergency Department via [facility] . Per Dr. Windy Carina, patient is in need of higher level of care due to hypotension. Patient is aware and verbalizes understanding of plan of care.  Vitals:   11/15/21 1752  BP: (!) 87/51  Pulse: (!) 137  Resp: 18  Temp: 98.2 F (36.8 C)  SpO2: 100%

## 2021-11-15 NOTE — ED Triage Notes (Signed)
BIB [facility] from [facility]. Stopped depakote yesterday, started new med. This AM experienced N/V. Hx schizophrenia, nonverbal. Caretaker/family at bedside. UC vitals: 87/55  Hr 130 CBG 200

## 2021-11-15 NOTE — ED Notes (Signed)
Critical lactic 4.6. Provider made aware.

## 2021-11-15 NOTE — ED Notes (Signed)
Report received from Mickel Baas, [place]. All questions answered. Patient placed in room. Cardiac monitor in place. Patient resting in bed at this time. NAD noted.

## 2021-11-15 NOTE — Assessment & Plan Note (Addendum)
Patient with schizophrenia requiring caregiver.  With history of tardive dyskinesia.  Recently switched to Uzendy from Depakote.  Having shivering, tongue twitches, and drooling. -Continue clozapine, Ingrezza, quetiapine, Paxil

## 2021-11-15 NOTE — ED Provider Notes (Signed)
[facility] EMERGENCY DEPARTMENT Provider Note   CSN: [id number] Arrival date & time: 11/15/21  [name]     History  Chief Complaint  Patient presents with   Emesis   Hypotension   Tachycardia    [name] is a 46 y.o. female.  Patient presents chief complaint of vomiting, tachycardia, low blood pressure.  She had multiple similar episodes in the past.  Caregiver at bedside states that they recently changed her Depakote, stopping that and adding an IM shot instead.  They went to see an urgent care doctor for her vomiting, found to be hypotensive and sent to the ER.  Otherwise no blood noted in the vomitus.  No reports of fevers or cough or diarrhea.  Patient recently admitted for similar episodes of vomiting a few weeks ago.       Home Medications Prior to Admission medications   Medication Sig Start Date End Date Taking? Authorizing Provider  acetaminophen (GOODSENSE PAIN RELIEF EXTRA ST) 500 MG tablet Take 1 tablet (500 mg total) by mouth every 8 (eight) hours as needed for mild pain or fever. 09/20/21   Leeanne Rio, MD  aspirin (GOODSENSE ASPIRIN) 81 MG chewable tablet Chew 1 tablet (81 mg total) by mouth daily. 07/25/21   Leeanne Rio, MD  atorvastatin (LIPITOR) 20 MG tablet TAKE ONE TABLET BY MOUTH at bedtime 11/11/21   Leeanne Rio, MD  Blood Glucose Monitoring Suppl [facility] VERIO) w/Device KIT Check blood sugar once daily 03/27/20   Leeanne Rio, MD  cloZAPine (CLOZARIL) 100 MG tablet Take 4 tablets (400 mg total) by mouth at bedtime for 7 days. 04/01/21   Blanchie Dessert, MD  divalproex (DEPAKOTE ER) 500 MG 24 hr tablet Take 2,000 mg by mouth at bedtime.    [provider]  Emollient (CETAPHIL) cream APPLY DAILY Patient taking differently: 1 application  daily. 09/26/21   Leeanne Rio, MD  famotidine (PEPCID) 20 MG tablet Take 1 tablet (20 mg total) by mouth 2 (two) times daily. 09/05/21   Leeanne Rio, MD  glipiZIDE (GLUCOTROL) 5 MG tablet Take 0.5 tablets (2.5 mg total) by mouth daily before breakfast. 10/31/21   Precious Gilding, DO  glucose blood (ONETOUCH VERIO) test strip CHECK BLOOD GLUCOSE (SUGAR) EVERY DAY 06/10/21   Zenia Resides, MD  glycopyrrolate (ROBINUL) 1 MG tablet Take 1 mg by mouth daily.    [provider]  Incontinence Supply Disposable (INCONTINENCE BRIEF MEDIUM) MISC Wear every night 05/27/19   Leeanne Rio, MD  INGREZZA 80 MG capsule Take 80 mg by mouth at bedtime. 09/25/21   [provider]  JARDIANCE 10 MG TABS tablet TAKE ONE TABLET BY MOUTH ONCE DAILY 09/20/21   Leeanne Rio, MD  Lancet Devices (ONE TOUCH DELICA LANCING DEV) MISC Check blood sugar once daily 03/27/20   Leeanne Rio, MD  linagliptin (TRADJENTA) 5 MG TABS tablet Take 1 tablet (5 mg total) by mouth daily. 10/23/21   Alcus Dad, MD  medroxyPROGESTERone (DEPO-PROVERA) 150 MG/ML injection Inject 150 mg into the muscle every 3 (three) months.    [provider]  Multiple Vitamin (MULTIVITAMIN WITH MINERALS) TABS tablet Take 1 tablet by mouth daily. 10/31/21   Precious Gilding, DO  OneTouch Delica Lancets 40J MISC Check blood sugar once daily 03/27/20   Leeanne Rio, MD  PARoxetine (PAXIL) 10 MG tablet Take 10 mg by mouth daily.    [provider]  polyethylene glycol powder (GLYCOLAX/MIRALAX)  17 GM/SCOOP powder Take 17 g by mouth at bedtime. 07/10/21   Dickie La, MD  QUEtiapine (SEROQUEL) 50 MG tablet Take 50 mg by mouth at bedtime. 10/01/21   [provider]  senna-docusate (SENOKOT-S) 8.6-50 MG tablet Take 1 tablet by mouth at bedtime. 10/23/21   Alcus Dad, MD  simethicone (GAS-X EXTRA STRENGTH) 125 MG chewable tablet Chew 1 tablet (125 mg total) by mouth daily as needed for flatulence. 09/20/21   Leeanne Rio, MD      Allergies    Minocycline    Review of Systems   Review of Systems  Constitutional:  Negative  for fever.  HENT:  Negative for ear pain.   Eyes:  Negative for pain.  Respiratory:  Negative for cough.   Cardiovascular:  Negative for chest pain.  Gastrointestinal:  Positive for abdominal pain.  Genitourinary:  Negative for flank pain.  Musculoskeletal:  Negative for back pain.  Skin:  Negative for rash.  Neurological:  Negative for headaches.    Physical Exam Updated Vital Signs BP 91/62   Pulse (!) 114   Temp 98.5 F (36.9 C) (Oral)   Resp 14   Ht 5' 2"  (1.575 m)   Wt 53.1 kg   SpO2 93%   BMI 21.40 kg/m  Physical Exam Constitutional:      General: She is not in acute distress.    Appearance: Normal appearance.  HENT:     Head: Normocephalic.     Nose: Nose normal.  Eyes:     Extraocular Movements: Extraocular movements intact.  Cardiovascular:     Rate and Rhythm: Normal rate.  Pulmonary:     Effort: Pulmonary effort is normal.  Abdominal:     Tenderness: There is no abdominal tenderness. There is no guarding or rebound.  Musculoskeletal:        General: Normal range of motion.     Cervical back: Normal range of motion.  Neurological:     General: No focal deficit present.     Mental Status: She is alert. Mental status is at baseline.     ED Results / Procedures / Treatments   Labs (all labs ordered are listed, but only abnormal results are displayed) Labs Reviewed  CBC WITH DIFFERENTIAL/PLATELET - Abnormal; Notable for the following components:      Result Value   RBC 3.41 (*)    Hemoglobin 10.9 (*)    MCV 105.9 (*)    RDW 16.0 (*)    All other components within normal limits  COMPREHENSIVE METABOLIC PANEL - Abnormal; Notable for the following components:   Chloride 114 (*)    CO2 19 (*)    Glucose, Bld 218 (*)    BUN 24 (*)    Creatinine, Ser 2.86 (*)    Calcium 8.5 (*)    Total Protein 6.4 (*)    Albumin 2.8 (*)    GFR, Estimated 20 (*)    All other components within normal limits  LACTIC ACID, PLASMA - Abnormal; Notable for the following  components:   Lactic Acid, Venous 4.6 (*)    All other components within normal limits  CULTURE, BLOOD (ROUTINE X 2)  CULTURE, BLOOD (ROUTINE X 2)  URINE CULTURE  LIPASE, BLOOD  LACTIC ACID, PLASMA  URINALYSIS, ROUTINE W REFLEX MICROSCOPIC  PREGNANCY, URINE  CBC WITH DIFFERENTIAL/PLATELET    EKG None  Radiology No results found.  Procedures .Critical Care  Performed by: Luna Fuse, MD Authorized by: Luna Fuse,  MD   Critical care provider statement:    Critical care time (minutes):  30   Critical care time was exclusive of:  Separately billable procedures and treating other patients and teaching time   Critical care was necessary to treat or prevent imminent or life-threatening deterioration of the following conditions:  Circulatory failure and shock     Medications Ordered in ED Medications  piperacillin-tazobactam (ZOSYN) IVPB 3.375 g (has no administration in time range)  sodium chloride 0.9 % bolus 1,000 mL (1,000 mLs Intravenous New Bag/Given 11/15/21 1931)  ondansetron (ZOFRAN) injection 4 mg (4 mg Intravenous Given 11/15/21 1930)  sodium chloride 0.9 % bolus 1,000 mL (1,000 mLs Intravenous New Bag/Given 11/15/21 2021)    ED Course/ Medical Decision Making/ A&P                           Medical Decision Making Amount and/or Complexity of Data Reviewed Labs: ordered. Radiology: ordered.  Risk Prescription drug management.   History from caregiver at bedside.  Review of record shows discharge summary October 30, 2021.  Cardiac monitoring showing sinus tachycardia.  Work-up includes labs White count of 4 hemoglobin 10 chemistry showing AKI creatinine 2.8 baseline around 2.1.  Bicarb of 19.  Patient given 2 L bolus of fluids.  Lactic acid is 4.6.  However she is afebrile has no leukocytosis.  Blood pressure however is hypotensive in the 71I and 96V systolic, responding well to fluid resuscitation.  Empirically started on antibiotics, admitted to the  hospitalist team.        Final Clinical Impression(s) / ED Diagnoses Final diagnoses:  Vomiting, unspecified vomiting type, unspecified whether nausea present  Hypotensive episode    Rx / DC Orders ED Discharge Orders     None         Luna Fuse, MD 11/15/21 2056

## 2021-11-15 NOTE — H&P (Cosign Needed)
Hospital Admission History and Physical Service Pager: [phone]  Patient name: [name] Medical record number: [id number] Date of Birth: August 20, 1975 Age: 46 y.o. Gender: female  Primary Care Provider: Leeanne Rio, MD Consultants:N/A Code Status: Full code, will need to be confirmed with guardian (unable to reach at this time)  Preferred Emergency Contact:  Guardian: Delaney [phone] On-call guardian after-hours phone number [phone])   Chief Complaint: Nausea, vomiting  Assessment and Plan: [name] is a 46 y.o. female presenting with nausea and vomiting.  Patient with hypotension, tachycardia, elevated lactic acid.  Differential for this patient's presentation of this includes euglycemic DKA, hypoperfusion secondary to decreased p.o. intake, anticholinergic side effects of psychiatry medications.  Most likely due to decreased p.o. intake and dehydration, however euglycemic DKA possible contributing factor as patient is on SGLT-2, has been having hypoglycemic episodes and recently started new Psychiatric medication.  * Hypotension Patient hypotensive to 80s over 50s with 1 day of recurrent emesis.  Possibly secondary to dehydration, initiation of new medicine, or euglycemic DKA. Received 2L bolus in the ED and systolic's now 83M.  - VBG, urine ketones - Trend lactic acid - Continue maintenance fluids -Hold glycopyrrolate, hold Jardiance -Orthostatic vitals  Acute lactic acidosis Lactic acid 4.6 on arrival, has improved to 4.1 after 2L bolus. Patient met SIRS criteria in the ED with hypotension and tachycardia. Blood cultures collected and she received a dose of Zosyn. Doubt sepsis at this time but will follow cultures. LA likley in the setting of dehydration secondary to emesis and decreased p.o. intake.  Possibly due to euglycemic DKA. CT Abd/pelv without acute findings. Abdominal exam not consistent with mesenteric ischemia.  - Trend lactic acid  every 2 hours - Maintenance fluids - VBG, urine ketones - Hold Jardiance, Glipizide  - CBGs q. ACHS -Follow up blood cx  -No further abx at this time  AKI (acute kidney injury) ([place]) Non-oliguric AKI on CKD stage 3b. Most likely pre-renal in the setting of decreased p.o. intake and dehydration, possibly worsened with euglycemic DKA.  She did have problems with urinary retention last admission so will need to monitor for post-renal causes as well. Baseline creatinine 1.6-7, now  2.86. 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  -Receiving maintenance fluids - Avoiding nephrotoxic medications -Daily BMP -Keep MAP >65 -Bladder scans q8h  Type 2 diabetes mellitus with renal manifestations (HCC) A1c in April 7.3. On Jardiance, Trajenta and glipizide. Fasting blood sugars reviewed and range 72-130 though with history of hypoglycemia in the past (also on most recent admission). Currently with lactic acidosis. - Hold Jardiance, Tradjenta, and glipizide -CBG AC and QHS -sSSI without HS coverage; adjust as needed -Hemoglobin A1c   Schizophrenia ([place]) Patient with schizophrenia requiring caregiver.  With history of tardive dyskinesia.  Recently switched to Uzendy from Depakote.  Having shivering, tongue twitches, and drooling. -Continue clozapine, Ingrezza, quetiapine, Paxil  CONSTIPATION, CHRONIC Patient with history of chronic constipation.  CTAP shows large diffuse colonic stool burden.  Clozapine noted to possibly be cause of her constipation and she is being transitioned off by her outpatient psychiatrist.  On MiraLAX, senna, and Colace. -Increase MiraLAX to 34g twice daily - Continue senna - If no BM, advance to suppository and if unsuccessful give enema  Tachycardia HR 110s-130s. Likely 2/2 hypovolemia.  -EKG -Continue tele  -Continue IVF   Macrocytic anemia Hgb 10.9, MCV 105.9.  -B12  -Folate  -AM CBC   Thrombocytopenia (  [place]) Chronic problem.  Platelets 83k on admission, baseline around 130k.  Hold anticoagulation if platelets fall below 50,000.   FEN/GI: Renal and Carb modified  VTE Prophylaxis: Lovenox  Disposition: Admit to F PTS-MedSurg  History of Present Illness:  [name] is a 46 y.o. female presenting with nausea, vomiting, and abdominal pain. She has been having around 10 episodes of emesis per day today.  Of not she switched from depakote to [facility] yesterday night.   She was also recently admitted for a similar presentation one month ago with hypotension, hypoglycemia, N/V, and constipation. She was found to be dehydrated with low po intake.   Her caregiver mentions that since discharge she has still had decreased p.o. intake nausea.  She has been having falls and low glucose in the morning.  She has been shivering and drooling.  Caregiver denies urinary retention and mentions that she chronically has issues with bowel movements.  Caregiver also mentions that patient's provider wanted to transition off of clozapine.   In the ED, she was hypotensive to 87/51, initially tachycardic but afebrile. She received 2 boluses of 1L NS and one dose of zofran. Her CBC was wnl, Cr was 2.86 from baseline of 1.6-1.7. Glucose on BMP 218. Lactic acid 4.6.  Normal lipase CT AP showed fluid within nondilated stomach and small bowel no obstruction, large diffuse colonic stool burden.   Review Of Systems: Per HPI with the following additions: Abdominal pain, lightheadedness, dizziness, nausea, vomiting, falling  Pertinent Past Medical History: T2DM, Schizophrenia (Hx tardive dyskinesia), CKD 3 s/p partial left nephrectomy and papillary renal cell carcinoma, HLD  Pertinent Past Surgical History: Partial left nephrectomy   Remainder reviewed in history tab.   Pertinent Social History: Tobacco use: Unknown to caregiver Alcohol use: None Other Substance use: None Lives with caregiver Has virtual guardian  Pertinent Family  History: Unknown to caregiver  Remainder reviewed in history tab.   Important Outpatient Medications: Clozapine, Paxil, Ingrezza, Quetiapine, Jardiance, Glipizide, Tradjenta, Uzedy, Glycopyrolate, Atorvastatin ,  Remainder reviewed in medication history.   Objective: BP 92/62   Pulse (!) 127   Temp 98.5 F (36.9 C) (Oral)   Resp 16   Ht 5' 2"  (1.575 m)   Wt 53.1 kg   SpO2 98%   BMI 21.40 kg/m  Exam: General: Uncomfortable appearing, shaking, pleasant Eyes: Able to track, PERRLA  ENT: Dry mucous membranes, drooling from mouth Cardiovascular: Tachycardic, regular rhythm, faint radial pulses, cap refill 4 seconds. Respiratory: No increased work of breathing, clear to auscultation bilaterally Gastrointestinal: Soft, slightly distended, tender to palpation diffusely  Neuro/psych: able to converse, slightly disoriented and unorganized conversation, smacking lips and protruding tongue with drooling, able to move all extremities equally.  Labs:  CBC BMET  Recent Labs  Lab 11/15/21 2040  WBC 8.2  HGB 8.3*  HCT 27.4*  PLT 83*   Recent Labs  Lab 11/15/21 1857  NA 143  K 4.5  CL 114*  CO2 19*  BUN 24*  CREATININE 2.86*  GLUCOSE 218*  CALCIUM 8.5*    Pertinent additional labs lactic acid.  4.6, 4.1  Imaging Studies Performed:  CT abdomen pelvis: Fluid within nondilated stomach and small bowel, no obstruction, large diffuse colonic stool burden  Lowry Ram, MD  11/15/2021, 11:15 PM PGY-1, [place] Intern pager: [phone], text pages welcome Secure chat group [place] Upper-Level Resident Addendum   I have independently interviewed and examined the patient.  I have discussed the above with the original author and agree with their documentation. My edits for correction/addition/clarification are included where appropriate. Please see also any attending notes.   Sharion Settler, DO PGY-3, [facility] 11/16/2021 12:35 AM  FPTS Service pager: [phone] (text pages welcome through [facility])

## 2021-11-15 NOTE — Assessment & Plan Note (Addendum)
A1c in April 7.3. On Jardiance, Trajenta and glipizide. Fasting blood sugars reviewed and range 72-130 though with history of hypoglycemia in the past (also on most recent admission). Currently with lactic acidosis. - Hold Jardiance, Tradjenta, and glipizide -CBG AC and QHS -mSSI

## 2021-11-15 NOTE — ED Triage Notes (Signed)
Per caretaker pt was given Uzeda '75mg'$  injection yesterday and they d/c'ed her Depakote. States vomiting since this am and her glucose is >200. Provider at bedside, abnormal vitals.

## 2021-11-15 NOTE — ED Notes (Signed)
Patient has guardian, Delaney Meigs, she is her case worker, number is [phone] and 24 hour emergency number for case worker is [phone]. Patient lives 24/7 with caregiver, her name is [name], number is [phone].
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
## 2021-11-15 NOTE — Assessment & Plan Note (Addendum)
Non-oliguric AKI on CKD stage 3b. Most likely pre-renal in the setting of decreased p.o. intake and dehydration, possibly worsened with euglycemic DKA.  PO intake much improved today. Awaiting bladder scans to ensure no urinary retention (prior problem during past admission) to monitor for post-renal causes. Baseline Cr 1.6-1.7, still elevated to 2.43 this morning.  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  - LR at 150 ml/hr - Avoiding nephrotoxic medications - Daily BMP - Maintain MAP >65 - Bladder scans q8h

## 2021-11-15 NOTE — Assessment & Plan Note (Addendum)
Patient with history of chronic constipation.  CTAP shows large diffuse colonic stool burden.  Clozapine noted to possibly be cause of her constipation and she is being transitioned off by her outpatient psychiatrist.  On MiraLAX, senna, and Colace. -Increase MiraLAX to 34g twice daily - Continue senna - If no BM, advance to suppository and if unsuccessful give enema

## 2021-11-15 NOTE — Discharge Instructions (Addendum)
Pt is sent to the ER for urgent evaluation

## 2021-11-15 NOTE — Progress Notes (Signed)
PHARMACIST - PHYSICIAN ORDER COMMUNICATION  [name] is a 46 y.o. year old female with a history of schizophrenia on Clozapine PTA. Continuing this medication order as an inpatient requires that monitoring parameters per REMS requirements must be met.   Clozapine REMS Dispense Authorization was obtained, and will dispense inpatient.  RDA code [id number].  Verified Clozapine dose: 449m qHS  Last ANC value and date reported on the Clozapine REMS website: 3300, 7/7 [place]Beaver [place]monitoring frequency: Monthly Next ANC reporting is due on (date) 8/23 JBertis Ruddy PharmD Clinical Pharmacist ED Pharmacist Phone # [phone]/11/2021 11:06 PM

## 2021-11-15 NOTE — Assessment & Plan Note (Addendum)
Patient hypotensive to 80s over 50s with 1 day of recurrent emesis.  Possibly secondary to dehydration, initiation of new medicine, or euglycemic DKA. Received 2L bolus in the ED and systolic's now 75Z.  - VBG, urine ketones - Trend lactic acid - Continue maintenance fluids -Hold glycopyrrolate, hold Jardiance -Orthostatic vitals

## 2021-11-16 DIAGNOSIS — G2401 Drug induced subacute dyskinesia: Secondary | ICD-10-CM | POA: Diagnosis not present

## 2021-11-16 DIAGNOSIS — Z7984 Long term (current) use of oral hypoglycemic drugs: Secondary | ICD-10-CM | POA: Diagnosis not present

## 2021-11-16 DIAGNOSIS — E872 Acidosis, unspecified: Secondary | ICD-10-CM | POA: Diagnosis present

## 2021-11-16 DIAGNOSIS — E870 Hyperosmolality and hypernatremia: Secondary | ICD-10-CM

## 2021-11-16 DIAGNOSIS — E785 Hyperlipidemia, unspecified: Secondary | ICD-10-CM | POA: Diagnosis present

## 2021-11-16 DIAGNOSIS — Z888 Allergy status to other drugs, medicaments and biological substances status: Secondary | ICD-10-CM | POA: Diagnosis not present

## 2021-11-16 DIAGNOSIS — D696 Thrombocytopenia, unspecified: Secondary | ICD-10-CM

## 2021-11-16 DIAGNOSIS — F209 Schizophrenia, unspecified: Secondary | ICD-10-CM | POA: Diagnosis not present

## 2021-11-16 DIAGNOSIS — N1832 Chronic kidney disease, stage 3b: Secondary | ICD-10-CM | POA: Diagnosis present

## 2021-11-16 DIAGNOSIS — R000 Tachycardia, unspecified: Secondary | ICD-10-CM | POA: Insufficient documentation

## 2021-11-16 DIAGNOSIS — N179 Acute kidney failure, unspecified: Secondary | ICD-10-CM | POA: Diagnosis present

## 2021-11-16 DIAGNOSIS — D539 Nutritional anemia, unspecified: Secondary | ICD-10-CM

## 2021-11-16 DIAGNOSIS — I9589 Other hypotension: Secondary | ICD-10-CM

## 2021-11-16 DIAGNOSIS — E860 Dehydration: Secondary | ICD-10-CM | POA: Diagnosis present

## 2021-11-16 DIAGNOSIS — Z85528 Personal history of other malignant neoplasm of kidney: Secondary | ICD-10-CM | POA: Diagnosis not present

## 2021-11-16 DIAGNOSIS — E861 Hypovolemia: Secondary | ICD-10-CM | POA: Diagnosis not present

## 2021-11-16 DIAGNOSIS — Z79899 Other long term (current) drug therapy: Secondary | ICD-10-CM | POA: Diagnosis not present

## 2021-11-16 DIAGNOSIS — K5909 Other constipation: Secondary | ICD-10-CM | POA: Diagnosis present

## 2021-11-16 DIAGNOSIS — E1122 Type 2 diabetes mellitus with diabetic chronic kidney disease: Secondary | ICD-10-CM | POA: Diagnosis present

## 2021-11-16 LAB — I-STAT VENOUS BLOOD GAS, ED
Acid-base deficit: 7 mmol/L — ABNORMAL HIGH (ref 0.0–2.0)
Bicarbonate: 18.5 mmol/L — ABNORMAL LOW (ref 20.0–28.0)
Calcium, Ion: 1.04 mmol/L — ABNORMAL LOW (ref 1.15–1.40)
HCT: 28 % — ABNORMAL LOW (ref 36.0–46.0)
Hemoglobin: 9.5 g/dL — ABNORMAL LOW (ref 12.0–15.0)
O2 Saturation: 99 %
Potassium: 4.6 mmol/L (ref 3.5–5.1)
Sodium: 150 mmol/L — ABNORMAL HIGH (ref 135–145)
TCO2: 20 mmol/L — ABNORMAL LOW (ref 22–32)
pCO2, Ven: 34.5 mmHg — ABNORMAL LOW (ref 44–60)
pH, Ven: 7.337 (ref 7.25–7.43)
pO2, Ven: 151 mmHg — ABNORMAL HIGH (ref 32–45)

## 2021-11-16 LAB — CBC
HCT: 26.9 % — ABNORMAL LOW (ref 36.0–46.0)
Hemoglobin: 8.5 g/dL — ABNORMAL LOW (ref 12.0–15.0)
MCH: 32.8 pg (ref 26.0–34.0)
MCHC: 31.6 g/dL (ref 30.0–36.0)
MCV: 103.9 fL — ABNORMAL HIGH (ref 80.0–100.0)
Platelets: 83 10*3/uL — ABNORMAL LOW (ref 150–400)
RBC: 2.59 MIL/uL — ABNORMAL LOW (ref 3.87–5.11)
RDW: 16.2 % — ABNORMAL HIGH (ref 11.5–15.5)
WBC: 10 10*3/uL (ref 4.0–10.5)
nRBC: 0 % (ref 0.0–0.2)

## 2021-11-16 LAB — GLUCOSE, CAPILLARY
Glucose-Capillary: 105 mg/dL — ABNORMAL HIGH (ref 70–99)
Glucose-Capillary: 110 mg/dL — ABNORMAL HIGH (ref 70–99)
Glucose-Capillary: 117 mg/dL — ABNORMAL HIGH (ref 70–99)
Glucose-Capillary: 145 mg/dL — ABNORMAL HIGH (ref 70–99)

## 2021-11-16 LAB — COMPREHENSIVE METABOLIC PANEL
ALT: 14 U/L (ref 0–44)
AST: 20 U/L (ref 15–41)
Albumin: 2 g/dL — ABNORMAL LOW (ref 3.5–5.0)
Alkaline Phosphatase: 47 U/L (ref 38–126)
Anion gap: 8 (ref 5–15)
BUN: 25 mg/dL — ABNORMAL HIGH (ref 6–20)
CO2: 18 mmol/L — ABNORMAL LOW (ref 22–32)
Calcium: 7.6 mg/dL — ABNORMAL LOW (ref 8.9–10.3)
Chloride: 123 mmol/L — ABNORMAL HIGH (ref 98–111)
Creatinine, Ser: 2.43 mg/dL — ABNORMAL HIGH (ref 0.44–1.00)
GFR, Estimated: 24 mL/min — ABNORMAL LOW (ref >60)
Glucose, Bld: 100 mg/dL — ABNORMAL HIGH (ref 70–99)
Potassium: 4.8 mmol/L (ref 3.5–5.1)
Sodium: 149 mmol/L — ABNORMAL HIGH (ref 135–145)
Total Bilirubin: 0.5 mg/dL (ref 0.3–1.2)
Total Protein: 4.8 g/dL — ABNORMAL LOW (ref 6.5–8.1)

## 2021-11-16 LAB — FOLATE: Folate: 12.6 ng/mL (ref >5.9)

## 2021-11-16 LAB — HEMOGLOBIN A1C
Hgb A1c MFr Bld: 6.3 % — ABNORMAL HIGH (ref 4.8–5.6)
Mean Plasma Glucose: 134.11 mg/dL

## 2021-11-16 LAB — TSH: TSH: 1.049 u[IU]/mL (ref 0.350–4.500)

## 2021-11-16 LAB — KETONES, URINE: Ketones, ur: NEGATIVE mg/dL

## 2021-11-16 LAB — LACTIC ACID, PLASMA
Lactic Acid, Venous: 2.8 mmol/L (ref 0.5–1.9)
Lactic Acid, Venous: 2.3 mmol/L (ref 0.5–1.9)
Lactic Acid, Venous: 1.9 mmol/L (ref 0.5–1.9)

## 2021-11-16 LAB — VITAMIN B12: Vitamin B-12: 896 pg/mL (ref 180–914)

## 2021-11-16 LAB — CBG MONITORING, ED: Glucose-Capillary: 128 mg/dL — ABNORMAL HIGH (ref 70–99)

## 2021-11-16 MED ORDER — SODIUM CHLORIDE 0.9 % IV SOLN
INTRAVENOUS | Status: DC
Start: 2021-11-16 — End: 2021-11-16

## 2021-11-16 MED ORDER — INSULIN ASPART 100 UNIT/ML IJ SOLN: 0.0000 [IU] | Freq: Three times a day (TID) | INTRAMUSCULAR | Status: DC

## 2021-11-16 MED ORDER — LACTATED RINGERS IV SOLN
INTRAVENOUS | Status: AC
Start: 2021-11-16 — End: 2021-11-16

## 2021-11-16 MED ORDER — FAMOTIDINE 20 MG PO TABS
20.0000 mg | ORAL_TABLET | Freq: Every day | ORAL | Status: DC
  Filled 2021-11-16: qty 1

## 2021-11-16 MED ORDER — SODIUM CHLORIDE 0.9 % IV SOLN
12.5000 mg | Freq: Four times a day (QID) | INTRAVENOUS | Status: AC
  Administered 2021-11-16 (×3): 12.5 mg via INTRAVENOUS
  Filled 2021-11-16 (×2): qty 0.5
  Filled 2021-11-16 (×2): qty 12.5
  Filled 2021-11-16 (×3): qty 0.5

## 2021-11-16 MED ORDER — CLOZAPINE 100 MG PO TABS
300.0000 mg | ORAL_TABLET | Freq: Every day | ORAL | Status: DC
  Administered 2021-11-16: 300 mg via ORAL
  Filled 2021-11-16 (×2): qty 3

## 2021-11-16 NOTE — Assessment & Plan Note (Signed)
Acute on chronic, Na 149. - Monitor w/ labs - Switch IVF from NaCl to LR @ 150 ml/hr

## 2021-11-16 NOTE — Progress Notes (Signed)
FMTS Brief Progress Note  S:Patient says she feels unwell and is nauseous with abdominal pain.    O: BP (!) 71/61   Pulse (!) 131   Temp 98.5 F (36.9 C) (Oral)   Resp (!) 23   Ht '5\' 2"'$  (1.575 m)   Wt 53.1 kg   SpO2 98%   BMI 21.40 kg/m    Physical Exam  General - non toxic appearing, actively vomiting but able to sit up and converse.  CV - tachycardic, looks pale  Resp - Normal work of breathing  Psyc - Patient conversant and pleasant, but previously paranoid and refusing medications and EKG as she believed we were making her sick by putting toxic medication in the IV.   A/P: Hemodynamic instability  Patient is actively vomiting. Nurse was having difficulty with obtaining accurate vitals due to patient misunderstanding (patient paranoia). Patient appears stable with no acute changes on EKG.  - Increase maintenance to 150 mL/hr  - Ordered phenergan q6hrs  - Change to progressive    Lowry Ram, MD 11/16/2021, 1:58 AM PGY-1, [facility] Night Resident  Please page [phone] with questions.

## 2021-11-16 NOTE — Care Management Obs Status (Signed)
[facility] NOTIFICATION   Patient Details  Name: [name] MRN: [id number] Date of Birth: Mar 16, 1976   Medicare Observation Status Notification Given:  Yes    Avinash Maltos G., RN 11/16/2021, 10:22 AM

## 2021-11-16 NOTE — Assessment & Plan Note (Deleted)
HR 110s-140s. Likely 2/2 hypovolemia. EKG shows no acute changes, sinus tachycardia with diffuse low voltage, right bundle branch block  -Continue tele  -Continue IVF

## 2021-11-16 NOTE — Assessment & Plan Note (Addendum)
Hgb drop to 8.5 from 10.9 on admission. No signs or symptoms of bleeding. Likely 2/2 hemodilution from IVF. B12 and folate wnl. - Monitor for sx's/signs of bleeding - AM CBC

## 2021-11-16 NOTE — ED Notes (Addendum)
After getting patient to do orthostatic vital signs, patient got back in bed and became sick on her stomach. She vomitted multiple times, Dr. Gwendolyn [name] came to bedside to evaluate patient, MD ordered phenergan. Medication given.

## 2021-11-16 NOTE — Hospital Course (Addendum)
[name] is a 46 year old female with past medical history significant for type 2 diabetes, schizophrenia with history of tardive dyskinesia, CKD 3 as s/p partial left nephrectomy and papillary renal cell carcinoma, HLD, cerebral atrophy who presented with nausea and vomiting and found to be in lactic acidosis.  Hospital course outlined with the problems below.   Hypotension Patient was hypotensive to 80s over 50s with 1 day of recurrent emesis.  Hypotension secondary to dehydration secondary to emesis. Patient received 2 boluses of 1 L normal saline in ED and was resuscitated with 1.5 maintenance fluids.  Glipizide, Jardiance, and linagliptin all held on admission.  Patient's blood pressure normalized with vigorous p.o. intake of fluids and solids.  Patient appears to be asymptomatic, on discharge had normal BP.  Lactic acidosis Patient had lactic acid of 4.6 on arrival which improved to 4.1 after 2 L bolus.  Patient met SIRS criteria in ED.  Blood cultures were collected and she received a dose of Zosyn.  Blood cultures showed no growth in 2 days.  Urine cultures nonsignificant, likely dirty catch.  CT abdomen pelvis without acute findings.  Urine ketones negative.  Lactic acidosis resolved with improved p.o. intake.  AKI Nonoliguric AKI on CKD stage IIIb.  Most likely due to hypoperfusion secondary to decreased p.o. intake and emesis.  Creatinine on arrival was 2.86 from baseline of 1.6.  Patient was treated with 1.5 maintenance fluids.  Patient declined bladder scans, was reported to urinate on her own.  Creatinine trended down to 2.3 on day of discharge.  Recommend PCP to trend labs normal.  Type 2 diabetes A1c 6.3 on arrival.  Fasting blood sugars reviewed with caregiver showed range of 72-1 30 although she mentioned hypoglycemic episodes in the home.  Jardiance, Tradjenta, glipizide were held during admission and patient was treated with SSI and adjusted as needed.  Upon discharge, patient  was recommended to only restart Jardiance.  Recommend PCP instruct restart of Tradjenta and glipizide as appropriate  Schizophrenia Patient recently switched to Uzendy from Depakote.  Medications which possibly causing emesis.  Has shivering tongue twitches and drooling.  Patient has history of tar dive dyskinesia.  Patient was continued on clozapine Ingrezza quetiapine and Paxil during admission.  Inpatient psychiatry consulted, recommended to discontinue Seroquel as that may worsen dizziness and other symptoms when used concurrently with her other medications.  Psychiatry also recommended to decrease Ingrezza from 80 mg to 60 mg if her tachycardia did not improve in 48 hours.  Constipation Patient has history of chronic constipation most likely secondary to longtime use of clozapine.  Patient is being transitioned off of clozapine by her outpatient psychiatrist.  CTAP showed large diffuse colonic stool burden.  Patient was continued on senna, MiraLAX increased to 35 g twice daily, and was given enema.  PCP to follow-up.

## 2021-11-16 NOTE — ED Notes (Signed)
Patient refused to take her medication and do orthostatic vitals at this time. She said it makes her shakey and she don't want to feel it anymore.

## 2021-11-16 NOTE — Progress Notes (Signed)
Tried multiple times to give the medications. Patient refused. Patient said " these pills make me weak". Education given. Will follow up. Informed Nancy Fetter, MD during night rounds.

## 2021-11-16 NOTE — Progress Notes (Signed)
Daily Progress Note Intern Pager: [phone]  Patient name: [name] Medical record number: [id number] Date of birth: 05/18/1975 Age: 46 y.o. Gender: female  Primary Care Provider: Leeanne Rio, MD Consultants: None Code Status: Full code (will need to be confirmed with guardian)  Pt Overview and Major Events to Date:  7/7: Admitted  Assessment and Plan: [name] is a 46 year-old female who presented with nausea and vomiting and hypotension/tachycardia/lactic acidosis likely due to new medication on Ingrezza, viral gastroenteritis, euglycemic DKA. Pertinent PMH/PSH includes seizure disorder, T2DM, schizophrenia, macrocytic anemia, thrombocytopenia.   * Hypotension due to hypovolemia (emesis)  Hypotension improving w/ IVF.  Possibly secondary to dehydration, initiation of new medicine. Received 2L bolus in the ED  -- LR at 150 ml/hr - Phenergan q6hrs for vomiting  -Hold glycopyrrolate, hold Jardiance - Orthostatic vitals  Acute lactic acidosis Lactic acid 4.6 > 2.8. Still tachycardic with improved hypotension. Blood cultures collected and she received a dose of Zosyn. LA likley in the setting of dehydration secondary to emesis and decreased p.o. intake. Unlikely euglycemic DKA given Negative urine ketones. - Trend lactic acid every 2 hours, until normalized -- Pending blood cultures - Maintenance fluids - Hold Jardiance, Glipizide  - CBGs q. ACHS -- Follow up blood cx  -- No further abx at this time  AKI (acute kidney injury) ([place]) Non-oliguric AKI on CKD stage 3b. Most likely pre-renal in the setting of decreased p.o. intake and dehydration, possibly worsened with euglycemic DKA.  PO intake much improved today. Awaiting bladder scans to ensure no urinary retention (prior problem during past admission) to monitor for post-renal causes. Baseline Cr 1.6-1.7, still elevated to 2.43 this morning.  History of partial left nephrectomy and papillary renal cell  carcinoma.  - LR at 150 ml/hr - Avoiding nephrotoxic medications - Daily BMP - Maintain MAP >65 - Bladder scans q8h  Type 2 diabetes mellitus with renal manifestations (HCC) A1c 6.3%. On Jardiance, Trajenta and glipizide. Fasting blood sugars reviewed and range 72-130 though with history of hypoglycemia in the past (also on most recent admission). Currently with lactic acidosis. - Hold Jardiance, Tradjenta, and glipizide -CBG AC and QHS -sSSI without HS coverage; adjust as needed   Schizophrenia [facility]) Patient with schizophrenia requiring caregiver.  With history of tardive dyskinesia.  Recently switched to Uzendy from Depakote.  Having shivering, tongue twitches, and drooling. -Continue clozapine, Ingrezza, quetiapine, Paxil  CONSTIPATION, CHRONIC CTAP shows large diffuse colonic stool burden. Had large BM this morning per RN. Still with some mild abdominal pain. - Continue MiraLAX to 34g twice daily - Continue senna  Hypernatremia Acute on chronic, Na 149. - Monitor w/ labs - Switch IVF from NaCl to LR @ 150 ml/hr  Macrocytic anemia Hgb drop to 8.5 from 10.9 on admission. No signs or symptoms of bleeding. Likely 2/2 hemodilution from IVF. B12 and folate wnl. - Monitor for sx's/signs of bleeding - AM CBC  Thrombocytopenia (HCC) Chronic problem. Platelets 83k on admission, baseline around 130k.  Hold anticoagulation if platelets fall below 50,000.     FEN/GI: Carb modified PPx: Heparin 5000u subq q8 h Dispo:Pending PT recommendations  pending clinical improvement . Barriers include clinical improvement.   Subjective:  Reports feeling better this morning. Refused AM meds. Has some abdominal pain but drinking lots of cranberry juice. Not feeling like eating.   Objective: Temp:  [98.2 F (36.8 C)-99.2 F (37.3 C)] 98.9 F (37.2 C) (07/08 0746) Pulse Rate:  [103-137] 117 (07/08  2330) Resp:  [12-52] 19 (07/08 0746) BP: (71-110)/(39-93) 103/69 (07/08 0746) SpO2:  [93  %-100 %] 100 % (07/08 0746) Weight:  [53.1 kg] 53.1 kg (07/07 1836) Physical Exam: General: Chronically-ill appearing, in no distress, laying on left side in bed shaking Cardiovascular: RRR, no murmurs Respiratory: Regular WOB on room air, lungs clear but diminished lung sounds Abdomen: Soft, mild TTP diffusely, non-distended Extremities: Warm, thin, well-perfused  Laboratory: Most recent CBC Lab Results  Component Value Date   WBC 10.0 11/16/2021   HGB 8.5 (L) 11/16/2021   HCT 26.9 (L) 11/16/2021   MCV 103.9 (H) 11/16/2021   PLT 83 (L) 11/16/2021   Most recent BMP    Latest Ref Rng & Units 11/16/2021    6:02 AM  BMP  Glucose 70 - 99 mg/dL 100   BUN 6 - 20 mg/dL 25   Creatinine 0.44 - 1.00 mg/dL 2.43   Sodium 135 - 145 mmol/L 149   Potassium 3.5 - 5.1 mmol/L 4.8   Chloride 98 - 111 mmol/L 123   CO2 22 - 32 mmol/L 18   Calcium 8.9 - 10.3 mg/dL 7.6       Imaging/Diagnostic Tests: Radiologist Impression: CT Abdomen Pelvis Wo Contrast  Result Date: 11/15/2021 CLINICAL DATA:  Abdominal pain with nausea and vomiting. EXAM: CT ABDOMEN AND PELVIS WITHOUT CONTRAST TECHNIQUE: Multidetector CT imaging of the abdomen and pelvis was performed following the standard protocol without IV contrast. RADIATION DOSE REDUCTION: This exam was performed according to the departmental dose-optimization program which includes automated exposure control, adjustment of the mA and/or kV according to patient size and/or use of iterative reconstruction technique. COMPARISON:  Most recent abdominopelvic CT 10/20/2021 FINDINGS: Lower chest: No acute airspace disease or pleural effusion. Patulous distal esophagus with wall thickening at the gastroesophageal junction. Hepatobiliary: Motion limited evaluation. Allowing for this, no focal hepatic abnormality. The gallbladder is physiologically distended. No calcified gallstone. Motion limits assessment for wall thickening. Pancreas: No ductal dilatation or  inflammation. Spleen: Small in size without focal abnormality. Adrenals/Urinary Tract: Normal adrenal glands. Renal cortical scarring in the lower left kidney. No hydronephrosis. No urolithiasis. Partially distended urinary bladder, no wall thickening. Stomach/Bowel: Patulous distal esophagus with wall thickening at the gastroesophageal junction and small hiatal hernia. Fluid within the stomach. No obvious gastric wall thickening. Mild fluid-filled small bowel in the right abdomen. No abnormal distension or obstruction. Normal appendix. Large diffuse colonic stool burden. Sigmoid colonic redundancy. No abnormal rectal distention. Vascular/Lymphatic: Normal caliber abdominal aorta. No portal venous or mesenteric gas. No bulky abdominopelvic adenopathy. Reproductive: 2.1 cm follicle in the right ovary, needs no further follow-up. Unremarkable appearance of the uterus and left ovary. No adnexal mass. Other: No free air, free fluid, or intra-abdominal fluid collection. Minor body wall edema. Musculoskeletal: Stable sclerotic focus within L2 vertebral body. Disc bulge at L4-L5 eccentric to the left. There are no acute or suspicious osseous abnormalities. IMPRESSION: 1. Patulous distal esophagus with wall thickening at the gastroesophageal junction and small hiatal hernia, suggesting reflux or esophagitis. 2. Fluid within nondilated stomach and small bowel, can be seen with enteritis in the appropriate clinical setting. No obstruction. 3. Large diffuse colonic stool burden, suggestive of constipation. Electronically Signed   By: Keith Rake M.D.   On: 11/15/2021 21:50    Orvis Brill, DO 11/16/2021, 10:24 AM  PGY-2, [facility] Intern pager: [phone], text pages welcome Secure chat group [place]

## 2021-11-16 NOTE — ED Notes (Signed)
Dr. Gwendolyn [name] aware patient refused medications.

## 2021-11-16 NOTE — ED Notes (Signed)
Report given to Apolonio Schneiders, Therapist, sports. All questions answered. Patient transferred to inpatient room on monitor, during transport BP was 99/64, Heart rate was 128.

## 2021-11-16 NOTE — Assessment & Plan Note (Addendum)
Chronic problem. Platelets 83k on admission, baseline around 130k.  Hold anticoagulation if platelets fall below 50,000.

## 2021-11-17 DIAGNOSIS — I9589 Other hypotension: Secondary | ICD-10-CM | POA: Diagnosis not present

## 2021-11-17 DIAGNOSIS — E861 Hypovolemia: Secondary | ICD-10-CM | POA: Diagnosis not present

## 2021-11-17 DIAGNOSIS — G2401 Drug induced subacute dyskinesia: Secondary | ICD-10-CM

## 2021-11-17 DIAGNOSIS — F209 Schizophrenia, unspecified: Secondary | ICD-10-CM

## 2021-11-17 LAB — COMPREHENSIVE METABOLIC PANEL
ALT: 13 U/L (ref 0–44)
AST: 13 U/L — ABNORMAL LOW (ref 15–41)
Albumin: 2 g/dL — ABNORMAL LOW (ref 3.5–5.0)
Alkaline Phosphatase: 58 U/L (ref 38–126)
Anion gap: 6 (ref 5–15)
BUN: 16 mg/dL (ref 6–20)
CO2: 22 mmol/L (ref 22–32)
Calcium: 8.5 mg/dL — ABNORMAL LOW (ref 8.9–10.3)
Chloride: 122 mmol/L — ABNORMAL HIGH (ref 98–111)
Creatinine, Ser: 2.38 mg/dL — ABNORMAL HIGH (ref 0.44–1.00)
GFR, Estimated: 25 mL/min — ABNORMAL LOW (ref >60)
Glucose, Bld: 98 mg/dL (ref 70–99)
Potassium: 4.4 mmol/L (ref 3.5–5.1)
Sodium: 150 mmol/L — ABNORMAL HIGH (ref 135–145)
Total Bilirubin: 0.8 mg/dL (ref 0.3–1.2)
Total Protein: 4.9 g/dL — ABNORMAL LOW (ref 6.5–8.1)

## 2021-11-17 LAB — CBC
HCT: 26.1 % — ABNORMAL LOW (ref 36.0–46.0)
Hemoglobin: 8.2 g/dL — ABNORMAL LOW (ref 12.0–15.0)
MCH: 32.7 pg (ref 26.0–34.0)
MCHC: 31.4 g/dL (ref 30.0–36.0)
MCV: 104 fL — ABNORMAL HIGH (ref 80.0–100.0)
Platelets: 97 10*3/uL — ABNORMAL LOW (ref 150–400)
RBC: 2.51 MIL/uL — ABNORMAL LOW (ref 3.87–5.11)
RDW: 16.7 % — ABNORMAL HIGH (ref 11.5–15.5)
WBC: 10.9 10*3/uL — ABNORMAL HIGH (ref 4.0–10.5)
nRBC: 0 % (ref 0.0–0.2)

## 2021-11-17 LAB — GLUCOSE, CAPILLARY
Glucose-Capillary: 93 mg/dL (ref 70–99)
Glucose-Capillary: 153 mg/dL — ABNORMAL HIGH (ref 70–99)
Glucose-Capillary: 129 mg/dL — ABNORMAL HIGH (ref 70–99)

## 2021-11-17 LAB — URINE CULTURE: Culture: 30000 — AB

## 2021-11-17 MED ORDER — CLOZAPINE 100 MG PO TABS: 300.0000 mg | ORAL_TABLET | Freq: Every day | ORAL | Status: DC

## 2021-11-17 MED ORDER — ONDANSETRON HCL 4 MG PO TABS: 4.0000 mg | ORAL_TABLET | Freq: Three times a day (TID) | ORAL | 0 refills | Status: DC | PRN

## 2021-11-17 NOTE — Evaluation (Signed)
Physical Therapy Evaluation Patient Details Name: [name] MRN: [id number] DOB: April 12, 1976 Today's Date: 11/17/2021  History of Present Illness  46 yo female admitted 7/7 with N/V and hypotension/tachycardia/lactic acidosis likely due to new medication on Ingrezza, viral gastroenteritis, euglycemic DKA. PMHx: seizure disorder, T2DM, schizophrenia, macrocytic anemia, thrombocytopenia.  Clinical Impression  Pt pleasant with flat affect, decreased processing and awareness who lives with provider per her report. Pt reports frequent falls but states she normally moves without assist and she does not perform homemaking. Pt likely at baseline without caregiver present to confirm. Pt with decreased balance and cognition who will benefit from trial of acute therapy to maximize independence and function with daily mobility and gait encouraged with nursing staff.     Max HR 135 with activity     Recommendations for follow up therapy are one component of a multi-disciplinary discharge planning process, led by the attending physician.  Recommendations may be updated based on patient status, additional functional criteria and insurance authorization.  Follow Up Recommendations No PT follow up      Assistance Recommended at Discharge Frequent or constant Supervision/Assistance  Patient can return home with the following  Assistance with cooking/housework;Direct supervision/assist for financial management;Assist for transportation;Help with stairs or ramp for entrance;Direct supervision/assist for medications management;A little help with walking and/or transfers    Equipment Recommendations None recommended by PT  Recommendations for Other Services       Functional Status Assessment Patient has had a recent decline in their functional status and demonstrates the ability to make significant improvements in function in a reasonable and predictable amount of time.     Precautions / Restrictions  Precautions Precautions: Fall Precaution Comments: watch HR      Mobility  Bed Mobility Overal bed mobility: Needs Assistance Bed Mobility: Supine to Sit     Supine to sit: Min guard     General bed mobility comments: guarding for safety with bed flat and no rail    Transfers Overall transfer level: Needs assistance   Transfers: Sit to/from Stand Sit to Stand: Min guard           General transfer comment: guarding for safety and direction, pt easily distracted and needs cues for safety to pull up underwear before stepping    Ambulation/Gait Ambulation/Gait assistance: Min assist Gait Distance (Feet): 400 Feet Assistive device: 1 person hand held assist Gait Pattern/deviations: Step-through pattern, Decreased stride length   Gait velocity interpretation: >2.62 ft/sec, indicative of community ambulatory   General Gait Details: pt initially with gait without UE support but after 25' pt repositioning self to therapist and requesting HHA. Pt with steady gait and good speed.  Stairs            Wheelchair Mobility    Modified Rankin (Stroke Patients Only)       Balance Overall balance assessment: History of Falls                                           Pertinent Vitals/Pain Pain Assessment Pain Assessment: No/denies pain    Home Living Family/patient expects to be discharged to:: Group home                        Prior Function Prior Level of Function : Needs assist  Mobility Comments: pt states she walks without AD, multiple falls ADLs Comments: pt states she does ADLs on her own, provider does the homemaking and cooking     Hand Dominance        Extremity/Trunk Assessment   Upper Extremity Assessment Upper Extremity Assessment: Generalized weakness    Lower Extremity Assessment Lower Extremity Assessment: Generalized weakness    Cervical / Trunk Assessment Cervical / Trunk Assessment:  Normal  Communication   Communication: No difficulties  Cognition Arousal/Alertness: Awake/alert Behavior During Therapy: Impulsive Overall Cognitive Status: No family/caregiver present to determine baseline cognitive functioning                                 General Comments: pt not oriented to day, situation or place, fixated on wanting breakfast and bath, perseverates on report of falls at home        General Comments      Exercises     Assessment/Plan    PT Assessment Patient needs continued PT services  PT Problem List Decreased mobility;Decreased safety awareness;Decreased balance;Decreased cognition;Decreased activity tolerance       PT Treatment Interventions Gait training;Balance training;Functional mobility training;Therapeutic activities;Cognitive remediation;Patient/family education;Neuromuscular re-education    PT Goals (Current goals can be found in the Care Plan section)  Acute Rehab PT Goals Patient Stated Goal: return home PT Goal Formulation: With patient Time For Goal Achievement: 12/01/21 Potential to Achieve Goals: Fair    Frequency Min 2X/week     Co-evaluation               AM-PAC PT "6 Clicks" Mobility  Outcome Measure Help needed turning from your back to your side while in a flat bed without using bedrails?: None Help needed moving from lying on your back to sitting on the side of a flat bed without using bedrails?: None Help needed moving to and from a bed to a chair (including a wheelchair)?: A Little Help needed standing up from a chair using your arms (e.g., wheelchair or bedside chair)?: A Little Help needed to walk in hospital room?: A Little Help needed climbing 3-5 steps with a railing? : A Little 6 Click Score: 20    End of Session   Activity Tolerance: Patient tolerated treatment well Patient left: in chair;with call bell/phone within reach;with chair alarm set Nurse Communication: Mobility status PT  Visit Diagnosis: Other abnormalities of gait and mobility (R26.89)    Time: 8937-3428 PT Time Calculation (min) (ACUTE ONLY): 24 min   Charges:   PT Evaluation $PT Eval Moderate Complexity: 1 Mod PT Treatments $Gait Training: 8-22 mins        [place] Office: [place] B Daiya Tamer 11/17/2021, 10:17 AM

## 2021-11-17 NOTE — Progress Notes (Signed)
Pt refused all meds, states "I don't want my medicine, it makes me feel bad". Call bell within reach, tele sitter in place, will continue to monitor.

## 2021-11-17 NOTE — Progress Notes (Addendum)
Blood pressure stable the whole night, HR 110's-120's bpm. Vomited 1x (put her finger inside her mouth to induced vomiting, BM 1x Type 6. Unable to measure all urine output as it mixed with the stool. Refused bladder scan. R.Sun, MD updated.   [name], BSN, [employer]

## 2021-11-17 NOTE — Discharge Instructions (Addendum)
Dear [name],  Thank you for letting us participate in your care. You were hospitalized for nausea, vomiting, and low blood pressure. We consulted our inpatient psychiatrist, who recommended some medication changes. Those are below. While in the hospital, you stopped vomiting and were able to eat and drink normally. We think you are now safe to go home.   POST-HOSPITAL & CARE INSTRUCTIONS STOP Seroquel. We believe this may be interacting with your other medications to cause your symptoms.  If you still have an elevated heart rate in a couple days, we recommend your primary care doctor (PCP) or outpatient psychiatrist reduce your Ingrezza from 80 mg to 60 mg to see if this helps.  STOP your glipizide until your primary care doctor tells you it is safe to restart.  We have sent a script for some nausea to your home pharmacy. Please use sparingly to help you keep down liquids.  Go to your follow up appointments (listed below)  DOCTOR'S APPOINTMENT   Future Appointments  Date Time Provider [place]  11/18/2021 11:30 AM Leeanne Rio, MD [facility]  04/08/2022  2:45 PM Alric Ran, MD [facility] None    Follow-up Information     Leeanne Rio, MD. Go on 11/18/2021.   Specialty: Family Medicine Why: At 11:30 am. Please arrive by 11:15 am. This is your hospital follow up with your priamry care doctor, [name]. Contact information: [place] [address] [phone]               Take care and be well!  [facility]  [place], [place] [address] [phone]

## 2021-11-17 NOTE — Consult Note (Addendum)
[facility] Consult   Reason for Consult: '' Hx of tardive dyskinesia and schizophrenia, presented with nausea/vomiting in setting of recently starting Uzedy (also on Clozapine, Quetiapine, Paxil, Ingrezza). Would greatly appreciate assistance on medication management.'' Referring Physician:  Sherolyn Buba, MD Patient Identification: [name] MRN:  [id number] Principal Diagnosis: Hypotension due to hypovolemia Diagnosis:  Principal Problem:   Hypotension due to hypovolemia (emesis)  Active Problems:   Type 2 diabetes mellitus with renal manifestations (HCC)   Schizophrenia (HCC)   CONSTIPATION, CHRONIC   AKI (acute kidney injury) ([place])   Acute lactic acidosis   Thrombocytopenia (HCC)   Macrocytic anemia   Lactic acidosis   Hypernatremia   Total Time spent with patient: 1 hour  Subjective:   [name] is a 46 y.o. female patient admitted with emesis, tachycardia and hypotension.  HPI:  Patient with long history of Schizophrenia, tardive dyskinesia, AKI, Type 2 diabetes mellitus and macrocytic anemia who presents to the hospital with  vomiting, tachycardia, low blood pressure.  Chart review revealed that patient had multiple similar episodes in the past. Also, collateral information by her Caregiver revealed recent medication change. Her current psychiatrist reportedly weaned her off Depakote, and started Uzedy subcutaneous monthly medication. Today, patient is alert, cooperative and states that she is feeling better. She denies psychosis, delusions and self harming thoughts.   Past Psychiatric History: as above Risk to Self:  denies Risk to Others:  denies Prior Inpatient Therapy:   Prior Outpatient Therapy:    Past Medical History:  Past Medical History:  Diagnosis Date   Acne    Anemia    Cognitive impairment    Diabetes mellitus without complication ([place])    Internal hemorrhoid, bleeding 10/22/2010   Obesity    Schizoaffective disorder    SVT  (supraventricular tachycardia) (HCC)    with short PR interval - followed by Dr. Lovena Le    Past Surgical History:  Procedure Laterality Date   BREAST REDUCTION SURGERY  08/10/2005   HYSTEROSCOPY  09/09/2004   w/ removal of polyps   KIDNEY SURGERY Left 05/17/2020   Tumor removal by Dr. Dema Severin   REDUCTION MAMMAPLASTY Bilateral 2007   SPINE SURGERY N/A 04/18/2020   Dr. Arnoldo Morale - pinched nerve of Lumbar Spine   Family History:  Family History  Problem Relation Age of Onset   Schizophrenia Sister    Family Psychiatric  History:   Social History:  Social History   Substance and Sexual Activity  Alcohol Use No     Social History   Substance and Sexual Activity  Drug Use No    Social History   Socioeconomic History   Marital status: Single    Spouse name: Not on file   Number of children: Not on file   Years of education: 10 years   Highest education level: 10th grade  Occupational History   Occupation: Disabled  Tobacco Use   Smoking status: Never   Smokeless tobacco: Never  Vaping Use   Vaping Use: Never used  Substance and Sexual Activity   Alcohol use: No   Drug use: No   Sexual activity: Never    Birth control/protection: Injection  Other Topics Concern   Not on file  Social History Narrative   Caregiver is Tax adviser. Lives in [facility] in [place], [phone]. Previously lived in group home. Twin sister with similar problem list. Has 24H care. Case manager is Justin Mend, [phone].      Patient recently started  going to [employer]. Phone: [phone]. [address] [address]      Caregiver works for [employer], [address] [place], [place] [address].       Guardian is Delaney Meigs of [employer] [place] [phone] office. After hours service [phone]   Social Determinants of Health   Financial Resource Strain: Low Risk  (12/23/2020)   Overall Financial Resource Strain (CARDIA)     Difficulty of Paying Living Expenses: Not hard at all  Food Insecurity: No Food Insecurity (12/23/2020)   Hunger Vital Sign    Worried About Running Out of Food in the Last Year: Never true    Ran Out of Food in the Last Year: Never true  Transportation Needs: No Transportation Needs (12/23/2020)   PRAPARE - Hydrologist (Medical): No    Lack of Transportation (Non-Medical): No  Physical Activity: Insufficiently Active (12/23/2020)   Exercise Vital Sign    Days of Exercise per Week: 5 days    Minutes of Exercise per Session: 10 min  Stress: Stress Concern Present (12/23/2020)   [place]    Feeling of Stress : To some extent  Social Connections: Socially Isolated (12/23/2020)   Social Connection and Isolation Panel [NHANES]    Frequency of Communication with Friends and Family: Once a week    Frequency of Social Gatherings with Friends and Family: Once a week    Attends Religious Services: 1 to 4 times per year    Active Member of [employer] or Organizations: No    Attends Archivist Meetings: Never    Marital Status: Never married   Additional Social History:    Allergies:   Allergies  Allergen Reactions   Minocycline     Unknown     Labs:  Results for orders placed or performed during the hospital encounter of 11/15/21 (from the past 48 hour(s))  CBC with Differential     Status: Abnormal   Collection Time: 11/15/21  6:57 PM  Result Value Ref Range   WBC 4.5 4.0 - 10.5 K/uL   RBC 3.41 (L) 3.87 - 5.11 MIL/uL   Hemoglobin 10.9 (L) 12.0 - 15.0 g/dL   HCT 36.1 36.0 - 46.0 %   MCV 105.9 (H) 80.0 - 100.0 fL   MCH 32.0 26.0 - 34.0 pg   MCHC 30.2 30.0 - 36.0 g/dL   RDW 16.0 (H) 11.5 - 15.5 %   Platelets PLATELET CLUMPS NOTED ON SMEAR, UNABLE TO ESTIMATE 150 - 400 K/uL    Comment:  CALLED HANNIE, RN AT 2021 11/15/21 BY D. LONG. PLTC AUTO VERIFIED AS 112 BUT PLATELET CLUMPS ARE SEEN  ON THE SMEAR CORRECTED ON 07/07 AT 2022: PREVIOUSLY REPORTED AS 112    nRBC 0.0 0.0 - 0.2 %   Neutrophils Relative % 75 %   Neutro Abs 3.3 1.7 - 7.7 K/uL   Lymphocytes Relative 16 %   Lymphs Abs 0.7 0.7 - 4.0 K/uL   Monocytes Relative 8 %   Monocytes Absolute 0.4 0.1 - 1.0 K/uL   Eosinophils Relative 0 %   Eosinophils Absolute 0.0 0.0 - 0.5 K/uL   Basophils Relative 0 %   Basophils Absolute 0.0 0.0 - 0.1 K/uL   WBC Morphology MILD LEFT SHIFT (1-5% METAS, OCC MYELO, OCC BANDS)    Immature Granulocytes 1 %   Abs Immature Granulocytes 0.06 0.00 - 0.07 K/uL   Giant PLTs  PRESENT     Comment: Performed at [facility], [place] [address], [place], [place] [address]  Comprehensive metabolic panel     Status: Abnormal   Collection Time: 11/15/21  6:57 PM  Result Value Ref Range   Sodium 143 135 - 145 mmol/L   Potassium 4.5 3.5 - 5.1 mmol/L   Chloride 114 (H) 98 - 111 mmol/L   CO2 19 (L) 22 - 32 mmol/L   Glucose, Bld 218 (H) 70 - 99 mg/dL    Comment: Glucose reference range applies only to samples taken after fasting for at least 8 hours.   BUN 24 (H) 6 - 20 mg/dL   Creatinine, Ser 2.86 (H) 0.44 - 1.00 mg/dL   Calcium 8.5 (L) 8.9 - 10.3 mg/dL   Total Protein 6.4 (L) 6.5 - 8.1 g/dL   Albumin 2.8 (L) 3.5 - 5.0 g/dL   AST 26 15 - 41 U/L   ALT 17 0 - 44 U/L   Alkaline Phosphatase 68 38 - 126 U/L   Total Bilirubin 0.6 0.3 - 1.2 mg/dL   GFR, Estimated 20 (L) >60 mL/min    Comment: (NOTE) Calculated using the CKD-EPI Creatinine Equation (2021)    Anion gap 10 5 - 15    Comment: Performed at [facility], [place] [address], [place], [place] [address]  Lipase, blood     Status: None   Collection Time: 11/15/21  6:57 PM  Result Value Ref Range   Lipase 22 11 - 51 U/L    Comment: Performed at [facility] [address], [place], [place] [address]  Lactic acid, plasma     Status: Abnormal   Collection Time: 11/15/21  6:57 PM  Result Value Ref Range   Lactic Acid, Venous 4.6  (HH) 0.5 - 1.9 mmol/L    Comment: CRITICAL RESULT CALLED TO, READ BACK BY AND VERIFIED WITH: A.ELLWANGER,RN '@2006'$  11/15/2021 [email or web address].J Performed at [facility], [place] [address], [place], [place] [address]   Culture, blood (routine x 2)     Status: None (Preliminary result)   Collection Time: 11/15/21  6:57 PM   Specimen: BLOOD  Result Value Ref Range   Specimen Description BLOOD RIGHT ANTECUBITAL    Special Requests      BOTTLES DRAWN AEROBIC AND ANAEROBIC Blood Culture results may not be optimal due to an excessive volume of blood received in culture bottles   Culture      NO GROWTH 2 DAYS Performed at [facility], [place] [address] [place], [place] [address]    Report Status PENDING   TSH     Status: None   Collection Time: 11/15/21  6:57 PM  Result Value Ref Range   TSH 1.049 0.350 - 4.500 uIU/mL    Comment: Performed by a 3rd Generation assay with a functional sensitivity of <=0.01 uIU/mL. Performed at [facility], [place] [address] [place], [place] [address]   Culture, blood (routine x 2)     Status: None (Preliminary result)   Collection Time: 11/15/21  7:14 PM   Specimen: BLOOD  Result Value Ref Range   Specimen Description BLOOD BLOOD LEFT HAND    Special Requests      BOTTLES DRAWN AEROBIC ONLY Blood Culture results may not be optimal due to an inadequate volume of blood received in culture bottles   Culture      NO GROWTH 2 DAYS Performed at [facility], [place]  [address] [place], [place] [address]    Report Status PENDING   Urinalysis, Routine w reflex microscopic Urine, Clean Catch     Status: Abnormal   Collection Time: 11/15/21  8:37 PM  Result Value Ref Range   Color, Urine YELLOW YELLOW   APPearance CLEAR CLEAR   Specific Gravity, Urine 1.008 1.005 - 1.030   pH 5.0 5.0 - 8.0   Glucose, UA >=500 (A) NEGATIVE mg/dL   Hgb urine dipstick NEGATIVE NEGATIVE   Bilirubin Urine NEGATIVE NEGATIVE   Ketones, ur NEGATIVE NEGATIVE mg/dL   Protein,  ur NEGATIVE NEGATIVE mg/dL   Nitrite NEGATIVE NEGATIVE   Leukocytes,Ua NEGATIVE NEGATIVE   RBC / HPF 0-5 0 - 5 RBC/hpf   WBC, UA 0-5 0 - 5 WBC/hpf   Bacteria, UA NONE SEEN NONE SEEN   Squamous Epithelial / LPF 0-5 0 - 5    Comment: Performed at [facility], [place] [address], [place], [place] [address]  Urine Culture     Status: Abnormal   Collection Time: 11/15/21  8:37 PM   Specimen: Urine, Clean Catch  Result Value Ref Range   Specimen Description URINE, CLEAN CATCH    Special Requests      NONE Performed at [facility], [place] [address], [place], [place] [address]    Culture (A)     30,000 COLONIES/mL MULTIPLE SPECIES PRESENT, SUGGEST RECOLLECTION   Report Status 11/17/2021 FINAL   Pregnancy, urine     Status: None   Collection Time: 11/15/21  8:37 PM  Result Value Ref Range   Preg Test, Ur NEGATIVE NEGATIVE    Comment:        THE SENSITIVITY OF THIS METHODOLOGY IS >20 mIU/mL. Performed at [facility], [place] [address], [place], [place] [address]   CBC with Differential/Platelet     Status: Abnormal   Collection Time: 11/15/21  8:40 PM  Result Value Ref Range   WBC 8.2 4.0 - 10.5 K/uL   RBC 2.55 (L) 3.87 - 5.11 MIL/uL   Hemoglobin 8.3 (L) 12.0 - 15.0 g/dL   HCT 27.4 (L) 36.0 - 46.0 %   MCV 107.5 (H) 80.0 - 100.0 fL   MCH 32.5 26.0 - 34.0 pg   MCHC 30.3 30.0 - 36.0 g/dL   RDW 15.9 (H) 11.5 - 15.5 %   Platelets 83 (L) 150 - 400 K/uL    Comment: Immature Platelet Fraction may be clinically indicated, consider ordering this additional test [id number] CONSISTENT WITH PREVIOUS RESULT REPEATED TO VERIFY    nRBC 0.0 0.0 - 0.2 %   Neutrophils Relative % 68 %   Neutro Abs 5.6 1.7 - 7.7 K/uL   Lymphocytes Relative 12 %   Lymphs Abs 1.0 0.7 - 4.0 K/uL   Monocytes Relative 13 %   Monocytes Absolute 1.1 (H) 0.1 - 1.0 K/uL   Eosinophils Relative 1 %   Eosinophils Absolute 0.1 0.0 - 0.5 K/uL   Basophils Relative 0 %   Basophils Absolute 0.0 0.0 - 0.1 K/uL    Immature Granulocytes 6 %   Abs Immature Granulocytes 0.45 (H) 0.00 - 0.07 K/uL    Comment: Performed at [facility] [address], [place],  [address]  Hemoglobin A1c     Status: Abnormal   Collection Time: 11/15/21  8:40 PM  Result Value Ref Range   Hgb A1c MFr Bld 6.3 (H) 4.8 - 5.6 %    Comment: (NOTE) Pre diabetes:  5.7%-6.4%  Diabetes:              >6.4%  Glycemic control for   <7.0% adults with diabetes    Mean Plasma Glucose 134.11 mg/dL    Comment: Performed at [facility] [address] [place], [place] [address]  Lactic acid, plasma     Status: Abnormal   Collection Time: 11/15/21 10:35 PM  Result Value Ref Range   Lactic Acid, Venous 4.1 (HH) 0.5 - 1.9 mmol/L    Comment: CRITICAL VALUE NOTED.  VALUE IS CONSISTENT WITH PREVIOUSLY REPORTED AND CALLED VALUE. Performed at [facility], [place] [address], [place], [place] [address]   Ketones, urine     Status: None   Collection Time: 11/16/21 12:22 AM  Result Value Ref Range   Ketones, ur NEGATIVE NEGATIVE mg/dL    Comment: Performed at [facility] [address] [place], [place] [address]  CBG monitoring, ED     Status: Abnormal   Collection Time: 11/16/21 12:55 AM  Result Value Ref Range   Glucose-Capillary 128 (H) 70 - 99 mg/dL    Comment: Glucose reference range applies only to samples taken after fasting for at least 8 hours.  I-Stat venous blood gas, ED     Status: Abnormal   Collection Time: 11/16/21  1:14 AM  Result Value Ref Range   pH, Ven 7.337 7.25 - 7.43   pCO2, Ven 34.5 (L) 44 - 60 mmHg   pO2, Ven 151 (H) 32 - 45 mmHg   Bicarbonate 18.5 (L) 20.0 - 28.0 mmol/L   TCO2 20 (L) 22 - 32 mmol/L   O2 Saturation 99 %   Acid-base deficit 7.0 (H) 0.0 - 2.0 mmol/L   Sodium 150 (H) 135 - 145 mmol/L   Potassium 4.6 3.5 - 5.1 mmol/L   Calcium, Ion 1.04 (L) 1.15 - 1.40 mmol/L   HCT 28.0 (L) 36.0 - 46.0 %   Hemoglobin 9.5 (L) 12.0 - 15.0 g/dL   Sample type VENOUS    Comprehensive metabolic panel     Status: Abnormal   Collection Time: 11/16/21  6:02 AM  Result Value Ref Range   Sodium 149 (H) 135 - 145 mmol/L   Potassium 4.8 3.5 - 5.1 mmol/L   Chloride 123 (H) 98 - 111 mmol/L   CO2 18 (L) 22 - 32 mmol/L   Glucose, Bld 100 (H) 70 - 99 mg/dL    Comment: Glucose reference range applies only to samples taken after fasting for at least 8 hours.   BUN 25 (H) 6 - 20 mg/dL   Creatinine, Ser 2.43 (H) 0.44 - 1.00 mg/dL   Calcium 7.6 (L) 8.9 - 10.3 mg/dL   Total Protein 4.8 (L) 6.5 - 8.1 g/dL   Albumin 2.0 (L) 3.5 - 5.0 g/dL   AST 20 15 - 41 U/L   ALT 14 0 - 44 U/L   Alkaline Phosphatase 47 38 - 126 U/L   Total Bilirubin 0.5 0.3 - 1.2 mg/dL   GFR, Estimated 24 (L) >60 mL/min    Comment: (NOTE) Calculated using the CKD-EPI Creatinine Equation (2021)    Anion gap 8 5 - 15    Comment: Performed at [facility], [place] [address], [place], [place] [address]  Vitamin B12     Status: None   Collection Time: 11/16/21  6:02 AM  Result Value Ref Range   Vitamin B-12 896 180 - 914 pg/mL    Comment: (NOTE)  This assay is not validated for testing neonatal or myeloproliferative syndrome specimens for Vitamin B12 levels. Performed at [facility], [place] [address], [place], [place] [address]   Folate     Status: None   Collection Time: 11/16/21  6:02 AM  Result Value Ref Range   Folate 12.6 >5.9 ng/mL    Comment: Performed at [facility] [address] [place], [place] [address]  CBC     Status: Abnormal   Collection Time: 11/16/21  6:02 AM  Result Value Ref Range   WBC 10.0 4.0 - 10.5 K/uL   RBC 2.59 (L) 3.87 - 5.11 MIL/uL   Hemoglobin 8.5 (L) 12.0 - 15.0 g/dL   HCT 26.9 (L) 36.0 - 46.0 %   MCV 103.9 (H) 80.0 - 100.0 fL   MCH 32.8 26.0 - 34.0 pg   MCHC 31.6 30.0 - 36.0 g/dL   RDW 16.2 (H) 11.5 - 15.5 %   Platelets 83 (L) 150 - 400 K/uL    Comment: Immature Platelet Fraction may be clinically indicated, consider ordering this  additional test [id number] CONSISTENT WITH PREVIOUS RESULT REPEATED TO VERIFY    nRBC 0.0 0.0 - 0.2 %    Comment: Performed at [facility], [place] [address], [place], [place] [address]  Lactic acid, plasma     Status: Abnormal   Collection Time: 11/16/21  6:02 AM  Result Value Ref Range   Lactic Acid, Venous 2.8 (HH) 0.5 - 1.9 mmol/L    Comment: CRITICAL VALUE NOTED.  VALUE IS CONSISTENT WITH PREVIOUSLY REPORTED AND CALLED VALUE. Performed at [facility], [place] [address] [place], [place] [address]   Glucose, capillary     Status: Abnormal   Collection Time: 11/16/21  7:45 AM  Result Value Ref Range   Glucose-Capillary 105 (H) 70 - 99 mg/dL    Comment: Glucose reference range applies only to samples taken after fasting for at least 8 hours.  Glucose, capillary     Status: Abnormal   Collection Time: 11/16/21 11:33 AM  Result Value Ref Range   Glucose-Capillary 110 (H) 70 - 99 mg/dL    Comment: Glucose reference range applies only to samples taken after fasting for at least 8 hours.  Lactic acid, plasma     Status: Abnormal   Collection Time: 11/16/21  2:31 PM  Result Value Ref Range   Lactic Acid, Venous 2.3 (HH) 0.5 - 1.9 mmol/L    Comment: CRITICAL VALUE NOTED.  VALUE IS CONSISTENT WITH PREVIOUSLY REPORTED AND CALLED VALUE. Performed at [facility], [place] [address] [place], [place] [address]   Glucose, capillary     Status: Abnormal   Collection Time: 11/16/21  4:15 PM  Result Value Ref Range   Glucose-Capillary 117 (H) 70 - 99 mg/dL    Comment: Glucose reference range applies only to samples taken after fasting for at least 8 hours.  Lactic acid, plasma     Status: None   Collection Time: 11/16/21  6:01 PM  Result Value Ref Range   Lactic Acid, Venous 1.9 0.5 - 1.9 mmol/L    Comment: Performed at [facility] [address], [place], [place] [address]  Glucose, capillary     Status: Abnormal   Collection Time: 11/16/21  9:41 PM  Result Value Ref  Range   Glucose-Capillary 145 (H) 70 - 99 mg/dL    Comment: Glucose reference range applies only to samples taken after fasting for  at least 8 hours.  CBC     Status: Abnormal   Collection Time: 11/17/21  7:41 AM  Result Value Ref Range   WBC 10.9 (H) 4.0 - 10.5 K/uL   RBC 2.51 (L) 3.87 - 5.11 MIL/uL   Hemoglobin 8.2 (L) 12.0 - 15.0 g/dL   HCT 26.1 (L) 36.0 - 46.0 %   MCV 104.0 (H) 80.0 - 100.0 fL   MCH 32.7 26.0 - 34.0 pg   MCHC 31.4 30.0 - 36.0 g/dL   RDW 16.7 (H) 11.5 - 15.5 %   Platelets 97 (L) 150 - 400 K/uL    Comment: SPECIMEN CHECKED FOR CLOTS Immature Platelet Fraction may be clinically indicated, consider ordering this additional test [id number] CONSISTENT WITH PREVIOUS RESULT REPEATED TO VERIFY    nRBC 0.0 0.0 - 0.2 %    Comment: Performed at [facility], [place] [address] [place], [place] [address]  Comprehensive metabolic panel     Status: Abnormal   Collection Time: 11/17/21  7:41 AM  Result Value Ref Range   Sodium 150 (H) 135 - 145 mmol/L   Potassium 4.4 3.5 - 5.1 mmol/L   Chloride 122 (H) 98 - 111 mmol/L   CO2 22 22 - 32 mmol/L   Glucose, Bld 98 70 - 99 mg/dL    Comment: Glucose reference range applies only to samples taken after fasting for at least 8 hours.   BUN 16 6 - 20 mg/dL   Creatinine, Ser 2.38 (H) 0.44 - 1.00 mg/dL   Calcium 8.5 (L) 8.9 - 10.3 mg/dL   Total Protein 4.9 (L) 6.5 - 8.1 g/dL   Albumin 2.0 (L) 3.5 - 5.0 g/dL   AST 13 (L) 15 - 41 U/L   ALT 13 0 - 44 U/L   Alkaline Phosphatase 58 38 - 126 U/L   Total Bilirubin 0.8 0.3 - 1.2 mg/dL   GFR, Estimated 25 (L) >60 mL/min    Comment: (NOTE) Calculated using the CKD-EPI Creatinine Equation (2021)    Anion gap 6 5 - 15    Comment: Performed at [facility], [place] [address], [place], [place] [address]  Glucose, capillary     Status: None   Collection Time: 11/17/21  8:07 AM  Result Value Ref Range   Glucose-Capillary 93 70 - 99 mg/dL    Comment: Glucose reference range applies  only to samples taken after fasting for at least 8 hours.    Current Facility-Administered Medications  Medication Dose Route Frequency Provider Last Rate Last Admin   aspirin chewable tablet 81 mg  81 mg Oral Daily Lowry Ram, MD       atorvastatin (LIPITOR) tablet 20 mg  20 mg Oral QHS Lowry Ram, MD       cloZAPine (CLOZARIL) tablet 300 mg  300 mg Oral QHS Dameron, Marisa, DO   300 mg at 11/16/21 2330   famotidine (PEPCID) tablet 20 mg  20 mg Oral Daily Dameron, Marisa, DO       heparin injection 5,000 Units  5,000 Units Subcutaneous Q8H Espinoza, Alejandra, DO   5,000 Units at 11/16/21 2130   insulin aspart (novoLOG) injection 0-9 Units  0-9 Units Subcutaneous TID WC Lowry Ram, MD       multivitamin with minerals tablet 1 tablet  1 tablet Oral Daily Lowry Ram, MD       PARoxetine (PAXIL) tablet 10 mg  10 mg Oral Daily Lowry Ram, MD   10 mg at 11/16/21 1700  polyethylene glycol (MIRALAX / GLYCOLAX) packet 17 g  17 g Oral QHS Lowry Ram, MD       senna-docusate (Senokot-S) tablet 1 tablet  1 tablet Oral QHS Lowry Ram, MD       valbenazine [facility]) capsule 80 mg  80 mg Oral QHS Lowry Ram, MD   80 mg at 11/16/21 2330    Musculoskeletal: Strength & Muscle Tone: within normal limits Gait & Station: unsteady Patient leans: N/A    Psychiatric Specialty Exam:  Presentation  General Appearance: Appropriate for Environment  Eye Contact:Good  Speech:Clear and Coherent; Slow  Speech Volume:Decreased  Handedness:Right   Mood and Affect  Mood:Dysphoric  Affect:Constricted   Thought Process  Thought Processes:Disorganized  Descriptions of Associations:Tangential  Orientation:Partial  Thought Content:Tangential  History of Schizophrenia/Schizoaffective disorder:No data recorded Duration of Psychotic Symptoms:No data recorded Hallucinations:Hallucinations: None  Ideas of Reference:None  Suicidal Thoughts:Suicidal Thoughts:  No  Homicidal Thoughts:Homicidal Thoughts: No   Sensorium  Memory:-- (unable to assess)  Judgment:Poor  Insight:Poor   Executive Functions  Concentration:Fair  Attention Span:Fair  Recall:Poor  Fund of Knowledge:Poor  Language:Fair   Psychomotor Activity  Psychomotor Activity:Psychomotor Activity: Tremor Extrapyramidal Side Effects (EPS): Tardive Dyskinesia AIMS Completed?: No   Assets  Assets:Desire for Improvement; Social Support   Sleep  Sleep:Sleep: Fair   Physical Exam: Physical Exam Review of Systems  Psychiatric/Behavioral:  The patient is nervous/anxious.    Blood pressure 95/70, pulse (!) 135, temperature (!) 97.4 F (36.3 C), temperature source Oral, resp. rate 18, height '5\' 2"'$  (1.575 m), weight 53.1 kg, SpO2 100 %. Body mass index is 21.4 kg/m.  Treatment Plan Summary: 46 year old female with history of tardive dyskinesia and schizophrenia who was admitted to the hospital due to Hypotension, tachycardia, nausea/vomiting in setting of recently starting Uzedy. Patient is also on multiple anti-psychotic: Clozapine, Quetiapine, Paxil, Ingrezza. Her symptoms maybe related to polypharmacy or underlying medical issues.   Plan/Recommendations: -Will discontinue Seroquel-may worsen dizziness and other symptoms when taking with Clozapine, Paxil, Ingrezza and Uzedy -Continue Clozapine for schizophrenia -Continue Paxil for anxiety/depression Continue  Ingrezza 80 mg at bedtime for tardive dyskinesia -Consider decreasing Ingrezza from 80 mg to 60 mg if tachycardia does not improve in 48 hours   Disposition: No evidence of imminent risk to self or others at present.   Patient does not meet criteria for psychiatric inpatient admission. Psychiatric service signing out. Re-consult as needed  Corena Pilgrim, MD 11/17/2021 11:17 AM

## 2021-11-17 NOTE — Progress Notes (Signed)
Patient is agitated. Rescheduled lab draws. Nancy Fetter, MD aware.

## 2021-11-17 NOTE — Plan of Care (Signed)

## 2021-11-17 NOTE — Discharge Summary (Signed)
[facility] Discharge Summary  Patient name: [name] Medical record number: [id number] Date of birth: [birth year] Age: 46 y.o. Gender: female Date of Admission: 11/15/2021  Date of Discharge: 11/17/2021 Admitting Physician: Lowry Ram, MD  Primary Care Provider: Leeanne Rio, MD Consultants: Psychiatry  Indication for Hospitalization: Hypotension, AKI, lactic acidosis secondary to nausea vomiting  Brief Hospital Course:  [name] is a 46 year old female with past medical history significant for type 2 diabetes, schizophrenia with history of tardive dyskinesia, CKD 3 as s/p partial left nephrectomy and papillary renal cell carcinoma, HLD, cerebral atrophy who presented with nausea and vomiting and found to be in lactic acidosis.  Hospital course outlined with the problems below.   Hypotension Patient was hypotensive to 80s over 50s with 1 day of recurrent emesis.  Hypotension secondary to dehydration secondary to emesis. Patient received 2 boluses of 1 L normal saline in ED and was resuscitated with 1.5 maintenance fluids.  Glipizide, Jardiance, and linagliptin all held on admission.  Patient's blood pressure normalized with vigorous p.o. intake of fluids and solids.  Patient appears to be asymptomatic, on discharge had normal BP.  Lactic acidosis Patient had lactic acid of 4.6 on arrival which improved to 4.1 after 2 L bolus.  Patient met SIRS criteria in ED.  Blood cultures were collected and she received a dose of Zosyn.  Blood cultures showed no growth in 2 days.  Urine cultures nonsignificant, likely dirty catch.  CT abdomen pelvis without acute findings.  Urine ketones negative.  Lactic acidosis resolved with improved p.o. intake.  AKI Nonoliguric AKI on CKD stage IIIb.  Most likely due to hypoperfusion secondary to decreased p.o. intake and emesis.  Creatinine on arrival was 2.86 from baseline of 1.6.  Patient was treated with 1.5  maintenance fluids.  Patient declined bladder scans, was reported to urinate on her own.  Creatinine trended down to 2.3 on day of discharge.  Recommend PCP to trend labs normal.  Type 2 diabetes A1c 6.3 on arrival.  Fasting blood sugars reviewed with caregiver showed range of 72-1 30 although she mentioned hypoglycemic episodes in the home.  Jardiance, Tradjenta, glipizide were held during admission and patient was treated with SSI and adjusted as needed.  Upon discharge, patient was recommended to only restart Jardiance.  Recommend PCP instruct restart of Tradjenta and glipizide as appropriate  Schizophrenia Patient recently switched to Uzendy from Depakote.  Medications which possibly causing emesis.  Has shivering tongue twitches and drooling.  Patient has history of tar dive dyskinesia.  Patient was continued on clozapine Ingrezza quetiapine and Paxil during admission.  Inpatient psychiatry consulted, recommended to discontinue Seroquel as that may worsen dizziness and other symptoms when used concurrently with her other medications.  Psychiatry also recommended to decrease Ingrezza from 80 mg to 60 mg if her tachycardia did not improve in 48 hours.  Constipation Patient has history of chronic constipation most likely secondary to longtime use of clozapine.  Patient is being transitioned off of clozapine by her outpatient psychiatrist.  CTAP showed large diffuse colonic stool burden.  Patient was continued on senna, MiraLAX increased to 35 g twice daily, and was given enema.  PCP to follow-up.  Discharge Diagnoses/Problem List:  Principal Problem for Admission: Hypotension, nausea, vomiting Other Problems addressed during stay:  Polypharmacy  Disposition: Home with caretaker  Discharge Condition: Stable yet  Discharge Exam:  Vitals:   11/17/21 1229 11/17/21 1631  BP: 120/77 113/79  Pulse: (!) 125  100  Resp: 18 15  Temp: 98.7 F (37.1 C) 98.6 F (37 C)  SpO2: 97% (!) 4%   General:  Awake, alert, oriented Cardiovascular: Regular rate and rhythm, S1 and S2 present, no murmurs auscultated Respiratory: Lung fields clear to auscultation bilaterally  Issues for Follow Up:  PCP to recheck BP (ensure normotensive), constipation, hypoglycemic episodes Creatinine on day of discharge 2.38, improved with vigorous p.o. intake. PCP to trend down to normal. Psych consult discontinued Seroquel saying may worsen dizziness and other symptoms when taking with Clozapine, Paxil, Ingrezza and Uzedy. Psych consult also recommened decreasing Ingrezza from 80 mg to 60 mg if tachycardia does not improve in 48 hours (by Tues 7/11). We will leave this to the PCP or outpatient psychiatrist.  Held glipizide, linagliptin, and jardiance on admission. Restarted jardiance on discharge. Recommended to hold glipizide and linagliptin until PCP instructs to restart.   Significant Procedures: None  Significant Labs and Imaging:  Recent Labs  Lab 11/15/21 2040 11/16/21 0114 11/16/21 0602 11/17/21 0741  WBC 8.2  --  10.0 10.9*  HGB 8.3* 9.5* 8.5* 8.2*  HCT 27.4* 28.0* 26.9* 26.1*  PLT 83*  --  83* 97*   Recent Labs  Lab 11/16/21 0114 11/16/21 0602 11/17/21 0741  NA 150* 149* 150*  K 4.6 4.8 4.4  CL  --  123* 122*  CO2  --  18* 22  GLUCOSE  --  100* 98  BUN  --  25* 16  CREATININE  --  2.43* 2.38*  CALCIUM  --  7.6* 8.5*  ALKPHOS  --  47 58  AST  --  20 13*  ALT  --  14 13  ALBUMIN  --  2.0* 2.0*   CT ABDOMEN AND PELVIS WITHOUT CONTRAST 11/15/2021 IMPRESSION: 1. Patulous distal esophagus with wall thickening at the gastroesophageal junction and small hiatal hernia, suggesting reflux or esophagitis. 2. Fluid within nondilated stomach and small bowel, can be seen with enteritis in the appropriate clinical setting. No obstruction. 3. Large diffuse colonic stool burden, suggestive of constipation.  Results/Tests Pending at Time of Discharge: None  Discharge Medications:  Allergies as  of 11/17/2021       Reactions   Minocycline    Unknown         Medication List     STOP taking these medications    glipiZIDE 5 MG tablet Commonly known as: GLUCOTROL   QUEtiapine 50 MG tablet Commonly known as: SEROQUEL   Tradjenta 5 MG Tabs tablet Generic drug: linagliptin       TAKE these medications    acetaminophen 500 MG tablet Commonly known as: GoodSense Pain Relief Extra St Take 1 tablet (500 mg total) by mouth every 8 (eight) hours as needed for mild pain or fever.   aspirin 81 MG chewable tablet Commonly known as: GoodSense Aspirin Chew 1 tablet (81 mg total) by mouth daily.   atorvastatin 20 MG tablet Commonly known as: LIPITOR TAKE ONE TABLET BY MOUTH at bedtime What changed: when to take this   cetaphil cream APPLY DAILY What changed:  how much to take how to take this   cloZAPine 100 MG tablet Commonly known as: CLOZARIL Take 3 tablets (300 mg total) by mouth at bedtime for 7 days.   divalproex 500 MG 24 hr tablet Commonly known as: DEPAKOTE ER Take 2,000 mg by mouth at bedtime.   famotidine 20 MG tablet Commonly known as: PEPCID Take 1 tablet (20 mg total) by mouth 2 (  two) times daily.   glycopyrrolate 1 MG tablet Commonly known as: ROBINUL Take 1 mg by mouth daily.   Incontinence Brief Medium Misc Wear every night   Ingrezza 80 MG capsule Generic drug: valbenazine Take 80 mg by mouth at bedtime.   Jardiance 10 MG Tabs tablet Generic drug: empagliflozin TAKE ONE TABLET BY MOUTH ONCE DAILY What changed: how much to take   medroxyPROGESTERone 150 MG/ML injection Commonly known as: DEPO-PROVERA Inject 150 mg into the muscle every 3 (three) months.   multivitamin with minerals Tabs tablet Take 1 tablet by mouth daily.   ondansetron 4 MG tablet Commonly known as: Zofran Take 1 tablet (4 mg total) by mouth every 8 (eight) hours as needed for nausea or vomiting.   ONE TOUCH DELICA LANCING DEV Misc Check blood sugar once  daily   OneTouch Delica Lancets 27O Misc Check blood sugar once daily   OneTouch Verio test strip Generic drug: glucose blood CHECK BLOOD GLUCOSE (SUGAR) EVERY DAY   OneTouch Verio w/Device Kit Check blood sugar once daily   PARoxetine 10 MG tablet Commonly known as: PAXIL Take 10 mg by mouth daily.   polyethylene glycol powder 17 GM/SCOOP powder Commonly known as: GLYCOLAX/MIRALAX Take 17 g by mouth at bedtime.   Senexon-S 8.6-50 MG tablet Generic drug: senna-docusate Take 1 tablet by mouth at bedtime.   simethicone 125 MG chewable tablet Commonly known as: Gas-X Extra Strength Chew 1 tablet (125 mg total) by mouth daily as needed for flatulence.       Discharge Instructions: Please refer to Patient Instructions section of EMR for full details.  Patient was counseled important signs and symptoms that should prompt return to medical care, changes in medications, dietary instructions, activity restrictions, and follow up appointments.   Follow-Up Appointments:  Follow-up Information     Leeanne Rio, MD. Go on 11/18/2021.   Specialty: Family Medicine Why: At 11:30 am. Please arrive by 11:15 am. This is your hospital follow up with your priamry care doctor, [name]. Contact information: [place] [address] [phone]                 Ezequiel Essex, MD 11/17/2021, 7:00 PM PGY-3, [place]

## 2021-11-17 NOTE — Plan of Care (Signed)
  Problem: Coping: Goal: Ability to adjust to condition or change in health will improve Outcome: Progressing   Problem: Tissue Perfusion: Goal: Adequacy of tissue perfusion will improve Outcome: Progressing   Problem: Clinical Measurements: Goal: Ability to maintain clinical measurements within normal limits will improve Outcome: Progressing   Problem: Clinical Measurements: Goal: Diagnostic test results will improve Outcome: Progressing   Problem: Safety: Goal: Ability to remain free from injury will improve Outcome: Progressing

## 2021-11-18 ENCOUNTER — Ambulatory Visit (INDEPENDENT_AMBULATORY_CARE_PROVIDER_SITE_OTHER): Payer: Medicare Other | Admitting: Family Medicine

## 2021-11-18 ENCOUNTER — Encounter: Payer: Self-pay | Admitting: Family Medicine

## 2021-11-18 VITALS — BP 117/68 | HR 116 | Wt 118.2 lb

## 2021-11-18 DIAGNOSIS — E785 Hyperlipidemia, unspecified: Secondary | ICD-10-CM | POA: Diagnosis not present

## 2021-11-18 DIAGNOSIS — F209 Schizophrenia, unspecified: Secondary | ICD-10-CM | POA: Diagnosis not present

## 2021-11-18 DIAGNOSIS — K5909 Other constipation: Secondary | ICD-10-CM | POA: Diagnosis not present

## 2021-11-18 DIAGNOSIS — N183 Chronic kidney disease, stage 3 unspecified: Secondary | ICD-10-CM | POA: Diagnosis not present

## 2021-11-18 DIAGNOSIS — E1122 Type 2 diabetes mellitus with diabetic chronic kidney disease: Secondary | ICD-10-CM | POA: Diagnosis not present

## 2021-11-18 DIAGNOSIS — E1169 Type 2 diabetes mellitus with other specified complication: Secondary | ICD-10-CM

## 2021-11-18 MED ORDER — POLYETHYLENE GLYCOL 3350 17 GM/SCOOP PO POWD
17.0000 g | Freq: Two times a day (BID) | ORAL | 3 refills | Status: DC
Start: 2021-11-18 — End: 2021-12-10

## 2021-11-18 MED ORDER — SENNOSIDES-DOCUSATE SODIUM 8.6-50 MG PO TABS
1.0000 | ORAL_TABLET | Freq: Every day | ORAL | 0 refills | Status: DC
Start: 2021-11-18 — End: 2021-12-10

## 2021-11-18 NOTE — Progress Notes (Signed)
  Date of Visit: 11/18/2021   SUBJECTIVE:   HPI:  [name] presents today with Lanier Ensign, her therapeutic provider, for hospital follow-up for 7/7-7/9 admission for hypotension, lactic acidosis secondary to nausea and vomiting, and AKI.  GI: Patient has vomited x2 today, clear liquid, non-bloody. She continues to endorse nausea. She is taking her Pepcid daily. She was prescribed Zofran PRN but has not yet started this. [name] and patient endorses constipation. Patient has 1 BM per week. She takes Senna and Miralax daily. No dizziness.  Mood: Patient has stopped Quetiapine due to inpatient psychiatry recommendations. Since then, [name] noticed patient drools less and is more active, aware, and able to do ADLs independently. She was unable to sleep last night without Quetiapine. [name] will follow-up with psychiatry for other medication recommendations. Is awaiting a call back from psych NP for recommendations. Inpatient psych team had also recommended decreasing ingrezza from '80mg'$  to '60mg'$  if tachycardia persisted beyond 7/11  Diabetes: Patient's AM blood sugars range from 72-78. Morning BG was 148. Patient is taking her Jardiance. Other medications for diabetes were held at time of hospital discharge.   OBJECTIVE:   BP 117/68   Pulse (!) 116   Wt 118 lb 3.2 oz (53.6 kg)   SpO2 100%   BMI 21.62 kg/m  Gen: Well-appearing female in no acute distress. HEENT: Non-traumatic. Heart: Normal rate and rhythm. No murmurs, rubs, or gallops. Lungs: Normal pulmonary effort. No wheezing, rhonchi, or rales. Abdomen: soft, nontender to palpation  Neuro: Bilateral tremors in hands noted. Ext: Moves all extremities equally.  ASSESSMENT/PLAN:   Health maintenance:  -Eye exam scheduled for 07/31/2022  Nausea and vomiting Appears well hydrated today, though with a couple episodes of vomiting today. Continue Pepcid '20mg'$  qD. Start previously prescribed Zofran '4mg'$  as needed, advised ok to take  this. Continue to monitor, follow up if not improving. Recheck BMET today.  Type 2 diabetes mellitus with renal manifestations (HCC) Adequate control of sugars presently. Continue Jardiance; continue holding glipizide and gliptin.  Check lipids today  CONSTIPATION, CHRONIC Having bowel movements once every week. Increase water intake. Continue Senna-Docusate. Increase Miralax to BID. Refilled medications.  Hyperlipidemia associated with type 2 diabetes mellitus ([place]) Lipid panel today  Schizophrenia [facility]) Follow-up with psychiatry NP for medication changes.  AKI -BMP today  FOLLOW UP: Follow up in 01/06/2022 for routine follow-up.  Leonette Nutting, [facility]  Patient seen along with MS3 student Leonette Nutting. I personally evaluated this patient along with the student, and verified all aspects of the history, physical exam, and medical decision making as documented by the student. I agree with the student's documentation and have made all necessary edits.  Chrisandra Netters, MD  [place]

## 2021-11-18 NOTE — Assessment & Plan Note (Addendum)
Lipid panel today

## 2021-11-18 NOTE — Patient Instructions (Signed)
It was great to see you again today!  Increase miralax to twice daily  Checking labs today - kidneys, cholesterol  Completed physical forms  Follow up in August, sooner if needed  Be well, [name]

## 2021-11-18 NOTE — Assessment & Plan Note (Addendum)
Adequate control of sugars presently. Continue Jardiance; continue holding glipizide and gliptin.  Check lipids today

## 2021-11-18 NOTE — Assessment & Plan Note (Addendum)
Follow-up with psychiatry NP for medication changes.

## 2021-11-18 NOTE — Assessment & Plan Note (Addendum)
Increase water intake. Continue Senna-Docusate. Increase Miralax to BID. Refilled medications.

## 2021-11-18 NOTE — Progress Notes (Signed)
error 

## 2021-11-19 ENCOUNTER — Telehealth: Payer: Self-pay | Admitting: Family Medicine

## 2021-11-19 DIAGNOSIS — D649 Anemia, unspecified: Secondary | ICD-10-CM

## 2021-11-19 DIAGNOSIS — E870 Hyperosmolality and hypernatremia: Secondary | ICD-10-CM

## 2021-11-19 LAB — BASIC METABOLIC PANEL
BUN/Creatinine Ratio: 8 — ABNORMAL LOW (ref 9–23)
BUN: 18 mg/dL (ref 6–24)
CO2: 19 mmol/L — ABNORMAL LOW (ref 20–29)
Calcium: 8.1 mg/dL — ABNORMAL LOW (ref 8.7–10.2)
Chloride: 117 mmol/L (ref 96–106)
Creatinine, Ser: 2.13 mg/dL — ABNORMAL HIGH (ref 0.57–1.00)
Glucose: 129 mg/dL — ABNORMAL HIGH (ref 70–99)
Potassium: 4.5 mmol/L (ref 3.5–5.2)
Sodium: 150 mmol/L — ABNORMAL HIGH (ref 134–144)
eGFR: 28 mL/min/{1.73_m2} — ABNORMAL LOW (ref >59)

## 2021-11-19 LAB — LIPID PANEL
Chol/HDL Ratio: 2 ratio (ref 0.0–4.4)
Cholesterol, Total: 63 mg/dL — ABNORMAL LOW (ref 100–199)
HDL: 32 mg/dL — ABNORMAL LOW (ref >39)
LDL Chol Calc (NIH): 16 mg/dL (ref 0–99)
Triglycerides: 67 mg/dL (ref 0–149)
VLDL Cholesterol Cal: 15 mg/dL (ref 5–40)

## 2021-11-19 NOTE — Telephone Encounter (Signed)
Called patient's caregiver [name] to discuss results from yesterday.  Sodium and chloride continue to be elevated.  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  I think she would be best served by being seen by endocrinology to evaluate this.  Will place referral.  Caregiver reports [name] is constantly cold.  Asks about whether she is anemic.  Records reviewed, hemoglobin in the low eights this hospitalization with macrocytosis.  B12 and folate were normal.  I do not see that she has had any iron studies.  We will add on iron, TIBC, and ferritin.  [name] is sending the order requisition to [facility] to have these added on.  Jacqueline Rio, MD

## 2021-11-20 LAB — CULTURE, BLOOD (ROUTINE X 2)
Culture: NO GROWTH
Culture: NO GROWTH

## 2021-11-25 ENCOUNTER — Other Ambulatory Visit: Payer: Self-pay

## 2021-11-25 DIAGNOSIS — E1122 Type 2 diabetes mellitus with diabetic chronic kidney disease: Secondary | ICD-10-CM

## 2021-11-26 MED ORDER — ONETOUCH VERIO W/DEVICE KIT: PACK | 0 refills | Status: DC

## 2021-11-26 MED ORDER — ONETOUCH VERIO VI STRP: ORAL_STRIP | 3 refills | Status: DC

## 2021-11-26 MED ORDER — ONETOUCH DELICA LANCETS 33G MISC: 3 refills | Status: DC

## 2021-12-02 ENCOUNTER — Emergency Department (HOSPITAL_COMMUNITY): Payer: Medicare Other

## 2021-12-02 ENCOUNTER — Other Ambulatory Visit: Payer: Self-pay

## 2021-12-02 ENCOUNTER — Telehealth: Payer: Self-pay

## 2021-12-02 ENCOUNTER — Emergency Department (HOSPITAL_COMMUNITY)
Admission: EM | Admit: 2021-12-02 | Discharge: 2021-12-02 | Disposition: A | Discharge status: Home / Self Care | Payer: Medicare Other | Attending: Emergency Medicine | Admitting: Emergency Medicine

## 2021-12-02 DIAGNOSIS — E119 Type 2 diabetes mellitus without complications: Secondary | ICD-10-CM | POA: Insufficient documentation

## 2021-12-02 DIAGNOSIS — Z79899 Other long term (current) drug therapy: Secondary | ICD-10-CM | POA: Insufficient documentation

## 2021-12-02 DIAGNOSIS — R410 Disorientation, unspecified: Secondary | ICD-10-CM | POA: Diagnosis not present

## 2021-12-02 DIAGNOSIS — N183 Chronic kidney disease, stage 3 unspecified: Secondary | ICD-10-CM | POA: Diagnosis not present

## 2021-12-02 DIAGNOSIS — R519 Headache, unspecified: Secondary | ICD-10-CM | POA: Diagnosis not present

## 2021-12-02 DIAGNOSIS — Z7984 Long term (current) use of oral hypoglycemic drugs: Secondary | ICD-10-CM | POA: Diagnosis not present

## 2021-12-02 DIAGNOSIS — M542 Cervicalgia: Secondary | ICD-10-CM | POA: Diagnosis not present

## 2021-12-02 DIAGNOSIS — R000 Tachycardia, unspecified: Secondary | ICD-10-CM | POA: Diagnosis not present

## 2021-12-02 DIAGNOSIS — W19XXXA Unspecified fall, initial encounter: Secondary | ICD-10-CM | POA: Diagnosis not present

## 2021-12-02 DIAGNOSIS — R569 Unspecified convulsions: Secondary | ICD-10-CM

## 2021-12-02 DIAGNOSIS — Z7982 Long term (current) use of aspirin: Secondary | ICD-10-CM | POA: Insufficient documentation

## 2021-12-02 DIAGNOSIS — R251 Tremor, unspecified: Secondary | ICD-10-CM | POA: Insufficient documentation

## 2021-12-02 DIAGNOSIS — E1129 Type 2 diabetes mellitus with other diabetic kidney complication: Secondary | ICD-10-CM | POA: Diagnosis present

## 2021-12-02 DIAGNOSIS — F209 Schizophrenia, unspecified: Secondary | ICD-10-CM | POA: Diagnosis present

## 2021-12-02 LAB — I-STAT BETA HCG BLOOD, ED (MC, WL, AP ONLY): I-stat hCG, quantitative: <5 m[IU]/mL (ref <5)

## 2021-12-02 LAB — URINALYSIS, ROUTINE W REFLEX MICROSCOPIC
Bacteria, UA: NONE SEEN
Bilirubin Urine: NEGATIVE
Glucose, UA: >=500 mg/dL — AB
Hgb urine dipstick: NEGATIVE
Ketones, ur: NEGATIVE mg/dL
Nitrite: NEGATIVE
Protein, ur: NEGATIVE mg/dL
Specific Gravity, Urine: 1.005 (ref 1.005–1.030)
pH: 6 (ref 5.0–8.0)

## 2021-12-02 LAB — CBC WITH DIFFERENTIAL/PLATELET
Abs Immature Granulocytes: 0.02 10*3/uL (ref 0.00–0.07)
Basophils Absolute: 0 10*3/uL (ref 0.0–0.1)
Basophils Relative: 0 %
Eosinophils Absolute: 0.2 10*3/uL (ref 0.0–0.5)
Eosinophils Relative: 2 %
HCT: 30.9 % — ABNORMAL LOW (ref 36.0–46.0)
Hemoglobin: 9.5 g/dL — ABNORMAL LOW (ref 12.0–15.0)
Immature Granulocytes: 0 %
Lymphocytes Relative: 22 %
Lymphs Abs: 1.5 10*3/uL (ref 0.7–4.0)
MCH: 32.3 pg (ref 26.0–34.0)
MCHC: 30.7 g/dL (ref 30.0–36.0)
MCV: 105.1 fL — ABNORMAL HIGH (ref 80.0–100.0)
Monocytes Absolute: 0.5 10*3/uL (ref 0.1–1.0)
Monocytes Relative: 7 %
Neutro Abs: 4.8 10*3/uL (ref 1.7–7.7)
Neutrophils Relative %: 69 %
Platelets: 224 10*3/uL (ref 150–400)
RBC: 2.94 MIL/uL — ABNORMAL LOW (ref 3.87–5.11)
RDW: 14.6 % (ref 11.5–15.5)
WBC: 7.1 10*3/uL (ref 4.0–10.5)
nRBC: 0 % (ref 0.0–0.2)

## 2021-12-02 LAB — COMPREHENSIVE METABOLIC PANEL
ALT: 18 U/L (ref 0–44)
AST: 28 U/L (ref 15–41)
Albumin: 2.8 g/dL — ABNORMAL LOW (ref 3.5–5.0)
Alkaline Phosphatase: 92 U/L (ref 38–126)
Anion gap: 13 (ref 5–15)
BUN: 12 mg/dL (ref 6–20)
CO2: 21 mmol/L — ABNORMAL LOW (ref 22–32)
Calcium: 9.1 mg/dL (ref 8.9–10.3)
Chloride: 111 mmol/L (ref 98–111)
Creatinine, Ser: 2.4 mg/dL — ABNORMAL HIGH (ref 0.44–1.00)
GFR, Estimated: 24 mL/min — ABNORMAL LOW (ref >60)
Glucose, Bld: 202 mg/dL — ABNORMAL HIGH (ref 70–99)
Potassium: 4 mmol/L (ref 3.5–5.1)
Sodium: 145 mmol/L (ref 135–145)
Total Bilirubin: 0.2 mg/dL — ABNORMAL LOW (ref 0.3–1.2)
Total Protein: 6.1 g/dL — ABNORMAL LOW (ref 6.5–8.1)

## 2021-12-02 LAB — RAPID URINE DRUG SCREEN, HOSP PERFORMED
Amphetamines: NOT DETECTED
Barbiturates: NOT DETECTED
Benzodiazepines: POSITIVE — AB
Cocaine: NOT DETECTED
Opiates: NOT DETECTED
Tetrahydrocannabinol: NOT DETECTED

## 2021-12-02 LAB — I-STAT CHEM 8, ED
BUN: 10 mg/dL (ref 6–20)
Calcium, Ion: 1.16 mmol/L (ref 1.15–1.40)
Chloride: 111 mmol/L (ref 98–111)
Creatinine, Ser: 2.2 mg/dL — ABNORMAL HIGH (ref 0.44–1.00)
Glucose, Bld: 194 mg/dL — ABNORMAL HIGH (ref 70–99)
HCT: 31 % — ABNORMAL LOW (ref 36.0–46.0)
Hemoglobin: 10.5 g/dL — ABNORMAL LOW (ref 12.0–15.0)
Potassium: 3.9 mmol/L (ref 3.5–5.1)
Sodium: 145 mmol/L (ref 135–145)
TCO2: 20 mmol/L — ABNORMAL LOW (ref 22–32)

## 2021-12-02 LAB — MAGNESIUM: Magnesium: 2.5 mg/dL — ABNORMAL HIGH (ref 1.7–2.4)

## 2021-12-02 MED ORDER — LACTATED RINGERS IV BOLUS
1000.0000 mL | Freq: Once | INTRAVENOUS | Status: AC
  Administered 2021-12-02: 1000 mL via INTRAVENOUS

## 2021-12-02 MED ORDER — LEVETIRACETAM IN NACL 1500 MG/100ML IV SOLN
1500.0000 mg | Freq: Once | INTRAVENOUS | Status: AC
  Administered 2021-12-02: 1500 mg via INTRAVENOUS
  Filled 2021-12-02: qty 100

## 2021-12-02 MED ORDER — LEVETIRACETAM 500 MG PO TABS: 500.0000 mg | ORAL_TABLET | Freq: Two times a day (BID) | ORAL | 0 refills | Status: DC

## 2021-12-02 NOTE — ED Provider Notes (Signed)
[facility] EMERGENCY DEPARTMENT Provider Note   CSN: [id number] Arrival date & time: 12/02/21  1535     History  Chief Complaint  Patient presents with   Seizures    [name] is a 46 y.o. female.   Seizures  Patient is a 46 year old female with a history of IDDM (under guardianship), SVT, DM2, CKD 3 s/p partial left nephrectomy, HLD, schizophrenia with history of tardive dyskinesea.  Who presents emergency department via EMS for evaluation of seizures.  According to the family today the patient was sitting eating [place] fries and fell to the floor and started having full body shaking.  The shaking at that time lasted approximately 2 minutes.  EMS had been called.  Her glucose was initially 239.  In route patient had a another seizure with EMS.  Lasted approximately 30 seconds and was tonic-clonic in nature.  Patient received 5 mg of Versed in route.  Patient had not had any bowel or bladder incontinence during any of these episodes.  Patient now appears postictal and is somewhat disoriented.  She is GCS 13 on initial presentation.  No identifiable head trauma or other injuries externally.  Per old records in the setting of the patient's underlying schizophrenia she was recently switched from Depakote to risperidone.  They believe that the patient was having emesis as well as tongue twitches and feeling.  Patient does have a previous history of tardive dyskinesia.  Per old records patient had a episode of seizure-like activity on 07/03/2021 and was seen at [facility] emergency department.  She was then seen by [facility] by Dr. April Manson.  Previous EEGs performed on this patient showed mild generalized slowing but no seizures or interictal discharges.  Most recent ABGs were 03/21/2021 and 07/16/2021.  At that time her Depakote was increased to 2000 mg nightly.  At some point between 10/30/2021 and 11/15/2021 the patient's Depakote was discontinued and she was  given risperidone instead.     Home Medications Prior to Admission medications   Medication Sig Start Date End Date Taking? Authorizing Provider  levETIRAcetam (KEPPRA) 500 MG tablet Take 1 tablet (500 mg total) by mouth 2 (two) times daily. 12/02/21 01/01/22 Yes Zyair Rhein, Shanon Brow, MD  acetaminophen (GOODSENSE PAIN RELIEF EXTRA ST) 500 MG tablet Take 1 tablet (500 mg total) by mouth every 8 (eight) hours as needed for mild pain or fever. 09/20/21   Leeanne Rio, MD  aspirin (GOODSENSE ASPIRIN) 81 MG chewable tablet Chew 1 tablet (81 mg total) by mouth daily. 07/25/21   Leeanne Rio, MD  atorvastatin (LIPITOR) 20 MG tablet TAKE ONE TABLET BY MOUTH at bedtime Patient taking differently: Take 20 mg by mouth at bedtime. 11/11/21   Leeanne Rio, MD  Blood Glucose Monitoring Suppl [facility] VERIO) w/Device KIT Check blood sugar once daily 11/26/21   Leeanne Rio, MD  cloZAPine (CLOZARIL) 100 MG tablet Take 3 tablets (300 mg total) by mouth at bedtime for 7 days. 11/17/21   Ezequiel Essex, MD  Emollient (CETAPHIL) cream APPLY DAILY Patient taking differently: 1 application  daily. 09/26/21   Leeanne Rio, MD  famotidine (PEPCID) 20 MG tablet Take 1 tablet (20 mg total) by mouth 2 (two) times daily. 09/05/21   Leeanne Rio, MD  glucose blood (ONETOUCH VERIO) test strip CHECK BLOOD GLUCOSE (SUGAR) EVERY DAY 11/26/21   Leeanne Rio, MD  glycopyrrolate (ROBINUL) 1 MG tablet Take 1 mg by mouth daily.    [provider]  Incontinence Supply Disposable (INCONTINENCE BRIEF MEDIUM) MISC Wear every night 05/27/19   Leeanne Rio, MD  INGREZZA 80 MG capsule Take 80 mg by mouth at bedtime. 09/25/21   [provider]  JARDIANCE 10 MG TABS tablet TAKE ONE TABLET BY MOUTH ONCE DAILY Patient taking differently: Take 10 mg by mouth daily. 09/20/21   Leeanne Rio, MD  Lancet Devices (ONE TOUCH DELICA LANCING DEV) MISC Check blood sugar once  daily 03/27/20   Leeanne Rio, MD  medroxyPROGESTERone (DEPO-PROVERA) 150 MG/ML injection Inject 150 mg into the muscle every 3 (three) months.    [provider]  Multiple Vitamin (MULTIVITAMIN WITH MINERALS) TABS tablet Take 1 tablet by mouth daily. 10/31/21   Precious Gilding, DO  ondansetron (ZOFRAN) 4 MG tablet Take 1 tablet (4 mg total) by mouth every 8 (eight) hours as needed for nausea or vomiting. 11/17/21   Ezequiel Essex, MD  OneTouch Delica Lancets 30Z MISC Check blood sugar once daily 11/26/21   Leeanne Rio, MD  PARoxetine (PAXIL) 10 MG tablet Take 10 mg by mouth daily.    [provider]  polyethylene glycol powder (GLYCOLAX/MIRALAX) 17 GM/SCOOP powder Take 17 g by mouth in the morning and at bedtime. 11/18/21   Leeanne Rio, MD  senna-docusate (SENOKOT-S) 8.6-50 MG tablet Take 1 tablet by mouth at bedtime. 11/18/21   Leeanne Rio, MD  simethicone (GAS-X EXTRA STRENGTH) 125 MG chewable tablet Chew 1 tablet (125 mg total) by mouth daily as needed for flatulence. 09/20/21   Leeanne Rio, MD      Allergies    Minocycline    Review of Systems   Review of Systems  Neurological:  Positive for seizures.    Physical Exam Updated Vital Signs BP 117/81   Pulse 100   Temp 97.8 F (36.6 C) (Oral)   Resp (!) 22   Ht 5' 3"  (1.6 m)   Wt 53.5 kg   SpO2 99%   BMI 20.90 kg/m  Physical Exam Vitals and nursing note reviewed.  Constitutional:      General: She is not in acute distress.    Appearance: She is well-developed.  HENT:     Head: Atraumatic.     Right Ear: External ear normal.     Left Ear: External ear normal.     Nose: Nose normal.     Mouth/Throat:     Mouth: Mucous membranes are moist.     Pharynx: Oropharynx is clear.     Comments: No evidence of lateral tongue biting.  No blood in the oropharynx.  No loose tooth. Eyes:     Extraocular Movements: Extraocular movements intact.     Conjunctiva/sclera: Conjunctivae  normal.     Pupils: Pupils are equal, round, and reactive to light.  Neck:     Comments: Cervical collar in place Cardiovascular:     Rate and Rhythm: Normal rate and regular rhythm.     Heart sounds: No murmur heard. Pulmonary:     Effort: Pulmonary effort is normal. No respiratory distress.     Breath sounds: Normal breath sounds. No wheezing, rhonchi or rales.  Abdominal:     Palpations: Abdomen is soft.     Tenderness: There is no abdominal tenderness.  Musculoskeletal:        General: No swelling.     Cervical back: Neck supple. No tenderness.     Right lower leg: No edema.     Left lower leg: No edema.  Skin:    General: Skin is warm and dry.     Capillary Refill: Capillary refill takes less than 2 seconds.     Findings: No rash.  Neurological:     Mental Status: She is alert. She is disoriented.     GCS: GCS eye subscore is 3. GCS verbal subscore is 4. GCS motor subscore is 6.  Psychiatric:        Mood and Affect: Mood normal.     ED Results / Procedures / Treatments   Labs (all labs ordered are listed, but only abnormal results are displayed) Labs Reviewed  CBC WITH DIFFERENTIAL/PLATELET - Abnormal; Notable for the following components:      Result Value   RBC 2.94 (*)    Hemoglobin 9.5 (*)    HCT 30.9 (*)    MCV 105.1 (*)    All other components within normal limits  COMPREHENSIVE METABOLIC PANEL - Abnormal; Notable for the following components:   CO2 21 (*)    Glucose, Bld 202 (*)    Creatinine, Ser 2.40 (*)    Total Protein 6.1 (*)    Albumin 2.8 (*)    Total Bilirubin 0.2 (*)    GFR, Estimated 24 (*)    All other components within normal limits  URINALYSIS, ROUTINE W REFLEX MICROSCOPIC - Abnormal; Notable for the following components:   Color, Urine STRAW (*)    Glucose, UA >=500 (*)    Leukocytes,Ua TRACE (*)    All other components within normal limits  RAPID URINE DRUG SCREEN, HOSP PERFORMED - Abnormal; Notable for the following components:    Benzodiazepines POSITIVE (*)    All other components within normal limits  MAGNESIUM - Abnormal; Notable for the following components:   Magnesium 2.5 (*)    All other components within normal limits  I-STAT CHEM 8, ED - Abnormal; Notable for the following components:   Creatinine, Ser 2.20 (*)    Glucose, Bld 194 (*)    TCO2 20 (*)    Hemoglobin 10.5 (*)    HCT 31.0 (*)    All other components within normal limits  I-STAT BETA HCG BLOOD, ED ([facility], [facility], [facility] ONLY)    EKG None  Radiology CT Head Wo Contrast  Result Date: 12/02/2021 CLINICAL DATA:  Head trauma, moderate-severe; Neck pain, acute, no red flags EXAM: CT HEAD WITHOUT CONTRAST CT CERVICAL SPINE WITHOUT CONTRAST TECHNIQUE: Multidetector CT imaging of the head and cervical spine was performed following the standard protocol without intravenous contrast. Multiplanar CT image reconstructions of the cervical spine were also generated. RADIATION DOSE REDUCTION: This exam was performed according to the departmental dose-optimization program which includes automated exposure control, adjustment of the mA and/or kV according to patient size and/or use of iterative reconstruction technique. COMPARISON:  MRI head 02/11/1999 report without imaging FINDINGS: CT HEAD FINDINGS Brain: No evidence of large-territorial acute infarction. No parenchymal hemorrhage. No mass lesion. No extra-axial collection. No mass effect or midline shift. No hydrocephalus. Basilar cisterns are patent. Vascular: No hyperdense vessel. Skull: No acute fracture or focal lesion. Hyperostosis frontalis interna. Sinuses/Orbits: Right maxillary mucosal thickening. Paranasal sinuses and mastoid air cells are clear. The orbits are unremarkable. Other: None. CT CERVICAL SPINE FINDINGS Alignment: Normal. Skull base and vertebrae: C2 - C4 facet arthropathy with associated at least moderate to severe osseous neural foraminal stenosis at the left C2- C3 level. No severe osseous central  canal stenosis. No acute fracture. No aggressive appearing focal osseous lesion or focal pathologic  process. Soft tissues and spinal canal: No prevertebral fluid or swelling. No visible canal hematoma. Upper chest: Unremarkable. Other: None. IMPRESSION: 1. No acute intracranial abnormality. 2. No acute displaced fracture or traumatic listhesis of the cervical spine. Electronically Signed   By: Iven Finn M.D.   On: 12/02/2021 18:40   CT Cervical Spine Wo Contrast  Result Date: 12/02/2021 CLINICAL DATA:  Head trauma, moderate-severe; Neck pain, acute, no red flags EXAM: CT HEAD WITHOUT CONTRAST CT CERVICAL SPINE WITHOUT CONTRAST TECHNIQUE: Multidetector CT imaging of the head and cervical spine was performed following the standard protocol without intravenous contrast. Multiplanar CT image reconstructions of the cervical spine were also generated. RADIATION DOSE REDUCTION: This exam was performed according to the departmental dose-optimization program which includes automated exposure control, adjustment of the mA and/or kV according to patient size and/or use of iterative reconstruction technique. COMPARISON:  MRI head 02/11/1999 report without imaging FINDINGS: CT HEAD FINDINGS Brain: No evidence of large-territorial acute infarction. No parenchymal hemorrhage. No mass lesion. No extra-axial collection. No mass effect or midline shift. No hydrocephalus. Basilar cisterns are patent. Vascular: No hyperdense vessel. Skull: No acute fracture or focal lesion. Hyperostosis frontalis interna. Sinuses/Orbits: Right maxillary mucosal thickening. Paranasal sinuses and mastoid air cells are clear. The orbits are unremarkable. Other: None. CT CERVICAL SPINE FINDINGS Alignment: Normal. Skull base and vertebrae: C2 - C4 facet arthropathy with associated at least moderate to severe osseous neural foraminal stenosis at the left C2- C3 level. No severe osseous central canal stenosis. No acute fracture. No aggressive  appearing focal osseous lesion or focal pathologic process. Soft tissues and spinal canal: No prevertebral fluid or swelling. No visible canal hematoma. Upper chest: Unremarkable. Other: None. IMPRESSION: 1. No acute intracranial abnormality. 2. No acute displaced fracture or traumatic listhesis of the cervical spine. Electronically Signed   By: Iven Finn M.D.   On: 12/02/2021 18:40    Procedures Procedures    Medications Ordered in ED Medications  lactated ringers bolus 1,000 mL (0 mLs Intravenous Stopped 12/02/21 2045)  levETIRAcetam (KEPPRA) IVPB 1500 mg/ 100 mL premix (0 mg Intravenous Stopped 12/02/21 2136)    ED Course/ Medical Decision Making/ A&P                           Medical Decision Making Problems Addressed: Seizure [facility]): complicated acute illness or injury with systemic symptoms that poses a threat to life or bodily functions  Amount and/or Complexity of Data Reviewed Independent Historian: guardian and EMS External Data Reviewed: labs, radiology, ECG and notes. Labs: ordered. Radiology: ordered. ECG/medicine tests: ordered.  Risk Prescription drug management.   Patient is a 46 year old female who presents emergency department as above.  On initial presentation patient's vital signs are stable she is afebrile.  Patient appears to be postictal and seems somewhat somnolent on physical exam.  Per EMS patient did have a tonic-clonic episode lasting 30 seconds in addition to the original episode of seizure activity that she had had at home.  Patient arrived in a cervical collar.  EMS had given her 5 of Versed in route.  Suspicion at this time is highest based on the description from EMS and from the family for seizure activity.  Based on record review previous EEGs did not show any evidence of epilepsy and she had previously been on Depakote for nonepilepsy related reasons.  However the Depakote was discontinued on 11/14/2021.  Baseline laboratory work and imaging studies  will  be ordered for this patient.  CMP with evidence of creatinine 2.4, slightly above her baseline, magnesium 2.5, pregnancy test negative.  CBC with slight macrocytic anemia of 9.5 but no evidence of leukocytosis.  UA without signs of infection or blood.  CT scan of the head and neck were both unremarkable for acute findings.  On reevaluation the patient was feeling much improved.  She was back to her baseline according to the family members/caregivers at bedside.  Possibility for seizure being induced by dehydration versus discontinuation of Depakote previously, though felt less likely given the skin discontinuation was on 11/14/2021.  Neurology was consulted for this patient in the setting of her 2 episodes of seizures today with awareness in between the episode and thus not status epilepticus.  They recommended loading her with Keppra and providing her with 500 mg Keppra twice daily until such time that she follows up with neurology.  Patient has previously seen [facility].  Plan and findings were discussed with patient and family at bedside both verbalized understanding were agreeable.  Patient discharged from the ED in stable condition.         Final Clinical Impression(s) / ED Diagnoses Final diagnoses:  Seizure [facility])    Rx / DC Orders ED Discharge Orders          Ordered    levETIRAcetam (KEPPRA) 500 MG tablet  2 times daily        12/02/21 2035              Nelta Numbers, MD 12/02/21 2140    Luna Fuse, MD 12/03/21 2042

## 2021-12-02 NOTE — ED Notes (Signed)
Patient transported to CT 

## 2021-12-02 NOTE — Assessment & Plan Note (Signed)
Presents with generalized seizure-like activity. Follows o/p with [facility]. On Depakote in the past - Admit to [facility], Dr. Dorcas Mcmurray attending - Seizure precautions - Consult to Neurology - EEG -

## 2021-12-02 NOTE — Assessment & Plan Note (Signed)
CBG 194 on admission. Has history of hypoglycemia on prior admissions. On Jardiance, Trajenta and Glipizde. - CBG AC and qhs - sSSI - Hold home Jardiance, Trajenta, Glipizide

## 2021-12-02 NOTE — ED Notes (Signed)
IV team at bedside 

## 2021-12-02 NOTE — ED Triage Notes (Signed)
Pt BIB EMS due to seizure. Pts sitter from home states she was eating fries and fell to floor and had seizure that lasted 2 min. EMS states she had another seizure in route that lasted 30 seconds. Pt received 5 of Versed. Pt has behavioral issues and diabetes. Pt does not have hx of seizure. Pt post ictal on arrival.

## 2021-12-02 NOTE — Assessment & Plan Note (Addendum)
Also has history of tardive dyskinesia.Takes Clozapine, Ingrezza,  Quetiapine, Paxil. - Continue home meds

## 2021-12-02 NOTE — Telephone Encounter (Signed)
Noted, I see that patient is at [facility]. If she requires admission she can come to our [facility] team; I am inpatient attending this week.  Leeanne Rio, MD

## 2021-12-02 NOTE — Telephone Encounter (Signed)
Patients caregiver calls nurse line reporting patient had a seizure and she is being transported to the hospital via EMS.   Will forward to PCP.

## 2021-12-02 NOTE — Discharge Instructions (Addendum)
You were seen in the emergency department for evaluation of 2 episodes of suspected seizures.  Your lab work was reassuring and you had a return to baseline while in the emergency department.  CT scans performed of your head and neck were negative for acute findings.  Your lab work was reassuring.  After speaking with neurology we recommended giving a dose of Keppra here in the emergency department and prescribing Keppra 500 mg twice a day until such time that you follow-up with [facility].  Please also follow-up with your primary care provider in the coming days for symptom recheck.

## 2021-12-02 NOTE — ED Notes (Signed)
Pt hard stick. RN reached out to phlebotomy.

## 2021-12-05 ENCOUNTER — Ambulatory Visit (INDEPENDENT_AMBULATORY_CARE_PROVIDER_SITE_OTHER): Payer: Medicare Other | Admitting: Neurology

## 2021-12-05 ENCOUNTER — Encounter: Payer: Self-pay | Admitting: Neurology

## 2021-12-05 VITALS — BP 121/84 | HR 121 | Ht 63.0 in | Wt 111.0 lb

## 2021-12-05 DIAGNOSIS — G40909 Epilepsy, unspecified, not intractable, without status epilepticus: Secondary | ICD-10-CM | POA: Diagnosis not present

## 2021-12-05 DIAGNOSIS — F209 Schizophrenia, unspecified: Secondary | ICD-10-CM

## 2021-12-05 MED ORDER — OXCARBAZEPINE 300 MG PO TABS: 300.0000 mg | ORAL_TABLET | Freq: Two times a day (BID) | ORAL | 6 refills | Status: DC

## 2021-12-05 NOTE — Patient Instructions (Addendum)
Start with Trileptal 300 mg BID  Will contact patient psychiatrist to inquire about restarting Depakote  Continue your current medications  Follow up as schedule in 3 months or sooner if worse

## 2021-12-05 NOTE — Progress Notes (Signed)
[facility]  PATIENT: [name] DOB: 1976-02-22  REQUESTING CLINICIAN: Leeanne Rio, MD HISTORY FROM: Patient and Aid [name]  REASON FOR VISIT: Seizure like activity    HISTORICAL  CHIEF COMPLAINT:  Chief Complaint  Patient presents with   Follow-up    Rm 13. Accompanied by [name] (therapeutic provider). ED f/u for seizure.   INTERVAL HISTORY 12/05/2021: [name] presents today for follow-up, she is accompanied by her new therapeutic provider [name].  At last visit in May plan was to continue with Depakote 2000 mg nightly.  [name] reported that patient medications, notably the Seroquel and the Depakote was discontinued on July 6 due to possible movement disorder, and patient was started on Ingrezza injection.  On July 24 she was witnessed to have 2 generalized tonic-clonic seizures. She was taken to the ED, patient was started on Keppra 500 mg twice daily.  Caregiver also reports that prior to discontinuation of Depakote, patient had stopped taking it stating that the tablets are too large to swallow.  Caregiver reports that since starting [place] patient has been having excessive drooling, she is very loopy, she sleeps all day and she has noted that patient is noncompliant and very defiant.  Her behavior got worse, she is having BM at night on her bed and smear the feces on the wall.  She also had lost weight.   INTERVAL HISTORY 10/02/2021:  Patient presents today for follow-up, she is accompanied by her caregiver [name].  Last visit was in February 28, since last visit no additional seizure.  Patient reports that sometimes she can feel the seizures coming then she starts shaking. Caregiver reports during this time patient is alert and responsive.  No full tonic-clonic activity noted.  She is compliant with the Depakote 2000 mg at nighttime.  Caregiver also reported she has used Valtoco once due to ongoing shaking.    INTERVAL HISTORY 07/09/2021:  Patient  presents today for follow-up, she is accompanied by her caregiver.  She presented to the ED on February 22 for another seizure.  Per caregiver who witnessed the event, patient extended arm and then fell backward she was noted to shake, foaming at the mouth, no urinary incontinence.  Episode lasted about 1 to 2-minutes with postictal confusion.  EMS was called and she presented to the ED.  In the ED her Depakote level was normal at 66. She was back to her normal self, discharge without changes to her ASM.    HISTORY OF PRESENT ILLNESS:  This is a 46 year old woman with past medical history of schizophrenia, cognitive impairment, and diabetes mellitus who is presenting after being admitted to the hospital for seizure-like activity.  Patient was initially admitted from November 9 to November 14 after being witnessed to have seizure-like activity at her nursing home.  EMS was called and she was given 5 mg of Versed.  She was taken to the ED, EEG was negative for seizures but showed mild generalized slowing.  At that time she was also noted to be COVID-positive.  No other abnormal movement noted.  Her Depakote was increased to 1250.  Patient was discharged home then presented again to the ED on November 21 for seizure-like activity, again it was described as she was shaking, unresponsive, EMS was called and again she was given Versed 5 mg and sent to the ED.  In the ED, her Depakote level was checked and was therapeutic and she was discharged home without any increase in her antiseizure medication.  Since being back to the nursing home, aide reports that she does have occasional seizure-like activity, she provided a video when you see patient shaking her head and was able to stop it upon command.  Denies any urinary incontinence no injuries, no tongue biting.   Handedness: Right   Seizure Type: Described as shaking   Current frequency: weekly   Any injuries from seizures: None   Seizure risk factors:  Schizophrenia, otherwise none reported   Previous ASMs: Depakote but for mood, Keppra (worsening behavior)    Currenty ASMs: Trileptal   ASMs side effects: None   Brain Images: within normal limits   Previous EEGs: Mild generalized slowing, no seizures, no interictal discharges   OTHER MEDICAL CONDITIONS: Schizophrenia, cognitive impairment, HTN, DMII, HLD  REVIEW OF SYSTEMS: Full 14 system review of systems performed and negative with exception of: unable to obtain   ALLERGIES: Allergies  Allergen Reactions   Minocycline     Unknown     HOME MEDICATIONS: Outpatient Medications Prior to Visit  Medication Sig Dispense Refill   acetaminophen (TYLENOL) 500 MG tablet Take 500 mg by mouth every 6 (six) hours as needed.     aspirin (GOODSENSE ASPIRIN) 81 MG chewable tablet Chew 1 tablet (81 mg total) by mouth daily. 90 tablet 3   atorvastatin (LIPITOR) 20 MG tablet TAKE ONE TABLET BY MOUTH at bedtime (Patient taking differently: Take 20 mg by mouth at bedtime.) 90 tablet 0   BENZTROPINE MESYLATE PO Take by mouth daily.     cloZAPine (CLOZARIL) 100 MG tablet Take 100 mg by mouth daily. Take 3 tablets PO at bedtime for 7 days. (Titrating down by PCP.)     Emollient (CETAPHIL) cream APPLY DAILY (Patient taking differently: 1 application  daily.) 081 g 1   famotidine (PEPCID) 20 MG tablet Take 1 tablet (20 mg total) by mouth 2 (two) times daily. 180 tablet 1   glipiZIDE (GLUCOTROL) 5 MG tablet Take 2.5 mg by mouth daily before breakfast.     glycopyrrolate (ROBINUL) 1 MG tablet Take 1 mg by mouth daily.     INGREZZA 80 MG capsule Take 80 mg by mouth at bedtime.     JARDIANCE 10 MG TABS tablet TAKE ONE TABLET BY MOUTH ONCE DAILY (Patient taking differently: Take 10 mg by mouth daily.) 90 tablet 1   medroxyPROGESTERone (DEPO-PROVERA) 150 MG/ML injection Inject 150 mg into the muscle every 3 (three) months.     PARoxetine (PAXIL) 10 MG tablet Take 10 mg by mouth daily.     polyethylene  glycol powder (GLYCOLAX/MIRALAX) 17 GM/SCOOP powder Take 17 g by mouth in the morning and at bedtime. 510 g 3   senna-docusate (SENOKOT-S) 8.6-50 MG tablet Take 1 tablet by mouth at bedtime. 30 tablet 0   simethicone (GAS-X EXTRA STRENGTH) 125 MG chewable tablet Chew 1 tablet (125 mg total) by mouth daily as needed for flatulence. 30 tablet 4   levETIRAcetam (KEPPRA) 500 MG tablet Take 1 tablet (500 mg total) by mouth 2 (two) times daily. 60 tablet 0   acetaminophen (GOODSENSE PAIN RELIEF EXTRA ST) 500 MG tablet Take 1 tablet (500 mg total) by mouth every 8 (eight) hours as needed for mild pain or fever. 30 tablet 3   Blood Glucose Monitoring Suppl (ONETOUCH VERIO) w/Device KIT Check blood sugar once daily 1 kit 0   cloZAPine (CLOZARIL) 100 MG tablet Take 3 tablets (300 mg total) by mouth at bedtime for 7 days.     glucose blood (  ONETOUCH VERIO) test strip CHECK BLOOD GLUCOSE (SUGAR) EVERY DAY 100 each 3   Incontinence Supply Disposable (INCONTINENCE BRIEF MEDIUM) MISC Wear every night 60 each 5   Lancet Devices (ONE TOUCH DELICA LANCING DEV) MISC Check blood sugar once daily 1 each 0   Multiple Vitamin (MULTIVITAMIN WITH MINERALS) TABS tablet Take 1 tablet by mouth daily. 30 tablet 0   ondansetron (ZOFRAN) 4 MG tablet Take 1 tablet (4 mg total) by mouth every 8 (eight) hours as needed for nausea or vomiting. 20 tablet 0   OneTouch Delica Lancets 56Y MISC Check blood sugar once daily 100 each 3   No facility-administered medications prior to visit.    PAST MEDICAL HISTORY: Past Medical History:  Diagnosis Date   Acne    Anemia    Cognitive impairment    Diabetes mellitus without complication (HCC)    Internal hemorrhoid, bleeding 10/22/2010   Obesity    Schizoaffective disorder    SVT (supraventricular tachycardia) (HCC)    with short PR interval - followed by Dr. Lovena Le    PAST SURGICAL HISTORY: Past Surgical History:  Procedure Laterality Date   BREAST REDUCTION SURGERY  08/10/2005    HYSTEROSCOPY  09/09/2004   w/ removal of polyps   KIDNEY SURGERY Left 05/17/2020   Tumor removal by Dr. Dema Severin   REDUCTION MAMMAPLASTY Bilateral 2007   SPINE SURGERY N/A 04/18/2020   Dr. Arnoldo Morale - pinched nerve of Lumbar Spine    FAMILY HISTORY: Family History  Problem Relation Age of Onset   Schizophrenia Sister     SOCIAL HISTORY: Social History   Socioeconomic History   Marital status: Single    Spouse name: Not on file   Number of children: Not on file   Years of education: 10 years   Highest education level: 10th grade  Occupational History   Occupation: Disabled  Tobacco Use   Smoking status: Never   Smokeless tobacco: Never  Vaping Use   Vaping Use: Never used  Substance and Sexual Activity   Alcohol use: No   Drug use: No   Sexual activity: Never    Birth control/protection: Injection  Other Topics Concern   Not on file  Social History Narrative   Caregiver is Tax adviser. Lives in [facility] in [place], [phone]. Previously lived in group home. Twin sister with similar problem list. Has 24H care. Case manager is Justin Mend, [phone].      Patient recently started going to [employer]. Phone: [phone]. [address] [place] [address]      Caregiver works for [employer], [address] [place], [place] [address].       Guardian is Delaney Meigs of [employer] [place] [phone] office. After hours service [phone]   Social Determinants of Health   Financial Resource Strain: Low Risk  (12/23/2020)   Overall Financial Resource Strain (CARDIA)    Difficulty of Paying Living Expenses: Not hard at all  Food Insecurity: No Food Insecurity (12/23/2020)   Hunger Vital Sign    Worried About Running Out of Food in the Last Year: Never true    Ran Out of Food in the Last Year: Never true  Transportation Needs: No Transportation Needs (12/23/2020)   PRAPARE - Radiographer, therapeutic (Medical): No    Lack of Transportation (Non-Medical): No  Physical Activity: Insufficiently Active (12/23/2020)   Exercise Vital Sign    Days of Exercise per Week: 5 days  Minutes of Exercise per Session: 10 min  Stress: Stress Concern Present (12/23/2020)   [place]    Feeling of Stress : To some extent  Social Connections: Socially Isolated (12/23/2020)   Social Connection and Isolation Panel [NHANES]    Frequency of Communication with Friends and Family: Once a week    Frequency of Social Gatherings with Friends and Family: Once a week    Attends Religious Services: 1 to 4 times per year    Active Member of [employer] or Organizations: No    Attends Archivist Meetings: Never    Marital Status: Never married  Intimate Partner Violence: Not At Risk (12/23/2020)   Humiliation, Afraid, Rape, and Kick questionnaire    Fear of Current or Ex-Partner: No    Emotionally Abused: No    Physically Abused: No    Sexually Abused: No     PHYSICAL EXAM  GENERAL EXAM/CONSTITUTIONAL: Vitals:  Vitals:   12/05/21 1119  BP: 121/84  Pulse: (!) 121  Weight: 111 lb (50.3 kg)  Height: 5' 3"  (1.6 m)   Body mass index is 19.66 kg/m. Wt Readings from Last 3 Encounters:  12/05/21 111 lb (50.3 kg)  12/02/21 118 lb (53.5 kg)  11/18/21 118 lb 3.2 oz (53.6 kg)   Patient is in no distress; well developed, nourished and groomed; neck is supple  EYES: Pupils round and reactive to light, Visual fields full to confrontation, Extraocular movements intacts,  No results found.  MUSCULOSKELETAL: Gait, strength, tone, movements noted in Neurologic exam below  NEUROLOGIC: MENTAL STATUS:      No data to display         awake, alert, oriented to person, place and time Able to follow simple commands  Sleepy during visit, drooling  CRANIAL NERVE:  2nd, 3rd, 4th, 6th - pupils equal and reactive to light,  visual fields full to confrontation, extraocular muscles intact, no nystagmus 5th - facial sensation symmetric 7th - facial strength symmetric 8th - hearing intact 9th - palate elevates symmetrically, uvula midline 11th - shoulder shrug symmetric 12th - tongue protrusion midline  MOTOR:  normal bulk and tone, full strength in the BUE, BLE  REFLEXES:  deep tendon reflexes present and symmetric  GAIT/STATION:  Wide based, short stride, mildly unsteady     DIAGNOSTIC DATA (LABS, IMAGING, TESTING) - I reviewed patient records, labs, notes, testing and imaging myself where available.  Lab Results  Component Value Date   WBC 7.1 12/02/2021   HGB 10.5 (L) 12/02/2021   HCT 31.0 (L) 12/02/2021   MCV 105.1 (H) 12/02/2021   PLT 224 12/02/2021      Component Value Date/Time   NA 145 12/02/2021 1620   NA 150 (H) 11/18/2021 1439   K 3.9 12/02/2021 1620   CL 111 12/02/2021 1620   CO2 21 (L) 12/02/2021 1612   GLUCOSE 194 (H) 12/02/2021 1620   BUN 10 12/02/2021 1620   BUN 18 11/18/2021 1439   CREATININE 2.20 (H) 12/02/2021 1620   CREATININE 1.79 (H) 06/23/2016 1025   CALCIUM 9.1 12/02/2021 1612   PROT 6.1 (L) 12/02/2021 1612   PROT 6.6 02/28/2020 1207   ALBUMIN 2.8 (L) 12/02/2021 1612   ALBUMIN 4.0 02/28/2020 1207   AST 28 12/02/2021 1612   ALT 18 12/02/2021 1612   ALKPHOS 92 12/02/2021 1612   BILITOT 0.2 (L) 12/02/2021 1612   BILITOT 0.3 02/28/2020 1207   GFRNONAA 24 (L) 12/02/2021 1612  GFRNONAA 34 (L) 06/23/2016 1025   GFRAA 47 (L) 02/28/2020 1207   GFRAA 40 (L) 06/23/2016 1025   Lab Results  Component Value Date   CHOL 63 (L) 11/18/2021   HDL 32 (L) 11/18/2021   LDLCALC 16 11/18/2021   LDLDIRECT 60 04/02/2012   TRIG 67 11/18/2021   Lab Results  Component Value Date   HGBA1C 6.3 (H) 11/15/2021   Lab Results  Component Value Date   [id number] 283 11/16/2021   Lab Results  Component Value Date   TSH 1.049 11/15/2021    Depakote level 11/21:  73  Depakote level 2/22: 66   MRI Brain with and without contrast 03/20/2021 No acute intracranial process.  No evidence of brain mass or lesion   EEG 03/21/2021: This study is suggestive of mild diffuse encephalopathy, nonspecific etiology. No seizures or epileptiform discharges were seen throughout the recording.   Routine EEG 07/16/21:  This is an abnormal EEG recording in the waking and drowsy state due to diffuse slowing. Diffuse slowing is consistent with a generalized brain dysfunction such as in encephalopathy.    ASSESSMENT AND PLAN  46 y.o. year old female  with past medical history of schizophrenia, cognitive impairment, hypertension, hyperlipidemia, and diabetes mellitus who is presenting for seizure follow up. Had 2 breakthrough seizures since Depakote discontinued, while on Keppra, had side effects of worsening behavior, non compliant, defiant. Will contact her current psychiatrist to inquire if there are any contra indication to restart  Depakote (discontinue by psychiatrist). In the meanwhile, will start her on Trileptal 300 mg BID.     1. Seizure disorder ([place])   2. Schizophrenia, unspecified type ([place])      Patient Instructions  Start with Trileptal 300 mg BID  Will contact patient psychiatrist to inquire about restarting Depakote  Continue your current medications  Follow up as schedule in 3 months or sooner if worse    Per [facility] statutes, patients with seizures are not allowed to drive until they have been seizure-free for six months.  Other recommendations include using caution when using heavy equipment or power tools. Avoid working on ladders or at heights. Take showers instead of baths.  Do not swim alone.  Ensure the water temperature is not too high on the home water heater. Do not go swimming alone. Do not lock yourself in a room alone (i.e. bathroom). When caring for infants or small children, sit down when holding, feeding, or changing them to  minimize risk of injury to the child in the event you have a seizure. Maintain good sleep hygiene. Avoid alcohol.  Also recommend adequate sleep, hydration, good diet and minimize stress.   During the Seizure  - First, ensure adequate ventilation and place patients on the floor on their left side  Loosen clothing around the neck and ensure the airway is patent. If the patient is clenching the teeth, do not force the mouth open with any object as this can cause severe damage - Remove all items from the surrounding that can be hazardous. The patient may be oblivious to what's happening and may not even know what he or she is doing. If the patient is confused and wandering, either gently guide him/her away and block access to outside areas - Reassure the individual and be comforting - Call 911. In most cases, the seizure ends before EMS arrives. However, there are cases when seizures may last over 3 to 5 minutes. Or the individual may have developed breathing difficulties  or severe injuries. If a pregnant patient or a person with diabetes develops a seizure, it is prudent to call an ambulance. - Finally, if the patient does not regain full consciousness, then call EMS. Most patients will remain confused for about 45 to 90 minutes after a seizure, so you must use judgment in calling for help. - Avoid restraints but make sure the patient is in a bed with padded side rails - Place the individual in a lateral position with the neck slightly flexed; this will help the saliva drain from the mouth and prevent the tongue from falling backward - Remove all nearby furniture and other hazards from the area - Provide verbal assurance as the individual is regaining consciousness - Provide the patient with privacy if possible - Call for help and start treatment as ordered by the caregiver   After the Seizure (Postictal Stage)  After a seizure, most patients experience confusion, fatigue, muscle pain and/or a  headache. Thus, one should permit the individual to sleep. For the next few days, reassurance is essential. Being calm and helping reorient the person is also of importance.  Most seizures are painless and end spontaneously. Seizures are not harmful to others but can lead to complications such as stress on the lungs, brain and the heart. Individuals with prior lung problems may develop labored breathing and respiratory distress.     No orders of the defined types were placed in this encounter.   Meds ordered this encounter  Medications   Oxcarbazepine (TRILEPTAL) 300 MG tablet    Sig: Take 1 tablet (300 mg total) by mouth 2 (two) times daily.    Dispense:  60 tablet    Refill:  6    No follow-ups on file.  I have spent a total of 30 minutes dedicated to this patient today, preparing to see patient, performing a medically appropriate examination and evaluation, ordering tests and/or medications and procedures, and counseling and educating the patient/family/caregiver; independently interpreting result and communicating results to the family/patient/caregiver; and documenting clinical information in the electronic medical record.    Alric Ran, MD 12/05/2021, 5:00 PM  [facility] [address] [place], [place] [address] [phone]

## 2021-12-10 ENCOUNTER — Other Ambulatory Visit: Payer: Self-pay

## 2021-12-10 ENCOUNTER — Ambulatory Visit (INDEPENDENT_AMBULATORY_CARE_PROVIDER_SITE_OTHER): Payer: Medicare Other | Admitting: Family Medicine

## 2021-12-10 ENCOUNTER — Encounter: Payer: Self-pay | Admitting: Family Medicine

## 2021-12-10 DIAGNOSIS — N1831 Chronic kidney disease, stage 3a: Secondary | ICD-10-CM

## 2021-12-10 DIAGNOSIS — R634 Abnormal weight loss: Secondary | ICD-10-CM | POA: Diagnosis not present

## 2021-12-10 DIAGNOSIS — E1122 Type 2 diabetes mellitus with diabetic chronic kidney disease: Secondary | ICD-10-CM

## 2021-12-10 DIAGNOSIS — N179 Acute kidney failure, unspecified: Secondary | ICD-10-CM

## 2021-12-10 DIAGNOSIS — K5909 Other constipation: Secondary | ICD-10-CM

## 2021-12-10 MED ORDER — ONETOUCH VERIO VI STRP: ORAL_STRIP | 3 refills | Status: DC

## 2021-12-10 NOTE — Assessment & Plan Note (Signed)
Now with diarrhea.  Likely with change in her psychiatric medications does not need regular miralax and senna.  Will stop these and monitor.  If not improving might need to check for infection or cdiff

## 2021-12-10 NOTE — Assessment & Plan Note (Signed)
Controlled by report on Jardiance - continue this

## 2021-12-10 NOTE — Assessment & Plan Note (Signed)
Creatinine improved - hard to tell what is baseline.  Hopefully if can decrease diarrhea will improve hydration.  Continue to follow

## 2021-12-10 NOTE — Assessment & Plan Note (Signed)
Almost 10 lbs in last few weeks.  Most likely due to diarrhea.  Stop constipation medications and see if resolves.  Seems to have chronic issues with food intake

## 2021-12-10 NOTE — Progress Notes (Signed)
    SUBJECTIVE:   CHIEF COMPLAINT / HPI:   She is accompanied by her health coordinator who brings in a current copy of all her medications and seems very knowledgeable about her conditions and appts -   Diarrhea Having several episodes each day especially at night.  Feels is related to her miralax and senna that was being used for a now discontinued psychiatric medication.  No fevers or vomiting or fevers .  Is a picky eater mostly likes junk food.   Diabetes Only taking jardiance 10 mg daily.  Her AM blood sugar are usually in low 100s.  No low blood sugar   Renal Insuff  Her most recent below showed improved creatinine     PERTINENT  PMH / PSH:  Had Neurology visit on 7/27 Started Trileptal 300 mg BID  and has psychiatry visit later this week. Are decreasing ingrezza to decrease tachycardia  7/24 Crt 2.2 in ER A1c 6.3 on 7/7  OBJECTIVE:   BP (!) 138/95   Pulse (!) 133   Wt 108 lb 9.6 oz (49.3 kg)   SpO2 100%   BMI 19.24 kg/m   Alert intermittently responds to questions Heart - Regular rate and rhythm.  No murmurs, gallops or rubs.    Lungs:  Normal respiratory effort, chest expands symmetrically. Lungs are clear to auscultation, no crackles or wheezes. Does not appear dehydrated    ASSESSMENT/PLAN:   Type 2 diabetes mellitus with renal manifestations ([place]) Controlled by report on Jardiance - continue this   AKI (acute kidney injury) ([place]) Creatinine improved - hard to tell what is baseline.  Hopefully if can decrease diarrhea will improve hydration.  Continue to follow   CONSTIPATION, CHRONIC Now with diarrhea.  Likely with change in her psychiatric medications does not need regular miralax and senna.  Will stop these and monitor.  If not improving might need to check for infection or cdiff   Weight loss Almost 10 lbs in last few weeks.  Most likely due to diarrhea.  Stop constipation medications and see if resolves.  Seems to have chronic issues with food intake    Patient has referral in to Endocrinology - gave number for [facility] to her care giver to call for an appointment    Patient Instructions  Good to see you today - Thank you for coming in  Things we discussed today:   Weight Loss - will stop the miralax and senna and see how she does  Diarrhea - see if resolves with stopping laxative - May need to restart at lower dose later  I will check on the Endocrine referral  Keep appointment as scheduled.  Come back sooner if not improving      Lind Covert, MD [place]

## 2021-12-10 NOTE — Patient Instructions (Signed)
Good to see you today - Thank you for coming in  Things we discussed today:   Weight Loss - will stop the miralax and senna and see how she does  Diarrhea - see if resolves with stopping laxative - May need to restart at lower dose later  I will check on the Endocrine referral  Keep appointment as scheduled.  Come back sooner if not improving

## 2021-12-11 DIAGNOSIS — F250 Schizoaffective disorder, bipolar type: Secondary | ICD-10-CM | POA: Diagnosis not present

## 2021-12-11 DIAGNOSIS — F70 Mild intellectual disabilities: Secondary | ICD-10-CM | POA: Diagnosis not present

## 2021-12-13 ENCOUNTER — Telehealth: Payer: Self-pay | Admitting: Family Medicine

## 2021-12-13 DIAGNOSIS — F250 Schizoaffective disorder, bipolar type: Secondary | ICD-10-CM | POA: Diagnosis not present

## 2021-12-13 DIAGNOSIS — F70 Mild intellectual disabilities: Secondary | ICD-10-CM | POA: Diagnosis not present

## 2021-12-13 NOTE — Telephone Encounter (Signed)
Caregiver calls nurse line in regards to Endocrine referral.   She reports [facility] is so overwhelmed they are scheduling into next year, March or April.  She reports [facility] is scheduling for September this year.  She requests we send referral there.   Will forward to [facility].

## 2021-12-13 NOTE — Telephone Encounter (Signed)
Gave number for Endocrine as listed in the referral order to call for an appointment

## 2021-12-16 ENCOUNTER — Ambulatory Visit: Payer: Medicare Other | Admitting: Family Medicine

## 2021-12-16 NOTE — Telephone Encounter (Signed)
Referral faxed to [facility].  Jordis Repetto,CMA

## 2021-12-17 ENCOUNTER — Other Ambulatory Visit: Payer: Self-pay | Admitting: *Deleted

## 2021-12-17 DIAGNOSIS — N183 Chronic kidney disease, stage 3 unspecified: Secondary | ICD-10-CM

## 2021-12-17 MED ORDER — ONETOUCH VERIO VI STRP: ORAL_STRIP | 3 refills | Status: DC

## 2021-12-17 MED ORDER — ONETOUCH ULTRASOFT LANCETS MISC: 12 refills | Status: DC

## 2021-12-17 NOTE — Telephone Encounter (Signed)
Spoke with caregiver to update on referral and she mentioned that test strips were not covered due to them not being associated with a diabetes code.  Resent in test strips along with lancets and linked them to diabetes ICD10 code in chart.  Kerwin Augustus,CMA

## 2021-12-24 ENCOUNTER — Telehealth: Payer: Self-pay

## 2021-12-24 NOTE — Telephone Encounter (Signed)
Called care giver and she verifies that request is legitimate for the supplies because of patient's ongoing bed wetting.  Ozella Almond, [place]

## 2021-12-24 NOTE — Telephone Encounter (Signed)
-----   Message from Leeanne Rio, MD sent at 12/20/2021  2:43 PM EDT ----- I received a request for incontinence supplies from [facility]. Can you call patient's caregiver to confirm whether they initiated this request? Sometimes requests like this are fraudulent so I want to be sure before I sign it  Thank you! Leeanne Rio, MD

## 2021-12-25 NOTE — Telephone Encounter (Signed)
Form completed and placed in fax pile Leeanne Rio, MD

## 2022-01-06 ENCOUNTER — Ambulatory Visit (INDEPENDENT_AMBULATORY_CARE_PROVIDER_SITE_OTHER): Payer: Medicare Other | Admitting: Family Medicine

## 2022-01-06 ENCOUNTER — Encounter: Payer: Self-pay | Admitting: Family Medicine

## 2022-01-06 VITALS — BP 124/70 | HR 110 | Temp 97.6°F | Ht 63.0 in | Wt 124.4 lb

## 2022-01-06 DIAGNOSIS — K5909 Other constipation: Secondary | ICD-10-CM | POA: Diagnosis not present

## 2022-01-06 DIAGNOSIS — Z3042 Encounter for surveillance of injectable contraceptive: Secondary | ICD-10-CM

## 2022-01-06 DIAGNOSIS — E1122 Type 2 diabetes mellitus with diabetic chronic kidney disease: Secondary | ICD-10-CM

## 2022-01-06 DIAGNOSIS — K5900 Constipation, unspecified: Secondary | ICD-10-CM

## 2022-01-06 DIAGNOSIS — N183 Chronic kidney disease, stage 3 unspecified: Secondary | ICD-10-CM | POA: Diagnosis not present

## 2022-01-06 DIAGNOSIS — F209 Schizophrenia, unspecified: Secondary | ICD-10-CM

## 2022-01-06 DIAGNOSIS — Z23 Encounter for immunization: Secondary | ICD-10-CM

## 2022-01-06 MED ORDER — MEDROXYPROGESTERONE ACETATE 150 MG/ML IM SUSY
150.0000 mg | PREFILLED_SYRINGE | Freq: Once | INTRAMUSCULAR | Status: AC
  Administered 2022-01-06: 150 mg via INTRAMUSCULAR

## 2022-01-06 MED ORDER — POLYETHYLENE GLYCOL 3350 17 G PO PACK: 17.0000 g | PACK | Freq: Every day | ORAL | 3 refills | Status: DC | PRN

## 2022-01-06 MED ORDER — ONETOUCH ULTRASOFT LANCETS MISC: 12 refills | Status: DC

## 2022-01-06 NOTE — Patient Instructions (Signed)
It was great to see you again today!  Depo today Tdap vaccine today  Go back to miralax 17g daily as needed for constipation - goal is bowel movement at least twice a week  Follow up in mid-October for next A1c  Be well, [name]

## 2022-01-06 NOTE — Assessment & Plan Note (Signed)
Depo given today.

## 2022-01-06 NOTE — Progress Notes (Signed)
  Date of Visit: 01/06/2022   SUBJECTIVE:   HPI:  Georjean presents today for routine follow up.   Diabetes - taking jardiance '10mg'$  daily. Tolerating well. Fasting sugars around 90s-100s. Has had increased appetite since last visit.  Diarrhea: Last visit saw Dr. Erin Hearing, was instructed to stop senna and MiraLAX due to diarrhea.  She is now having bowel movement only once a week.  Appetite is significantly increased.  She has also gained significant weight since last visit.  No difficulty breathing.  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  See medication list for full details.  They are titrating down her clozapine.  Overall caregiver feels like she is doing much better with her current regimen.  OBJECTIVE:   BP 124/70   Pulse (!) 110   Temp 97.6 F (36.4 C)   Ht '5\' 3"'$  (1.6 m)   Wt 124 lb 6.4 oz (56.4 kg)   SpO2 99%   BMI 22.04 kg/m  Gen: no acute distress, pleasant, cooperative HEENT: normocephalic, atraumatic  Heart: regular rate and rhythm, no murmur Lungs: clear to auscultation bilaterally, normal work of breathing  Neuro: alert speech normal grossly nonfocal Abdomen: soft nontender to palpation, mildly full but no masses Ext: No appreciable lower extremity edema bilaterally   ASSESSMENT/PLAN:   Health maintenance:  -Tdap given today as last booster >10 years ago -We will request records from last eye exam at [facility] July 30, 2021 -Return for flu shot when available  CONSTIPATION, CHRONIC Seems to have return of constipation after stopping senna and MiraLAX.  We will add back MiraLAX 17 g daily as needed.  Discussed titrating with goal of bowel movement for least twice a week.  Type 2 diabetes mellitus with renal manifestations (HCC) Controlled by fasting sugars.  Follow-up in mid October for next A1c.  Continue  Jardiance 10 mg daily.  Schizophrenia [facility]) Psychiatry NP continues to adjust medications.  Overall seems to be doing pretty well right now.  Contraception management Depo given today.  FOLLOW UP: Follow up in mid October for diabetes  [name], [place]

## 2022-01-06 NOTE — Assessment & Plan Note (Signed)
Seems to have return of constipation after stopping senna and MiraLAX.  We will add back MiraLAX 17 g daily as needed.  Discussed titrating with goal of bowel movement for least twice a week.

## 2022-01-06 NOTE — Progress Notes (Signed)
Patients presents for Depo injection and is within her dates.  Injection given in [facility].  Patient tolerated it well and site remarkable.  Reminder card with next injection given to [facility] (caregiver).  Ozella Almond, [place]

## 2022-01-06 NOTE — Assessment & Plan Note (Signed)
Psychiatry NP continues to adjust medications.  Overall seems to be doing pretty well right now.

## 2022-01-06 NOTE — Assessment & Plan Note (Signed)
Controlled by fasting sugars.  Follow-up in mid October for next A1c.  Continue Jardiance 10 mg daily.

## 2022-01-08 ENCOUNTER — Telehealth: Payer: Self-pay

## 2022-01-08 MED ORDER — POLYETHYLENE GLYCOL 3350 17 GM/SCOOP PO POWD: 17.0000 g | Freq: Every day | ORAL | 3 refills | Status: DC | PRN

## 2022-01-08 NOTE — Telephone Encounter (Signed)
Rx sent Beyla Loney J Gudelia Eugene, MD  

## 2022-01-08 NOTE — Telephone Encounter (Signed)
Received phone call from pharmacist regarding Miralax prescription. Insurance does not cover miralax packets. She is requesting that new prescription be sent for Miralax 17 gram powder with dispense quantity of 510 grams.   If appropriate, please send new prescription to [facility].   Talbot Grumbling, RN

## 2022-01-14 DIAGNOSIS — F70 Mild intellectual disabilities: Secondary | ICD-10-CM | POA: Diagnosis not present

## 2022-01-14 DIAGNOSIS — F250 Schizoaffective disorder, bipolar type: Secondary | ICD-10-CM | POA: Diagnosis not present

## 2022-01-23 LAB — HM DIABETES EYE EXAM

## 2022-01-23 IMAGING — MR MR HEAD WO/W CM
10 of 14 series · 33 of 48 positions shown · IV contrast (gadavist)
Comparison: No prior MRI available, correlation is made with same
day CT head.

CLINICAL DATA: Brain mass or lesion

EXAM:
MRI HEAD WITHOUT AND WITH CONTRAST
TECHNIQUE: Multiplanar, multiecho pulse sequences of the brain and surrounding
structures were obtained without and with intravenous contrast.
CONTRAST:  6mL GADAVIST GADOBUTROL 1 MMOL/ML IV SOLN

[Series 3: DWI · axial · 3.0mm · 1.09mm/px · z∈[-56,+87]mm · 7 of 100 slices shown (1 of 4)]
[im 1/100]
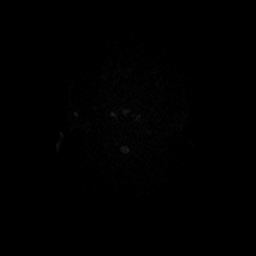
[im 17/100]
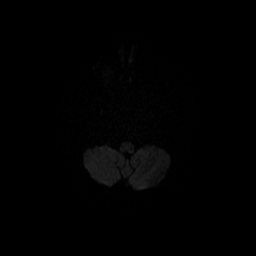
[im 34/100]
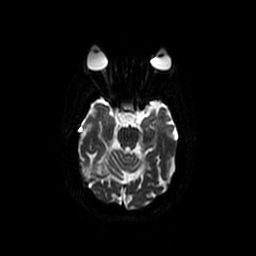
[im 50/100]
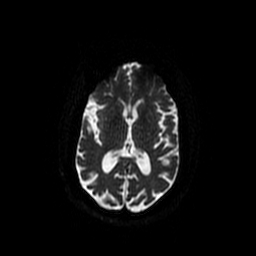
[im 67/100]
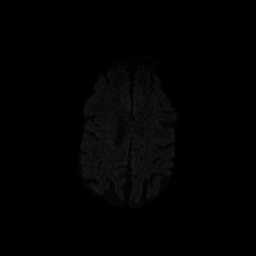
[im 83/100]
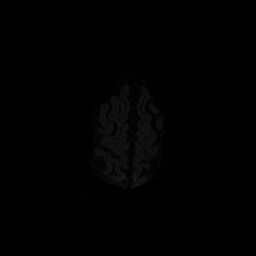
[im 100/100]
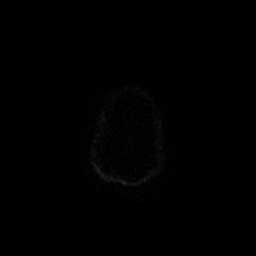

[Series 4: DWI · coronal · 5.0mm · 1.09mm/px · 6 of 74 slices shown (2 of 4)]
[im 1/74]
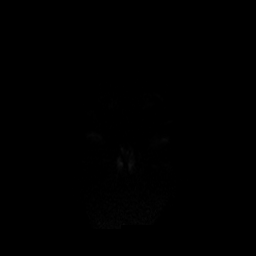
[im 15/74]
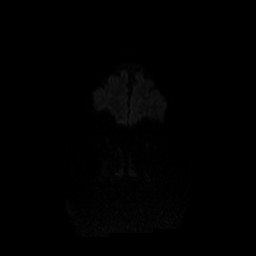
[im 30/74]
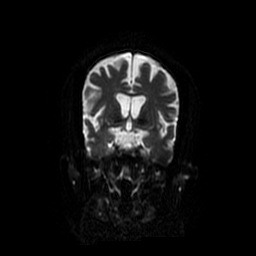
[im 44/74]
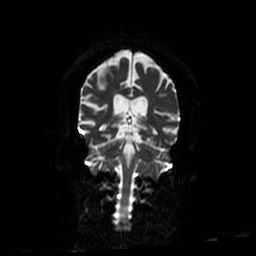
[im 59/74]
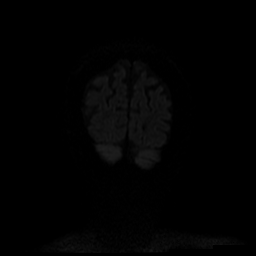
[im 74/74]
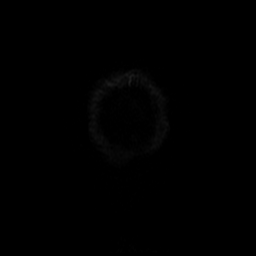

[Series 6: T2 · axial · 5.0mm · 0.47mm/px · 1 of 26 slices shown]
[im 1/26]
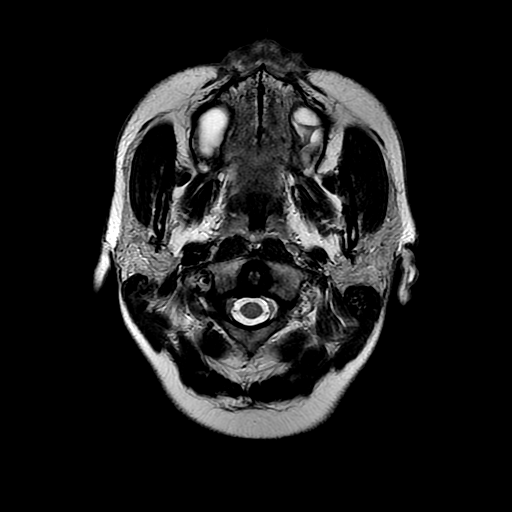

[Series 7: FLAIR · axial · 3.0mm · 0.47mm/px · z∈[-48,+100]mm · 2 of 26 slices shown]
[im 1/26]
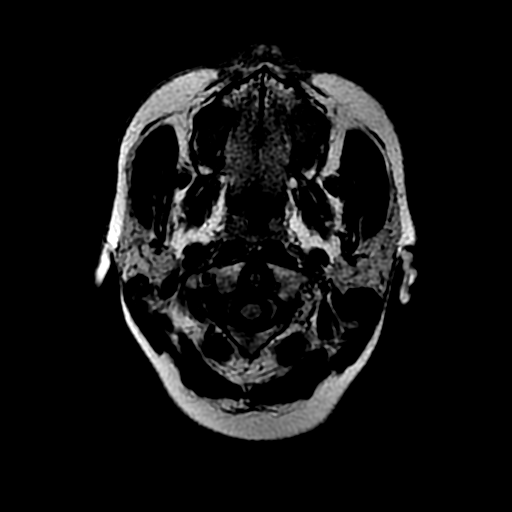
[im 26/26]
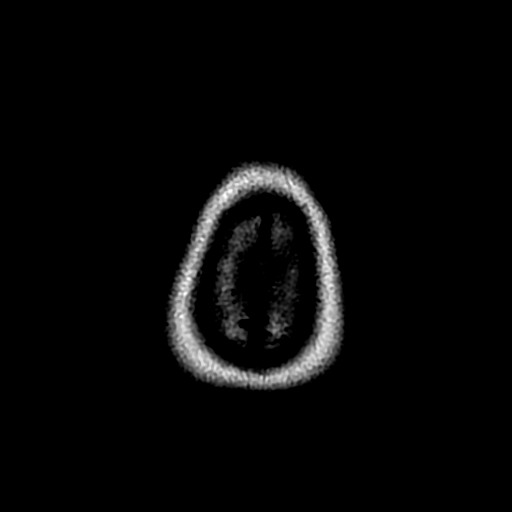

[Series 11: T1 post-contrast · axial · 3.0mm · 0.47mm/px · z∈[-50,+101]mm · 4 of 52 slices shown (1 of 4)]
[im 1/52]
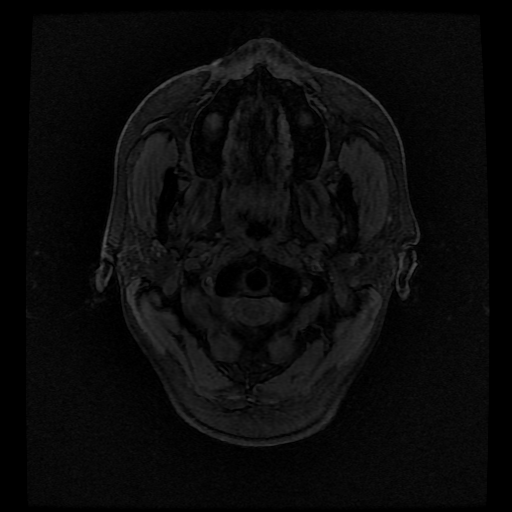
[im 18/52]
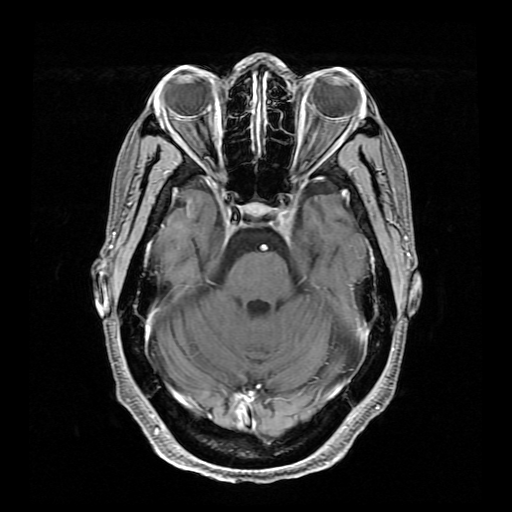
[im 35/52]
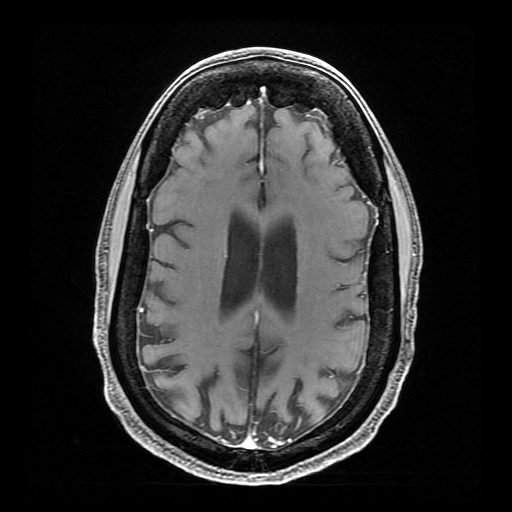
[im 52/52]
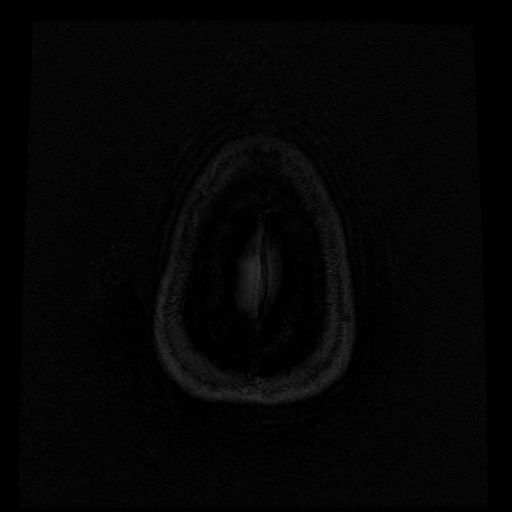

[Series 12: T1 post-contrast · coronal · 5.0mm · 0.43mm/px · 2 of 28 slices shown (2 of 4)]
[im 1/28]
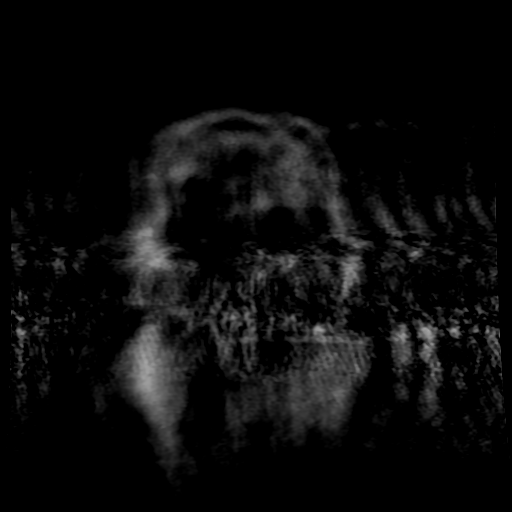
[im 28/28]
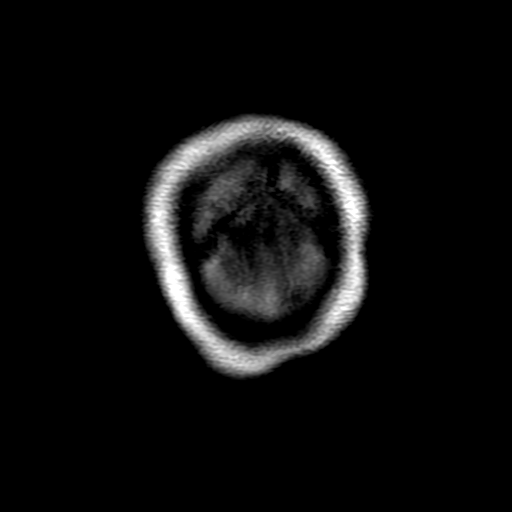

[Series 13: T1 post-contrast · sagittal · 5.0mm · 0.47mm/px · 2 of 23 slices shown (3 of 4)]
[im 1/23]
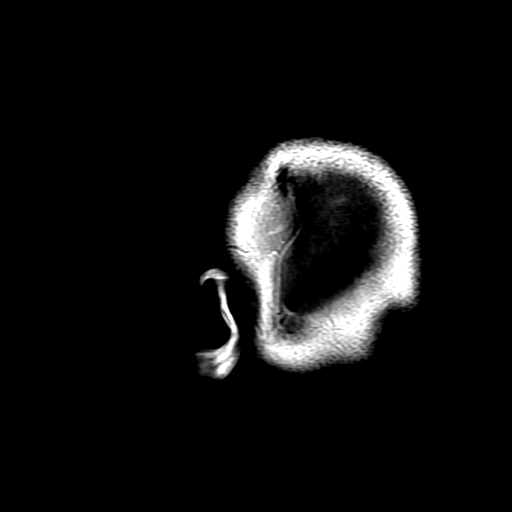
[im 23/23]
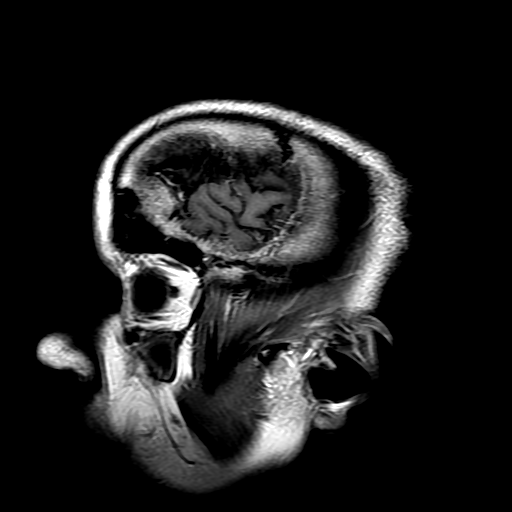

[Series 14: T1 post-contrast · coronal · 5.0mm · 0.43mm/px · 2 of 28 slices shown (4 of 4)]
[im 1/28]
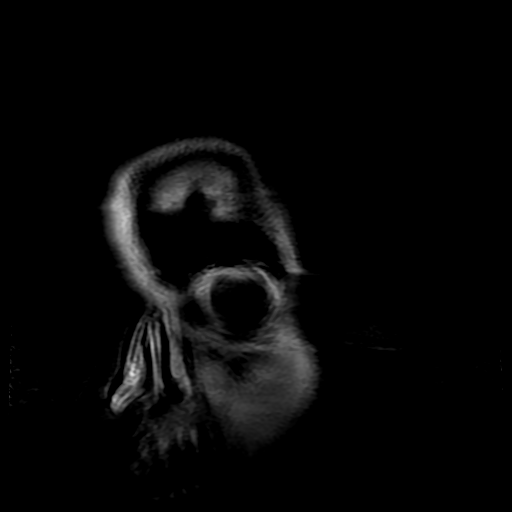
[im 28/28]
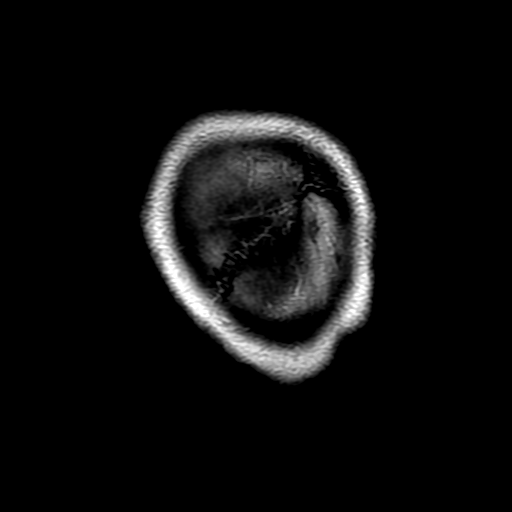

[Series 300: DWI · axial · 3.0mm · 1.09mm/px · z∈[-56,+87]mm · 4 of 45 slices shown (3 of 4)]
[im 1/45]
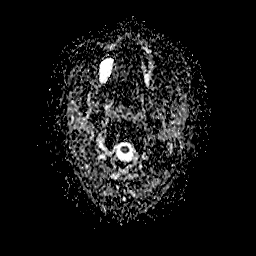
[im 15/45]
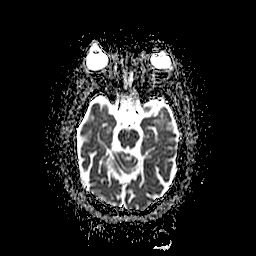
[im 30/45]
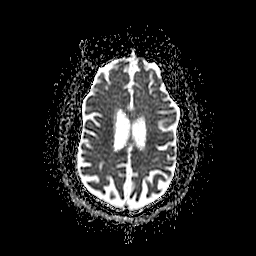
[im 45/45]
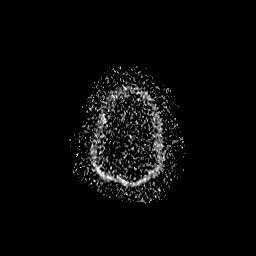

[Series 400: DWI · coronal · 5.0mm · 1.09mm/px · 3 of 37 slices shown (4 of 4)]
[im 1/37]
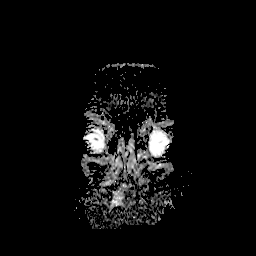
[im 19/37]
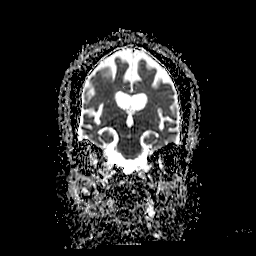
[im 37/37]
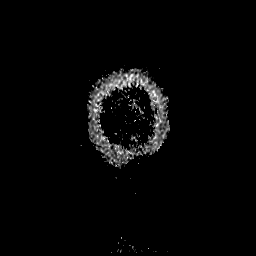

[33 of 48 positions shown; findings below may reference images not displayed]

FINDINGS: Brain: No restricted diffusion to suggest acute infarct. No acute
hemorrhage, mass, mass effect, or midline shift. No abnormal
enhancement. No extra-axial collection or hydrocephalus. Mildly
advanced cerebral atrophy for age.

Vascular: Normal flow voids.

Skull and upper cervical spine: Normal marrow signal. Hyperostosis
frontalis.

Sinuses/Orbits: Mucous retention cysts in the right greater than
left maxillary sinus. Otherwise negative.

Other: Trace fluid in the mastoid air cells
IMPRESSION: No acute intracranial process.  No evidence of brain mass or lesion.

## 2022-02-03 ENCOUNTER — Other Ambulatory Visit: Payer: Self-pay | Admitting: Family Medicine

## 2022-02-13 DIAGNOSIS — F70 Mild intellectual disabilities: Secondary | ICD-10-CM | POA: Diagnosis not present

## 2022-02-13 DIAGNOSIS — F250 Schizoaffective disorder, bipolar type: Secondary | ICD-10-CM | POA: Diagnosis not present

## 2022-02-25 ENCOUNTER — Ambulatory Visit: Payer: Medicare Other | Admitting: Family Medicine

## 2022-02-26 ENCOUNTER — Other Ambulatory Visit: Payer: Self-pay | Admitting: Family Medicine

## 2022-03-06 ENCOUNTER — Ambulatory Visit (INDEPENDENT_AMBULATORY_CARE_PROVIDER_SITE_OTHER): Payer: Medicare Other | Admitting: Family Medicine

## 2022-03-06 ENCOUNTER — Encounter: Payer: Self-pay | Admitting: Family Medicine

## 2022-03-06 VITALS — BP 118/80 | HR 97 | Temp 98.1°F | Ht 63.0 in | Wt 127.8 lb

## 2022-03-06 DIAGNOSIS — R350 Frequency of micturition: Secondary | ICD-10-CM | POA: Diagnosis not present

## 2022-03-06 DIAGNOSIS — K219 Gastro-esophageal reflux disease without esophagitis: Secondary | ICD-10-CM | POA: Diagnosis not present

## 2022-03-06 DIAGNOSIS — N183 Chronic kidney disease, stage 3 unspecified: Secondary | ICD-10-CM | POA: Diagnosis not present

## 2022-03-06 DIAGNOSIS — F209 Schizophrenia, unspecified: Secondary | ICD-10-CM | POA: Diagnosis not present

## 2022-03-06 DIAGNOSIS — K5909 Other constipation: Secondary | ICD-10-CM

## 2022-03-06 DIAGNOSIS — E1122 Type 2 diabetes mellitus with diabetic chronic kidney disease: Secondary | ICD-10-CM

## 2022-03-06 DIAGNOSIS — Z23 Encounter for immunization: Secondary | ICD-10-CM

## 2022-03-06 LAB — POCT GLYCOSYLATED HEMOGLOBIN (HGB A1C): HbA1c, POC (controlled diabetic range): 6.6 % (ref 0.0–7.0)

## 2022-03-06 LAB — POCT URINALYSIS DIP (MANUAL ENTRY)
Bilirubin, UA: NEGATIVE
Blood, UA: NEGATIVE
Glucose, UA: 250 mg/dL — AB
Ketones, POC UA: NEGATIVE mg/dL
Leukocytes, UA: NEGATIVE
Nitrite, UA: NEGATIVE
Protein Ur, POC: NEGATIVE mg/dL
Spec Grav, UA: <=1.005 — AB (ref 1.010–1.025)
Urobilinogen, UA: 0.2 E.U./dL
pH, UA: 6 (ref 5.0–8.0)

## 2022-03-06 MED ORDER — POLYETHYLENE GLYCOL 3350 17 GM/SCOOP PO POWD: 17.0000 g | Freq: Every day | ORAL | 3 refills | Status: DC

## 2022-03-06 NOTE — Progress Notes (Signed)
  Date of Visit: 03/06/2022   SUBJECTIVE:   HPI:  [name] presents today for routine follow-up.  She is accompanied by her caregiver [name].  Diabetes: Currently taking Jardiance 10 mg daily.  Tolerating this well.  Fasting sugars in the mornings are in the low 100s.  A1c today is 6.6.  Urinary frequency: Has continued to urinate frequently.  Has upcoming appointment in November with endocrinology to evaluate for presence of diabetes insipidus.  Denies dysuria.  Is also drinking quite a bit.  Constipation: At last visit we changed MiraLAX to be used daily as needed.  Has used occasionally, but has been very constipated.  On Sunday she had a very large bowel movement while at church that was approximately 11 cm across in diameter.  It was hard to push out.  She feels better after that, but has not had another bowel movement since then.  Schizophrenia: Continues to have medications adjusted by psychiatry nurse practitioner.  Has had some behavioral issues.  GERD: Taking Pepcid 20 mg twice a day.  Tolerating this well.  Caregiver denies any concerns or complaints from patient about this.  OBJECTIVE:   BP 118/80   Pulse 97   Temp 98.1 F (36.7 C)   Ht '5\' 3"'$  (1.6 m)   Wt 127 lb 12.8 oz (58 kg)   SpO2 100%   BMI 22.64 kg/m  Gen: No acute distress, pleasant, cooperative HEENT: Normocephalic, atraumatic Heart: Regular rate and rhythm, no murmur Lungs: Clear to auscultation bilaterally, normal effort Abdomen: Normoactive bowel sounds, abdomen soft, nontender to palpation, no masses or organomegaly appreciated Neuro: Alert, grossly nonfocal, speech normal Ext: No lower extremity edema appreciated bilaterally  ASSESSMENT/PLAN:   Health maintenance:  -Collected urine microalbumin to creatinine ratio today. -Scheduled Medicare annual wellness visit -Flu shot given today  CONSTIPATION, CHRONIC Worsened.  Recommend daily MiraLAX.  Sent in new prescription.  Follow-up if not  improving.  Type 2 diabetes mellitus with renal manifestations (HCC) Well controlled. Continue current medication regimen.  Collected urine microalbumin creatinine ratio today.  Schizophrenia ([place]) Continues to follow-up with psychiatry nurse practitioner.  Defer all management to them.  GERD (gastroesophageal reflux disease) Doing well on Pepcid twice a day.  Continue this medication.  FOLLOW UP: Follow up in 3 months for chronic medical problems  [name], [place]

## 2022-03-06 NOTE — Patient Instructions (Addendum)
It was great to see you again today!  Take miralax daily Return for Annual Wellness Visit as scheduled  Follow up with me in 3 months, sooner if needed  Be well, [name]

## 2022-03-07 NOTE — Assessment & Plan Note (Signed)
Continues to follow-up with psychiatry nurse practitioner.  Defer all management to them.

## 2022-03-07 NOTE — Assessment & Plan Note (Signed)
Doing well on Pepcid twice a day.  Continue this medication.

## 2022-03-07 NOTE — Assessment & Plan Note (Signed)
Worsened.  Recommend daily MiraLAX.  Sent in new prescription.  Follow-up if not improving.

## 2022-03-07 NOTE — Assessment & Plan Note (Signed)
Well controlled. Continue current medication regimen.  Collected urine microalbumin creatinine ratio today.

## 2022-03-08 LAB — MICROALBUMIN / CREATININE URINE RATIO
Creatinine, Urine: 19.5 mg/dL
Microalb/Creat Ratio: 39 mg/g creat — ABNORMAL HIGH (ref 0–29)
Microalbumin, Urine: 7.7 ug/mL

## 2022-03-12 ENCOUNTER — Telehealth: Payer: Self-pay | Admitting: Family Medicine

## 2022-03-12 DIAGNOSIS — N183 Chronic kidney disease, stage 3 unspecified: Secondary | ICD-10-CM

## 2022-03-12 MED ORDER — LOSARTAN POTASSIUM 25 MG PO TABS: 12.5000 mg | ORAL_TABLET | Freq: Every day | ORAL | 0 refills | Status: DC

## 2022-03-12 NOTE — Telephone Encounter (Signed)
Called caregiver [name] to discuss elevated microalbuminuria Recommend starting low dose ARB for renal protection She is agreeable. Rx sent in for losartan 12.'5mg'$  daily (half of '25mg'$  tablet) Bmet ordered, lab visit scheduled for 11/21 when she comes in for her Annual Wellness Visit   Attempted to reach guardian Delaney Meigs to discuss, no answer. Will try to call again tomorrow.  Leeanne Rio, MD

## 2022-03-13 NOTE — Telephone Encounter (Signed)
Updated - called Delaney Meigs, guardian, and informed her of these changes. All questions answered. She was appreciative of the call.  Leeanne Rio, MD

## 2022-03-14 DIAGNOSIS — F70 Mild intellectual disabilities: Secondary | ICD-10-CM | POA: Diagnosis not present

## 2022-03-14 DIAGNOSIS — F250 Schizoaffective disorder, bipolar type: Secondary | ICD-10-CM | POA: Diagnosis not present

## 2022-04-01 ENCOUNTER — Other Ambulatory Visit: Payer: Self-pay

## 2022-04-01 ENCOUNTER — Ambulatory Visit (INDEPENDENT_AMBULATORY_CARE_PROVIDER_SITE_OTHER): Payer: Medicare Other

## 2022-04-01 VITALS — Ht 65.5 in | Wt 121.0 lb

## 2022-04-01 DIAGNOSIS — N183 Chronic kidney disease, stage 3 unspecified: Secondary | ICD-10-CM

## 2022-04-01 DIAGNOSIS — Z30019 Encounter for initial prescription of contraceptives, unspecified: Secondary | ICD-10-CM

## 2022-04-01 DIAGNOSIS — E1122 Type 2 diabetes mellitus with diabetic chronic kidney disease: Secondary | ICD-10-CM

## 2022-04-01 DIAGNOSIS — Z0000 Encounter for general adult medical examination without abnormal findings: Secondary | ICD-10-CM

## 2022-04-01 DIAGNOSIS — Z30013 Encounter for initial prescription of injectable contraceptive: Secondary | ICD-10-CM | POA: Diagnosis not present

## 2022-04-01 MED ORDER — MEDROXYPROGESTERONE ACETATE 150 MG/ML IM SUSP
150.0000 mg | Freq: Once | INTRAMUSCULAR | Status: AC
  Administered 2022-04-01: 150 mg via INTRAMUSCULAR

## 2022-04-01 NOTE — Progress Notes (Signed)
Subjective:   [name] is a 46 y.o. female who presents for Medicare Annual (Subsequent) preventive examination.  Encounter participants: Patient: [name] - located at [facility] Nurse/Provider: Dorna Bloom - located at [facility] Others (if applicable): [name]  Review of Systems: Defer to PCP  Cardiac Risk Factors include: diabetes mellitus;hypertension  Objective:   Vitals: Ht 5' 5.5" (1.664 m)   Wt 121 lb (54.9 kg)   BMI 19.83 kg/m   Body mass index is 19.83 kg/m.     04/01/2022    3:12 PM 03/06/2022    8:52 AM 12/10/2021    9:29 AM 10/20/2021    1:47 PM 07/03/2021    7:31 PM 04/01/2021    1:13 PM 03/21/2021    2:45 AM  Advanced Directives  Does Patient Have a Medical Advance Directive? No No No No No No Unable to assess, patient is non-responsive or altered mental status  Would patient like information on creating a medical advance directive? No - Patient declined  No - Patient declined No - Patient declined No - Patient declined No - Patient declined    Tobacco Social History   Tobacco Use  Smoking Status Never   Passive exposure: Never  Smokeless Tobacco Never     Clinical Intake:  Pre-visit preparation completed: Yes  Pain Score: 0-No pain  Diabetic: Yes Last A1c was 6.3 on 03/06/2022 CBG: 110 this AM  How often do you need to have someone help you when you read instructions, pamphlets, or other written materials from your doctor or pharmacy?: 5 - Always  Interpreter Needed?: No  Past Medical History:  Diagnosis Date   Acne    Anemia    Cognitive impairment    Diabetes mellitus without complication ([place])    Internal hemorrhoid, bleeding 10/22/2010   Obesity    Schizoaffective disorder    SVT (supraventricular tachycardia)    with short PR interval - followed by Dr. Lovena Le   Past Surgical History:  Procedure Laterality Date   BREAST REDUCTION SURGERY  08/10/2005   HYSTEROSCOPY  09/09/2004   w/ removal of polyps   KIDNEY SURGERY  Left 05/17/2020   Tumor removal by Dr. Dema Severin   REDUCTION MAMMAPLASTY Bilateral 2007   SPINE SURGERY N/A 04/18/2020   Dr. Arnoldo Morale - pinched nerve of Lumbar Spine   Family History  Problem Relation Age of Onset   Schizophrenia Sister    Social History   Socioeconomic History   Marital status: Single    Spouse name: Not on file   Number of children: 0   Years of education: 10 years   Highest education level: 10th grade  Occupational History   Occupation: Disabled  Tobacco Use   Smoking status: Never    Passive exposure: Never   Smokeless tobacco: Never  Vaping Use   Vaping Use: Never used  Substance and Sexual Activity   Alcohol use: No   Drug use: No   Sexual activity: Never    Birth control/protection: Injection  Other Topics Concern   Not on file  Social History Narrative   Lives with caregiver [name] and her children. [phone].      Previously lived in group home. Twin sister with similar problem list. Has 24H care. Case manager is [name], [phone].      Caregiver works for [employer], [address] [place], [place] [id number].       Guardian is [name] of [employer] [phone]  office. After hours service [phone].      Patient is enjoying going to church and bible study. She is active in [employer].    Social Determinants of Health   Financial Resource Strain: Low Risk  (04/01/2022)   Overall Financial Resource Strain (CARDIA)    Difficulty of Paying Living Expenses: Not hard at all  Food Insecurity: No Food Insecurity (04/01/2022)   Hunger Vital Sign    Worried About Running Out of Food in the Last Year: Never true    Ran Out of Food in the Last Year: Never true  Transportation Needs: No Transportation Needs (04/01/2022)   PRAPARE - Hydrologist (Medical): No    Lack of Transportation (Non-Medical): No  Physical Activity: Sufficiently Active (04/01/2022)    Exercise Vital Sign    Days of Exercise per Week: 5 days    Minutes of Exercise per Session: 30 min  Stress: No Stress Concern Present (04/01/2022)   [place]    Feeling of Stress : Only a little  Social Connections: Moderately Integrated (04/01/2022)   Social Connection and Isolation Panel [NHANES]    Frequency of Communication with Friends and Family: Never    Frequency of Social Gatherings with Friends and Family: More than three times a week    Attends Religious Services: More than 4 times per year    Active Member of [employer] or Organizations: Yes    Attends Archivist Meetings: More than 4 times per year    Marital Status: Never married   Outpatient Encounter Medications as of 04/01/2022  Medication Sig   acetaminophen (TYLENOL) 500 MG tablet Take 500 mg by mouth every 8 (eight) hours as needed for fever.   aspirin (GOODSENSE ASPIRIN) 81 MG chewable tablet Chew 1 tablet (81 mg total) by mouth daily.   atorvastatin (LIPITOR) 20 MG tablet TAKE ONE TABLET BY MOUTH at bedtime   benztropine (COGENTIN) 1 MG tablet Take 1 mg by mouth daily.   cloZAPine (CLOZARIL) 100 MG tablet Take 150 mg by mouth at bedtime.   famotidine (PEPCID) 20 MG tablet TAKE ONE TABLET BY MOUTH TWICE DAILY   glucose blood (ONETOUCH VERIO) test strip Use as instructed- patient to test her glucose once daily   glycopyrrolate (ROBINUL) 2 MG tablet Take 2 mg by mouth daily as needed.   Lancets (ONETOUCH ULTRASOFT) lancets Use as instructed- patient to test once daily   losartan (COZAAR) 25 MG tablet Take 0.5 tablets (12.5 mg total) by mouth daily.   medroxyPROGESTERone (DEPO-PROVERA) 150 MG/ML injection Inject 150 mg into the muscle every 3 (three) months.   Multiple Vitamin (MULTIVITAMIN) tablet Take 1 tablet by mouth daily.   ondansetron (ZOFRAN-ODT) 4 MG disintegrating tablet Take 4 mg by mouth every 8 (eight) hours as needed for nausea or  vomiting.   Oxcarbazepine (TRILEPTAL) 300 MG tablet Take 1 tablet (300 mg total) by mouth 2 (two) times daily.   PARoxetine (PAXIL) 10 MG tablet Take 10 mg by mouth daily.   polyethylene glycol powder (MIRALAX) 17 GM/SCOOP powder Take 17 g by mouth daily.   risperiDONE ER (UZEDY) 100 MG/0.28ML SUSY Inject into the skin every 2 (two) months.   simethicone (GAS-X EXTRA STRENGTH) 125 MG chewable tablet Chew 1 tablet (125 mg total) by mouth daily as needed for flatulence.   valbenazine (INGREZZA) 40 MG capsule Take 40 mg by mouth at bedtime.   [DISCONTINUED] JARDIANCE 10 MG TABS  tablet TAKE ONE TABLET BY MOUTH ONCE DAILY (Patient taking differently: Take 10 mg by mouth daily.)   [EXPIRED] medroxyPROGESTERone (DEPO-PROVERA) injection 150 mg    No facility-administered encounter medications on file as of 04/01/2022.   Activities of Daily Living    04/01/2022    3:19 PM 10/21/2021    7:55 PM  In your present state of health, do you have any difficulty performing the following activities:  Hearing? 0 0  Vision? 0 0  Difficulty concentrating or making decisions? 1 1  Walking or climbing stairs? 0 1  Dressing or bathing? 1 1  Comment mostly does well by herself- does need some help   Doing errands, shopping? 1 1  Preparing Food and eating ? Y   Using the Toilet? N   In the past six months, have you accidently leaked urine? Y   Do you have problems with loss of bowel control? Y   Managing your Medications? Y   Managing your Finances? Y   Housekeeping or managing your Housekeeping? Y    Patient Care Team: Leeanne Rio, MD as PCP - General (Pediatrics) Marylynn Pearson, MD as Consulting Physician (Ophthalmology)    Assessment:   This is a routine wellness examination for [name].  Exercise Activities and Dietary recommendations Current Exercise Habits: Home exercise routine, Type of exercise: Other - see comments (Very active during the day), Time (Minutes): 30, Frequency (Times/Week):  5, Weekly Exercise (Minutes/Week): 150, Exercise limited by: psychological condition(s);cardiac condition(s)   Goals      HEMOGLOBIN A1C < 7     A1c 6.11 February 2022.       Fall Risk    04/01/2022    3:13 PM 12/10/2021    9:28 AM 12/23/2020   12:30 PM 02/28/2020   10:15 AM 02/13/2020   11:13 AM  Fall Risk   Falls in the past year? 1 0 0 0 0  Number falls in past yr: 1 0 0 0 0  Injury with Fall? 1 0 0 0 0  Risk for fall due to : Medication side effect  No Fall Risks    Follow up Falls prevention discussed  Falls evaluation completed     [name] reports falls related to medication side effect. Feels this has resolved. Patient does not use assistive devices to ambulate.   Is the patient's home free of loose throw rugs in walkways, pet beds, electrical cords, etc?   yes      Grab bars in the bathroom? yes      Handrails on the stairs?   yes      Adequate lighting?   yes  Patient rating of health (0-10) scale: 8  Depression Screen    04/01/2022    3:13 PM 03/06/2022    8:53 AM 12/10/2021    9:28 AM 10/14/2021    3:45 PM  PHQ 2/9 Scores  PHQ - 2 Score '6 6 2 4  '$ PHQ- 9 Score '15 15 14 12    '$ Cognitive Function    04/01/2022    3:22 PM 12/23/2020   12:31 PM  6CIT Screen  What Year? 4 points 4 points  What month? 0 points 0 points  What time? 3 points 3 points  Count back from 20 4 points 4 points  Months in reverse 4 points 4 points  Repeat phrase 10 points 4 points  Total Score 25 points 19 points   Immunization History  Administered Date(s) Administered   Influenza Split 03/10/2011, 03/02/2012  Influenza Whole 02/26/2009   Influenza,inj,Quad PF,6+ Mos 02/14/2013, 01/18/2014, 04/13/2015, 01/28/2016, 03/31/2017, 01/19/2018, 01/18/2019, 02/28/2020, 02/26/2021, 03/06/2022   Moderna Sars-Covid-2 Vaccination 07/11/2019, 08/08/2019, 03/12/2020   PFIZER Comirnaty(Gray Top)Covid-19 Tri-Sucrose Vaccine 12/06/2020   PNEUMOCOCCAL CONJUGATE-20 12/06/2020   Pfizer Covid-19 Vaccine  Bivalent Booster 86yr & up 02/26/2021   Pneumococcal Polysaccharide-23 02/22/2014   Td 11/10/1998   Tdap 08/07/2011, 01/06/2022   Qualifies for Shingles Vaccine? No Shingrix Completed: No, Education has been provided regarding the importance of this vaccine. Advised may receive this vaccine at local pharmacy or Health Dept. Aware to provide a copy of the vaccination record if obtained from local pharmacy or Health Dept. Verbalized acceptance and understanding.  Qualifies for Influenza Vaccine? Yes   Flu Vaccine Completed: Yes, Education has been provided regarding the importance of this vaccine. Advised may receive this vaccine at local pharmacy or Health Dept. Aware to provide a copy of the vaccination record if obtained from local pharmacy or Health Dept. Verbalized acceptance and understanding.  Qualifies for Covid Vaccine? Yes   Covid Completed: No, Education has been provided regarding the importance of this vaccine. Advised may receive this vaccine at local pharmacy or Health Dept. Aware to provide a copy of the vaccination record if obtained from local pharmacy or Health Dept. Verbalized acceptance and understanding.  Screening Tests Health Maintenance  Topic Date Due   FOOT EXAM  05/15/2021   COVID-19 Vaccine (6 - 2023-24 season) 01/10/2022   PAP SMEAR-Modifier  05/31/2022   HEMOGLOBIN A1C  09/05/2022   OPHTHALMOLOGY EXAM  01/24/2023   Diabetic kidney evaluation - Urine ACR  03/07/2023   Diabetic kidney evaluation - GFR measurement  04/02/2023   Medicare Annual Wellness (AWV)  04/02/2023   Fecal DNA (Cologuard)  03/16/2024   INFLUENZA VACCINE  Completed   Hepatitis C Screening  Completed   HIV Screening  Completed   HPV VACCINES  Aged Out   Cancer Screenings: Lung: Low Dose CT Chest recommended if Age 48-80years, 20 pack-year currently smoking OR have quit w/in 15years. Patient does not qualify. Breast:  Up to date on Mammogram? Yes  Completed June 2023. Up to date of Bone  Density/Dexa? NA Colorectal: Cologuard completed on 03/16/2021- next due 03/2024.  Additional Screenings: Hepatitis C Screening: Completed HIV Screening: Completed   Plan:  You received your depo injection today.  Next injection is due 06/17/2022-07/01/2022. Lab work today. Next A1c due in April 2024.  I have personally reviewed and noted the following in the patient's chart:   Medical and social history Use of alcohol, tobacco or illicit drugs  Current medications and supplements Functional ability and status Nutritional status Physical activity Advanced directives List of other physicians Hospitalizations, surgeries, and ER visits in previous 12 months Vitals Screenings to include cognitive, depression, and falls Referrals and appointments  In addition, I have reviewed and discussed with patient certain preventive protocols, quality metrics, and best practice recommendations. A written personalized care plan for preventive services as well as general preventive health recommendations were provided to patient.  EDorna Bloom CElko [place] 04/08/2022

## 2022-04-02 ENCOUNTER — Encounter: Payer: Self-pay | Admitting: Family Medicine

## 2022-04-02 LAB — BASIC METABOLIC PANEL
BUN/Creatinine Ratio: 13 (ref 9–23)
BUN: 23 mg/dL (ref 6–24)
CO2: 21 mmol/L (ref 20–29)
Calcium: 9.9 mg/dL (ref 8.7–10.2)
Chloride: 109 mmol/L — ABNORMAL HIGH (ref 96–106)
Creatinine, Ser: 1.79 mg/dL — ABNORMAL HIGH (ref 0.57–1.00)
Glucose: 112 mg/dL — ABNORMAL HIGH (ref 70–99)
Potassium: 4.4 mmol/L (ref 3.5–5.2)
Sodium: 146 mmol/L — ABNORMAL HIGH (ref 134–144)
eGFR: 35 mL/min/{1.73_m2} — ABNORMAL LOW (ref >59)

## 2022-04-08 ENCOUNTER — Ambulatory Visit (INDEPENDENT_AMBULATORY_CARE_PROVIDER_SITE_OTHER): Payer: Medicare Other | Admitting: Neurology

## 2022-04-08 ENCOUNTER — Encounter: Payer: Self-pay | Admitting: Neurology

## 2022-04-08 ENCOUNTER — Other Ambulatory Visit: Payer: Self-pay | Admitting: Family Medicine

## 2022-04-08 VITALS — BP 125/76 | HR 115 | Ht 63.0 in | Wt 126.0 lb

## 2022-04-08 DIAGNOSIS — R569 Unspecified convulsions: Secondary | ICD-10-CM

## 2022-04-08 DIAGNOSIS — F209 Schizophrenia, unspecified: Secondary | ICD-10-CM

## 2022-04-08 MED ORDER — OXCARBAZEPINE 300 MG PO TABS
300.0000 mg | ORAL_TABLET | Freq: Two times a day (BID) | ORAL | 4 refills | Status: DC
Start: 2022-04-08 — End: 2023-04-08

## 2022-04-08 NOTE — Progress Notes (Unsigned)
[facility]  PATIENT: [name] DOB: 1975-06-19  REQUESTING CLINICIAN: Leeanne Rio, MD HISTORY FROM: Patient and Aid Faye  REASON FOR VISIT: Seizure like activity    HISTORICAL  CHIEF COMPLAINT:  Chief Complaint  Patient presents with   Follow-up    Rm 14 with caretaker here for 6 month f/u. She reports behaviors has been out of character for her. Caretaker sts she will scream , lash out, and tremble most of the day at times she is unable to control. The pt states "demons" make her act like this. Psychiatry  Denies any recent seizure activity.     INTERVAL HISTORY 04/08/22:  [name] presents today for follow-up, she is accompanied by her aide [name].  Since last visit [name] denies any seizure or seizure-like activity.  She however reports worsening behavior.  She will scream, lashes out, tremble, and when asked [name] will mention that demons, the voices are making her do it.  She did follow-up with a psychiatrist who noted that these events are volitional, purely behavioral.  No seizures, she is compliant with the trileptal 300 mg BID.    INTERVAL HISTORY 12/05/2021: [name] presents today for follow-up, she is accompanied by her new therapeutic provider [name].  At last visit in May plan was to continue with Depakote 2000 mg nightly.  [name] reported that patient medications, notably the Seroquel and the Depakote was discontinued on July 6 due to possible movement disorder, and patient was started on Ingrezza injection.  On July 24 she was witnessed to have 2 generalized tonic-clonic seizures. She was taken to the ED, patient was started on Keppra 500 mg twice daily.  Caregiver also reports that prior to discontinuation of Depakote, patient had stopped taking it stating that the tablets are too large to swallow.  Caregiver reports that since starting [place] patient has been having excessive drooling, she is very loopy, she sleeps all day and she has  noted that patient is noncompliant and very defiant.  Her behavior got worse, she is having BM at night on her bed and smear the feces on the wall.  She also had lost weight.   INTERVAL HISTORY 10/02/2021:  Patient presents today for follow-up, she is accompanied by her caregiver [name].  Last visit was in February 28, since last visit no additional seizure.  Patient reports that sometimes she can feel the seizures coming then she starts shaking. Caregiver reports during this time patient is alert and responsive.  No full tonic-clonic activity noted.  She is compliant with the Depakote 2000 mg at nighttime.  Caregiver also reported she has used Valtoco once due to ongoing shaking.    INTERVAL HISTORY 07/09/2021:  Patient presents today for follow-up, she is accompanied by her caregiver.  She presented to the ED on February 22 for another seizure.  Per caregiver who witnessed the event, patient extended arm and then fell backward she was noted to shake, foaming at the mouth, no urinary incontinence.  Episode lasted about 1 to 2-minutes with postictal confusion.  EMS was called and she presented to the ED.  In the ED her Depakote level was normal at 66. She was back to her normal self, discharge without changes to her ASM.    HISTORY OF PRESENT ILLNESS:  This is a 46 year old woman with past medical history of schizophrenia, cognitive impairment, and diabetes mellitus who is presenting after being admitted to the hospital for seizure-like activity.  Patient was initially admitted from November 9 to  November 14 after being witnessed to have seizure-like activity at her nursing home.  EMS was called and she was given 5 mg of Versed.  She was taken to the ED, EEG was negative for seizures but showed mild generalized slowing.  At that time she was also noted to be COVID-positive.  No other abnormal movement noted.  Her Depakote was increased to 1250.  Patient was discharged home then presented again to the ED on  November 21 for seizure-like activity, again it was described as she was shaking, unresponsive, EMS was called and again she was given Versed 5 mg and sent to the ED.  In the ED, her Depakote level was checked and was therapeutic and she was discharged home without any increase in her antiseizure medication.  Since being back to the nursing home, aide reports that she does have occasional seizure-like activity, she provided a video when you see patient shaking her head and was able to stop it upon command.  Denies any urinary incontinence no injuries, no tongue biting.   Handedness: Right   Seizure Type: Described as shaking   Current frequency: weekly   Any injuries from seizures: None   Seizure risk factors: Schizophrenia, otherwise none reported   Previous ASMs: Depakote but for mood, Keppra (worsening behavior)    [place] ASMs: Trileptal   ASMs side effects: None   Brain Images: within normal limits   Previous EEGs: Mild generalized slowing, no seizures, no interictal discharges   OTHER MEDICAL CONDITIONS: Schizophrenia, cognitive impairment, HTN, DMII, HLD  REVIEW OF SYSTEMS: Full 14 system review of systems performed and negative with exception of: unable to obtain   ALLERGIES: Allergies  Allergen Reactions   Minocycline     Unknown     HOME MEDICATIONS: Outpatient Medications Prior to Visit  Medication Sig Dispense Refill   acetaminophen (TYLENOL) 500 MG tablet Take 500 mg by mouth every 8 (eight) hours as needed for fever.     aspirin (GOODSENSE ASPIRIN) 81 MG chewable tablet Chew 1 tablet (81 mg total) by mouth daily. 90 tablet 3   atorvastatin (LIPITOR) 20 MG tablet TAKE ONE TABLET BY MOUTH at bedtime 90 tablet 3   benztropine (COGENTIN) 1 MG tablet Take 1 mg by mouth daily.     cloZAPine (CLOZARIL) 100 MG tablet Take 150 mg by mouth at bedtime.     empagliflozin (JARDIANCE) 10 MG TABS tablet Take 1 tablet (10 mg total) by mouth daily. 90 tablet 1   famotidine  (PEPCID) 20 MG tablet TAKE ONE TABLET BY MOUTH TWICE DAILY 180 tablet 1   glucose blood (ONETOUCH VERIO) test strip Use as instructed- patient to test her glucose once daily 100 each 3   glycopyrrolate (ROBINUL) 2 MG tablet Take 2 mg by mouth daily as needed.     Lancets (ONETOUCH ULTRASOFT) lancets Use as instructed- patient to test once daily 100 each 12   losartan (COZAAR) 25 MG tablet Take 0.5 tablets (12.5 mg total) by mouth daily. 45 tablet 0   medroxyPROGESTERone (DEPO-PROVERA) 150 MG/ML injection Inject 150 mg into the muscle every 3 (three) months.     Multiple Vitamin (MULTIVITAMIN) tablet Take 1 tablet by mouth daily.     ondansetron (ZOFRAN-ODT) 4 MG disintegrating tablet Take 4 mg by mouth every 8 (eight) hours as needed for nausea or vomiting.     PARoxetine (PAXIL) 10 MG tablet Take 10 mg by mouth daily.     polyethylene glycol powder (MIRALAX) 17 GM/SCOOP powder Take  17 g by mouth daily. 510 g 3   risperiDONE ER (UZEDY) 100 MG/0.28ML SUSY Inject into the skin every 30 (thirty) days.     simethicone (GAS-X EXTRA STRENGTH) 125 MG chewable tablet Chew 1 tablet (125 mg total) by mouth daily as needed for flatulence. 30 tablet 4   valbenazine (INGREZZA) 40 MG capsule Take 40 mg by mouth at bedtime.     Oxcarbazepine (TRILEPTAL) 300 MG tablet Take 1 tablet (300 mg total) by mouth 2 (two) times daily. 60 tablet 6   No facility-administered medications prior to visit.    PAST MEDICAL HISTORY: Past Medical History:  Diagnosis Date   Acne    Anemia    Cognitive impairment    Diabetes mellitus without complication (HCC)    Internal hemorrhoid, bleeding 10/22/2010   Obesity    Schizoaffective disorder    SVT (supraventricular tachycardia)    with short PR interval - followed by Dr. Lovena Le    PAST SURGICAL HISTORY: Past Surgical History:  Procedure Laterality Date   BREAST REDUCTION SURGERY  08/10/2005   HYSTEROSCOPY  09/09/2004   w/ removal of polyps   KIDNEY SURGERY Left  05/17/2020   Tumor removal by Dr. Dema Severin   REDUCTION MAMMAPLASTY Bilateral 2007   SPINE SURGERY N/A 04/18/2020   Dr. Arnoldo Morale - pinched nerve of Lumbar Spine    FAMILY HISTORY: Family History  Problem Relation Age of Onset   Schizophrenia Sister     SOCIAL HISTORY: Social History   Socioeconomic History   Marital status: Single    Spouse name: Not on file   Number of children: 0   Years of education: 10 years   Highest education level: 10th grade  Occupational History   Occupation: Disabled  Tobacco Use   Smoking status: Never    Passive exposure: Never   Smokeless tobacco: Never  Vaping Use   Vaping Use: Never used  Substance and Sexual Activity   Alcohol use: No   Drug use: No   Sexual activity: Never    Birth control/protection: Injection  Other Topics Concern   Not on file  Social History Narrative   Lives with caregiver Vanessa Ralphs and her children. [phone].      Previously lived in group home. Twin sister with similar problem list. Has 24H care. Case manager is Justin Mend, [phone].      Caregiver works for [employer], [address] [place], [place] [address].       Guardian is Delaney Meigs of [employer] [phone] office. After hours service [phone].      Patient is enjoying going to church and bible study. She is active in [employer].    Social Determinants of Health   Financial Resource Strain: Low Risk  (04/01/2022)   Overall Financial Resource Strain (CARDIA)    Difficulty of Paying Living Expenses: Not hard at all  Food Insecurity: No Food Insecurity (04/01/2022)   Hunger Vital Sign    Worried About Running Out of Food in the Last Year: Never true    Ran Out of Food in the Last Year: Never true  Transportation Needs: No Transportation Needs (04/01/2022)   PRAPARE - Hydrologist (Medical): No    Lack of Transportation (Non-Medical): No  Physical Activity:  Sufficiently Active (04/01/2022)   Exercise Vital Sign    Days of Exercise per Week: 5 days    Minutes of Exercise per Session: 30 min  Stress:  No Stress Concern Present (04/01/2022)   [place]    Feeling of Stress : Only a little  Social Connections: Moderately Integrated (04/01/2022)   Social Connection and Isolation Panel [NHANES]    Frequency of Communication with Friends and Family: Never    Frequency of Social Gatherings with Friends and Family: More than three times a week    Attends Religious Services: More than 4 times per year    Active Member of [employer] or Organizations: Yes    Attends Archivist Meetings: More than 4 times per year    Marital Status: Never married  Intimate Partner Violence: Not At Risk (04/01/2022)   Humiliation, Afraid, Rape, and Kick questionnaire    Fear of Current or Ex-Partner: No    Emotionally Abused: No    Physically Abused: No    Sexually Abused: No     PHYSICAL EXAM  GENERAL EXAM/CONSTITUTIONAL: Vitals:  Vitals:   04/08/22 1448  BP: 125/76  Pulse: (!) 115  Weight: 126 lb (57.2 kg)  Height: _0  (1.6 m)   Body mass index is 22.32 kg/m. Wt Readings from Last 3 Encounters:  04/08/22 126 lb (57.2 kg)  04/01/22 121 lb (54.9 kg)  03/06/22 127 lb 12.8 oz (58 kg)   Patient is in no distress; well developed, nourished and groomed; neck is supple  EYES: Pupils round and reactive to light, Visual fields full to confrontation, Extraocular movements intacts,  No results found.  MUSCULOSKELETAL: Gait, strength, tone, movements noted in Neurologic exam below  NEUROLOGIC: MENTAL STATUS:      No data to display         awake, alert, oriented to person, place and time Able to follow simple commands    CRANIAL NERVE:  2nd, 3rd, 4th, 6th - visual fields full to confrontation, extraocular muscles intact, no nystagmus 5th - facial sensation symmetric 7th -  facial strength symmetric 8th - hearing intact 9th - palate elevates symmetrically, uvula midline 11th - shoulder shrug symmetric 12th - tongue protrusion midline  MOTOR:  normal bulk and tone, full strength in the BUE, BLE. Noted to have a tremors on exam but tremors is suppressible   REFLEXES:  deep tendon reflexes present and symmetric  GAIT/STATION:  Wide based, short stride, mildly unsteady    DIAGNOSTIC DATA (LABS, IMAGING, TESTING) - I reviewed patient records, labs, notes, testing and imaging myself where available.  Lab Results  Component Value Date   WBC 7.1 12/02/2021   HGB 10.5 (L) 12/02/2021   HCT 31.0 (L) 12/02/2021   MCV 105.1 (H) 12/02/2021   PLT 224 12/02/2021      Component Value Date/Time   NA 146 (H) 04/01/2022 1647   K 4.4 04/01/2022 1647   CL 109 (H) 04/01/2022 1647   CO2 21 04/01/2022 1647   GLUCOSE 112 (H) 04/01/2022 1647   GLUCOSE 194 (H) 12/02/2021 1620   BUN 23 04/01/2022 1647   CREATININE 1.79 (H) 04/01/2022 1647   CREATININE 1.79 (H) 06/23/2016 1025   CALCIUM 9.9 04/01/2022 1647   PROT 6.1 (L) 12/02/2021 1612   PROT 6.6 02/28/2020 1207   ALBUMIN 2.8 (L) 12/02/2021 1612   ALBUMIN 4.0 02/28/2020 1207   AST 28 12/02/2021 1612   ALT 18 12/02/2021 1612   ALKPHOS 92 12/02/2021 1612   BILITOT 0.2 (L) 12/02/2021 1612   BILITOT 0.3 02/28/2020 1207   GFRNONAA 24 (L) 12/02/2021 1612   GFRNONAA 34 (L) 06/23/2016 1025  GFRAA 47 (L) 02/28/2020 1207   GFRAA 40 (L) 06/23/2016 1025   Lab Results  Component Value Date   CHOL 63 (L) 11/18/2021   HDL 32 (L) 11/18/2021   LDLCALC 16 11/18/2021   LDLDIRECT 60 04/02/2012   TRIG 67 11/18/2021   Lab Results  Component Value Date   HGBA1C 6.6 03/06/2022   Lab Results  Component Value Date   ZOXWRUEA54 098 11/16/2021   Lab Results  Component Value Date   TSH 1.049 11/15/2021    Depakote level 11/21: 73  Depakote level 2/22: 66   MRI Brain with and without contrast 03/20/2021 No acute  intracranial process.  No evidence of brain mass or lesion   EEG 03/21/2021: This study is suggestive of mild diffuse encephalopathy, nonspecific etiology. No seizures or epileptiform discharges were seen throughout the recording.   Routine EEG 07/16/21:  This is an abnormal EEG recording in the waking and drowsy state due to diffuse slowing. Diffuse slowing is consistent with a generalized brain dysfunction such as in encephalopathy.    ASSESSMENT AND PLAN  45 y.o. year old female  with past medical history of schizophrenia, cognitive impairment, hypertension, hyperlipidemia, and diabetes mellitus who is presenting for seizure follow up. No seizures since last visit, compliant with the Trileptal 300 mg BID but there is report of worsening behavior. Continue with Trileptal 300 mg BID and continue to follow up with psychiatry.    1. Seizure-like activity ([place])   2. Schizophrenia, unspecified type [facility])      Patient Instructions  Continue current medications  Follow up with psychiatry as scheduled  Follow up in a year    Per [facility] statutes, patients with seizures are not allowed to drive until they have been seizure-free for six months.  Other recommendations include using caution when using heavy equipment or power tools. Avoid working on ladders or at heights. Take showers instead of baths.  Do not swim alone.  Ensure the water temperature is not too high on the home water heater. Do not go swimming alone. Do not lock yourself in a room alone (i.e. bathroom). When caring for infants or small children, sit down when holding, feeding, or changing them to minimize risk of injury to the child in the event you have a seizure. Maintain good sleep hygiene. Avoid alcohol.  Also recommend adequate sleep, hydration, good diet and minimize stress.   During the Seizure  - First, ensure adequate ventilation and place patients on the floor on their left side  Loosen clothing around the  neck and ensure the airway is patent. If the patient is clenching the teeth, do not force the mouth open with any object as this can cause severe damage - Remove all items from the surrounding that can be hazardous. The patient may be oblivious to what's happening and may not even know what he or she is doing. If the patient is confused and wandering, either gently guide him/her away and block access to outside areas - Reassure the individual and be comforting - Call 911. In most cases, the seizure ends before EMS arrives. However, there are cases when seizures may last over 3 to 5 minutes. Or the individual may have developed breathing difficulties or severe injuries. If a pregnant patient or a person with diabetes develops a seizure, it is prudent to call an ambulance. - Finally, if the patient does not regain full consciousness, then call EMS. Most patients will remain confused for about 45 to  90 minutes after a seizure, so you must use judgment in calling for help. - Avoid restraints but make sure the patient is in a bed with padded side rails - Place the individual in a lateral position with the neck slightly flexed; this will help the saliva drain from the mouth and prevent the tongue from falling backward - Remove all nearby furniture and other hazards from the area - Provide verbal assurance as the individual is regaining consciousness - Provide the patient with privacy if possible - Call for help and start treatment as ordered by the caregiver   After the Seizure (Postictal Stage)  After a seizure, most patients experience confusion, fatigue, muscle pain and/or a headache. Thus, one should permit the individual to sleep. For the next few days, reassurance is essential. Being calm and helping reorient the person is also of importance.  Most seizures are painless and end spontaneously. Seizures are not harmful to others but can lead to complications such as stress on the lungs, brain and the  heart. Individuals with prior lung problems may develop labored breathing and respiratory distress.     No orders of the defined types were placed in this encounter.   Meds ordered this encounter  Medications   Oxcarbazepine (TRILEPTAL) 300 MG tablet    Sig: Take 1 tablet (300 mg total) by mouth 2 (two) times daily.    Dispense:  180 tablet    Refill:  4    Return in about 1 year (around 04/09/2023).  I have spent a total of 30 minutes dedicated to this patient today, preparing to see patient, performing a medically appropriate examination and evaluation, ordering tests and/or medications and procedures, and counseling and educating the patient/family/caregiver; independently interpreting result and communicating results to the family/patient/caregiver; and documenting clinical information in the electronic medical record.   Alric Ran, MD 04/09/2022, 9:14 AM  [facility] [address], [place], [place] [address] [phone]

## 2022-04-08 NOTE — Patient Instructions (Addendum)
[name]   Thank you for taking time to come for your Medicare Wellness Visit. I appreciate your ongoing commitment to your health goals. Please review the following plan we discussed and let me know if I can assist you in the future.    These are the goals we discussed:   Goals      HEMOGLOBIN A1C < 7     A1c 6.11 February 2022.       We also discussed recommended health maintenance. As discussed, you are due for: Health Maintenance  Topic Date Due   FOOT EXAM  05/15/2021   COVID-19 Vaccine (6 - 2023-24 season) 01/10/2022   PAP SMEAR-Modifier  05/31/2022   HEMOGLOBIN A1C  09/05/2022   OPHTHALMOLOGY EXAM  01/24/2023   Diabetic kidney evaluation - Urine ACR  03/07/2023   Diabetic kidney evaluation - GFR measurement  04/02/2023   Medicare Annual Wellness (AWV)  04/02/2023   Fecal DNA (Cologuard)  03/16/2024   INFLUENZA VACCINE  Completed   Hepatitis C Screening  Completed   HIV Screening  Completed   HPV VACCINES  Aged Out   You received your depo injection today.  Next injection is due 06/17/2022-07/01/2022. Lab work today. Next A1c due in April 2024.  Preventive Care 48-48 Years Old, Female Preventive care refers to lifestyle choices and visits with your health care provider that can promote health and wellness. Preventive care visits are also called wellness exams. What can I expect for my preventive care visit? Counseling Your health care provider may ask you questions about your: Medical history, including: Past medical problems. Family medical history. Pregnancy history. Current health, including: Menstrual cycle. Method of birth control. Emotional well-being. Home life and relationship well-being. Sexual activity and sexual health. Lifestyle, including: Alcohol, nicotine or tobacco, and drug use. Access to firearms. Diet, exercise, and sleep habits. Work and work Statistician. Sunscreen use. Safety issues such as seatbelt and bike helmet use. Physical  exam Your health care provider will check your: Height and weight. These may be used to calculate your BMI (body mass index). BMI is a measurement that tells if you are at a healthy weight. Waist circumference. This measures the distance around your waistline. This measurement also tells if you are at a healthy weight and may help predict your risk of certain diseases, such as type 2 diabetes and high blood pressure. Heart rate and blood pressure. Body temperature. Skin for abnormal spots. What immunizations do I need?  Vaccines are usually given at various ages, according to a schedule. Your health care provider will recommend vaccines for you based on your age, medical history, and lifestyle or other factors, such as travel or where you work. What tests do I need? Screening Your health care provider may recommend screening tests for certain conditions. This may include: Lipid and cholesterol levels. Diabetes screening. This is done by checking your blood sugar (glucose) after you have not eaten for a while (fasting). Pelvic exam and Pap test. Hepatitis B test. Hepatitis C test. HIV (human immunodeficiency virus) test. STI (sexually transmitted infection) testing, if you are at risk. Lung cancer screening. Colorectal cancer screening. Mammogram. Talk with your health care provider about when you should start having regular mammograms. This may depend on whether you have a family history of breast cancer. BRCA-related cancer screening. This may be done if you have a family history of breast, ovarian, tubal, or peritoneal cancers. Bone density scan. This is done to screen for osteoporosis. Talk with your health care  provider about your test results, treatment options, and if necessary, the need for more tests. Follow these instructions at home: Eating and drinking  Eat a diet that includes fresh fruits and vegetables, whole grains, lean protein, and low-fat dairy products. Take vitamin  and mineral supplements as recommended by your health care provider. Do not drink alcohol if: Your health care provider tells you not to drink. You are pregnant, may be pregnant, or are planning to become pregnant. If you drink alcohol: Limit how much you have to 0-1 drink a day. Know how much alcohol is in your drink. In the [place] one drink equals one 12 oz bottle of beer (355 mL), one 5 oz glass of wine (148 mL), or one 1 oz glass of hard liquor (44 mL). Lifestyle Brush your teeth every morning and night with fluoride toothpaste. Floss one time each day. Exercise for at least 30 minutes 5 or more days each week. Do not use any products that contain nicotine or tobacco. These products include cigarettes, chewing tobacco, and vaping devices, such as e-cigarettes. If you need help quitting, ask your health care provider. Do not use drugs. If you are sexually active, practice safe sex. Use a condom or other form of protection to prevent STIs. If you do not wish to become pregnant, use a form of birth control. If you plan to become pregnant, see your health care provider for a prepregnancy visit. Take aspirin only as told by your health care provider. Make sure that you understand how much to take and what form to take. Work with your health care provider to find out whether it is safe and beneficial for you to take aspirin daily. Find healthy ways to manage stress, such as: Meditation, yoga, or listening to music. Journaling. Talking to a trusted person. Spending time with friends and family. Minimize exposure to UV radiation to reduce your risk of skin cancer. Safety Always wear your seat belt while driving or riding in a vehicle. Do not drive: If you have been drinking alcohol. Do not ride with someone who has been drinking. When you are tired or distracted. While texting. If you have been using any mind-altering substances or drugs. Wear a helmet and other protective equipment during  sports activities. If you have firearms in your house, make sure you follow all gun safety procedures. Seek help if you have been physically or sexually abused. What's next? Visit your health care provider once a year for an annual wellness visit. Ask your health care provider how often you should have your eyes and teeth checked. Stay up to date on all vaccines. This information is not intended to replace advice given to you by your health care provider. Make sure you discuss any questions you have with your health care provider. Document Revised: 10/24/2020 Document Reviewed: 10/24/2020 Elsevier Patient Education  [place] clinic's number is [phone]. Please call with questions or concerns about what we discussed today.

## 2022-04-09 DIAGNOSIS — R809 Proteinuria, unspecified: Secondary | ICD-10-CM | POA: Diagnosis not present

## 2022-04-09 DIAGNOSIS — E232 Diabetes insipidus: Secondary | ICD-10-CM | POA: Diagnosis not present

## 2022-04-09 DIAGNOSIS — E1165 Type 2 diabetes mellitus with hyperglycemia: Secondary | ICD-10-CM | POA: Diagnosis not present

## 2022-04-09 DIAGNOSIS — N183 Chronic kidney disease, stage 3 unspecified: Secondary | ICD-10-CM | POA: Diagnosis not present

## 2022-04-09 DIAGNOSIS — F209 Schizophrenia, unspecified: Secondary | ICD-10-CM | POA: Diagnosis not present

## 2022-04-09 NOTE — Patient Instructions (Signed)
Continue current medications  Follow up with psychiatry as scheduled  Follow up in a year

## 2022-04-10 DIAGNOSIS — E232 Diabetes insipidus: Secondary | ICD-10-CM | POA: Diagnosis not present

## 2022-04-14 DIAGNOSIS — F250 Schizoaffective disorder, bipolar type: Secondary | ICD-10-CM | POA: Diagnosis not present

## 2022-04-14 DIAGNOSIS — F70 Mild intellectual disabilities: Secondary | ICD-10-CM | POA: Diagnosis not present

## 2022-05-01 ENCOUNTER — Other Ambulatory Visit: Payer: Self-pay | Admitting: *Deleted

## 2022-05-02 MED ORDER — EMPAGLIFLOZIN 10 MG PO TABS
10.0000 mg | ORAL_TABLET | Freq: Every day | ORAL | 1 refills | Status: DC
Start: 2022-05-02 — End: 2022-09-11

## 2022-05-02 NOTE — Progress Notes (Signed)
I have reviewed this visit and agree with the documentation.   

## 2022-05-13 DIAGNOSIS — F250 Schizoaffective disorder, bipolar type: Secondary | ICD-10-CM | POA: Diagnosis not present

## 2022-05-13 DIAGNOSIS — F70 Mild intellectual disabilities: Secondary | ICD-10-CM | POA: Diagnosis not present

## 2022-06-06 ENCOUNTER — Other Ambulatory Visit: Payer: Self-pay | Admitting: Family Medicine

## 2022-06-16 DIAGNOSIS — F70 Mild intellectual disabilities: Secondary | ICD-10-CM | POA: Diagnosis not present

## 2022-06-16 DIAGNOSIS — F250 Schizoaffective disorder, bipolar type: Secondary | ICD-10-CM | POA: Diagnosis not present

## 2022-06-19 ENCOUNTER — Ambulatory Visit: Payer: Medicare Other

## 2022-06-23 ENCOUNTER — Ambulatory Visit (INDEPENDENT_AMBULATORY_CARE_PROVIDER_SITE_OTHER): Payer: Medicare Other

## 2022-06-23 DIAGNOSIS — Z30013 Encounter for initial prescription of injectable contraceptive: Secondary | ICD-10-CM | POA: Diagnosis not present

## 2022-06-23 MED ORDER — MEDROXYPROGESTERONE ACETATE 150 MG/ML IM SUSP
150.0000 mg | Freq: Once | INTRAMUSCULAR | Status: AC
  Administered 2022-06-23: 150 mg via INTRAMUSCULAR

## 2022-06-23 NOTE — Progress Notes (Signed)
Reviewed and agree Leeanne Rio, MD

## 2022-06-23 NOTE — Progress Notes (Signed)
Patient here today for Depo Provera injection and is within her dates.    Last contraceptive appt was 01/06/2022.  Depo given in [place] today. Site unremarkable & patient tolerated injection.    Next injection due 09/09/2022-09/23/2022.    Next depo scheduled for 4/30 @2$ :30pm.

## 2022-07-14 DIAGNOSIS — F250 Schizoaffective disorder, bipolar type: Secondary | ICD-10-CM | POA: Diagnosis not present

## 2022-07-14 DIAGNOSIS — F70 Mild intellectual disabilities: Secondary | ICD-10-CM | POA: Diagnosis not present

## 2022-07-31 DIAGNOSIS — E119 Type 2 diabetes mellitus without complications: Secondary | ICD-10-CM | POA: Diagnosis not present

## 2022-07-31 DIAGNOSIS — H40023 Open angle with borderline findings, high risk, bilateral: Secondary | ICD-10-CM | POA: Diagnosis not present

## 2022-07-31 DIAGNOSIS — H43813 Vitreous degeneration, bilateral: Secondary | ICD-10-CM | POA: Diagnosis not present

## 2022-08-01 ENCOUNTER — Telehealth: Payer: Self-pay

## 2022-08-01 MED ORDER — CENTRUM ADULTS PO TABS
1.0000 | ORAL_TABLET | Freq: Every day | ORAL | 3 refills | Status: DC
Start: 2022-08-01 — End: 2022-09-11

## 2022-08-01 NOTE — Telephone Encounter (Signed)
Rx sent in. Jacqueline Shaver J Jovie Swanner, MD  

## 2022-08-01 NOTE — Addendum Note (Signed)
Addended by: Leeanne Rio on: 08/01/2022 04:46 PM   Modules accepted: Orders

## 2022-08-01 NOTE — Telephone Encounter (Signed)
Caregiver calls nurse line requesting a refill on patients multivitamin.   She reports she has been taking Centrum for Adults.   Will forward to PCP to send in.

## 2022-08-04 ENCOUNTER — Telehealth: Payer: Self-pay | Admitting: *Deleted

## 2022-08-04 ENCOUNTER — Other Ambulatory Visit: Payer: Self-pay | Admitting: Family Medicine

## 2022-08-04 NOTE — Progress Notes (Unsigned)
  Care Coordination   Note   08/04/2022 Name: [name] MRN: [id number] DOB: 22-Aug-1975  [name] is a 47 y.o. year old female who sees Ardelia Mems, Delorse Limber, MD for primary care. I reached out to [name] by phone today to offer care coordination services.  [name] was given information about Care Coordination services today including:   The Care Coordination services include support from the care team which includes your Nurse Coordinator, Clinical Social Worker, or Pharmacist.  The Care Coordination team is here to help remove barriers to the health concerns and goals most important to you. Care Coordination services are voluntary, and the patient may decline or stop services at any time by request to their care team member.   Care Coordination Consent Status: Patient Legal Guardian [name] agreed to services and verbal consent obtained.   Follow up plan:  Unsuccessful telephone outreach attempt made. A HIPAA compliant phone message was left for the patient providing contact information and requesting a return call.  Encounter Outcome:  Pt. Request to Call Back, Patient is living in an assisted living.   [name]  Direct Dial: [phone]

## 2022-08-05 NOTE — Telephone Encounter (Signed)
Please let patient's caregiver know I am refilling this medication, but she needs to schedule an appointment with me.  ? ?Thanks, ?Phelan Schadt J Edna Grover, MD  ?

## 2022-08-05 NOTE — Progress Notes (Unsigned)
  Care Coordination  Outreach Note  08/05/2022 Name: Tatem Klimas MRN: [id number] DOB: 06/22/1975   Care Coordination Outreach Attempts: An unsuccessful telephone outreach was attempted today to offer the patient information about available care coordination services as a benefit of their health plan.   Follow Up Plan:  Additional outreach attempts will be made to offer the patient care coordination information and services.   Encounter Outcome:  No Answer  [place]  Direct Dial: [phone]

## 2022-08-06 NOTE — Progress Notes (Signed)
  Care Coordination  Outreach Note  08/06/2022 Name: [name] MRN: [id number] DOB: [birth year]   Care Coordination Outreach Attempts: A third unsuccessful outreach was attempted today to offer the patient with information about available care coordination services as a benefit of their health plan.   Follow Up Plan:  No further outreach attempts will be made at this time. We have been unable to contact the patient to offer or enroll patient in care coordination services   Encounter Outcome:  No Answer  [place]: [phone]

## 2022-08-13 ENCOUNTER — Encounter: Payer: Self-pay | Admitting: Family Medicine

## 2022-08-13 LAB — HM DIABETES EYE EXAM

## 2022-08-14 DIAGNOSIS — F250 Schizoaffective disorder, bipolar type: Secondary | ICD-10-CM | POA: Diagnosis not present

## 2022-08-14 DIAGNOSIS — F70 Mild intellectual disabilities: Secondary | ICD-10-CM | POA: Diagnosis not present

## 2022-08-25 ENCOUNTER — Other Ambulatory Visit: Payer: Self-pay | Admitting: Family Medicine

## 2022-09-08 DIAGNOSIS — N183 Chronic kidney disease, stage 3 unspecified: Secondary | ICD-10-CM | POA: Diagnosis not present

## 2022-09-08 DIAGNOSIS — E1165 Type 2 diabetes mellitus with hyperglycemia: Secondary | ICD-10-CM | POA: Diagnosis not present

## 2022-09-08 DIAGNOSIS — R631 Polydipsia: Secondary | ICD-10-CM | POA: Diagnosis not present

## 2022-09-08 DIAGNOSIS — R809 Proteinuria, unspecified: Secondary | ICD-10-CM | POA: Diagnosis not present

## 2022-09-08 DIAGNOSIS — N251 Nephrogenic diabetes insipidus: Secondary | ICD-10-CM | POA: Diagnosis not present

## 2022-09-08 DIAGNOSIS — F209 Schizophrenia, unspecified: Secondary | ICD-10-CM | POA: Diagnosis not present

## 2022-09-09 ENCOUNTER — Ambulatory Visit: Payer: Medicare Other

## 2022-09-10 ENCOUNTER — Other Ambulatory Visit: Payer: Self-pay | Admitting: Family Medicine

## 2022-09-10 DIAGNOSIS — Z1231 Encounter for screening mammogram for malignant neoplasm of breast: Secondary | ICD-10-CM

## 2022-09-11 ENCOUNTER — Ambulatory Visit (INDEPENDENT_AMBULATORY_CARE_PROVIDER_SITE_OTHER): Payer: Medicare Other | Admitting: Family Medicine

## 2022-09-11 ENCOUNTER — Other Ambulatory Visit: Payer: Self-pay

## 2022-09-11 ENCOUNTER — Encounter: Payer: Self-pay | Admitting: Family Medicine

## 2022-09-11 VITALS — BP 120/84 | HR 82 | Ht 63.0 in | Wt 126.6 lb

## 2022-09-11 DIAGNOSIS — F209 Schizophrenia, unspecified: Secondary | ICD-10-CM

## 2022-09-11 DIAGNOSIS — E1122 Type 2 diabetes mellitus with diabetic chronic kidney disease: Secondary | ICD-10-CM

## 2022-09-11 DIAGNOSIS — N183 Chronic kidney disease, stage 3 unspecified: Secondary | ICD-10-CM | POA: Diagnosis not present

## 2022-09-11 DIAGNOSIS — Z309 Encounter for contraceptive management, unspecified: Secondary | ICD-10-CM

## 2022-09-11 DIAGNOSIS — N1831 Chronic kidney disease, stage 3a: Secondary | ICD-10-CM

## 2022-09-11 DIAGNOSIS — K5909 Other constipation: Secondary | ICD-10-CM

## 2022-09-11 DIAGNOSIS — E232 Diabetes insipidus: Secondary | ICD-10-CM | POA: Diagnosis not present

## 2022-09-11 LAB — POCT GLYCOSYLATED HEMOGLOBIN (HGB A1C): HbA1c, POC (controlled diabetic range): 6.3 % (ref 0.0–7.0)

## 2022-09-11 MED ORDER — POLYETHYLENE GLYCOL 3350 17 GM/SCOOP PO POWD: ORAL | 1 refills | Status: DC

## 2022-09-11 MED ORDER — FAMOTIDINE 20 MG PO TABS: 20.0000 mg | ORAL_TABLET | Freq: Two times a day (BID) | ORAL | 3 refills | Status: DC

## 2022-09-11 MED ORDER — MEDROXYPROGESTERONE ACETATE 150 MG/ML IM SUSY
150.0000 mg | PREFILLED_SYRINGE | Freq: Once | INTRAMUSCULAR | Status: AC
Start: 2022-09-11 — End: 2022-09-11
  Administered 2022-09-11: 150 mg via INTRAMUSCULAR

## 2022-09-11 MED ORDER — ONETOUCH ULTRASOFT LANCETS MISC
12 refills | Status: DC
Start: 2022-09-11 — End: 2022-12-19

## 2022-09-11 MED ORDER — CENTRUM ADULTS PO TABS: 1.0000 | ORAL_TABLET | Freq: Every day | ORAL | 3 refills | Status: DC

## 2022-09-11 MED ORDER — LOSARTAN POTASSIUM 25 MG PO TABS: 12.5000 mg | ORAL_TABLET | Freq: Every day | ORAL | 3 refills | Status: DC

## 2022-09-11 MED ORDER — ONETOUCH VERIO VI STRP
ORAL_STRIP | 3 refills | Status: DC
Start: 2022-09-11 — End: 2023-08-03

## 2022-09-11 MED ORDER — ASPIRIN 81 MG PO CHEW: CHEWABLE_TABLET | ORAL | 3 refills | Status: DC

## 2022-09-11 MED ORDER — ATORVASTATIN CALCIUM 20 MG PO TABS: 20.0000 mg | ORAL_TABLET | Freq: Every day | ORAL | 3 refills | Status: DC

## 2022-09-11 MED ORDER — EMPAGLIFLOZIN 10 MG PO TABS: 10.0000 mg | ORAL_TABLET | Freq: Every day | ORAL | 3 refills | Status: DC

## 2022-09-11 NOTE — Patient Instructions (Addendum)
It was great to see you again today.  Refilled all medications.  Depo given today  Discontinued Zofran and simethicone.  She can take acetaminophen as needed as per the standing orders.  MiraLAX can be given daily as needed.  Please schedule pap smear with GYN office  [facility] [address] [place],  [place]  [address] Main: [phone]  Follow-up with me in 6 months  Be well, Dr. Pollie Meyer

## 2022-09-11 NOTE — Assessment & Plan Note (Addendum)
A1c well-controlled at 6.3.  Continue Jardiance, also continue losartan for renal protection.

## 2022-09-11 NOTE — Assessment & Plan Note (Addendum)
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  Checking BMET today, will forward results to endocrinologist Dr. Roanna Raider.  Continue follow-up with endocrine.

## 2022-09-11 NOTE — Progress Notes (Signed)
  Date of Visit: 09/11/2022   SUBJECTIVE:   HPI:  [name] presents today for routine follow-up.  She is accompanied by her caregiver.  Depo: Due for Depo today.  She is within dates.  Diabetes: Currently taking Jardiance 10 mg daily.  Tolerating this well.  Fasting sugars are ranging in the 70s to 100s.  A1c today is 6.3.  She is taking losartan 12.5 mg daily for renal protection.  Diabetes insipidus: Was seen by [facility] endocrinology Dr. Roanna Raider and confirmed to have diabetes insipidus, thought likely due to clozapine.  She has since stopped clozapine and is now taking Risperdal injections and Ingrezza, prescribed by psychiatric NP.  Needs BMET checked today with results forwarded to Dr. Roanna Raider, fax number [phone].  Constipation: Has been taking MiraLAX daily but caregiver reports that sometimes stools are runny, request to change this to daily as needed.  OBJECTIVE:   BP 120/84   Pulse 82   Ht 5\' 3"  (1.6 m)   Wt 126 lb 9.6 oz (57.4 kg)   SpO2 100%   BMI 22.43 kg/m  Gen: No acute distress, pleasant, cooperative, well-appearing HEENT: Normocephalic, atraumatic Heart: Regular rate and rhythm, no murmur Lungs: Clear to auscultation bilaterally, normal effort Neuro: Grossly nonfocal, speech normal Ext: No edema bilateral lower extremities  ASSESSMENT/PLAN:   Health maintenance:  -Declines current COVID booster -Advised to schedule Pap smear with GYN office.  She sees GYN for these exams due to needing a much smaller speculum.  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  Checking BMET today, will forward results to endocrinologist Dr. Roanna Raider.  Continue follow-up with endocrine.  Type 2 diabetes mellitus with renal manifestations (HCC) A1c well-controlled at 6.3.  Continue Jardiance, also continue losartan for renal protection.  Schizophrenia (HCC) Continues to follow with psych NP for management of  schizophrenia.  Seems to be doing well on present medication regimen.  CONSTIPATION, CHRONIC Adjust MiraLAX to daily as needed.  Discontinued order for simethicone and Zofran as she has not taken these at all recently and caregiver requested these be discontinued.  FOLLOW UP: Follow up in 6 months for diabetes  Jacqueline J. Pollie Meyer, MD [facility]

## 2022-09-11 NOTE — Assessment & Plan Note (Signed)
Continues to follow with psych NP for management of schizophrenia.  Seems to be doing well on present medication regimen.

## 2022-09-11 NOTE — Assessment & Plan Note (Signed)
Adjust MiraLAX to daily as needed.  Discontinued order for simethicone and Zofran as she has not taken these at all recently and caregiver requested these be discontinued.

## 2022-09-12 ENCOUNTER — Encounter: Payer: Self-pay | Admitting: Family Medicine

## 2022-09-12 DIAGNOSIS — F250 Schizoaffective disorder, bipolar type: Secondary | ICD-10-CM | POA: Diagnosis not present

## 2022-09-12 DIAGNOSIS — F70 Mild intellectual disabilities: Secondary | ICD-10-CM | POA: Diagnosis not present

## 2022-09-12 LAB — BASIC METABOLIC PANEL
BUN/Creatinine Ratio: 18 (ref 9–23)
BUN: 32 mg/dL — ABNORMAL HIGH (ref 6–24)
CO2: 19 mmol/L — ABNORMAL LOW (ref 20–29)
Calcium: 9.8 mg/dL (ref 8.7–10.2)
Chloride: 107 mmol/L — ABNORMAL HIGH (ref 96–106)
Creatinine, Ser: 1.79 mg/dL — ABNORMAL HIGH (ref 0.57–1.00)
Glucose: 105 mg/dL — ABNORMAL HIGH (ref 70–99)
Potassium: 4.6 mmol/L (ref 3.5–5.2)
Sodium: 145 mmol/L — ABNORMAL HIGH (ref 134–144)
eGFR: 34 mL/min/{1.73_m2} — ABNORMAL LOW (ref >59)

## 2022-10-09 DIAGNOSIS — F250 Schizoaffective disorder, bipolar type: Secondary | ICD-10-CM | POA: Diagnosis not present

## 2022-10-14 DIAGNOSIS — F70 Mild intellectual disabilities: Secondary | ICD-10-CM | POA: Diagnosis not present

## 2022-10-14 DIAGNOSIS — F250 Schizoaffective disorder, bipolar type: Secondary | ICD-10-CM | POA: Diagnosis not present

## 2022-10-20 ENCOUNTER — Ambulatory Visit
Admission: RE | Admit: 2022-10-20 | Discharge: 2022-10-20 | Disposition: A | Discharge status: Home / Self Care | Payer: Medicare Other | Source: Ambulatory Visit | Attending: Family Medicine | Admitting: Family Medicine

## 2022-10-20 DIAGNOSIS — Z1231 Encounter for screening mammogram for malignant neoplasm of breast: Secondary | ICD-10-CM | POA: Diagnosis not present

## 2022-10-21 ENCOUNTER — Other Ambulatory Visit: Payer: Self-pay | Admitting: Family Medicine

## 2022-10-21 DIAGNOSIS — R928 Other abnormal and inconclusive findings on diagnostic imaging of breast: Secondary | ICD-10-CM

## 2022-11-03 ENCOUNTER — Ambulatory Visit
Admission: RE | Admit: 2022-11-03 | Discharge: 2022-11-03 | Disposition: A | Discharge status: Home / Self Care | Payer: Medicare Other | Source: Ambulatory Visit | Attending: Family Medicine | Admitting: Family Medicine

## 2022-11-03 DIAGNOSIS — R928 Other abnormal and inconclusive findings on diagnostic imaging of breast: Secondary | ICD-10-CM | POA: Diagnosis not present

## 2022-11-03 DIAGNOSIS — N6320 Unspecified lump in the left breast, unspecified quadrant: Secondary | ICD-10-CM | POA: Diagnosis not present

## 2022-11-05 ENCOUNTER — Ambulatory Visit (INDEPENDENT_AMBULATORY_CARE_PROVIDER_SITE_OTHER): Payer: Medicare Other | Admitting: Obstetrics and Gynecology

## 2022-11-05 ENCOUNTER — Encounter: Payer: Self-pay | Admitting: Obstetrics and Gynecology

## 2022-11-05 ENCOUNTER — Other Ambulatory Visit: Payer: Self-pay

## 2022-11-05 VITALS — BP 107/74 | HR 93 | Wt 128.8 lb

## 2022-11-05 DIAGNOSIS — Z124 Encounter for screening for malignant neoplasm of cervix: Secondary | ICD-10-CM | POA: Diagnosis not present
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
## 2022-11-12 DIAGNOSIS — F250 Schizoaffective disorder, bipolar type: Secondary | ICD-10-CM | POA: Diagnosis not present

## 2022-11-12 DIAGNOSIS — F70 Mild intellectual disabilities: Secondary | ICD-10-CM | POA: Diagnosis not present

## 2022-11-18 DIAGNOSIS — F250 Schizoaffective disorder, bipolar type: Secondary | ICD-10-CM | POA: Diagnosis not present

## 2022-11-18 DIAGNOSIS — F70 Mild intellectual disabilities: Secondary | ICD-10-CM | POA: Diagnosis not present

## 2022-12-02 ENCOUNTER — Telehealth: Payer: Self-pay

## 2022-12-02 ENCOUNTER — Ambulatory Visit (INDEPENDENT_AMBULATORY_CARE_PROVIDER_SITE_OTHER): Payer: Medicare Other

## 2022-12-02 DIAGNOSIS — Z3042 Encounter for surveillance of injectable contraceptive: Secondary | ICD-10-CM

## 2022-12-02 MED ORDER — MEDROXYPROGESTERONE ACETATE 150 MG/ML IM SUSY
150.0000 mg | PREFILLED_SYRINGE | Freq: Once | INTRAMUSCULAR | Status: AC
Start: 2022-12-02 — End: 2022-12-02
  Administered 2022-12-02: 150 mg via INTRAMUSCULAR

## 2022-12-02 NOTE — Telephone Encounter (Signed)
Patient presents to nurse clinic with therapeutic provider, [name], for depo injection. [name] reports that she was told that today's visit would be with Dr. Pollie Meyer for form completion. Advised that visit was for nurse visit for Depo.   Informed that I would give forms to provider for completion. [name] reports that forms were due on 7/10 and that state will be coming soon and that they are out of compliance. Patient's last office visit was 09/19/22. [name] reports that paperwork can be completed from this visit, as this is within the calendar year. Advised of policy regarding turn around time of forms, however, I would let Dr. Pollie Meyer know that forms are needed as soon as possible.  Of note, [name] reports that patient does not take milk of magnesia and asked that this be marked out on forms. Form has been updated to reflect this.    Clinical information completed from office visit on 09/19/22.   Patient scheduled by front office for PCP follow up on 02/17/23.  Forms placed in PCP box for completion. Please return to RN team once forms are completed.   Thanks.   Veronda Prude, RN

## 2022-12-02 NOTE — Progress Notes (Signed)
Patient here today for Depo Provera injection and is within her dates.    Last contraceptive appt was 09/11/2022  Depo given in [place] today.  Site unremarkable & patient tolerated injection.    Next injection due 02/17/23-03/03/23.  Reminder card given.    Veronda Prude, RN

## 2022-12-04 NOTE — Telephone Encounter (Signed)
Forms completed, will return to [facility] RN team. Leeanne Rio, MD

## 2022-12-05 NOTE — Telephone Encounter (Signed)
Form placed up for pick up.   Copy for batch scanning.   [name] aware.

## 2022-12-11 DIAGNOSIS — F250 Schizoaffective disorder, bipolar type: Secondary | ICD-10-CM | POA: Diagnosis not present

## 2022-12-12 DIAGNOSIS — F250 Schizoaffective disorder, bipolar type: Secondary | ICD-10-CM | POA: Diagnosis not present

## 2022-12-12 DIAGNOSIS — F70 Mild intellectual disabilities: Secondary | ICD-10-CM | POA: Diagnosis not present

## 2022-12-19 ENCOUNTER — Other Ambulatory Visit: Payer: Self-pay | Admitting: Family Medicine

## 2022-12-19 DIAGNOSIS — N183 Chronic kidney disease, stage 3 unspecified: Secondary | ICD-10-CM

## 2023-01-06 DIAGNOSIS — E119 Type 2 diabetes mellitus without complications: Secondary | ICD-10-CM | POA: Diagnosis not present

## 2023-01-06 DIAGNOSIS — F209 Schizophrenia, unspecified: Secondary | ICD-10-CM | POA: Diagnosis not present

## 2023-01-06 DIAGNOSIS — N183 Chronic kidney disease, stage 3 unspecified: Secondary | ICD-10-CM | POA: Diagnosis not present

## 2023-01-06 DIAGNOSIS — R809 Proteinuria, unspecified: Secondary | ICD-10-CM | POA: Diagnosis not present

## 2023-01-13 DIAGNOSIS — F70 Mild intellectual disabilities: Secondary | ICD-10-CM | POA: Diagnosis not present

## 2023-01-13 DIAGNOSIS — F250 Schizoaffective disorder, bipolar type: Secondary | ICD-10-CM | POA: Diagnosis not present

## 2023-01-20 DIAGNOSIS — H43813 Vitreous degeneration, bilateral: Secondary | ICD-10-CM | POA: Diagnosis not present

## 2023-01-20 DIAGNOSIS — H40023 Open angle with borderline findings, high risk, bilateral: Secondary | ICD-10-CM | POA: Diagnosis not present

## 2023-01-20 DIAGNOSIS — E119 Type 2 diabetes mellitus without complications: Secondary | ICD-10-CM | POA: Diagnosis not present

## 2023-01-29 DIAGNOSIS — F250 Schizoaffective disorder, bipolar type: Secondary | ICD-10-CM | POA: Diagnosis not present

## 2023-01-29 DIAGNOSIS — F70 Mild intellectual disabilities: Secondary | ICD-10-CM | POA: Diagnosis not present

## 2023-02-11 DIAGNOSIS — F70 Mild intellectual disabilities: Secondary | ICD-10-CM | POA: Diagnosis not present

## 2023-02-11 DIAGNOSIS — F250 Schizoaffective disorder, bipolar type: Secondary | ICD-10-CM | POA: Diagnosis not present

## 2023-02-17 ENCOUNTER — Ambulatory Visit (INDEPENDENT_AMBULATORY_CARE_PROVIDER_SITE_OTHER): Payer: Medicare Other | Admitting: Family Medicine

## 2023-02-17 ENCOUNTER — Encounter: Payer: Self-pay | Admitting: Family Medicine

## 2023-02-17 VITALS — BP 109/80 | HR 87 | Ht 63.0 in | Wt 126.8 lb

## 2023-02-17 DIAGNOSIS — Z0000 Encounter for general adult medical examination without abnormal findings: Secondary | ICD-10-CM | POA: Diagnosis not present

## 2023-02-17 DIAGNOSIS — Z7984 Long term (current) use of oral hypoglycemic drugs: Secondary | ICD-10-CM

## 2023-02-17 DIAGNOSIS — Z3042 Encounter for surveillance of injectable contraceptive: Secondary | ICD-10-CM | POA: Diagnosis not present

## 2023-02-17 DIAGNOSIS — D649 Anemia, unspecified: Secondary | ICD-10-CM

## 2023-02-17 DIAGNOSIS — Z23 Encounter for immunization: Secondary | ICD-10-CM

## 2023-02-17 DIAGNOSIS — E232 Diabetes insipidus: Secondary | ICD-10-CM

## 2023-02-17 DIAGNOSIS — E1122 Type 2 diabetes mellitus with diabetic chronic kidney disease: Secondary | ICD-10-CM | POA: Diagnosis not present

## 2023-02-17 DIAGNOSIS — N1831 Chronic kidney disease, stage 3a: Secondary | ICD-10-CM | POA: Diagnosis not present

## 2023-02-17 LAB — POCT GLYCOSYLATED HEMOGLOBIN (HGB A1C): HbA1c, POC (controlled diabetic range): 6.1 % (ref 0.0–7.0)

## 2023-02-17 MED ORDER — MEDROXYPROGESTERONE ACETATE 150 MG/ML IM SUSY
150.0000 mg | PREFILLED_SYRINGE | Freq: Once | INTRAMUSCULAR | Status: AC
Start: 2023-02-17 — End: 2023-02-17
  Administered 2023-02-17: 150 mg via INTRAMUSCULAR

## 2023-02-17 MED ORDER — FAMOTIDINE 20 MG PO TABS: 20.0000 mg | ORAL_TABLET | Freq: Two times a day (BID) | ORAL | 3 refills | Status: DC

## 2023-02-17 MED ORDER — LOSARTAN POTASSIUM 25 MG PO TABS: 12.5000 mg | ORAL_TABLET | Freq: Every day | ORAL | 3 refills | Status: DC

## 2023-02-17 MED ORDER — ATORVASTATIN CALCIUM 20 MG PO TABS: 20.0000 mg | ORAL_TABLET | Freq: Every day | ORAL | 3 refills | Status: DC

## 2023-02-17 MED ORDER — EMPAGLIFLOZIN 10 MG PO TABS: 10.0000 mg | ORAL_TABLET | Freq: Every day | ORAL | 3 refills | Status: DC

## 2023-02-17 NOTE — Patient Instructions (Addendum)
It was great to see you again today.  Updating labs today COVID and flu shot today Depo given today If persistently having bleeding, would recommend pelvic exam to assess.  This will need to be done at the OB/GYN office.  Be well, Dr. Pollie Meyer

## 2023-02-17 NOTE — Assessment & Plan Note (Signed)
Flu and COVID vaccines today Urine microalbumin obtained today

## 2023-02-17 NOTE — Assessment & Plan Note (Signed)
Update A1c today Continue present medications Urine microalbumin obtained today

## 2023-02-17 NOTE — Progress Notes (Signed)
  Date of Visit: 02/17/2023   SUBJECTIVE:   HPI:  [name] presents today for routine follow up.  Diabetes -currently taking Jardiance 10 mg daily.  Sugars running in the low 130s fasting.  Due for A1c today.  Schizophrenia: Following with psychiatric NP regularly.  Has had numerous medication changes since I last saw her, all due to behavioral issues.  Per caregiver, not much is helping.  She is acting out frequently, running away, running into traffic, trying to harm herself.  Continues to see psych for ongoing medication changes.  Current regimen includes Ingrezza 40 mg daily, Abilify monthly injection, Abilify daily tablet, Haldol 5 mg twice daily, Ativan twice daily, paroxetine daily, Trileptal, Depakote multiple times a day, all being managed by psych NP.  Depo management: Has had spotting for the last 2 weeks.  It has resolved now.  Typically does not have bleeding.  Has been on Depo continuously for years.  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  They are awaiting labs to be drawn at our office today.   OBJECTIVE:   BP 109/80   Pulse 87   Ht 5\' 3"  (1.6 m)   Wt 126 lb 12.8 oz (57.5 kg)   SpO2 100%   BMI 22.46 kg/m  Gen: No acute distress, pleasant, cooperative HEENT: Normocephalic, atraumatic Heart: Tach regular rate and rhythm, no murmur Lungs: Clear to auscultation bilaterally, normal effort Neuro: Grossly nonfocal, speech normal Psych: Speaks in a quiet voice, diminished eye contact, speech slowed Ext: No edema  ASSESSMENT/PLAN:   Assessment & Plan Type 2 diabetes mellitus with stage 3a chronic kidney disease, without long-term current use of insulin (HCC) Update A1c today Continue present medications Urine microalbumin obtained today Routine adult health maintenance Flu and COVID vaccines today Urine microalbumin obtained today Anemia, unspecified type Noted history of anemia.  Update CBC today.  Also check iron studies. Encounter for  surveillance of injectable contraceptive Depo today. Advised if needs pelvic exam would likely need to go to GYN for exam, as we have not been successful with speculum exams here in our office in the past. Diabetes insipidus (HCC) Update BMET today, continue following up with endocrinology.  Will forward results to Dr. Roanna Raider.    FOLLOW UP: Follow up in 6 months for diabetes  [place] J. Pollie Meyer, MD [facility]

## 2023-02-17 NOTE — Assessment & Plan Note (Addendum)
Depo today. Advised if needs pelvic exam would likely need to go to GYN for exam, as we have not been successful with speculum exams here in our office in the past.

## 2023-02-17 NOTE — Assessment & Plan Note (Signed)
Update BMET today, continue following up with endocrinology.  Will forward results to Dr. Roanna Raider.

## 2023-02-18 LAB — IRON,TIBC AND FERRITIN PANEL
Ferritin: 95 ng/mL (ref 15–150)
Iron Saturation: 31 % (ref 15–55)
Iron: 88 ug/dL (ref 27–159)
Total Iron Binding Capacity: 282 ug/dL (ref 250–450)
UIBC: 194 ug/dL (ref 131–425)

## 2023-02-18 LAB — CBC
Hematocrit: 40.6 % (ref 34.0–46.6)
Hemoglobin: 12.9 g/dL (ref 11.1–15.9)
MCH: 28.5 pg (ref 26.6–33.0)
MCHC: 31.8 g/dL (ref 31.5–35.7)
MCV: 90 fL (ref 79–97)
Platelets: 188 10*3/uL (ref 150–450)
RBC: 4.52 x10E6/uL (ref 3.77–5.28)
RDW: 12.7 % (ref 11.7–15.4)
WBC: 6 10*3/uL (ref 3.4–10.8)

## 2023-02-18 LAB — LIPID PANEL
Chol/HDL Ratio: 2.4 {ratio} (ref 0.0–4.4)
Cholesterol, Total: 108 mg/dL (ref 100–199)
HDL: 45 mg/dL (ref >39)
LDL Chol Calc (NIH): 51 mg/dL (ref 0–99)
Triglycerides: 50 mg/dL (ref 0–149)
VLDL Cholesterol Cal: 12 mg/dL (ref 5–40)

## 2023-02-18 LAB — BASIC METABOLIC PANEL
BUN/Creatinine Ratio: 13 (ref 9–23)
BUN: 28 mg/dL — ABNORMAL HIGH (ref 6–24)
CO2: 23 mmol/L (ref 20–29)
Calcium: 10 mg/dL (ref 8.7–10.2)
Chloride: 108 mmol/L — ABNORMAL HIGH (ref 96–106)
Creatinine, Ser: 2.08 mg/dL — ABNORMAL HIGH (ref 0.57–1.00)
Glucose: 170 mg/dL — ABNORMAL HIGH (ref 70–99)
Potassium: 4.4 mmol/L (ref 3.5–5.2)
Sodium: 148 mmol/L — ABNORMAL HIGH (ref 134–144)
eGFR: 29 mL/min/{1.73_m2} — ABNORMAL LOW (ref >59)

## 2023-02-18 LAB — VITAMIN B12: Vitamin B-12: 1459 pg/mL — ABNORMAL HIGH (ref 232–1245)

## 2023-02-18 LAB — FOLATE: Folate: >20 ng/mL (ref >3.0)

## 2023-02-19 LAB — MICROALBUMIN / CREATININE URINE RATIO
Creatinine, Urine: 34.3 mg/dL
Microalb/Creat Ratio: 9 mg/g{creat} (ref 0–29)
Microalbumin, Urine: 3.1 ug/mL

## 2023-03-13 ENCOUNTER — Telehealth: Payer: Self-pay | Admitting: Family Medicine

## 2023-03-13 ENCOUNTER — Encounter: Payer: Self-pay | Admitting: Family Medicine

## 2023-03-13 DIAGNOSIS — F250 Schizoaffective disorder, bipolar type: Secondary | ICD-10-CM | POA: Diagnosis not present

## 2023-03-13 DIAGNOSIS — F70 Mild intellectual disabilities: Secondary | ICD-10-CM | POA: Diagnosis not present

## 2023-03-13 NOTE — Telephone Encounter (Signed)
Attempted to reach caregiver to touch base regarding recent results No answer, Left HIPAA-compliant voicemail asking them to call back  Labs overall stable, remains somewhat hypernatremic. Renal function decreased slightly from last check but overall within baseline of prior year.  Will send letter and CC results to endocrinologist.  Latrelle Dodrill, MD

## 2023-03-17 ENCOUNTER — Ambulatory Visit (HOSPITAL_COMMUNITY)
Admission: EM | Admit: 2023-03-17 | Discharge: 2023-03-17 | Disposition: A | Discharge status: Home / Self Care | Payer: Medicare Other | Attending: Family Medicine | Admitting: Family Medicine

## 2023-03-17 ENCOUNTER — Encounter (HOSPITAL_COMMUNITY): Payer: Self-pay

## 2023-03-17 DIAGNOSIS — H44812 Hemophthalmos, left eye: Secondary | ICD-10-CM
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
## 2023-03-17 NOTE — ED Triage Notes (Signed)
Caregiver brought patient in today with c/o left eye redness this morning between 4 and 6 am.

## 2023-03-17 NOTE — Discharge Instructions (Signed)
She was seen today for hemorrhage of the eye.  I see no evidence of abrasion or foreign body.  No evidence of infection.  I recommend you monitor for the next several days.  If she starts to complain of pain or has discharge from the eye please return for re-evaluation.

## 2023-03-19 ENCOUNTER — Ambulatory Visit (INDEPENDENT_AMBULATORY_CARE_PROVIDER_SITE_OTHER): Payer: Medicare Other

## 2023-03-19 VITALS — Ht 66.0 in | Wt 130.0 lb

## 2023-03-19 DIAGNOSIS — Z0000 Encounter for general adult medical examination without abnormal findings: Secondary | ICD-10-CM

## 2023-03-19 NOTE — Progress Notes (Signed)
 Because this visit was a virtual/telehealth visit,  certain criteria was not obtained, such a blood pressure, CBG if applicable, and timed get up and go. Any medications not marked as "taking" were not mentioned during the medication reconciliation part of the visit. Any vitals not documented were not able to be obtained due to this being a telehealth visit or patient was unable to self-report a recent blood pressure reading due to a lack of equipment at home via telehealth. Vitals that have been documented are verbally provided by the patient.   Visit completed with the help of patients caregiver, [name] Subjective:   [name] is a 47 y.o. female who presents for Medicare Annual (Subsequent) preventive examination.  Visit Complete: Virtual I connected with  [name] on 03/19/23 by a audio enabled telemedicine application and verified that I am speaking with the correct person using two identifiers.  Patient Location: Other:  alternative family living facility  Provider Location: Home Office  I discussed the limitations of evaluation and management by telemedicine. The patient expressed understanding and agreed to proceed.  Vital Signs: Because this visit was a virtual/telehealth visit, some criteria may be missing or patient reported. Any vitals not documented were not able to be obtained and vitals that have been documented are patient reported.  Patient Medicare AWV questionnaire was completed by the patient on na; I have confirmed that all information answered by patient is correct and no changes since this date.  Cardiac Risk Factors include: diabetes mellitus;hypertension;sedentary lifestyle     Objective:    Today's Vitals   03/19/23 0840  Weight: 130 lb (59 kg)  Height: 5\' 6"  (1.676 m)   Body mass index is 20.98 kg/m.     03/19/2023    8:40 AM 09/11/2022    8:31 AM 04/01/2022    3:12 PM 03/06/2022    8:52 AM 12/10/2021    9:29 AM 10/20/2021    1:47 PM  07/03/2021    7:31 PM  Advanced Directives  Does Patient Have a Medical Advance Directive? No No No No No No No  Would patient like information on creating a medical advance directive? No - Patient declined No - Patient declined No - Patient declined  No - Patient declined No - Patient declined No - Patient declined    Current Medications (verified) Outpatient Encounter Medications as of 03/19/2023  Medication Sig   ABILIFY MAINTENA 400 MG PRSY prefilled syringe SMARTSIG:1 Each IM Every 4 Weeks   ARIPiprazole (ABILIFY) 10 MG tablet Take 10 mg by mouth daily.   aspirin (GNP ADULT ASPIRIN LOW STRENGTH) 81 MG chewable tablet CHEW 1 TABLET DAILY   atorvastatin (LIPITOR) 20 MG tablet Take 1 tablet (20 mg total) by mouth at bedtime.   benztropine (COGENTIN) 1 MG tablet Take 1 mg by mouth daily.   divalproex (DEPAKOTE) 250 MG DR tablet Take by mouth. 2 tablets in AM, 1 at noon, and 2 at night   empagliflozin (JARDIANCE) 10 MG TABS tablet Take 1 tablet (10 mg total) by mouth daily.   famotidine (PEPCID) 20 MG tablet Take 1 tablet (20 mg total) by mouth 2 (two) times daily.   glucose blood (ONETOUCH VERIO) test strip Use as instructed- patient to test her glucose once daily   Lancets (ONETOUCH DELICA PLUS LANCET30G) MISC check blood sugar ONCE DAILY   LORazepam (ATIVAN) 1 MG tablet Take 1 mg by mouth daily. 1mg  at noon, 2mg  at bedtime   losartan (COZAAR) 25 MG tablet Take  0.5 tablets (12.5 mg total) by mouth daily.   medroxyPROGESTERone (DEPO-PROVERA) 150 MG/ML injection Inject 150 mg into the muscle every 3 (three) months.   Multiple Vitamins-Minerals (CENTRUM ADULTS) TABS Take 1 tablet by mouth daily.   Oxcarbazepine (TRILEPTAL) 300 MG tablet Take 1 tablet (300 mg total) by mouth 2 (two) times daily.   PARoxetine (PAXIL) 20 MG tablet Take 20 mg by mouth daily.   polyethylene glycol powder (GLYCOLAX/MIRALAX) 17 GM/SCOOP powder Take 17g daily as needed for constipation   QUEtiapine (SEROQUEL) 50 MG  tablet Take 50 mg by mouth at bedtime.   valbenazine (INGREZZA) 40 MG capsule Take 40 mg by mouth at bedtime.   No facility-administered encounter medications on file as of 03/19/2023.    Allergies (verified) Minocycline   History: Past Medical History:  Diagnosis Date   Acne    Anemia    Cognitive impairment    Diabetes mellitus without complication (HCC)    Internal hemorrhoid, bleeding 10/22/2010   Obesity    Schizoaffective disorder    SVT (supraventricular tachycardia) (HCC)    with short PR interval - followed by Dr. Ladona Ridgel   Past Surgical History:  Procedure Laterality Date   BREAST REDUCTION SURGERY  08/10/2005   HYSTEROSCOPY  09/09/2004   w/ removal of polyps   KIDNEY SURGERY Left 05/17/2020   Tumor removal by Dr. Charlyn Minerva   REDUCTION MAMMAPLASTY Bilateral 2007   SPINE SURGERY N/A 04/18/2020   Dr. Lovell Sheehan - pinched nerve of Lumbar Spine   Family History  Problem Relation Age of Onset   Schizophrenia Sister    Social History   Socioeconomic History   Marital status: Single    Spouse name: Not on file   Number of children: 0   Years of education: 10 years   Highest education level: 10th grade  Occupational History   Occupation: Disabled  Tobacco Use   Smoking status: Never    Passive exposure: Never   Smokeless tobacco: Never  Vaping Use   Vaping status: Never Used  Substance and Sexual Activity   Alcohol use: No   Drug use: No   Sexual activity: Never    Birth control/protection: Injection  Other Topics Concern   Not on file  Social History Narrative   Lives with caregiver [name] and her children. [phone].      Previously lived in group home. Twin sister with similar problem list. Has 24H care. Case manager is Ewell Poe, [phone].      Caregiver works for [employer]. [address]. [place], [place] [address].       Guardian is Trinna Balloon of [employer] [place] [phone] office. After hours service  [phone].      Patient is enjoying going to church and bible study. She is active in [employer].    Social Determinants of Health   Financial Resource Strain: Low Risk  (03/19/2023)   Overall Financial Resource Strain (CARDIA)    Difficulty of Paying Living Expenses: Not hard at all  Food Insecurity: No Food Insecurity (03/19/2023)   Hunger Vital Sign    Worried About Running Out of Food in the Last Year: Never true    Ran Out of Food in the Last Year: Never true  Transportation Needs: No Transportation Needs (03/19/2023)   PRAPARE - Administrator, Civil Service (Medical): No    Lack of Transportation (Non-Medical): No  Physical Activity: Patient Unable To Answer (03/19/2023)   Exercise Vital  Sign    Days of Exercise per Week: Patient unable to answer    Minutes of Exercise per Session: Patient unable to answer  Stress: No Stress Concern Present (03/19/2023)   [employer] of Occupational Health - Occupational Stress Questionnaire    Feeling of Stress : Only a little  Social Connections: Moderately Isolated (03/19/2023)   Social Connection and Isolation Panel [NHANES]    Frequency of Communication with Friends and Family: More than three times a week    Frequency of Social Gatherings with Friends and Family: More than three times a week    Attends Religious Services: More than 4 times per year    Active Member of [employer] or Organizations: No    Attends Engineer, structural: Never    Marital Status: Never married    Tobacco Counseling Counseling given: Yes   Clinical Intake:  Pre-visit preparation completed: Yes  Pain : No/denies pain     BMI - recorded: 20.98 Nutritional Status: BMI of 19-24  Normal Nutritional Risks: None Diabetes: Yes CBG done?: No (telehealth visit) Did pt. bring in CBG monitor from home?: No  How often do you need to have someone help you when you read instructions, pamphlets, or other written materials from  your doctor or pharmacy?: 5 - Always  Interpreter Needed?: No  Comments: visit completed with the help of patient's caregiver [name] Information entered by ::  [name], CMA   Activities of Daily Living    03/19/2023    9:01 AM 04/01/2022    3:19 PM  In your present state of health, do you have any difficulty performing the following activities:  Hearing? 0 0  Vision? 0 0  Difficulty concentrating or making decisions? 0 1  Walking or climbing stairs? 0 0  Dressing or bathing? 1 1  Comment intellectual disabilities mostly does well by herself- does need some help  Doing errands, shopping? 1 1  Comment caregiver takes her where she has to go   Quarry manager and eating ? Y Y  Comment due to disabilities   Using the Toilet? N N  In the past six months, have you accidently leaked urine? Y Y  Do you have problems with loss of bowel control? Y Y  Managing your Medications? Malvin Johns  Comment caregiver manages the medications   Managing your Finances? Malvin Johns  Comment caregiver does this   Housekeeping or managing your Housekeeping? Malvin Johns  Comment caregiver does this     Patient Care Team: Latrelle Dodrill, MD as PCP - General (Pediatrics) Chalmers Guest, MD as Consulting Physician (Ophthalmology)  Indicate any recent Medical Services you may have received from other than [facility] providers in the past year (date may be approximate).     Assessment:   This is a routine wellness examination for [name].  Hearing/Vision screen Hearing Screening - Comments:: Patient does not have hearing difficulties  Vision Screening - Comments:: Patient is up to date with yearly eye exams with Dr. Chalmers Guest   Goals Addressed             This Visit's Progress    Patient Stated       Make healthy choices and maintain her diabetes at a controlled status       Depression Screen    03/19/2023    8:53 AM 02/17/2023    8:28 AM 11/05/2022   11:59 AM 09/11/2022    8:30 AM 04/01/2022     3:13 PM 03/06/2022  8:53 AM 12/10/2021    9:28 AM  PHQ 2/9 Scores  PHQ - 2 Score  4 2 2 6 6 2   PHQ- 9 Score  19 6 10 15 15 14   Exception Documentation Other- indicate reason in comment box        Not completed patient unable to answer questions. Caregiver states she has a lot of attention seeking behavior. Maybe out of the last 7 days she has had 2 good days per caregiver.          Fall Risk    03/19/2023    9:01 AM 09/11/2022    8:30 AM 04/01/2022    3:13 PM 12/10/2021    9:28 AM 12/23/2020   12:30 PM  Fall Risk   Falls in the past year? 0 0 1 0 0  Number falls in past yr: 0 0 1 0 0  Injury with Fall? 0 0 1 0 0  Risk for fall due to : No Fall Risks  Medication side effect  No Fall Risks  Follow up Falls prevention discussed  Falls prevention discussed  Falls evaluation completed    MEDICARE RISK AT HOME: Medicare Risk at Home Any stairs in or around the home?: No If so, are there any without handrails?: No Home free of loose throw rugs in walkways, pet beds, electrical cords, etc?: Yes Adequate lighting in your home to reduce risk of falls?: Yes Life alert?: No Use of a cane, walker or w/c?: No Grab bars in the bathroom?: Yes Shower chair or bench in shower?: No Elevated toilet seat or a handicapped toilet?: No  TIMED UP AND GO:  Was the test performed?  No    Cognitive Function:    03/19/2023    8:52 AM  MMSE - Mini Mental State Exam  Not completed: Unable to complete        03/19/2023    8:46 AM 04/01/2022    3:22 PM 12/23/2020   12:31 PM  6CIT Screen  What Year? 0 points 4 points 4 points  What month? 0 points 0 points 0 points  What time? 0 points 3 points 3 points  Count back from 20 4 points 4 points 4 points  Months in reverse 4 points 4 points 4 points  Repeat phrase 8 points 10 points 4 points  Total Score 16 points 25 points 19 points    Immunizations Immunization History  Administered Date(s) Administered   Influenza Split 03/10/2011,  03/02/2012   Influenza Whole 02/26/2009   Influenza, Seasonal, Injecte, Preservative Fre 02/17/2023   Influenza,inj,Quad PF,6+ Mos 02/14/2013, 01/18/2014, 04/13/2015, 01/28/2016, 03/31/2017, 01/19/2018, 01/18/2019, 02/28/2020, 02/26/2021, 03/06/2022   Moderna Sars-Covid-2 Vaccination 07/11/2019, 08/08/2019, 03/12/2020   PFIZER Comirnaty(Gray Top)Covid-19 Tri-Sucrose Vaccine 12/06/2020   PNEUMOCOCCAL CONJUGATE-20 12/06/2020   Pfizer Covid-19 Vaccine Bivalent Booster 33yrs & up 02/26/2021   Pfizer(Comirnaty)Fall Seasonal Vaccine 12 years and older 02/17/2023   Pneumococcal Polysaccharide-23 02/22/2014   Td 11/10/1998   Tdap 08/07/2011, 01/06/2022    TDAP status: Up to date  Flu Vaccine status: Up to date  Pneumococcal vaccine status: Not age appropriate for this patient.    Covid-19 vaccine status: Completed vaccines  Qualifies for Shingles Vaccine? No   Shingrix: Not age appropriate for this patient.    Screening Tests Health Maintenance  Topic Date Due   FOOT EXAM  05/15/2021   Cervical Cancer Screening (Pap smear)  05/31/2022   Medicare Annual Wellness (AWV)  04/02/2023   COVID-19 Vaccine (7 - 2023-24 season)  04/14/2023   OPHTHALMOLOGY EXAM  08/13/2023   HEMOGLOBIN A1C  08/18/2023   Diabetic kidney evaluation - eGFR measurement  02/17/2024   Diabetic kidney evaluation - Urine ACR  02/17/2024   Fecal DNA (Cologuard)  03/16/2024   DTaP/Tdap/Td (4 - Td or Tdap) 01/07/2032   INFLUENZA VACCINE  Completed   Hepatitis C Screening  Completed   HIV Screening  Completed   HPV VACCINES  Aged Out    Health Maintenance  Health Maintenance Due  Topic Date Due   FOOT EXAM  05/15/2021   Cervical Cancer Screening (Pap smear)  05/31/2022   Medicare Annual Wellness (AWV)  04/02/2023    Colorectal cancer screening: Type of screening: Cologuard. Completed 03/16/2021. Repeat every 3 years  Mammogram status: Completed 10/20/2022. Repeat every year  Bone Density Screening: Not age  appropriate for this patient.    Lung Cancer Screening: (Low Dose CT Chest recommended if Age 27-80 years, 20 pack-year currently smoking OR have quit w/in 15years.) does not qualify.   Lung Cancer Screening Referral: na  Additional Screening:  Hepatitis C Screening: does not qualify; Completed 12/13/2019  Vision Screening: Recommended annual ophthalmology exams for early detection of glaucoma and other disorders of the eye. Is the patient up to date with their annual eye exam?  Yes  Who is the provider or what is the name of the office in which the patient attends annual eye exams? Dr. Harlon Flor If pt is not established with a provider, would they like to be referred to a provider to establish care? No .   Dental Screening: Recommended annual dental exams for proper oral hygiene  Diabetic Foot Exam: Diabetic Foot Exam: Overdue, Pt has been advised about the importance in completing this exam. Pt is scheduled for diabetic foot exam on provider notified.  Community Resource Referral / Chronic Care Management: CRR required this visit?  No   CCM required this visit?  No     Plan:     I have personally reviewed and noted the following in the patient's chart:   Medical and social history Use of alcohol, tobacco or illicit drugs  Current medications and supplements including opioid prescriptions. Patient is not currently taking opioid prescriptions. Functional ability and status Nutritional status Physical activity Advanced directives List of other physicians Hospitalizations, surgeries, and ER visits in previous 12 months Vitals Screenings to include cognitive, depression, and falls Referrals and appointments  In addition, I have reviewed and discussed with patient certain preventive protocols, quality metrics, and best practice recommendations. A written personalized care plan for preventive services as well as general preventive health recommendations were provided to  patient.     [name], CMA   03/19/2023   After Visit Summary: (Mail) Due to this being a telephonic visit, the after visit summary with patients personalized plan was offered to patient via mail   Nurse Notes: see routing comment

## 2023-03-19 NOTE — Patient Instructions (Signed)
[name] , Thank you for taking time to come for your Medicare Wellness Visit. I appreciate your ongoing commitment to your health goals. Please review the following plan we discussed and let me know if I can assist you in the future.   Referrals/Orders/Follow-Ups/Clinician Recommendations:  Next Medicare Annual Wellness Visit: March 21, 2024 at 8:40 am virtual visit  This is a list of the screening recommended for you and due dates:  Health Maintenance  Topic Date Due   Complete foot exam   05/15/2021   Pap Smear  05/31/2022   COVID-19 Vaccine (7 - 2023-24 season) 04/14/2023   Eye exam for diabetics  08/13/2023   Hemoglobin A1C  08/18/2023   Mammogram  10/20/2023   Yearly kidney function blood test for diabetes  02/17/2024   Yearly kidney health urinalysis for diabetes  02/17/2024   Cologuard (Stool DNA test)  03/16/2024   Medicare Annual Wellness Visit  03/18/2024   DTaP/Tdap/Td vaccine (4 - Td or Tdap) 01/07/2032   Flu Shot  Completed   Hepatitis C Screening  Completed   HIV Screening  Completed   HPV Vaccine  Aged Out    Advanced directives: (Declined) Advance directive discussed with you today. Even though you declined this today, please call our office should you change your mind, and we can give you the proper paperwork for you to fill out.  Next Medicare Annual Wellness Visit scheduled for next year: Yes

## 2023-03-30 DIAGNOSIS — F250 Schizoaffective disorder, bipolar type: Secondary | ICD-10-CM | POA: Diagnosis not present

## 2023-03-30 DIAGNOSIS — F70 Mild intellectual disabilities: Secondary | ICD-10-CM | POA: Diagnosis not present

## 2023-04-07 ENCOUNTER — Encounter: Payer: Self-pay | Admitting: Neurology

## 2023-04-07 ENCOUNTER — Ambulatory Visit (INDEPENDENT_AMBULATORY_CARE_PROVIDER_SITE_OTHER): Payer: Medicare Other | Admitting: Neurology

## 2023-04-07 VITALS — BP 118/70 | Ht 65.0 in | Wt 123.0 lb

## 2023-04-07 DIAGNOSIS — F209 Schizophrenia, unspecified: Secondary | ICD-10-CM | POA: Diagnosis not present

## 2023-04-07 DIAGNOSIS — R569 Unspecified convulsions: Secondary | ICD-10-CM | POA: Diagnosis not present

## 2023-04-07 NOTE — Progress Notes (Signed)
[facility]  PATIENT: [name] DOB: 20-Jul-1975  REQUESTING CLINICIAN: Latrelle Dodrill, MD HISTORY FROM: Patient and [name] REASON FOR VISIT: Seizure like activity    HISTORICAL  CHIEF COMPLAINT:  Chief Complaint  Patient presents with   Follow-up    Rm 13, with caregiver [name], no sz since last visit, no missed medications, denies SI/HI, she bangs head against wall.    INTERVAL HISTORY 04/07/2023:  Patient presents today for follow-up, she is accompanied by her caregiver [name].  Last visit was in a year ago.  Since then, [name] denies any seizure but patient continued to have worsening behavior.  Again while they are in public, at the day program or at church she will scream, she will lash out, tremble, she continued to stated the demons made her do it.  She has tried multiple antipsychotics and [name] is looking into a different placement for patient.  For her seizure-like activity I have prescribed her Trileptal 300 mg twice daily but since these are nonepileptic events and purely behavior, will check with psychiatrist if okay to discontinue Trileptal.   INTERVAL HISTORY 04/08/22:  [name] presents today for follow-up, she is accompanied by her aide [name].  Since last visit [name] denies any seizure or seizure-like activity.  She however reports worsening behavior.  She will scream, lashes out, tremble, and when asked [name] will mention that demons, the voices are making her do it.  She did follow-up with a psychiatrist who noted that these events are volitional, purely behavioral.  No seizures, she is compliant with the trileptal 300 mg BID.    INTERVAL HISTORY 12/05/2021: [name] presents today for follow-up, she is accompanied by her new therapeutic provider [name].  At last visit in May plan was to continue with Depakote 2000 mg nightly.  [name] reported that patient medications, notably the Seroquel and the Depakote was discontinued on  July 6 due to possible movement disorder, and patient was started on Ingrezza injection.  On July 24 she was witnessed to have 2 generalized tonic-clonic seizures. She was taken to the ED, patient was started on Keppra 500 mg twice daily.  Caregiver also reports that prior to discontinuation of Depakote, patient had stopped taking it stating that the tablets are too large to swallow.  Caregiver reports that since starting Keppra patient has been having excessive drooling, she is very loopy, she sleeps all day and she has noted that patient is noncompliant and very defiant.  Her behavior got worse, she is having BM at night on her bed and smear the feces on the wall.  She also had lost weight.   INTERVAL HISTORY 10/02/2021:  Patient presents today for follow-up, she is accompanied by her caregiver [name].  Last visit was in February 28, since last visit no additional seizure.  Patient reports that sometimes she can feel the seizures coming then she starts shaking. Caregiver reports during this time patient is alert and responsive.  No full tonic-clonic activity noted.  She is compliant with the Depakote 2000 mg at nighttime.  Caregiver also reported she has used Valtoco once due to ongoing shaking.    INTERVAL HISTORY 07/09/2021:  Patient presents today for follow-up, she is accompanied by her caregiver.  She presented to the ED on February 22 for another seizure.  Per caregiver who witnessed the event, patient extended arm and then fell backward she was noted to shake, foaming at the mouth, no urinary incontinence.  Episode lasted about 1 to 2-minutes with  postictal confusion.  EMS was called and she presented to the ED.  In the ED her Depakote level was normal at 66. She was back to her normal self, discharge without changes to her ASM.    HISTORY OF PRESENT ILLNESS:  This is a 47 year old woman with past medical history of schizophrenia, cognitive impairment, and diabetes mellitus who is presenting after  being admitted to the hospital for seizure-like activity.  Patient was initially admitted from November 9 to November 14 after being witnessed to have seizure-like activity at her nursing home.  EMS was called and she was given 5 mg of Versed.  She was taken to the ED, EEG was negative for seizures but showed mild generalized slowing.  At that time she was also noted to be COVID-positive.  No other abnormal movement noted.  Her Depakote was increased to 1250.  Patient was discharged home then presented again to the ED on November 21 for seizure-like activity, again it was described as she was shaking, unresponsive, EMS was called and again she was given Versed 5 mg and sent to the ED.  In the ED, her Depakote level was checked and was therapeutic and she was discharged home without any increase in her antiseizure medication.  Since being back to the nursing home, aide reports that she does have occasional seizure-like activity, she provided a video when you see patient shaking her head and was able to stop it upon command.  Denies any urinary incontinence no injuries, no tongue biting.   Handedness: Right   Seizure Type: Described as shaking   Current frequency: weekly   Any injuries from seizures: None   Seizure risk factors: Schizophrenia, otherwise none reported   Previous ASMs: Depakote but for mood, Keppra (worsening behavior)    Currenty ASMs: Trileptal   ASMs side effects: None   Brain Images: within normal limits   Previous EEGs: Mild generalized slowing, no seizures, no interictal discharges   OTHER MEDICAL CONDITIONS: Schizophrenia, cognitive impairment, HTN, DMII, HLD  REVIEW OF SYSTEMS: Full 14 system review of systems performed and negative with exception of: unable to obtain   ALLERGIES: Allergies  Allergen Reactions   Minocycline     Unknown     HOME MEDICATIONS: Outpatient Medications Prior to Visit  Medication Sig Dispense Refill   ABILIFY MAINTENA 400 MG PRSY  prefilled syringe SMARTSIG:1 Each IM Every 4 Weeks     ARIPiprazole (ABILIFY) 10 MG tablet Take 10 mg by mouth daily.     aspirin (GNP ADULT ASPIRIN LOW STRENGTH) 81 MG chewable tablet CHEW 1 TABLET DAILY 90 tablet 3   atorvastatin (LIPITOR) 20 MG tablet Take 1 tablet (20 mg total) by mouth at bedtime. 90 tablet 3   benztropine (COGENTIN) 1 MG tablet Take 1 mg by mouth daily.     divalproex (DEPAKOTE) 250 MG DR tablet Take by mouth. 2 tablets in AM, 1 at noon, and 2 at night     empagliflozin (JARDIANCE) 10 MG TABS tablet Take 1 tablet (10 mg total) by mouth daily. 90 tablet 3   famotidine (PEPCID) 20 MG tablet Take 1 tablet (20 mg total) by mouth 2 (two) times daily. 180 tablet 3   glucose blood (ONETOUCH VERIO) test strip Use as instructed- patient to test her glucose once daily 100 each 3   Lancets (ONETOUCH DELICA PLUS LANCET30G) MISC check blood sugar ONCE DAILY 100 each 3   LORazepam (ATIVAN) 1 MG tablet Take 1 mg by mouth daily. 1mg  at  noon, 2mg  at bedtime     losartan (COZAAR) 25 MG tablet Take 0.5 tablets (12.5 mg total) by mouth daily. 45 tablet 3   medroxyPROGESTERone (DEPO-PROVERA) 150 MG/ML injection Inject 150 mg into the muscle every 3 (three) months.     Multiple Vitamins-Minerals (CENTRUM ADULTS) TABS Take 1 tablet by mouth daily. 90 tablet 3   Oxcarbazepine (TRILEPTAL) 300 MG tablet Take 1 tablet (300 mg total) by mouth 2 (two) times daily. 180 tablet 4   PARoxetine (PAXIL) 20 MG tablet Take 20 mg by mouth daily.     polyethylene glycol powder (GLYCOLAX/MIRALAX) 17 GM/SCOOP powder Take 17g daily as needed for constipation 510 g 1   QUEtiapine (SEROQUEL) 50 MG tablet Take 50 mg by mouth at bedtime.     valbenazine (INGREZZA) 40 MG capsule Take 40 mg by mouth at bedtime.     No facility-administered medications prior to visit.    PAST MEDICAL HISTORY: Past Medical History:  Diagnosis Date   Acne    Anemia    Cognitive impairment    Diabetes mellitus without complication  (HCC)    Internal hemorrhoid, bleeding 10/22/2010   Obesity    Schizoaffective disorder    SVT (supraventricular tachycardia) (HCC)    with short PR interval - followed by Dr. Ladona Ridgel    PAST SURGICAL HISTORY: Past Surgical History:  Procedure Laterality Date   BREAST REDUCTION SURGERY  08/10/2005   HYSTEROSCOPY  09/09/2004   w/ removal of polyps   KIDNEY SURGERY Left 05/17/2020   Tumor removal by Dr. Charlyn Minerva   REDUCTION MAMMAPLASTY Bilateral 2007   SPINE SURGERY N/A 04/18/2020   Dr. Lovell Sheehan - pinched nerve of Lumbar Spine    FAMILY HISTORY: Family History  Problem Relation Age of Onset   Schizophrenia Sister     SOCIAL HISTORY: Social History   Socioeconomic History   Marital status: Single    Spouse name: Not on file   Number of children: 0   Years of education: 10 years   Highest education level: 10th grade  Occupational History   Occupation: Disabled  Tobacco Use   Smoking status: Never    Passive exposure: Never   Smokeless tobacco: Never  Vaping Use   Vaping status: Never Used  Substance and Sexual Activity   Alcohol use: No   Drug use: No   Sexual activity: Never    Birth control/protection: Injection  Other Topics Concern   Not on file  Social History Narrative   Lives with caregiver Kennith Center and her children. [phone].      Previously lived in group home. Twin sister with similar problem list. Has 24H care. Case manager is Ewell Poe, [phone].      Caregiver works for [employer]. [address] [place], [place] [address].       Guardian is Trinna Balloon of [employer] [place]., [phone] office. After hours service [phone].      Patient is enjoying going to church and bible study. She is active in [employer].    Social Determinants of Health   Financial Resource Strain: Low Risk  (03/19/2023)   Overall Financial Resource Strain (CARDIA)    Difficulty of Paying Living Expenses: Not hard at all   Food Insecurity: No Food Insecurity (03/19/2023)   Hunger Vital Sign    Worried About Running Out of Food in the Last Year: Never true    Ran Out of Food in the Last Year: Never true  Transportation  Needs: No Transportation Needs (03/19/2023)   PRAPARE - Administrator, Civil Service (Medical): No    Lack of Transportation (Non-Medical): No  Physical Activity: Patient Unable To Answer (03/19/2023)   Exercise Vital Sign    Days of Exercise per Week: Patient unable to answer    Minutes of Exercise per Session: Patient unable to answer  Stress: No Stress Concern Present (03/19/2023)   [employer] of Occupational Health - Occupational Stress Questionnaire    Feeling of Stress : Only a little  Social Connections: Moderately Isolated (03/19/2023)   Social Connection and Isolation Panel [NHANES]    Frequency of Communication with Friends and Family: More than three times a week    Frequency of Social Gatherings with Friends and Family: More than three times a week    Attends Religious Services: More than 4 times per year    Active Member of [employer] or Organizations: No    Attends Banker Meetings: Never    Marital Status: Never married  Intimate Partner Violence: Not At Risk (03/19/2023)   Humiliation, Afraid, Rape, and Kick questionnaire    Fear of Current or Ex-Partner: No    Emotionally Abused: No    Physically Abused: No    Sexually Abused: No     PHYSICAL EXAM  GENERAL EXAM/CONSTITUTIONAL: Vitals:  Vitals:   04/07/23 1446  BP: 118/70  Weight: 123 lb (55.8 kg)  Height: 5\' 5"  (1.651 m)   Body mass index is 20.47 kg/m. Wt Readings from Last 3 Encounters:  04/07/23 123 lb (55.8 kg)  03/19/23 130 lb (59 kg)  03/17/23 121 lb (54.9 kg)   Patient is in no distress; well developed, nourished and groomed; neck is supple  MUSCULOSKELETAL: Gait, strength, tone, movements noted in Neurologic exam below  NEUROLOGIC: MENTAL STATUS:     03/19/2023     8:52 AM  MMSE - Mini Mental State Exam  Not completed: Unable to complete   awake, alert, oriented to person, place and time Able to follow simple commands  There is psychomotor retardation.    CRANIAL NERVE:  2nd, 3rd, 4th, 6th - visual fields full to confrontation, extraocular muscles intact, no nystagmus 5th - facial sensation symmetric 7th - facial strength symmetric 8th - hearing intact 9th - palate elevates symmetrically, uvula midline 11th - shoulder shrug symmetric 12th - tongue protrusion midline  MOTOR:  normal bulk and tone, full strength in the BUE, BLE. Noted to have a tremors on exam but tremors is suppressible   REFLEXES:  deep tendon reflexes present and symmetric  GAIT/STATION:  Wide based, short stride, mildly unsteady    DIAGNOSTIC DATA (LABS, IMAGING, TESTING) - I reviewed patient records, labs, notes, testing and imaging myself where available.  Lab Results  Component Value Date   WBC 6.0 02/17/2023   HGB 12.9 02/17/2023   HCT 40.6 02/17/2023   MCV 90 02/17/2023   PLT 188 02/17/2023      Component Value Date/Time   NA 148 (H) 02/17/2023 1215   K 4.4 02/17/2023 1215   CL 108 (H) 02/17/2023 1215   CO2 23 02/17/2023 1215   GLUCOSE 170 (H) 02/17/2023 1215   GLUCOSE 194 (H) 12/02/2021 1620   BUN 28 (H) 02/17/2023 1215   CREATININE 2.08 (H) 02/17/2023 1215   CREATININE 1.79 (H) 06/23/2016 1025   CALCIUM 10.0 02/17/2023 1215   PROT 6.1 (L) 12/02/2021 1612   PROT 6.6 02/28/2020 1207   ALBUMIN 2.8 (L) 12/02/2021 1612  ALBUMIN 4.0 02/28/2020 1207   AST 28 12/02/2021 1612   ALT 18 12/02/2021 1612   ALKPHOS 92 12/02/2021 1612   BILITOT 0.2 (L) 12/02/2021 1612   BILITOT 0.3 02/28/2020 1207   GFRNONAA 24 (L) 12/02/2021 1612   GFRNONAA 34 (L) 06/23/2016 1025   GFRAA 47 (L) 02/28/2020 1207   GFRAA 40 (L) 06/23/2016 1025   Lab Results  Component Value Date   CHOL 108 02/17/2023   HDL 45 02/17/2023   LDLCALC 51 02/17/2023   LDLDIRECT 60  04/02/2012   TRIG 50 02/17/2023   Lab Results  Component Value Date   HGBA1C 6.1 02/17/2023   Lab Results  Component Value Date   VITAMINB12 1,459 (H) 02/17/2023   Lab Results  Component Value Date   TSH 1.049 11/15/2021    Depakote level 11/21: 73  Depakote level 2/22: 66   MRI Brain with and without contrast 03/20/2021 No acute intracranial process.  No evidence of brain mass or lesion   EEG 03/21/2021: This study is suggestive of mild diffuse encephalopathy, nonspecific etiology. No seizures or epileptiform discharges were seen throughout the recording.   Routine EEG 07/16/21:  This is an abnormal EEG recording in the waking and drowsy state due to diffuse slowing. Diffuse slowing is consistent with a generalized brain dysfunction such as in encephalopathy.    ASSESSMENT AND PLAN  47 y.o. year old female  with past medical history of schizophrenia, cognitive impairment, hypertension, hyperlipidemia, and diabetes mellitus who is presenting for seizure like events follow up.  Her events are deemed to be behavior, hence nonepileptic.  She continues to have worsening behavior problem despite trying multiple antipsychotic and at high doses, lashing out, screaming, hitting her head but no true epileptic seizures.  I have started her in the past on Trileptal 300 mg twice daily but since these events are nonepileptic, will check with her psychiatrist if she is agreeable and discontinue the Trileptal as Trileptal can also help with behavior.  Will update chart once we get an answer.  For now we will discharge from practice, advised them to follow-up with PCP and return as needed or for any other current problem.     1. Nonepileptic episode (HCC)   2. Schizophrenia, unspecified type [facility])       Patient Instructions  Continue current medications  Continue to follow up with PCP and Psychiatry  Return as needed     ADDENDUM:  Per patient psychiatrist, ok to discontinue  Oxcarbazepine. I started the medication for seizure purposes but since she has nonepileptic events, will discontinue it.   Windell Norfolk, MD 04/08/2023 at 1355  Per [facility] statutes, patients with seizures are not allowed to drive until they have been seizure-free for six months.  Other recommendations include using caution when using heavy equipment or power tools. Avoid working on ladders or at heights. Take showers instead of baths.  Do not swim alone.  Ensure the water temperature is not too high on the home water heater. Do not go swimming alone. Do not lock yourself in a room alone (i.e. bathroom). When caring for infants or small children, sit down when holding, feeding, or changing them to minimize risk of injury to the child in the event you have a seizure. Maintain good sleep hygiene. Avoid alcohol.  Also recommend adequate sleep, hydration, good diet and minimize stress.   During the Seizure  - First, ensure adequate ventilation and place patients on the floor on their left  side  Loosen clothing around the neck and ensure the airway is patent. If the patient is clenching the teeth, do not force the mouth open with any object as this can cause severe damage - Remove all items from the surrounding that can be hazardous. The patient may be oblivious to what's happening and may not even know what he or she is doing. If the patient is confused and wandering, either gently guide him/her away and block access to outside areas - Reassure the individual and be comforting - Call 911. In most cases, the seizure ends before EMS arrives. However, there are cases when seizures may last over 3 to 5 minutes. Or the individual may have developed breathing difficulties or severe injuries. If a pregnant patient or a person with diabetes develops a seizure, it is prudent to call an ambulance. - Finally, if the patient does not regain full consciousness, then call EMS. Most patients will remain  confused for about 45 to 90 minutes after a seizure, so you must use judgment in calling for help. - Avoid restraints but make sure the patient is in a bed with padded side rails - Place the individual in a lateral position with the neck slightly flexed; this will help the saliva drain from the mouth and prevent the tongue from falling backward - Remove all nearby furniture and other hazards from the area - Provide verbal assurance as the individual is regaining consciousness - Provide the patient with privacy if possible - Call for help and start treatment as ordered by the caregiver   After the Seizure (Postictal Stage)  After a seizure, most patients experience confusion, fatigue, muscle pain and/or a headache. Thus, one should permit the individual to sleep. For the next few days, reassurance is essential. Being calm and helping reorient the person is also of importance.  Most seizures are painless and end spontaneously. Seizures are not harmful to others but can lead to complications such as stress on the lungs, brain and the heart. Individuals with prior lung problems may develop labored breathing and respiratory distress.     No orders of the defined types were placed in this encounter.   No orders of the defined types were placed in this encounter.   Return if symptoms worsen or fail to improve.  I have spent a total of 35 minutes dedicated to this patient today, preparing to see patient, performing a medically appropriate examination and evaluation, ordering tests and/or medications and procedures, and counseling and educating the patient/family/caregiver; independently interpreting result and communicating results to the family/patient/caregiver; and documenting clinical information in the electronic medical record.   Windell Norfolk, MD 04/07/2023, 5:22 PM  [facility] [address] [place], [place] [address] [phone]

## 2023-04-07 NOTE — Patient Instructions (Signed)
Continue current medications  Continue to follow up with PCP and Psychiatry  Return as needed

## 2023-04-08 ENCOUNTER — Telehealth: Payer: Self-pay | Admitting: Neurology

## 2023-04-08 ENCOUNTER — Other Ambulatory Visit: Payer: Self-pay | Admitting: Neurology

## 2023-04-08 DIAGNOSIS — F250 Schizoaffective disorder, bipolar type: Secondary | ICD-10-CM | POA: Diagnosis not present

## 2023-04-08 DIAGNOSIS — F70 Mild intellectual disabilities: Secondary | ICD-10-CM | POA: Diagnosis not present

## 2023-04-08 NOTE — Telephone Encounter (Signed)
Noted and will discontinue and inform Dr. Teresa Coombs

## 2023-04-08 NOTE — Telephone Encounter (Signed)
Richardson,[name] (pt's therapeutic provider )reports that it is ok to D/C pt's  Oxcarbazepine (TRILEPTAL) 300 MG tablet with an effective date of today, she has spoken with pt's psychiatrist.  If there are questions Richardson,[name] vm is secure at [phone]

## 2023-04-08 NOTE — Telephone Encounter (Signed)
Thanks

## 2023-04-08 NOTE — Addendum Note (Signed)
Addended byWindell Norfolk on: 04/08/2023 01:55 PM   Modules accepted: Orders

## 2023-05-01 DIAGNOSIS — F70 Mild intellectual disabilities: Secondary | ICD-10-CM | POA: Diagnosis not present

## 2023-05-01 DIAGNOSIS — F250 Schizoaffective disorder, bipolar type: Secondary | ICD-10-CM | POA: Diagnosis not present

## 2023-05-05 ENCOUNTER — Ambulatory Visit: Payer: Medicare Other

## 2023-05-07 ENCOUNTER — Ambulatory Visit: Payer: Medicare Other

## 2023-05-07 DIAGNOSIS — Z3042 Encounter for surveillance of injectable contraceptive: Secondary | ICD-10-CM

## 2023-05-07 DIAGNOSIS — Z30019 Encounter for initial prescription of contraceptives, unspecified: Secondary | ICD-10-CM

## 2023-05-07 MED ORDER — MEDROXYPROGESTERONE ACETATE 150 MG/ML IM SUSP
150.0000 mg | Freq: Once | INTRAMUSCULAR | Status: AC
Start: 2023-05-07 — End: 2023-05-07
  Administered 2023-05-07: 150 mg via INTRAMUSCULAR

## 2023-05-07 NOTE — Progress Notes (Signed)
Patient here today for Depo Provera injection and is within her dates.    Last contraceptive appt was 02/17/2023.  Depo given in RD today.  Site unremarkable & patient tolerated injection.    Next injection due 07/23/2023-08/06/2023. Reminder card given.    Apt schedule with PCP for 6 month FU and depo injection for 07/28/2023.

## 2023-05-08 ENCOUNTER — Other Ambulatory Visit: Payer: Self-pay | Admitting: Family Medicine

## 2023-05-08 DIAGNOSIS — Z1231 Encounter for screening mammogram for malignant neoplasm of breast: Secondary | ICD-10-CM

## 2023-07-28 ENCOUNTER — Ambulatory Visit: Payer: Medicare Other | Admitting: Family Medicine

## 2023-08-03 ENCOUNTER — Encounter: Payer: Self-pay | Admitting: Family Medicine

## 2023-08-03 ENCOUNTER — Ambulatory Visit (INDEPENDENT_AMBULATORY_CARE_PROVIDER_SITE_OTHER): Payer: Medicare Other | Admitting: Family Medicine

## 2023-08-03 VITALS — BP 109/79 | HR 91 | Ht 64.0 in | Wt 122.8 lb

## 2023-08-03 DIAGNOSIS — Z30019 Encounter for initial prescription of contraceptives, unspecified: Secondary | ICD-10-CM | POA: Diagnosis not present

## 2023-08-03 DIAGNOSIS — N183 Chronic kidney disease, stage 3 unspecified: Secondary | ICD-10-CM | POA: Diagnosis not present

## 2023-08-03 DIAGNOSIS — K5909 Other constipation: Secondary | ICD-10-CM | POA: Diagnosis not present

## 2023-08-03 DIAGNOSIS — E1122 Type 2 diabetes mellitus with diabetic chronic kidney disease: Secondary | ICD-10-CM | POA: Diagnosis present

## 2023-08-03 DIAGNOSIS — N1831 Chronic kidney disease, stage 3a: Secondary | ICD-10-CM

## 2023-08-03 DIAGNOSIS — Z0000 Encounter for general adult medical examination without abnormal findings: Secondary | ICD-10-CM

## 2023-08-03 DIAGNOSIS — Z3042 Encounter for surveillance of injectable contraceptive: Secondary | ICD-10-CM

## 2023-08-03 DIAGNOSIS — Z3200 Encounter for pregnancy test, result unknown: Secondary | ICD-10-CM

## 2023-08-03 DIAGNOSIS — F209 Schizophrenia, unspecified: Secondary | ICD-10-CM

## 2023-08-03 LAB — POCT GLYCOSYLATED HEMOGLOBIN (HGB A1C): HbA1c, POC (controlled diabetic range): 6.1 % (ref 0.0–7.0)

## 2023-08-03 LAB — POCT URINE PREGNANCY: Preg Test, Ur: NEGATIVE

## 2023-08-03 MED ORDER — LOSARTAN POTASSIUM 25 MG PO TABS: 12.5000 mg | ORAL_TABLET | Freq: Every day | ORAL | 3 refills | Status: DC

## 2023-08-03 MED ORDER — MEDROXYPROGESTERONE ACETATE 150 MG/ML IM SUSP
150.0000 mg | Freq: Once | INTRAMUSCULAR | Status: AC
  Administered 2023-08-03: 150 mg via INTRAMUSCULAR

## 2023-08-03 MED ORDER — ASPIRIN 81 MG PO CHEW: CHEWABLE_TABLET | ORAL | 3 refills | Status: AC

## 2023-08-03 MED ORDER — CENTRUM ADULTS PO TABS: 1.0000 | ORAL_TABLET | Freq: Every day | ORAL | 3 refills | Status: AC

## 2023-08-03 MED ORDER — ONETOUCH VERIO VI STRP: ORAL_STRIP | 3 refills | Status: DC

## 2023-08-03 MED ORDER — ONETOUCH DELICA PLUS LANCET30G MISC: 3 refills | Status: DC

## 2023-08-03 MED ORDER — EMPAGLIFLOZIN 10 MG PO TABS: 10.0000 mg | ORAL_TABLET | Freq: Every day | ORAL | 3 refills | Status: DC

## 2023-08-03 MED ORDER — FAMOTIDINE 20 MG PO TABS: 20.0000 mg | ORAL_TABLET | Freq: Two times a day (BID) | ORAL | 3 refills | Status: AC

## 2023-08-03 MED ORDER — ATORVASTATIN CALCIUM 20 MG PO TABS: 20.0000 mg | ORAL_TABLET | Freq: Every day | ORAL | 3 refills | Status: AC

## 2023-08-03 NOTE — Assessment & Plan Note (Signed)
 Depo today, follow-up in 3 months for next Depo shot

## 2023-08-03 NOTE — Progress Notes (Signed)
  Date of Visit: 08/03/2023   SUBJECTIVE:   HPI:  [name] presents today for routine follow-up.  She is accompanied by her therapeutic provider.  Diabetes: Currently taking Jardiance 10 mg daily.  Tolerating this well.  A1c today is 6.1.  Fasting sugars are all excellent.  GERD: Taking famotidine 20 mg twice a day.  Never complains about stomach issues according to caregiver.  Has not taken MiraLAX in over a month, caregiver requesting discontinuation of this medication today.  Schizophrenia: Continues to follow-up with psychiatry.  They have made adjustments to her medicines recently and she is doing quite well on these meds.  Menstrual management: Due for Depo today.  Not sexually active.   OBJECTIVE:   BP 109/79   Pulse 91   Ht 5\' 4"  (1.626 m)   Wt 122 lb 12.8 oz (55.7 kg)   SpO2 100%   BMI 21.08 kg/m  Gen: No acute rest, pleasant, cooperative, well-appearing HEENT: Normocephalic, atraumatic Heart: Regular rate and rhythm, no murmur Lungs: Clear bilaterally, normal effort Abdomen: Soft, nontender to palpation, no masses Neuro: Grossly nonfocal, speech normal Ext: No edema  ASSESSMENT/PLAN:   Assessment & Plan Type 2 diabetes mellitus with stage 3 chronic kidney disease, without long-term current use of insulin, unspecified whether stage 3a or 3b CKD (HCC) Well-controlled with A1c of 6.1.  Continue current regimen with Jardiance 10 mg daily.  Follow-up in 6 months for next A1c.  Updating renal function today. Schizophrenia, unspecified type [facility]) Doing well with medications as adjusted by psychiatry. CONSTIPATION, CHRONIC Doing well without needing MiraLAX.  Discontinued this order today.  Could restart in the future if needed. Encounter for surveillance of injectable contraceptive Depo today, follow-up in 3 months for next Depo shot Routine adult health maintenance - Discussed she is due for Pap smear.  Previously attempted Pap smear at GYN office and patient refused.   Has historically been very difficult and traumatic for her to get pelvic exams here, so I would not reattempt, would defer this to GYN office if it is decided in the future that she wants to try it.  Not sexually active, low risk for contracting HPV.  Continue to address risk/benefits at future visits. -Has mammogram scheduled    FOLLOW UP: Follow up in 6 months for next A1c  Jacqueline J. Pollie Meyer, MD [facility]

## 2023-08-03 NOTE — Assessment & Plan Note (Signed)
 Doing well without needing MiraLAX.  Discontinued this order today.  Could restart in the future if needed.

## 2023-08-03 NOTE — Patient Instructions (Signed)
 It was great to see you again today.  Stop miralax  A1c looks great  Checking kidney function today  Follow up in 6 months for next A1c, sooner if needed  Be well, Dr. Pollie Meyer

## 2023-08-03 NOTE — Assessment & Plan Note (Signed)
 Doing well with medications as adjusted by psychiatry.

## 2023-08-03 NOTE — Assessment & Plan Note (Signed)
-   Discussed she is due for Pap smear.  Previously attempted Pap smear at GYN office and patient refused.  Has historically been very difficult and traumatic for her to get pelvic exams here, so I would not reattempt, would defer this to GYN office if it is decided in the future that she wants to try it.  Not sexually active, low risk for contracting HPV.  Continue to address risk/benefits at future visits. -Has mammogram scheduled

## 2023-08-03 NOTE — Assessment & Plan Note (Signed)
 Well-controlled with A1c of 6.1.  Continue current regimen with Jardiance 10 mg daily.  Follow-up in 6 months for next A1c.  Updating renal function today.

## 2023-08-04 ENCOUNTER — Encounter: Payer: Self-pay | Admitting: Family Medicine

## 2023-08-04 LAB — BASIC METABOLIC PANEL
BUN/Creatinine Ratio: 11 (ref 9–23)
BUN: 22 mg/dL (ref 6–24)
CO2: 22 mmol/L (ref 20–29)
Calcium: 9.2 mg/dL (ref 8.7–10.2)
Chloride: 109 mmol/L — ABNORMAL HIGH (ref 96–106)
Creatinine, Ser: 1.94 mg/dL — ABNORMAL HIGH (ref 0.57–1.00)
Glucose: 91 mg/dL (ref 70–99)
Potassium: 4.2 mmol/L (ref 3.5–5.2)
Sodium: 145 mmol/L — ABNORMAL HIGH (ref 134–144)
eGFR: 31 mL/min/{1.73_m2} — ABNORMAL LOW (ref >59)

## 2023-08-05 NOTE — Telephone Encounter (Signed)
error 

## 2023-09-03 LAB — OPHTHALMOLOGY REPORT-SCANNED

## 2023-10-19 ENCOUNTER — Ambulatory Visit: Admitting: Family Medicine

## 2023-10-19 ENCOUNTER — Encounter: Payer: Self-pay | Admitting: Family Medicine

## 2023-10-19 VITALS — BP 110/62 | HR 95 | Ht 64.0 in | Wt 130.2 lb

## 2023-10-19 DIAGNOSIS — N183 Chronic kidney disease, stage 3 unspecified: Secondary | ICD-10-CM

## 2023-10-19 DIAGNOSIS — N643 Galactorrhea not associated with childbirth: Secondary | ICD-10-CM

## 2023-10-19 DIAGNOSIS — E1122 Type 2 diabetes mellitus with diabetic chronic kidney disease: Secondary | ICD-10-CM | POA: Diagnosis not present

## 2023-10-19 DIAGNOSIS — Z309 Encounter for contraceptive management, unspecified: Secondary | ICD-10-CM | POA: Diagnosis not present

## 2023-10-19 DIAGNOSIS — Z0000 Encounter for general adult medical examination without abnormal findings: Secondary | ICD-10-CM

## 2023-10-19 MED ORDER — MEDROXYPROGESTERONE ACETATE 150 MG/ML IM SUSY
150.0000 mg | PREFILLED_SYRINGE | Freq: Once | INTRAMUSCULAR | Status: AC
  Administered 2023-10-19: 150 mg via INTRAMUSCULAR

## 2023-10-19 NOTE — Patient Instructions (Addendum)
 It was great to see you again today.  Checking bloodwork today to evaluate causes of leaking from nipples when squeezed - suspect this is side effect from medication  Depo today  Follow up in 3 months for next A1c  Be well, Dr. Dawn Eth

## 2023-10-19 NOTE — Progress Notes (Signed)
  Date of Visit: 10/19/2023   SUBJECTIVE:   HPI:  [name] presents today for a well woman exam. Accompanied by her caregiver  Concerns today: discharge from nipples when squeezing Periods: none, on depo Contraception: depo, not sexually active Pap smear status: past due but has not tolerated exams in past, refuses Exercise: not much Diet: tries to eat healthy Smoking: no Alcohol: no Drugs: no Mood: sees psych NP who manages all psychiatric conditions Dentist: goes twice a year  OBJECTIVE:   BP 110/62   Pulse 95   Ht 5\' 4"  (1.626 m)   Wt 130 lb 3.2 oz (59.1 kg)   SpO2 98%   BMI 22.35 kg/m  Gen: NAD, pleasant, cooperative HEENT: NCAT, PERRL, no palpable thyromegaly or anterior cervical lymphadenopathy Heart: RRR, no murmurs Lungs: CTAB, normal work of breathing Breasts: bilateral breasts normal in appearance. No erythema or deformity. Patient able to express very small amount of clear fluid via multiple ductal openings bilaterally. No palpable abnormal masses. No axillary lymphadenopathy.  Abdomen: soft, nontender to palpation Neuro: grossly nonfocal, speech normal   ASSESSMENT/PLAN:   Assessment & Plan Routine adult health maintenance -STD screening: not sexually active -pap smear: has declined this due to discomfort, not sexually active so low risk for HPV infection -mammogram: scheduled upcoming -lipid screening: UTD -colon cancer screening: UTD -immunizations:  Flu: UTD Tdap: UTD Shingrix: discussed with caregiver, she will discuss with patient's guardian for consent Pneumovax: UTD -forms completed for her caregiver (standing orders, physical form) Galactorrhea Bilateral small amount of clear fluid expressible from multiple ducts on both nipples - likely physiologic Check prolactin, TSH, BMET today If PRL elevated, it is likely side effect of her psychiatric medications which are greatly needed  FOLLOW UP: Follow up in 3 mos for next A1c  [place] J.  Dawn Eth, MD [facility]

## 2023-10-19 NOTE — Assessment & Plan Note (Signed)
-  STD screening: not sexually active -pap smear: has declined this due to discomfort, not sexually active so low risk for HPV infection -mammogram: scheduled upcoming -lipid screening: UTD -colon cancer screening: UTD -immunizations:  Flu: UTD Tdap: UTD Shingrix: discussed with caregiver, she will discuss with patient's guardian for consent Pneumovax: UTD -forms completed for her caregiver (standing orders, physical form)

## 2023-10-20 ENCOUNTER — Ambulatory Visit: Payer: Self-pay | Admitting: Family Medicine

## 2023-10-20 LAB — BASIC METABOLIC PANEL WITH GFR
BUN/Creatinine Ratio: 15 (ref 9–23)
BUN: 32 mg/dL — ABNORMAL HIGH (ref 6–24)
CO2: 20 mmol/L (ref 20–29)
Calcium: 9.5 mg/dL (ref 8.7–10.2)
Chloride: 109 mmol/L — ABNORMAL HIGH (ref 96–106)
Creatinine, Ser: 2.18 mg/dL — ABNORMAL HIGH (ref 0.57–1.00)
Glucose: 63 mg/dL — ABNORMAL LOW (ref 70–99)
Potassium: 4.4 mmol/L (ref 3.5–5.2)
Sodium: 145 mmol/L — ABNORMAL HIGH (ref 134–144)
eGFR: 27 mL/min/{1.73_m2} — ABNORMAL LOW (ref >59)

## 2023-10-20 LAB — TSH RFX ON ABNORMAL TO FREE T4: TSH: 0.671 u[IU]/mL (ref 0.450–4.500)

## 2023-10-20 LAB — PROLACTIN: Prolactin: 172 ng/mL — ABNORMAL HIGH (ref 4.8–33.4)

## 2023-10-26 ENCOUNTER — Ambulatory Visit
Admission: RE | Admit: 2023-10-26 | Discharge: 2023-10-26 | Disposition: A | Discharge status: Home / Self Care | Payer: Medicare Other | Source: Ambulatory Visit | Attending: Family Medicine | Admitting: Family Medicine

## 2023-10-26 DIAGNOSIS — Z1231 Encounter for screening mammogram for malignant neoplasm of breast: Secondary | ICD-10-CM

## 2024-01-07 ENCOUNTER — Ambulatory Visit: Admitting: Family Medicine

## 2024-01-07 ENCOUNTER — Encounter: Payer: Self-pay | Admitting: Family Medicine

## 2024-01-07 VITALS — BP 115/79 | HR 97 | Ht 64.0 in | Wt 136.8 lb

## 2024-01-07 DIAGNOSIS — N1831 Chronic kidney disease, stage 3a: Secondary | ICD-10-CM

## 2024-01-07 DIAGNOSIS — Z309 Encounter for contraceptive management, unspecified: Secondary | ICD-10-CM

## 2024-01-07 DIAGNOSIS — Z0000 Encounter for general adult medical examination without abnormal findings: Secondary | ICD-10-CM | POA: Diagnosis not present

## 2024-01-07 DIAGNOSIS — E1122 Type 2 diabetes mellitus with diabetic chronic kidney disease: Secondary | ICD-10-CM

## 2024-01-07 DIAGNOSIS — F209 Schizophrenia, unspecified: Secondary | ICD-10-CM | POA: Diagnosis not present

## 2024-01-07 LAB — POCT GLYCOSYLATED HEMOGLOBIN (HGB A1C): HbA1c, POC (controlled diabetic range): 5.7 % (ref 0.0–7.0)

## 2024-01-07 MED ORDER — MEDROXYPROGESTERONE ACETATE 150 MG/ML IM SUSY
150.0000 mg | PREFILLED_SYRINGE | Freq: Once | INTRAMUSCULAR | Status: AC
  Administered 2024-01-07: 150 mg via INTRAMUSCULAR

## 2024-01-07 NOTE — Progress Notes (Signed)
  Date of Visit: 01/07/2024   SUBJECTIVE:   HPI:  Discussed the use of AI scribe software for clinical note transcription with the patient, who gave verbal consent to proceed.  History of Present Illness [name] is a 48 year old female with diabetes who presents for a six-month follow-up and A1c check. She is accompanied by her caregiver.  Glycemic control - Diabetes managed with Jardiance  - Fasting blood glucose ranges from 80 to low 100s mg/dL - Occasional postprandial hyperglycemia after starch intake - Hemoglobin A1c is 5.7%  Lipid management - Takes Lipitor for hyperlipidemia - Cholesterol levels to be checked at next visit  Neuropsychiatric symptoms - On reduced dose of Depakote  at 500 mg - Takes 400 mg of Seroquel  at bedtime - Behavioral episodes decreased to approximately two per month - continues to see psych NP for management  Gastroesophageal reflux symptoms - Pepcid  is effective for acid reflux, caregiver has not heard any complaints    OBJECTIVE:   BP 115/79   Pulse 97   Ht 5' 4 (1.626 m)   Wt 136 lb 12.8 oz (62.1 kg)   SpO2 100%   BMI 23.48 kg/m  Gen: no acute distress, pleasant cooperative HEENT: normocephalic, atraumatic  Heart: regular rate and rhythm, no murmur Lungs: clear to auscultation bilaterally, normal work of breathing  Neuro: alert, speech normal, grossly nonfocal Ext: No appreciable lower extremity edema bilaterally   ASSESSMENT/PLAN:   Assessment & Plan Type 2 diabetes mellitus with stage 3a chronic kidney disease, without long-term current use of insulin  (HCC) Type 2 diabetes mellitus is well-controlled with fasting blood glucose levels in the 80 to low 100s and an A1c of 5.7. - Continue Jardiance . - Schedule follow-up in six months to recheck A1c. - Request records from her eye exam. Encounter for contraceptive management, unspecified type Depo shot today Routine adult health maintenance Request eye records Unable to  perform pap (see prior notes) Plan Hep B surface antibody next time labs are drawn Schizophrenia, unspecified type (HCC) Improving symptoms, continue follow up with psych NP     FOLLOW UP: Follow up in 3 mos for depo   Jacqueline Friedt J. Donah, MD [facility]

## 2024-01-07 NOTE — Patient Instructions (Signed)
 It was great to see you again today.  Stay on current medications Follow up in 6 months with me for diabetes 3 months for next dpo  Be well, Dr. Donah

## 2024-01-09 NOTE — Assessment & Plan Note (Signed)
 Request eye records Unable to perform pap (see prior notes) Plan Hep B surface antibody next time labs are drawn

## 2024-01-09 NOTE — Assessment & Plan Note (Signed)
 Type 2 diabetes mellitus is well-controlled with fasting blood glucose levels in the 80 to low 100s and an A1c of 5.7. - Continue Jardiance . - Schedule follow-up in six months to recheck A1c. - Request records from her eye exam.

## 2024-01-09 NOTE — Assessment & Plan Note (Signed)
Depo shot today.

## 2024-01-09 NOTE — Assessment & Plan Note (Signed)
 Improving symptoms, continue follow up with psych NP

## 2024-01-24 ENCOUNTER — Ambulatory Visit (HOSPITAL_COMMUNITY)
Admission: EM | Admit: 2024-01-24 | Discharge: 2024-01-24 | Disposition: A | Discharge status: Home / Self Care | Attending: Nurse Practitioner | Admitting: Nurse Practitioner

## 2024-01-24 DIAGNOSIS — F250 Schizoaffective disorder, bipolar type: Secondary | ICD-10-CM | POA: Diagnosis not present

## 2024-01-24 DIAGNOSIS — R454 Irritability and anger: Secondary | ICD-10-CM | POA: Insufficient documentation

## 2024-01-24 DIAGNOSIS — Z79899 Other long term (current) drug therapy: Secondary | ICD-10-CM | POA: Diagnosis not present

## 2024-01-24 DIAGNOSIS — F79 Unspecified intellectual disabilities: Secondary | ICD-10-CM | POA: Diagnosis not present
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
## 2024-01-24 NOTE — Discharge Instructions (Signed)
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
## 2024-02-12 ENCOUNTER — Other Ambulatory Visit: Payer: Self-pay | Admitting: Family Medicine

## 2024-02-12 DIAGNOSIS — Z1231 Encounter for screening mammogram for malignant neoplasm of breast: Secondary | ICD-10-CM

## 2024-03-24 ENCOUNTER — Ambulatory Visit (INDEPENDENT_AMBULATORY_CARE_PROVIDER_SITE_OTHER)

## 2024-03-24 DIAGNOSIS — Z3042 Encounter for surveillance of injectable contraceptive: Secondary | ICD-10-CM | POA: Diagnosis present

## 2024-03-24 MED ORDER — MEDROXYPROGESTERONE ACETATE 150 MG/ML IM SUSP
150.0000 mg | Freq: Once | INTRAMUSCULAR | Status: AC
  Administered 2024-03-24: 150 mg via INTRAMUSCULAR

## 2024-03-24 NOTE — Progress Notes (Signed)
 Patient here today for Depo Provera  injection and is within her dates.    Last contraceptive appt was 01/07/2024.  Depo given in [facility] today.  Site unremarkable & patient tolerated injection.    Next injection due 06/09/2024-06/23/2024.   Reminder card given.

## 2024-04-16 ENCOUNTER — Other Ambulatory Visit: Payer: Self-pay

## 2024-04-16 ENCOUNTER — Emergency Department (HOSPITAL_COMMUNITY)
Admission: EM | Admit: 2024-04-16 | Discharge: 2024-04-20 | Disposition: A | Attending: Emergency Medicine | Admitting: Emergency Medicine

## 2024-04-16 ENCOUNTER — Encounter (HOSPITAL_COMMUNITY): Payer: Self-pay

## 2024-04-16 DIAGNOSIS — F79 Unspecified intellectual disabilities: Secondary | ICD-10-CM

## 2024-04-16 DIAGNOSIS — R45851 Suicidal ideations: Secondary | ICD-10-CM | POA: Diagnosis not present

## 2024-04-16 DIAGNOSIS — T1491XA Suicide attempt, initial encounter: Secondary | ICD-10-CM

## 2024-04-16 DIAGNOSIS — F250 Schizoaffective disorder, bipolar type: Secondary | ICD-10-CM

## 2024-04-16 LAB — URINALYSIS, ROUTINE W REFLEX MICROSCOPIC
Bacteria, UA: NONE SEEN
Bilirubin Urine: NEGATIVE
Glucose, UA: >=500 mg/dL — AB
Hgb urine dipstick: NEGATIVE
Ketones, ur: NEGATIVE mg/dL
Nitrite: NEGATIVE
Protein, ur: NEGATIVE mg/dL
Specific Gravity, Urine: 1.006 (ref 1.005–1.030)
pH: 6 (ref 5.0–8.0)

## 2024-04-16 LAB — CBC WITH DIFFERENTIAL/PLATELET
Abs Immature Granulocytes: 0.02 K/uL (ref 0.00–0.07)
Basophils Absolute: 0 K/uL (ref 0.0–0.1)
Basophils Relative: 1 %
Eosinophils Absolute: 0.1 K/uL (ref 0.0–0.5)
Eosinophils Relative: 2 %
HCT: 37.9 % (ref 36.0–46.0)
Hemoglobin: 11.8 g/dL — ABNORMAL LOW (ref 12.0–15.0)
Immature Granulocytes: 0 %
Lymphocytes Relative: 31 %
Lymphs Abs: 1.9 K/uL (ref 0.7–4.0)
MCH: 28.5 pg (ref 26.0–34.0)
MCHC: 31.1 g/dL (ref 30.0–36.0)
MCV: 91.5 fL (ref 80.0–100.0)
Monocytes Absolute: 0.8 K/uL (ref 0.1–1.0)
Monocytes Relative: 14 %
Neutro Abs: 3.1 K/uL (ref 1.7–7.7)
Neutrophils Relative %: 52 %
Platelets: 174 K/uL (ref 150–400)
RBC: 4.14 MIL/uL (ref 3.87–5.11)
RDW: 13.3 % (ref 11.5–15.5)
WBC: 6 K/uL (ref 4.0–10.5)
nRBC: 0 % (ref 0.0–0.2)

## 2024-04-16 LAB — RAPID URINE DRUG SCREEN, HOSP PERFORMED
Amphetamines: NOT DETECTED
Barbiturates: NOT DETECTED
Benzodiazepines: POSITIVE — AB
Cocaine: NOT DETECTED
Opiates: NOT DETECTED
Tetrahydrocannabinol: NOT DETECTED

## 2024-04-16 LAB — COMPREHENSIVE METABOLIC PANEL WITH GFR
ALT: 13 U/L (ref 0–44)
AST: 20 U/L (ref 15–41)
Albumin: 3.3 g/dL — ABNORMAL LOW (ref 3.5–5.0)
Alkaline Phosphatase: 47 U/L (ref 38–126)
Anion gap: 12 (ref 5–15)
BUN: 26 mg/dL — ABNORMAL HIGH (ref 6–20)
CO2: 22 mmol/L (ref 22–32)
Calcium: 9.2 mg/dL (ref 8.9–10.3)
Chloride: 111 mmol/L (ref 98–111)
Creatinine, Ser: 2.28 mg/dL — ABNORMAL HIGH (ref 0.44–1.00)
GFR, Estimated: 26 mL/min — ABNORMAL LOW (ref >60)
Glucose, Bld: 109 mg/dL — ABNORMAL HIGH (ref 70–99)
Potassium: 3.7 mmol/L (ref 3.5–5.1)
Sodium: 145 mmol/L (ref 135–145)
Total Bilirubin: 0.4 mg/dL (ref 0.0–1.2)
Total Protein: 6.5 g/dL (ref 6.5–8.1)

## 2024-04-16 LAB — ETHANOL: Alcohol, Ethyl (B): <15 mg/dL (ref <15)

## 2024-04-16 LAB — ACETAMINOPHEN LEVEL: Acetaminophen (Tylenol), Serum: <10 ug/mL — ABNORMAL LOW (ref 10–30)

## 2024-04-16 LAB — MAGNESIUM: Magnesium: 2.2 mg/dL (ref 1.7–2.4)

## 2024-04-16 LAB — POC URINE PREG, ED: Preg Test, Ur: NEGATIVE

## 2024-04-16 LAB — SALICYLATE LEVEL: Salicylate Lvl: <7 mg/dL — ABNORMAL LOW (ref 7.0–30.0)

## 2024-04-16 LAB — HCG, SERUM, QUALITATIVE: Preg, Serum: NEGATIVE

## 2024-04-16 NOTE — BH Assessment (Signed)
 Patient was deferred to IRIS for a telepsych assessment. The assigned care coordinator will provide updates regarding the scheduling of the assessment. IRIS coordinator can be reached at [phone] for further information on the timing of the telepsych evaluation.

## 2024-04-16 NOTE — ED Notes (Signed)
 IVC FINDING AND CUSTODY UPLOADED  TO THE CHART ONLY

## 2024-04-16 NOTE — ED Notes (Signed)
 1ST EXAM AND ONE COPY OF FINDING GIVEN TO  SKINNER TO COMPLETE
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
## 2024-04-16 NOTE — ED Notes (Signed)
 IVC'ED In the field gpd arrives with paper work  @22 :44 PAPERWORK ATTACHED TO THE CLIP BOARD IN [place]

## 2024-04-17 DIAGNOSIS — F250 Schizoaffective disorder, bipolar type: Secondary | ICD-10-CM

## 2024-04-17 DIAGNOSIS — T1491XA Suicide attempt, initial encounter: Secondary | ICD-10-CM

## 2024-04-17 DIAGNOSIS — F79 Unspecified intellectual disabilities: Secondary | ICD-10-CM | POA: Diagnosis not present

## 2024-04-17 MED ORDER — ZIPRASIDONE MESYLATE 20 MG IM SOLR: 10.0000 mg | Freq: Four times a day (QID) | INTRAMUSCULAR | Status: DC | PRN

## 2024-04-17 MED ORDER — BENZTROPINE MESYLATE 1 MG PO TABS
1.0000 mg | ORAL_TABLET | Freq: Every day | ORAL | Status: DC
  Administered 2024-04-17 – 2024-04-20 (×4): 1 mg via ORAL
  Filled 2024-04-17 (×4): qty 1

## 2024-04-17 MED ORDER — CITALOPRAM HYDROBROMIDE 10 MG PO TABS
40.0000 mg | ORAL_TABLET | Freq: Every day | ORAL | Status: DC
  Administered 2024-04-17 – 2024-04-20 (×4): 40 mg via ORAL
  Filled 2024-04-17 (×4): qty 4

## 2024-04-17 MED ORDER — QUETIAPINE FUMARATE 200 MG PO TABS
400.0000 mg | ORAL_TABLET | Freq: Every day | ORAL | Status: DC
  Administered 2024-04-17 – 2024-04-19 (×3): 400 mg via ORAL
  Filled 2024-04-17 (×3): qty 2

## 2024-04-17 MED ORDER — LORAZEPAM 1 MG PO TABS
1.0000 mg | ORAL_TABLET | Freq: Two times a day (BID) | ORAL | Status: DC
  Administered 2024-04-17 – 2024-04-20 (×5): 1 mg via ORAL
  Filled 2024-04-17 (×6): qty 1

## 2024-04-17 MED ORDER — HYDROXYZINE HCL 50 MG PO TABS: 50.0000 mg | ORAL_TABLET | Freq: Four times a day (QID) | ORAL | Status: DC | PRN

## 2024-04-17 MED ORDER — DIAZEPAM 5 MG/ML IJ SOLN: 10.0000 mg | Freq: Four times a day (QID) | INTRAMUSCULAR | Status: DC | PRN

## 2024-04-17 MED ORDER — LORAZEPAM 1 MG PO TABS
2.0000 mg | ORAL_TABLET | Freq: Every day | ORAL | Status: DC
  Administered 2024-04-17 – 2024-04-19 (×3): 2 mg via ORAL
  Filled 2024-04-17 (×3): qty 2

## 2024-04-17 NOTE — ED Provider Notes (Signed)
 Emergency Medicine Observation Re-evaluation Note  [name] is a 48 y.o. female, seen on rounds today.  Pt initially presented to the ED for complaints of Suicide Attempt Currently, the patient is awake.  The pt came in yesterday with SI.  She was also psychotic and told the ED doc that she had a demon in her belly that was telling her to hurt herself and other people.  She is IVC'd.  Physical Exam  BP 109/60 (BP Location: Right Arm)   Pulse 87   Temp 98.6 F (37 C) (Oral)   Resp 18   SpO2 100%  Physical Exam General: awake and alert Cardiac: rr Lungs: clear Psych: calm  ED Course / MDM  EKG:EKG Interpretation Date/Time:  Saturday April 16 2024 21:37:35 EST Ventricular Rate:  97 PR Interval:  130 QRS Duration:  94 QT Interval:  369 QTC Calculation: 469 R Axis:   28  Text Interpretation: Sinus rhythm Low voltage, precordial leads Borderline repolarization abnormality when compared to prior,  more artifact and wandering baseline No STEMI Confirmed by Ginger Barefoot ([id number]) on 04/16/2024 9:51:19 PM  I have reviewed the labs performed to date as well as medications administered while in observation.  Recent changes in the last 24 hours include medically clear.  Plan  Current plan is for inpatient psych.    Dean Clarity, MD 04/17/24 [phone]

## 2024-04-17 NOTE — Progress Notes (Signed)
 CSW sent additional [facility] referral documentation to multiple BH facilities for continued review.    Bunnie Gallop, MSW, LCSW-A  11:26 AM 04/17/2024

## 2024-04-17 NOTE — ED Notes (Signed)
 Pt ambulated to bathroom

## 2024-04-17 NOTE — ED Notes (Signed)
 Belongings placed in locker 5 by NT

## 2024-04-17 NOTE — ED Notes (Signed)
 Pt transferred to hospital bed, sitter at bedside, pt alert and oriented. Pt states she is at the hospital because she wouldn't take her medications. When asked further questions, pt staring straight ahead and refusing to answer. Pt then ambulatory to bathroom with steady gait. Pt in NAD, remains 1:1 sitter, room checked for safety, pt in paper scrubs with no belongings at bedside.

## 2024-04-17 NOTE — ED Provider Notes (Signed)
 Patient was already IVC'd by group home. 1st exam completed.   Jacqueline Raynell Moder, MD 04/17/24 [phone]

## 2024-04-17 NOTE — ED Notes (Signed)
 TTS in process

## 2024-04-17 NOTE — ED Notes (Signed)
 Envelope Submitted Envelope [id number] has been submitted successfully. Case number [id number] Pt came in IVC, I uploaded all three documents, and received case number from magistrate. 3 copies made, and original in red folder. Copy of IVC is in pt chart under documents ,

## 2024-04-17 NOTE — ED Notes (Signed)
 Pt given snack and drink.
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
## 2024-04-17 NOTE — ED Notes (Signed)
Report received, assumed care of patient at this time.  
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
## 2024-04-17 NOTE — Progress Notes (Addendum)
 Patient has been denied by [facility] due to no appropriate beds available. Patient meets [facility] inpatient criteria per Dr. Adriana Miu. Patient has been faxed out to the following facilities:   [facility]  [address] [place] [place] [address] [phone]  [facility]  [address] [place] [address] [phone]  [facility]  [address], [place] [address] [phone]  [facility]  [address] [place], [place] [address] [phone]  [facility] Patient Placement  [facility] [place], [place] [phone]  [facility]  [address] [place] [address] [phone]  [facility]  [address] [place] [address] [phone]  [facility]  [address] [place] [address] [phone]  [facility] EFAX  [address] [place], [place] [phone]  [facility]  [address] [place] [address] [phone]  [facility] [address]  [place] [address] [phone]  [facility]  [facility] [place] [address] [phone]  [facility]  [address] [place] [address] [phone]  [facility]  [address] [place], [place] [address] [phone]  [facility]  [address] [place] [address] [phone]  [facility]  [address] [place] [address] [phone]  [facility]  [address] [place] [address] [phone]  [facility]   [address] [place] [address] [phone]  [facility]  [address] [place] [address] [phone]   Bunnie Gallop, MSW, LCSW-A  11:03 AM 04/17/2024

## 2024-04-18 DIAGNOSIS — F250 Schizoaffective disorder, bipolar type: Secondary | ICD-10-CM | POA: Diagnosis not present

## 2024-04-18 DIAGNOSIS — T1491XA Suicide attempt, initial encounter: Secondary | ICD-10-CM | POA: Diagnosis not present

## 2024-04-18 DIAGNOSIS — F79 Unspecified intellectual disabilities: Secondary | ICD-10-CM | POA: Diagnosis not present

## 2024-04-18 LAB — CBG MONITORING, ED: Glucose-Capillary: 128 mg/dL — ABNORMAL HIGH (ref 70–99)

## 2024-04-18 NOTE — ED Provider Notes (Signed)
 Emergency Medicine Observation Re-evaluation Note  [name] is a 48 y.o. female, seen on rounds today.  Pt initially presented to the ED for complaints of Suicide Attempt Currently, the patient is awaiting inpt placement for SI and psychosis.  Physical Exam  BP 110/74 (BP Location: Right Arm)   Pulse 98   Temp 98 F (36.7 C) (Oral)   Resp 19   SpO2 99%  Physical Exam General: NAD Cardiac: RR Lungs: even unlabored Psych: na  ED Course / MDM  EKG:EKG Interpretation Date/Time:  Saturday April 16 2024 21:37:35 EST Ventricular Rate:  97 PR Interval:  130 QRS Duration:  94 QT Interval:  369 QTC Calculation: 469 R Axis:   28  Text Interpretation: Sinus rhythm Low voltage, precordial leads Borderline repolarization abnormality when compared to prior,  more artifact and wandering baseline No STEMI Confirmed by Ginger Barefoot ([id number]) on 04/16/2024 9:51:19 PM  I have reviewed the labs performed to date as well as medications administered while in observation.  Recent changes in the last 24 hours include none.  Plan  Current plan is for patient is awaiting inpt placement for SI and psychosis.    Dreama Longs, MD 04/18/24 [phone]
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
## 2024-04-18 NOTE — ED Notes (Signed)
 Case Number: [id number]

## 2024-04-18 NOTE — Progress Notes (Signed)
 LCSW Progress Note  [id number]   [name]  04/18/2024  2:19 PM  Description:   Inpatient Psychiatric Referral  Patient was recommended inpatient per Adriana Pontes (NP). There are [facility] available beds at [facility], per [facility] Carlo RN). Patient was referred to the following out of network facilities:   Destination  Service Provider Address Phone Fax  [facility]  [address] [place] [address] [phone]  [facility]  [address] [place] [address] [phone]  [facility]  [address] [place] [address] [phone]  [facility]  [address] [place], [place] [address] [phone]  [facility] Patient Placement  [facility], [place] [phone]  [facility]  [address] [place] [address] [phone]  [facility]  [address], [place] [address] [phone]  [facility]  [address] [place] [address] [phone]  [facility]  [address] [place], [place] [phone]  [facility]  [address] [place] [address] [phone]  [facility] [address]  [place] [address] [phone]  [facility]  [facility] [address] [place] [address] [phone]  [facility]  [address] [place] [address] [phone]  [facility]  [address] [place], [place] [address] [phone]  [facility]  [address] [place] [address] [phone]  [facility]  [address] [place] [address] [phone]  [facility]  [address] [place] [address] [phone]  [facility]  [address] [place] [address] [phone]  [facility]  [address], [place] [address] [phone]      Situation ongoing, CSW to continue following and update chart as more information becomes available.     T[place]isia Victor Langenbach, MSW, LCSW  04/18/2024 2:19 PM

## 2024-04-18 NOTE — Progress Notes (Signed)
 Inpatient Psychiatric Referral  Patient was recommended inpatient per Elveria Batter, NP. There are [facility] available beds at [facility], per [facility] . Patient was referred to the following out of network facilities:  Destination  Service Provider Address Phone Fax  [facility]  [address] [place] [address] [phone]  [facility]  [address] [place] [address] [phone]  [facility]  [address] [place] [address] [phone]  [facility]  [address] [place], [place] [address] [phone]  [facility] Patient Placement  [facility], [place] [phone]  [facility]  [address] [place] [address] [phone]  [facility]  [address] [place] [address] [phone]  [facility]  [address] [place] [address] [phone]  [facility]FA[facility]  [address] [place], [place] [phone]  [facility]  [address] [place] [address] [phone]  [facility] [address]  [place] [address] [phone]  [facility]  [facility] [address] [place] [address] [phone]  [facility]  [address] [place] [address] [phone]  [facility]  [address] [place], [place] [address] [phone]  [facility]  [address] [place] [address] [phone]  [facility]  [address] [place] [address] [phone]  [facility]  [address]  [place] [address] [phone]  [facility]  [address], [place] [address] [phone]  [facility]  [address] [place] [address] [phone]    Situation ongoing, CSW to continue following and update chart as more information becomes available.   Harrie Sofia MSW, [place] 04/18/2024

## 2024-04-18 NOTE — ED Notes (Signed)
 Faxed requested info to [facility].awaiting fax confirmation

## 2024-04-18 NOTE — ED Notes (Signed)
 [facility] needs current crisis synopsis, DMS diagnosis, treatment hx, IVC paperwork fax [phone], current H&P with physician note and med clearance, any nursing notes, cbc results

## 2024-04-19 DIAGNOSIS — T1491XA Suicide attempt, initial encounter: Secondary | ICD-10-CM | POA: Diagnosis not present

## 2024-04-19 DIAGNOSIS — F79 Unspecified intellectual disabilities: Secondary | ICD-10-CM | POA: Diagnosis not present

## 2024-04-19 DIAGNOSIS — F250 Schizoaffective disorder, bipolar type: Secondary | ICD-10-CM

## 2024-04-19 MED ORDER — ONDANSETRON 4 MG PO TBDP
4.0000 mg | ORAL_TABLET | Freq: Once | ORAL | Status: AC
  Administered 2024-04-19: 4 mg via ORAL
  Filled 2024-04-19: qty 1

## 2024-04-19 MED ORDER — ONDANSETRON HCL 4 MG/2ML IJ SOLN: 4.0000 mg | Freq: Once | INTRAMUSCULAR | Status: DC

## 2024-04-19 NOTE — Progress Notes (Signed)
 Pt requested a Bible. Chaplain provided a New Testament which seemed to be ok with pt.  Chaplain attempted some interaction with pt, Pt did not engage; instead cradled the NT in her arms and laid down holding it.  [place] Chaplain

## 2024-04-19 NOTE — ED Notes (Addendum)
 Pt crying and then asks when dinner is. Encouragement and support given.

## 2024-04-19 NOTE — Progress Notes (Signed)
 Inpatient Psychiatric Referral  Patient was recommended inpatient per Elveria Batter, NP. There are no available beds at [facility], per [facility] . Patient was referred to the following out of network facilities:  Destination  Service Provider Address Phone Fax  [facility]  [address] [place] [address] [phone]  [facility]  [address] [place] [address] [phone]  [facility]  [address] [place] [address] [phone]  [facility]  [address] [place], [place] [address] [phone]  [facility] Patient Placement  [facility], [place] [phone]  [facility]  [address] [place] [address] [phone]  [facility]  [address], [place] [address] [phone]  [facility]  [address] [place] [address] [phone]  [facility] E[facility]  [address] [place], [place] [phone]  [facility]  [address] [place] [address] [phone]  [facility] [address]  [place] [address] [phone]  [facility]  [facility] [place] [address] [phone]  [facility]  [address] [place] [address] [phone]  [facility]  [address] [place], [place] [address] [phone]  [facility]  [address] [place] [address] [phone]  [facility]  [address], [place] [address] [phone]  [facility]  [address]  [place] [address] [phone]  [facility]  [address], [place] [address] [phone]  [facility]  [address] [place] [address] [phone]    Situation ongoing, CSW to continue following and update chart as more information becomes available.   Harrie Sofia MSW, [place] 04/19/2024

## 2024-04-19 NOTE — ED Provider Notes (Signed)
 Emergency Medicine Observation Re-evaluation Note  [name] is a 49 y.o. female, seen on rounds today.  Pt initially presented to the ED for complaints of Suicide Attempt Currently, the patient is resting.  Physical Exam  BP 122/79 (BP Location: Left Arm)   Pulse 97   Temp 99 F (37.2 C) (Oral)   Resp 15   Ht 5' 4 (1.626 m)   Wt 63 kg   SpO2 100%   BMI 23.84 kg/m  Physical Exam General: nad  ED Course / MDM  EKG:EKG Interpretation Date/Time:  Saturday April 16 2024 21:37:35 EST Ventricular Rate:  97 PR Interval:  130 QRS Duration:  94 QT Interval:  369 QTC Calculation: 469 R Axis:   28  Text Interpretation: Sinus rhythm Low voltage, precordial leads Borderline repolarization abnormality when compared to prior,  more artifact and wandering baseline No STEMI Confirmed by Ginger Barefoot ([id number]) on 04/16/2024 9:51:19 PM  I have reviewed the labs performed to date as well as medications administered while in observation.  Recent changes in the last 24 hours include no change.  Plan  Current plan is for placement.    Elnor Jayson LABOR, DO 04/19/24 [phone]

## 2024-04-19 NOTE — Progress Notes (Signed)
 LCSW Progress Note  [id number]   [name]  04/19/2024  3:40 PM  Description:   Inpatient Psychiatric Referral  Patient was recommended inpatient per Elveria Batter (NP). There [facility] available beds at [facility], per [facility] RN). Patient was referred to the following out of network facilities:   Destination  Service Provider Address Phone Fax  [facility]  [address] [place] [address] [phone]  [facility]  [address] [place] [address] [phone]  [facility]  [address], [place] [address] [phone]  [facility]  [address] [place], [place] [address] [phone]  [facility] Patient Placement  [facility], [place] [phone]  [facility]  [address] [place] [address] [phone]  [facility]  [address] [place] [address] [phone]  [facility]  [address] [place] [address] [phone]  [facility] EFAX  [address] [place], [place] [phone]  [facility]  [address] [place] [address] [phone]  [facility] [address]  [place] [address] [phone]  [facility]  [facility] [address] [place] [address] [phone]  [facility]  [address] [place] [address] [phone]  [facility]  [address] [place], [place] [address] [phone]  [facility]  [address] [place] [address] [phone]  [facility]  [address], [place] [address] [phone]  [facility]  [address] [place] [address] [phone]  [facility]  [address], [place] [address] [phone]  [facility]  [address] [place] [address] [phone]      Situation ongoing, CSW to continue following and update chart as more information becomes available.      Tunisia Eryk Beavers , MSW, LCSW  04/19/2024 3:40 PM

## 2024-04-19 NOTE — ED Notes (Signed)
 Case Number: [id number]  Patient IVC'd. 3 copies verified and IVC current.

## 2024-04-19 NOTE — ED Notes (Signed)
 Pt taking shower.

## 2024-04-19 NOTE — ED Notes (Signed)
Pt resting with eyes closed; respirations spontaneous, even, unlabored; sitter at bedside  
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
## 2024-04-20 ENCOUNTER — Encounter (HOSPITAL_COMMUNITY): Payer: Self-pay | Admitting: Psychiatry

## 2024-04-20 DIAGNOSIS — F250 Schizoaffective disorder, bipolar type: Secondary | ICD-10-CM | POA: Diagnosis not present

## 2024-04-20 DIAGNOSIS — F79 Unspecified intellectual disabilities: Secondary | ICD-10-CM | POA: Diagnosis not present

## 2024-04-20 DIAGNOSIS — R45851 Suicidal ideations: Secondary | ICD-10-CM

## 2024-04-20 NOTE — ED Provider Notes (Addendum)
 Emergency Medicine Observation Re-evaluation Note  [name] is a 48 y.o. female, seen on rounds today.  Pt initially presented to the ED for complaints of Suicide Attempt Currently, the patient is sleeping.  Physical Exam  BP 107/82 (BP Location: Right Arm)   Pulse (!) 106   Temp 97.9 F (36.6 C) (Oral)   Resp 17   Ht 5' 4 (1.626 m)   Wt 63 kg   SpO2 100%   BMI 23.84 kg/m  Physical Exam General: No acute distress Cardiac: Well-perfused Lungs: No respiratory distress Psych: Calm, sleeping  ED Course / MDM  EKG:EKG Interpretation Date/Time:  Saturday April 16 2024 21:37:35 EST Ventricular Rate:  97 PR Interval:  130 QRS Duration:  94 QT Interval:  369 QTC Calculation: 469 R Axis:   28  Text Interpretation: Sinus rhythm Low voltage, precordial leads Borderline repolarization abnormality when compared to prior,  more artifact and wandering baseline No STEMI Confirmed by Ginger Barefoot ([id number]) on 04/16/2024 9:51:19 PM  I have reviewed the labs performed to date as well as medications administered while in observation.  Recent changes in the last 24 hours include none.  Plan  Current plan is for disposition for psych recommendations.    Ula Prentice SAUNDERS, MD 04/20/24 [phone]    Ula Prentice SAUNDERS, MD 04/20/24 [phone]

## 2024-04-20 NOTE — ED Provider Notes (Signed)
 The patient was evaluated by our psychiatry team and they did recommend discharge.  The patient's IVC is rescinded and she is discharged back to her group home.  Please see below psychiatric recommendations at the time of discharge.  ## Psychiatric Recommendations:    - Recommend strict adherence to safety plan created today listed below - Recommend continue current psychiatric medications - Recommend close outpatient follow-up with the patient's outpatient psychiatry medication management and therapy services   Safety Plan 1. [name] will reach out to [name], call 911 or call mobile crisis, or go to nearest emergency room if condition worsens or if suicidal thoughts become active 2. Patients' will follow up with patient outpatient teams for outpatient psychiatric services (therapy/medication management).  3. The suicide prevention education provided includes the following:  Suicide risk factors  Suicide prevention and interventions  National Suicide Hotline telephone number  [facility] assessment telephone number  [facility] Emergency Assistance 911  [facility] and/or Residential Mobile Crisis Unit telephone number 4. Request made of family/significant other to: [name]  Remove weapons (e.g., guns, rifles, knives), all items previously/currently identified as safety concern.    Remove drugs/medications (over the counter, prescriptions, illicit drugs), all items previously/currently identified as a safety concern.    ## Medical Decision Making Capacity: Patient has a guardian and has thus been adjudicated incompetent; please involve patients guardian in medical decision making   ## Further Work-up:  -- None at this time   ## Disposition:--No psychiatric contraindication to discharge   ## Behavioral / Environmental: Recommend strict hearings to safety plan upon discharge   Ula Prentice SAUNDERS, MD 04/20/24 [phone]
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
## 2024-04-20 NOTE — Discharge Instructions (Addendum)
 Please continue your medications and follow-up with your outpatient psychiatric team.  Return to the ER for worsening symptoms.  Please strictly here to safety plan created today listed below.  Safety Plan [name] will reach out to [name], call 911 or call mobile crisis, or go to nearest emergency room if condition worsens or if suicidal thoughts become active Patients' will follow up with patient outpatient teams for outpatient psychiatric services (therapy/medication management).  The suicide prevention education provided includes the following: Suicide risk factors Suicide prevention and interventions National Suicide Hotline telephone number [facility] assessment telephone number [facility] Emergency Assistance 911 [facility] and/or Residential Mobile Crisis Unit telephone number Request made of family/significant other to: [name] weapons (e.g., guns, rifles, knives), all items previously/currently identified as safety concern.   Remove drugs/medications (over the counter, prescriptions, illicit drugs), all items previously/currently identified as a safety concern.

## 2024-04-20 NOTE — ED Notes (Signed)
 IVC IS CURRENT

## 2024-04-20 NOTE — ED Notes (Signed)
 AVS provided to and discussed with patient and group home staff member. Pt verbalizes understanding of discharge instructions and denies any questions or concerns at this time. Pt has ride home. Pt ambulated out of department independently with steady gait accompanied by staff member from group home. Legal guardian notified by phone of discharge back to group home.

## 2024-05-10 ENCOUNTER — Ambulatory Visit (INDEPENDENT_AMBULATORY_CARE_PROVIDER_SITE_OTHER): Admitting: Student

## 2024-05-10 VITALS — BP 104/80 | HR 122 | Wt 129.8 lb

## 2024-05-10 DIAGNOSIS — R051 Acute cough: Secondary | ICD-10-CM

## 2024-05-10 DIAGNOSIS — F209 Schizophrenia, unspecified: Secondary | ICD-10-CM | POA: Diagnosis not present

## 2024-05-10 NOTE — Progress Notes (Cosign Needed Addendum)
" ° ° °  SUBJECTIVE:   CHIEF COMPLAINT / HPI:   [name] is a 48 y.o. female  presenting for acute cough.   Symptoms started 2 days ago with non productive cough.  Medications tried at home honey.  Tmax: subjective fever  Sick contacts: drank after someone who was sick.  Signs of respiratory distress: no Appetite: normal  Hydration: normal   Glycemic control - Blood glucose fluctuating with illness - Blood glucose ranged from 202 mg/dL yesterday to 880 mg/dL today - Diabetes managed with sliding scale insulin   Behavioral disturbances - Ongoing severe behavioral problems including running away, stealing food, and physical aggression - Engages in dangerous behaviors toward self and others, such as running in front of cars and attempting to hang themself - Behavioral issues are longstanding and known to regular providers  PERTINENT  PMH / PSH: Reviewed and updated   OBJECTIVE:   BP 104/80   Pulse (!) 122   Wt 129 lb 12.8 oz (58.9 kg)   SpO2 98%   BMI 22.28 kg/m   Well-appearing, no acute distress HEENT: erythematous nasal turbinates, clear TMs bilaterally, mild cervical lymphadenopathy bilaterally. Mild maxillary sinus tenderness Cardio: Regular rate, regular rhythm, no murmurs on exam. <2 sec capillary refill  Pulm: Clear, no wheezing, no crackles. No increased work of breathing Abdominal: bowel sounds present, soft, non-tender, non-distended  ASSESSMENT/PLAN:   Assessment & Plan Acute cough No signs of respiratory distress on exam. Patient appears well hydrated.  Most likely viral mediated, supportive therapy indicated with return precautions for trouble breathing or persistent fever.  HR elevated at this visit most likely secondary to acute viral illness/agitation.  Recheck HR at later visit  Schizophrenia, unspecified type [facility]) Established with physician managing behavioral symptoms  Does not have active SI today in the clinic  Caregiver has already reached out  to managing MD to help with symptoms.     Damien Pinal, DO [facility]  "

## 2024-05-10 NOTE — Assessment & Plan Note (Addendum)
 Established with physician managing behavioral symptoms  Does not have active SI today in the clinic  Caregiver has already reached out to managing MD to help with symptoms.

## 2024-05-10 NOTE — Patient Instructions (Signed)
 Continue current supportive care at home.   Return in 10-14 days if no improvement or starting to have new fevers.

## 2024-05-11 ENCOUNTER — Telehealth: Payer: Self-pay

## 2024-05-11 NOTE — Telephone Encounter (Signed)
-----   Message from Otto Fairly, MD sent at 05/11/2024  7:51 AM EST ----- Randie Blackbird,  I believe you worked with Dr. Cleotilde yesterday to prep this patient. Upon chart review, I noticed that her HR was elevated. Please contact patient to schedule follow up today or earliest available appointment for tachycardia assessment. Also provide ED precautions. Thanks.  Dr. Fairly

## 2024-05-11 NOTE — Telephone Encounter (Signed)
 Called caregiver to schedule patient an office visit per Dr. Gerrit request for a recheck on elevated HR.   Scheduled for 05/17/2024 at 9:50am  Harlene Reiter, CMA

## 2024-05-13 ENCOUNTER — Inpatient Hospital Stay (HOSPITAL_BASED_OUTPATIENT_CLINIC_OR_DEPARTMENT_OTHER)
Admission: EM | Admit: 2024-05-13 | Discharge: 2024-05-18 | DRG: 865 | Disposition: A | Discharge status: Home Health | Attending: Family Medicine | Admitting: Family Medicine

## 2024-05-13 ENCOUNTER — Encounter (HOSPITAL_BASED_OUTPATIENT_CLINIC_OR_DEPARTMENT_OTHER): Payer: Self-pay

## 2024-05-13 ENCOUNTER — Other Ambulatory Visit: Payer: Self-pay

## 2024-05-13 ENCOUNTER — Emergency Department (HOSPITAL_BASED_OUTPATIENT_CLINIC_OR_DEPARTMENT_OTHER)

## 2024-05-13 DIAGNOSIS — E870 Hyperosmolality and hypernatremia: Secondary | ICD-10-CM | POA: Diagnosis not present

## 2024-05-13 DIAGNOSIS — N179 Acute kidney failure, unspecified: Secondary | ICD-10-CM | POA: Diagnosis present

## 2024-05-13 DIAGNOSIS — K219 Gastro-esophageal reflux disease without esophagitis: Secondary | ICD-10-CM | POA: Diagnosis present

## 2024-05-13 DIAGNOSIS — N184 Chronic kidney disease, stage 4 (severe): Secondary | ICD-10-CM | POA: Diagnosis present

## 2024-05-13 DIAGNOSIS — E1129 Type 2 diabetes mellitus with other diabetic kidney complication: Secondary | ICD-10-CM

## 2024-05-13 DIAGNOSIS — J1089 Influenza due to other identified influenza virus with other manifestations: Principal | ICD-10-CM | POA: Diagnosis present

## 2024-05-13 DIAGNOSIS — Z7984 Long term (current) use of oral hypoglycemic drugs: Secondary | ICD-10-CM

## 2024-05-13 DIAGNOSIS — G319 Degenerative disease of nervous system, unspecified: Secondary | ICD-10-CM | POA: Diagnosis present

## 2024-05-13 DIAGNOSIS — R4182 Altered mental status, unspecified: Principal | ICD-10-CM | POA: Diagnosis present

## 2024-05-13 DIAGNOSIS — S62102A Fracture of unspecified carpal bone, left wrist, initial encounter for closed fracture: Secondary | ICD-10-CM | POA: Insufficient documentation

## 2024-05-13 DIAGNOSIS — Z789 Other specified health status: Secondary | ICD-10-CM

## 2024-05-13 DIAGNOSIS — K5900 Constipation, unspecified: Secondary | ICD-10-CM | POA: Diagnosis not present

## 2024-05-13 DIAGNOSIS — E232 Diabetes insipidus: Secondary | ICD-10-CM | POA: Diagnosis present

## 2024-05-13 DIAGNOSIS — E1169 Type 2 diabetes mellitus with other specified complication: Secondary | ICD-10-CM | POA: Diagnosis not present

## 2024-05-13 DIAGNOSIS — Z7982 Long term (current) use of aspirin: Secondary | ICD-10-CM

## 2024-05-13 DIAGNOSIS — F250 Schizoaffective disorder, bipolar type: Secondary | ICD-10-CM | POA: Diagnosis present

## 2024-05-13 DIAGNOSIS — E1141 Type 2 diabetes mellitus with diabetic mononeuropathy: Secondary | ICD-10-CM | POA: Diagnosis present

## 2024-05-13 DIAGNOSIS — R400 Somnolence: Principal | ICD-10-CM

## 2024-05-13 DIAGNOSIS — F79 Unspecified intellectual disabilities: Secondary | ICD-10-CM | POA: Diagnosis present

## 2024-05-13 DIAGNOSIS — E1122 Type 2 diabetes mellitus with diabetic chronic kidney disease: Secondary | ICD-10-CM | POA: Diagnosis present

## 2024-05-13 DIAGNOSIS — E1140 Type 2 diabetes mellitus with diabetic neuropathy, unspecified: Secondary | ICD-10-CM | POA: Diagnosis not present

## 2024-05-13 DIAGNOSIS — E860 Dehydration: Secondary | ICD-10-CM | POA: Diagnosis present

## 2024-05-13 DIAGNOSIS — E785 Hyperlipidemia, unspecified: Secondary | ICD-10-CM | POA: Diagnosis not present

## 2024-05-13 DIAGNOSIS — M25432 Effusion, left wrist: Secondary | ICD-10-CM | POA: Diagnosis present

## 2024-05-13 DIAGNOSIS — G9341 Metabolic encephalopathy: Secondary | ICD-10-CM | POA: Diagnosis present

## 2024-05-13 DIAGNOSIS — Z881 Allergy status to other antibiotic agents status: Secondary | ICD-10-CM

## 2024-05-13 DIAGNOSIS — W19XXXA Unspecified fall, initial encounter: Secondary | ICD-10-CM | POA: Diagnosis present

## 2024-05-13 DIAGNOSIS — R7989 Other specified abnormal findings of blood chemistry: Secondary | ICD-10-CM | POA: Diagnosis present

## 2024-05-13 DIAGNOSIS — Z818 Family history of other mental and behavioral disorders: Secondary | ICD-10-CM

## 2024-05-13 DIAGNOSIS — N251 Nephrogenic diabetes insipidus: Secondary | ICD-10-CM | POA: Diagnosis present

## 2024-05-13 DIAGNOSIS — Z79899 Other long term (current) drug therapy: Secondary | ICD-10-CM

## 2024-05-13 DIAGNOSIS — M25532 Pain in left wrist: Secondary | ICD-10-CM | POA: Diagnosis present

## 2024-05-13 DIAGNOSIS — J101 Influenza due to other identified influenza virus with other respiratory manifestations: Secondary | ICD-10-CM | POA: Diagnosis present

## 2024-05-13 DIAGNOSIS — F209 Schizophrenia, unspecified: Secondary | ICD-10-CM | POA: Diagnosis present

## 2024-05-13 DIAGNOSIS — E7849 Other hyperlipidemia: Secondary | ICD-10-CM | POA: Diagnosis present

## 2024-05-13 LAB — COMPREHENSIVE METABOLIC PANEL WITH GFR
ALT: 62 U/L — ABNORMAL HIGH (ref 0–44)
AST: 155 U/L — ABNORMAL HIGH (ref 15–41)
Albumin: 3.8 g/dL (ref 3.5–5.0)
Alkaline Phosphatase: 66 U/L (ref 38–126)
Anion gap: 14 (ref 5–15)
BUN: 47 mg/dL — ABNORMAL HIGH (ref 6–20)
CO2: 25 mmol/L (ref 22–32)
Calcium: 10.4 mg/dL — ABNORMAL HIGH (ref 8.9–10.3)
Chloride: 119 mmol/L — ABNORMAL HIGH (ref 98–111)
Creatinine, Ser: 2.94 mg/dL — ABNORMAL HIGH (ref 0.44–1.00)
GFR, Estimated: 19 mL/min — ABNORMAL LOW
Glucose, Bld: 105 mg/dL — ABNORMAL HIGH (ref 70–99)
Potassium: 4.2 mmol/L (ref 3.5–5.1)
Sodium: 157 mmol/L — ABNORMAL HIGH (ref 135–145)
Total Bilirubin: 0.4 mg/dL (ref 0.0–1.2)
Total Protein: 7.9 g/dL (ref 6.5–8.1)

## 2024-05-13 LAB — CBC WITH DIFFERENTIAL/PLATELET
Abs Immature Granulocytes: 0.15 K/uL — ABNORMAL HIGH (ref 0.00–0.07)
Basophils Absolute: 0 K/uL (ref 0.0–0.1)
Basophils Relative: 1 %
Eosinophils Absolute: 0.1 K/uL (ref 0.0–0.5)
Eosinophils Relative: 1 %
HCT: 39.7 % (ref 36.0–46.0)
Hemoglobin: 12.6 g/dL (ref 12.0–15.0)
Immature Granulocytes: 2 %
Lymphocytes Relative: 24 %
Lymphs Abs: 1.6 K/uL (ref 0.7–4.0)
MCH: 29.2 pg (ref 26.0–34.0)
MCHC: 31.7 g/dL (ref 30.0–36.0)
MCV: 92.1 fL (ref 80.0–100.0)
Monocytes Absolute: 0.8 K/uL (ref 0.1–1.0)
Monocytes Relative: 13 %
Neutro Abs: 4 K/uL (ref 1.7–7.7)
Neutrophils Relative %: 59 %
Platelets: 135 K/uL — ABNORMAL LOW (ref 150–400)
RBC: 4.31 MIL/uL (ref 3.87–5.11)
RDW: 14.6 % (ref 11.5–15.5)
WBC: 6.5 K/uL (ref 4.0–10.5)
nRBC: 0 % (ref 0.0–0.2)

## 2024-05-13 LAB — OSMOLALITY: Osmolality: 341 mosm/kg (ref 275–295)

## 2024-05-13 LAB — RESP PANEL BY RT-PCR (RSV, FLU A&B, COVID)  RVPGX2
Influenza A by PCR: POSITIVE — AB
Influenza B by PCR: NEGATIVE
Resp Syncytial Virus by PCR: NEGATIVE
SARS Coronavirus 2 by RT PCR: NEGATIVE

## 2024-05-13 LAB — SODIUM
Sodium: 155 mmol/L — ABNORMAL HIGH (ref 135–145)
Sodium: 152 mmol/L — ABNORMAL HIGH (ref 135–145)

## 2024-05-13 LAB — MAGNESIUM: Magnesium: 3.3 mg/dL — ABNORMAL HIGH (ref 1.7–2.4)

## 2024-05-13 LAB — GLUCOSE, CAPILLARY: Glucose-Capillary: 129 mg/dL — ABNORMAL HIGH (ref 70–99)

## 2024-05-13 MED ORDER — ONDANSETRON HCL 4 MG PO TABS: 4.0000 mg | ORAL_TABLET | Freq: Four times a day (QID) | ORAL | Status: AC | PRN

## 2024-05-13 MED ORDER — OLANZAPINE 5 MG PO TABS
15.0000 mg | ORAL_TABLET | Freq: Every day | ORAL | Status: DC
  Administered 2024-05-14 – 2024-05-17 (×4): 15 mg via ORAL
  Filled 2024-05-13: qty 3
  Filled 2024-05-13: qty 6
  Filled 2024-05-13 (×4): qty 3

## 2024-05-13 MED ORDER — OLANZAPINE 10 MG PO TABS: 10.0000 mg | ORAL_TABLET | Freq: Every day | ORAL | Status: DC

## 2024-05-13 MED ORDER — ACETAMINOPHEN 650 MG RE SUPP: 650.0000 mg | Freq: Four times a day (QID) | RECTAL | Status: AC | PRN

## 2024-05-13 MED ORDER — DULOXETINE HCL 30 MG PO CPEP
30.0000 mg | ORAL_CAPSULE | Freq: Every day | ORAL | Status: DC
  Administered 2024-05-14 – 2024-05-17 (×4): 30 mg via ORAL
  Filled 2024-05-13 (×4): qty 1

## 2024-05-13 MED ORDER — ACETAMINOPHEN 325 MG PO TABS: 650.0000 mg | ORAL_TABLET | Freq: Four times a day (QID) | ORAL | Status: AC | PRN

## 2024-05-13 MED ORDER — INSULIN ASPART 100 UNIT/ML IJ SOLN
0.0000 [IU] | Freq: Three times a day (TID) | INTRAMUSCULAR | Status: DC
  Administered 2024-05-14: 2 [IU] via SUBCUTANEOUS
  Administered 2024-05-15 (×2): 1 [IU] via SUBCUTANEOUS
  Administered 2024-05-16: 2 [IU] via SUBCUTANEOUS
  Administered 2024-05-16 – 2024-05-17 (×2): 1 [IU] via SUBCUTANEOUS
  Administered 2024-05-17 – 2024-05-18 (×2): 2 [IU] via SUBCUTANEOUS
  Administered 2024-05-18: 1 [IU] via SUBCUTANEOUS
  Filled 2024-05-13: qty 2
  Filled 2024-05-13 (×2): qty 1
  Filled 2024-05-13: qty 2
  Filled 2024-05-13: qty 1
  Filled 2024-05-13 (×2): qty 2
  Filled 2024-05-13 (×2): qty 1

## 2024-05-13 MED ORDER — LORAZEPAM 1 MG PO TABS
1.0000 mg | ORAL_TABLET | Freq: Two times a day (BID) | ORAL | Status: DC
  Administered 2024-05-13 – 2024-05-18 (×9): 1 mg via ORAL
  Filled 2024-05-13 (×9): qty 1

## 2024-05-13 MED ORDER — FAMOTIDINE 20 MG PO TABS: 20.0000 mg | ORAL_TABLET | ORAL | Status: DC

## 2024-05-13 MED ORDER — BENZTROPINE MESYLATE 1 MG PO TABS
1.0000 mg | ORAL_TABLET | Freq: Every day | ORAL | Status: DC
  Administered 2024-05-14 – 2024-05-18 (×5): 1 mg via ORAL
  Filled 2024-05-13 (×5): qty 1

## 2024-05-13 MED ORDER — QUETIAPINE FUMARATE 100 MG PO TABS
400.0000 mg | ORAL_TABLET | Freq: Every day | ORAL | Status: DC
  Administered 2024-05-14 – 2024-05-17 (×4): 400 mg via ORAL
  Filled 2024-05-13 (×4): qty 4

## 2024-05-13 MED ORDER — INSULIN ASPART 100 UNIT/ML IJ SOLN: 0.0000 [IU] | Freq: Every day | INTRAMUSCULAR | Status: DC

## 2024-05-13 MED ORDER — ONDANSETRON HCL 4 MG/2ML IJ SOLN: 4.0000 mg | Freq: Four times a day (QID) | INTRAMUSCULAR | Status: AC | PRN

## 2024-05-13 MED ORDER — CITALOPRAM HYDROBROMIDE 20 MG PO TABS
40.0000 mg | ORAL_TABLET | Freq: Every day | ORAL | Status: DC
  Administered 2024-05-14 – 2024-05-18 (×5): 40 mg via ORAL
  Filled 2024-05-13 (×5): qty 2

## 2024-05-13 MED ORDER — FAMOTIDINE 20 MG PO TABS: 20.0000 mg | ORAL_TABLET | Freq: Two times a day (BID) | ORAL | Status: DC

## 2024-05-13 MED ORDER — HEPARIN SODIUM (PORCINE) 5000 UNIT/ML IJ SOLN
5000.0000 [IU] | Freq: Three times a day (TID) | INTRAMUSCULAR | Status: DC
  Administered 2024-05-13 – 2024-05-18 (×15): 5000 [IU] via SUBCUTANEOUS
  Filled 2024-05-13 (×15): qty 1

## 2024-05-13 MED ORDER — LORAZEPAM 1 MG PO TABS: 1.0000 mg | ORAL_TABLET | Freq: Every day | ORAL | Status: DC

## 2024-05-13 MED ORDER — DIVALPROEX SODIUM 250 MG PO DR TAB
250.0000 mg | DELAYED_RELEASE_TABLET | Freq: Two times a day (BID) | ORAL | Status: DC
  Administered 2024-05-13 – 2024-05-18 (×10): 250 mg via ORAL
  Filled 2024-05-13 (×11): qty 1

## 2024-05-13 MED ORDER — SODIUM CHLORIDE 0.9 % IV BOLUS
1000.0000 mL | Freq: Once | INTRAVENOUS | Status: AC
  Administered 2024-05-13: 1000 mL via INTRAVENOUS

## 2024-05-13 MED ORDER — DEXTROSE 5 % IV SOLN: INTRAVENOUS | Status: AC

## 2024-05-13 NOTE — Assessment & Plan Note (Signed)
 Holding home statin medication due to elevated liver enzymes A.m. team to resume when benefits outweigh the risk

## 2024-05-13 NOTE — Assessment & Plan Note (Signed)
 Home antiglycemic agent not resumed on admission Insulin  SSI with at bedtime coverage ordered

## 2024-05-13 NOTE — ED Triage Notes (Signed)
 Pt has not been eating well for the last 4 days, has not been taking medication, has not been following commands.

## 2024-05-13 NOTE — H&P (Signed)
 " History and Physical - Telemedicine  [name] FMW:[id number] DOB: 1975/10/18 DOA: 05/13/2024  PCP: Jacqueline Laymon PARAS, MD  Patient coming from: Home/group home  Referring provider: Susette Prince, EDP PA Telemedicine provider: Dr. Sherre Patient location: [name], ED at [facility] Referring diagnosis: Altered mental status, hyponatremia Patient name and DOB verified: Patient was able to verify her first and last name: [name], date of birth: [birth year]. Patient consented to Telemedicine Evaluation: yes via [employer] at bedside, Rosella Bunker RN virtual assistant: Dasie Bergeron, RN Video encounter time and date: 05/13/2024 and at approximately: 14:08  Chief Concern: Altered mental status  HPI: No notes on file --------------------------- At bedside, via telemedicine encounter, patient was able to whisper to me me her first last name, and her date of birth.  Full-time caregiver was at bedside.  Caregiver states that patient has not been eating very well for at least 2 days.  She has also been more somnolent.  Patient likes to eat other peoples food and take food in the trash can and there has been fluid in the home.  Caregiver denies vomiting, diarrhea, blood in her urine, blood in her stool, loss of consciousness, syncope.  Caregiver also denies swelling of the lower extremity.  Social history: Patient has a legal guardian.  Patient lives in a group home full-time.  Patient does not use tobacco, EtOH, recreational drug use.  ROS: Unable to complete due to limited speaking as patient has intellectual disability  ED Course: Discussed with EDP, patient requiring hospitalization for chief concerns of hyponatremia.  Assessment/Plan  Principal Problem:   Altered mental status Active Problems:   AKI (acute kidney injury)   Diabetes insipidus   Schizophrenia (HCC)   Schizoaffective disorder, bipolar type (HCC)   Type 2 diabetes mellitus with renal manifestations  (HCC)   Intellectual disability   GERD (gastroesophageal reflux disease)   Diabetic neuropathy, type II diabetes mellitus (HCC)   Hyperlipidemia associated with type 2 diabetes mellitus (HCC)   Hypernatremia   Assessment and Plan:  * Altered mental status Etiology workup in progress CT head without contrast ordered Suspect secondary to hyponatremia in setting of acute kidney injury due to poor p.o. intake in setting of recent influenza A infection Status post sodium chloride  1 L bolus per EDP D5 solution at 100 mL/h, 1 day ordered Check sodium every 4 hours, 3 checks ordered on admission Fall precautions  AKI (acute kidney injury) Secondary to poor p.o. intake D5W IVF Recheck BMP in a.m.  Schizoaffective disorder, bipolar type (HCC) Home quetiapine  400 mg nightly, Zyprexa 15 mg nightly, duloxetine 30 mg nightly, citalopram  40 mg daily were resumed on admission  Hyperlipidemia associated with type 2 diabetes mellitus (HCC) Holding home statin medication due to elevated liver enzymes A.m. team to resume when benefits outweigh the risk  Diabetic neuropathy, type II diabetes mellitus (HCC) Home antiglycemic agent not resumed on admission Insulin  SSI with at bedtime coverage ordered  Hypernatremia Secondary to poor p.o. intake in setting of recent influenza infection Check sodium every 4 hours, 3 checks ordered on admission D5W at 100 mL/h Recheck BMP in the a.m.  Chart reviewed.   DVT prophylaxis: Heparin  5000 units subcutaneous q. 8 hours Code Status: Full code, confirmed with caregiver at bedside Diet: Heart healthy/carb modified Family Communication: Caregiver at bedside updated, Rosella Bunker Disposition Plan: Pending clinical course Consults called: None at this time Admission status: Telemetry  Past Medical History:  Diagnosis Date   Acne    Anemia  Cognitive impairment    Diabetes mellitus without complication (HCC)    Internal hemorrhoid, bleeding  10/22/2010   Obesity    Schizoaffective disorder    SVT (supraventricular tachycardia)    with short PR interval - followed by Dr. Waddell   Past Surgical History:  Procedure Laterality Date   BREAST REDUCTION SURGERY  08/10/2005   HYSTEROSCOPY  09/09/2004   w/ removal of polyps   KIDNEY SURGERY Left 05/17/2020   Tumor removal by Dr. Ranell Shirk   REDUCTION MAMMAPLASTY Bilateral 2007   SPINE SURGERY N/A 04/18/2020   Dr. Mavis - pinched nerve of Lumbar Spine   Social History:  reports that she has never smoked. She has never been exposed to tobacco smoke. She has never used smokeless tobacco. She reports that she does not drink alcohol and does not use drugs.  Allergies[1] Family History  Problem Relation Age of Onset   Schizophrenia Sister    Breast cancer Neg Hx    BRCA 1/2 Neg Hx    Family history: Family history reviewed and not pertinent.  Prior to Admission medications  Medication Sig Start Date End Date Taking? Authorizing Provider  aspirin  (GNP ADULT ASPIRIN  LOW STRENGTH) 81 MG chewable tablet CHEW 1 TABLET DAILY 08/03/23  Yes Jacqueline Laymon PARAS, MD  atorvastatin  (LIPITOR) 20 MG tablet Take 1 tablet (20 mg total) by mouth at bedtime. 08/03/23  Yes Jacqueline Laymon PARAS, MD  benztropine  (COGENTIN ) 1 MG tablet Take 1 mg by mouth daily.   Yes [provider]  citalopram  (CELEXA ) 40 MG tablet Take 40 mg by mouth daily.   Yes [provider]  divalproex  (DEPAKOTE ) 250 MG DR tablet Take 250 mg by mouth 2 (two) times daily. \ 10/13/22  Yes [provider]  DULoxetine (CYMBALTA) 30 MG capsule Take 30 mg by mouth at bedtime. 04/12/24  Yes [provider]  empagliflozin  (JARDIANCE ) 10 MG TABS tablet Take 1 tablet (10 mg total) by mouth daily. 08/03/23  Yes Jacqueline Laymon PARAS, MD  famotidine  (PEPCID ) 20 MG tablet Take 1 tablet (20 mg total) by mouth 2 (two) times daily. 08/03/23  Yes Jacqueline Laymon PARAS, MD  INVEGA SUSTENNA 156 MG/ML SUSY injection  Inject 156 mg into the muscle once. 04/27/24  Yes [provider]  LORazepam  (ATIVAN ) 1 MG tablet Take 1 mg by mouth daily. 1 tab at 2 pm, and 1 tab at bedtime   Yes [provider]  losartan  (COZAAR ) 25 MG tablet Take 0.5 tablets (12.5 mg total) by mouth daily. 08/03/23  Yes Jacqueline Laymon PARAS, MD  Multiple Vitamins-Minerals (CENTRUM ADULTS) TABS Take 1 tablet by mouth daily. 08/03/23  Yes Jacqueline Laymon PARAS, MD  OLANZapine (ZYPREXA) 10 MG tablet Take 10 mg by mouth at bedtime. 03/21/24  Yes [provider]  OLANZapine (ZYPREXA) 15 MG tablet Take 15 mg by mouth at bedtime. 05/02/24  Yes [provider]  paliperidone (INVEGA SUSTENNA) 234 MG/1.5ML injection Inject 234 mg into the muscle every 21 ( twenty-one) days.   Yes [provider]  QUEtiapine  (SEROQUEL ) 400 MG tablet Take 400 mg by mouth at bedtime.   Yes [provider]  valbenazine  (INGREZZA ) 40 MG capsule Take 40 mg by mouth at bedtime. 09/25/21  Yes [provider]  glucose blood (ONETOUCH VERIO) test strip Use as instructed- patient to test her glucose once daily 08/03/23   Jacqueline Laymon PARAS, MD  Lancets [facility] DELICA PLUS [place]) MISC check blood sugar ONCE DAILY 08/03/23   Jacqueline,  Laymon PARAS, MD  medroxyPROGESTERone  (DEPO-PROVERA ) 150 MG/ML injection Inject 150 mg into the muscle every 3 (three) months.    [provider]   Physical Exam completed with assistance of: Dasie Bergeron, RN, who was at bedside during this portion of the virtual encounter:  Vitals:   05/13/24 1039 05/13/24 1044 05/13/24 1300 05/13/24 1730  BP:  111/77 119/81 (!) 131/95  Pulse:  (!) 118 98 92  Resp:  18 11 12   Temp:  98.2 F (36.8 C)    TempSrc:  Oral    SpO2: 99% 98% 95% 95%  Weight: 54.4 kg     Height: 5' 4 (1.626 m)      Constitutional: appears age-appropriate, NAD, calm Eyes: EOMI,  conjunctivae normal ENMT: Mucous membranes are dry. Hearing appropriate Neck:  normal, supple, no masses, no thyromegaly Respiratory: clear to auscultation bilaterally, no wheezing. Normal respiratory effort. No accessory muscle use.  Cardiovascular: Regular rate and rhythm, no murmurs. No extremity edema Abdomen: no tenderness. Bowel sounds positive.  Musculoskeletal: No joint deformity upper and lower extremities. Good ROM, no contractures, no atrophy. Skin: no rashes, ulcers on visible skin Neurologic: Strength is appropriate upper extremities.  Psychiatric: Normal judgment and insight. Alert and oriented x 3. Normal mood.   EKG: independently reviewed, showing sinus rhythm with rate of 96, QTc 443  Chest x-ray on Admission: I personally reviewed and I agree with radiologist reading as below.  CT HEAD WO CONTRAST ( ) Result Date: 05/13/2024 EXAM: CT HEAD WITHOUT CONTRAST 05/13/2024 05:29:55 PM TECHNIQUE: CT of the head was performed without the administration of intravenous contrast. Automated exposure control, iterative reconstruction, and/or weight based adjustment of the mA/kV was utilized to reduce the radiation dose to as low as reasonably achievable. COMPARISON: CT head without contrast 12/02/2021. CLINICAL HISTORY: Mental status change, unknown cause; Neuro deficit, acute, stroke suspected. FINDINGS: BRAIN AND VENTRICLES: No acute hemorrhage. No evidence of acute infarct. No hydrocephalus. No extra-axial collection. No mass effect or midline shift. Age advanced cerebral atrophy and white matter disease is stable. ORBITS: No acute abnormality. SINUSES: Large polyp or mucous retention cyst within right maxillary sinus. Mild mucosal thickening within left maxillary sinus at imaged levels. Mucosal thickening within ethmoid air cells. SOFT TISSUES AND SKULL: No acute soft tissue abnormality. No skull fracture. Hyperostosis frontalis. IMPRESSION: 1. No acute intracranial abnormality. 2. Stable age-advanced cerebral atrophy and white matter disease. Electronically signed by:  Lonni Necessary MD 05/13/2024 06:17 PM EST RP Workstation: [id number]   DG Chest Portable 1 View Result Date: 05/13/2024 CLINICAL DATA:  Altered. EXAM: PORTABLE CHEST 1 VIEW COMPARISON:  10/20/2021. FINDINGS: Low lung volumes. The heart size and mediastinal contours are within normal limits. No focal consolidation, pleural effusion, or pneumothorax. No acute osseous abnormality. IMPRESSION: No acute cardiopulmonary findings. Electronically Signed   By: Harrietta Sherry M.D.   On: 05/13/2024 13:21   Labs on Admission: I have personally reviewed following labs  CBC: Recent Labs  Lab 05/13/24 1216  WBC 6.5  NEUTROABS 4.0  HGB 12.6  HCT 39.7  MCV 92.1  PLT 135*   Basic Metabolic Panel: Recent Labs  Lab 05/13/24 1216  NA 157*  K 4.2  CL 119*  CO2 25  GLUCOSE 105*  BUN 47*  CREATININE 2.94*  CALCIUM  10.4*  MG 3.3*   GFR: Estimated Creatinine Clearance: 19.7 mL/min (A) (by C-G formula based on SCr of 2.94 mg/dL (H)).  Liver Function Tests: Recent Labs  Lab 05/13/24 1216  AST 155*  ALT  62*  ALKPHOS 66  BILITOT 0.4  PROT 7.9  ALBUMIN 3.8   Urine analysis:    Component Value Date/Time   COLORURINE STRAW (A) 04/16/2024 2130   APPEARANCEUR CLEAR 04/16/2024 2130   LABSPEC 1.006 04/16/2024 2130   PHURINE 6.0 04/16/2024 2130   GLUCOSEU >=500 (A) 04/16/2024 2130   HGBUR NEGATIVE 04/16/2024 2130   HGBUR negative 07/13/2008 1352   BILIRUBINUR NEGATIVE 04/16/2024 2130   BILIRUBINUR negative 03/06/2022 0842   BILIRUBINUR NEG 07/16/2015 1044   KETONESUR NEGATIVE 04/16/2024 2130   PROTEINUR NEGATIVE 04/16/2024 2130   UROBILINOGEN 0.2 03/06/2022 0842   UROBILINOGEN 0.2 06/26/2018 1053   NITRITE NEGATIVE 04/16/2024 2130   LEUKOCYTESUR TRACE (A) 04/16/2024 2130   This document was prepared using Dragon Voice Recognition software and may include unintentional dictation errors.  Dr. Sherre [facility] Location: [place]  If 7PM-7AM, please contact  overnight-coverage provider If 7AM-7PM, please contact day attending provider [email or web address]  05/13/2024, 6:36 PM      [1]  Allergies Allergen Reactions   Minocycline     Unknown    "

## 2024-05-13 NOTE — Assessment & Plan Note (Signed)
 Secondary to poor p.o. intake D5W IVF Recheck BMP in a.m.

## 2024-05-13 NOTE — ED Notes (Signed)
 Chart marked as ready green

## 2024-05-13 NOTE — ED Notes (Signed)
Pt assisted to bedside commode with 2 person assist

## 2024-05-13 NOTE — Assessment & Plan Note (Signed)
 Secondary to poor p.o. intake in setting of recent influenza infection Check sodium every 4 hours, 3 checks ordered on admission D5W at 100 mL/h Recheck BMP in the a.m.

## 2024-05-13 NOTE — Assessment & Plan Note (Addendum)
 Etiology workup in progress CT head without contrast ordered Suspect secondary to hyponatremia in setting of acute kidney injury due to poor p.o. intake in setting of recent influenza A infection Status post sodium chloride  1 L bolus per EDP D5 solution at 100 mL/h, 1 day ordered Check sodium every 4 hours, 3 checks ordered on admission Fall precautions

## 2024-05-13 NOTE — ED Provider Notes (Signed)
 " [place] [facility]GENCY DE[facility]MENT AT [facility] Provider Note   CSN: [id number] Arrival date & time: 05/13/24  1026     Patient presents with: Altered Mental Status   [name] is a 49 y.o. female.   Patient is a 49 year old female with a history of IDDM (under guardianship), SVT, DM2, CKD 3 s/p partial left nephrectomy, HLD, schizophrenia with history of tardive dyskinesia who presents today with her legal guardian for concern of tachycardia, weakness, somnolence.  Patient has been feeling like this now for the past several days.  She states that patient knows she is not allowed to have sugar or carbonated drinks.  She was recently admitted to psych for suicide attempt and her legal guardian says patient was given all of these drinks during that admission and she is facing the aftermath since patient was discharged.  She states that occasionally she will deal with this type of behavior occasionally.  She states this is not unusual for her but she wanted to ensure that it was not anything more serious.  She states that they were at the mall when she stole something from her fellow group home members and ran out.  She was found eating out of the garbage can.  This is not unusual for her.  Legal guardian states that she will do what ever it takes to get a hold of sugar somehow.  The morning after this happened she started coughing.  The history is provided by the patient. No language interpreter was used.       Prior to Admission medications  Medication Sig Start Date End Date Taking? Authorizing Provider  aspirin  (GNP ADULT ASPIRIN  LOW STRENGTH) 81 MG chewable tablet CHEW 1 TABLET DAILY 08/03/23  Yes Donah Laymon PARAS, MD  atorvastatin  (LIPITOR) 20 MG tablet Take 1 tablet (20 mg total) by mouth at bedtime. 08/03/23  Yes Donah Laymon PARAS, MD  benztropine  (COGENTIN ) 1 MG tablet Take 1 mg by mouth daily.   Yes [provider]  citalopram  (CELEXA ) 40 MG tablet Take 40  mg by mouth daily.   Yes [provider]  divalproex  (DEPAKOTE ) 250 MG DR tablet Take 250 mg by mouth 2 (two) times daily. \ 10/13/22  Yes [provider]  DULoxetine  (CYMBALTA ) 30 MG capsule Take 30 mg by mouth at bedtime. 04/12/24  Yes [provider]  empagliflozin  (JARDIANCE ) 10 MG TABS tablet Take 1 tablet (10 mg total) by mouth daily. 08/03/23  Yes Donah Laymon PARAS, MD  famotidine  (PEPCID ) 20 MG tablet Take 1 tablet (20 mg total) by mouth 2 (two) times daily. 08/03/23  Yes Donah Laymon PARAS, MD  LORazepam  (ATIVAN ) 1 MG tablet Take 1 mg by mouth daily. 1 tab at 2 pm, and 1 tab at bedtime   Yes [provider]  losartan  (COZAAR ) 25 MG tablet Take 0.5 tablets (12.5 mg total) by mouth daily. 08/03/23  Yes Donah Laymon PARAS, MD  Multiple Vitamins-Minerals (CENTRUM ADULTS) TABS Take 1 tablet by mouth daily. 08/03/23  Yes Donah Laymon PARAS, MD  OLANZapine  (ZYPREXA ) 10 MG tablet Take 10 mg by mouth at bedtime. 03/21/24  Yes [provider]  paliperidone (INVEGA SUSTENNA) 234 MG/1.5ML injection Inject 234 mg into the muscle every 21 ( twenty-one) days.   Yes [provider]  QUEtiapine  (SEROQUEL ) 400 MG tablet Take 400 mg by mouth at bedtime.   Yes [provider]  valbenazine  (INGREZZA ) 40 MG capsule Take 40 mg by mouth at bedtime. 09/25/21  Yes  [provider]  glucose blood (ONETOUCH VERIO) test strip Use as instructed- patient to test her glucose once daily 08/03/23   Donah Laymon PARAS, MD  Lancets [facility] DELICA PLUS [place]) MISC check blood sugar ONCE DAILY 08/03/23   Donah Laymon PARAS, MD  medroxyPROGESTERone  (DEPO-PROVERA ) 150 MG/ML injection Inject 150 mg into the muscle every 3 (three) months.    [provider]    Allergies: Minocycline    Review of Systems  Constitutional:  Negative for chills and fever.  Respiratory:  Negative for shortness of breath.   Gastrointestinal:  Negative for  abdominal pain and vomiting.  Genitourinary:  Negative for dysuria.  Neurological:  Positive for weakness. Negative for light-headedness.  All other systems reviewed and are negative.   Updated Vital Signs BP 111/77 (BP Location: Right Arm)   Pulse (!) 118   Temp 98.2 F (36.8 C) (Oral)   Resp 18   Ht 5' 4 (1.626 m)   Wt 54.4 kg   SpO2 98%   BMI 20.60 kg/m   Physical Exam Vitals and nursing note reviewed.  Constitutional:      General: She is not in acute distress.    Appearance: Normal appearance. She is not ill-appearing.  HENT:     Head: Normocephalic and atraumatic.     Nose: Nose normal.  Eyes:     Conjunctiva/sclera: Conjunctivae normal.  Cardiovascular:     Rate and Rhythm: Regular rhythm. Tachycardia present.  Pulmonary:     Effort: Pulmonary effort is normal. No respiratory distress.  Abdominal:     General: There is no distension.     Palpations: Abdomen is soft.     Tenderness: There is no abdominal tenderness. There is no guarding.  Musculoskeletal:        General: No deformity. Normal range of motion.     Cervical back: Normal range of motion.  Skin:    Findings: No rash.  Neurological:     Mental Status: She is alert.     (all labs ordered are listed, but only abnormal results are displayed) Labs Reviewed - No data to display  EKG: None  Radiology: No results found.   .Critical Care  Performed by: Hildegard Loge, PA-C Authorized by: Hildegard Loge, PA-C   Critical care provider statement:    Critical care time (minutes):  30   Critical care was necessary to treat or prevent imminent or life-threatening deterioration of the following conditions:  Metabolic crisis   Critical care was time spent personally by me on the following activities:  Development of treatment plan with patient or surrogate, discussions with consultants, evaluation of patient's response to treatment, examination of patient, ordering and review of laboratory studies, ordering  and review of radiographic studies, ordering and performing treatments and interventions, pulse oximetry, re-evaluation of patient's condition and review of old charts   Care discussed with: admitting provider      Medications Ordered in the ED  sodium chloride  0.9 % bolus 1,000 mL (has no administration in time range)    Clinical Course as of 05/13/24 1526  Fri May 13, 2024  1445 Hypernatremic at 157.  Creatinine at 2.94 which is slightly above her baseline.  CBC without acute concern.  Flu positive.  Chest x-ray without acute cardiopulmonary process. Passed swallow study. Discussed with hospitalist.  They will evaluate patient for admission for the hypernatremia. [AA]    Clinical Course User Index [AA] Hildegard Loge, PA-C  Medical Decision Making Amount and/or Complexity of Data Reviewed Labs: ordered. Radiology: ordered.  Risk Decision regarding hospitalization.   Medical Decision Making / ED Course   This patient presents to the ED for concern of fatigue, weakness, tachycardia, somnolence, this involves an extensive number of treatment options, and is a complaint that carries with it a high risk of complications and morbidity.  The differential diagnosis includes viral URI, bacteremia, pneumonia,  MDM: 49 year old female presents with above-mentioned complaints.  Legal guardian at bedside.  Appears alert but does not want to communicate.  According to legal guardian this is not unusual if patient does not get what she wants.  Throughout the morning she has been verbalizing everything she does not want to do.  Will provide fluids, obtain labs, obtain chest x-ray and respiratory panel.  Her hypernatremia is likely a result of dehydration.  Further reinforced by the renal insufficiency.  Discussed with hospitalist.  They will evaluate for admission.   Additional history obtained: -Additional history obtained from echocardiogram  bedside -External records from outside source obtained and reviewed including: Chart review including previous notes, labs, imaging, consultation notes   Lab Tests: -I ordered, reviewed, and interpreted labs.   The pertinent results include:   Labs Reviewed  RESP PANEL BY RT-PCR (RSV, FLU A&B, COVID)  [id number]  CBC WITH DIFFERENTIAL/PLATELET  MAGNESIUM  URINALYSIS, ROUTINE W REFLEX MICROSCOPIC  COMPREHENSIVE METABOLIC PANEL WITH GFR      EKG  EKG Interpretation Date/Time:    Ventricular Rate:    PR Interval:    QRS Duration:    QT Interval:    QTC Calculation:   R Axis:      Text Interpretation:           Imaging Studies ordered: I ordered imaging studies including chest x-ray I independently visualized and interpreted imaging. I agree with the radiologist interpretation   Medicines ordered and prescription drug management: Meds ordered this encounter  Medications   sodium chloride  0.9 % bolus 1,000 mL    -I have reviewed the patients home medicines and have made adjustments as needed  Critical interventions IV fluids, hypernatremia, admission  Cardiac Monitoring: The patient was maintained on a cardiac monitor.  I personally viewed and interpreted the cardiac monitored which showed an underlying rhythm of: Initially sinus tachycardic improved to normal sinus rhythm after the fluids  Social Determinants of Health:  Factors impacting patients care include: Lives in a group home, intellectual disability   Reevaluation: After the interventions noted above, I reevaluated the patient and found that they have :stayed the same  Co morbidities that complicate the patient evaluation  Past Medical History:  Diagnosis Date   Acne    Anemia    Cognitive impairment    Diabetes mellitus without complication (HCC)    Internal hemorrhoid, bleeding 10/22/2010   Obesity    Schizoaffective disorder    SVT (supraventricular tachycardia)    with short PR  interval - followed by Dr. Waddell      Dispostion: Discharged in stable condition.  Return precautions discussed.  Patient voices understanding and is in agreement with plan.  Final diagnoses:  None    ED Discharge Orders     None          Hildegard Loge, [place] 05/13/24 1527    Levander Houston, MD 05/26/24 1418  "

## 2024-05-13 NOTE — ED Notes (Signed)
 Deanne with [facility] called for transport

## 2024-05-13 NOTE — Assessment & Plan Note (Signed)
 Home quetiapine  400 mg nightly, Zyprexa 15 mg nightly, duloxetine 30 mg nightly, citalopram  40 mg daily were resumed on admission

## 2024-05-13 NOTE — ED Notes (Signed)
 Patient transported to CT

## 2024-05-13 NOTE — Progress Notes (Addendum)
 Hospitalist Progress Note:  Patient admitted by Hospitalists from [facility] to [facility] for altered mental status. Virtual admission with full admission orders and admission H&P have been completed today (05/13/24) by Dr. Greig Free.   As requested by PP RN, I eyeballed this patient, who does not appear to be in any obvious acute distress.     Eva Pore, DO Hospitalist

## 2024-05-14 DIAGNOSIS — Z881 Allergy status to other antibiotic agents status: Secondary | ICD-10-CM | POA: Diagnosis not present

## 2024-05-14 DIAGNOSIS — E7849 Other hyperlipidemia: Secondary | ICD-10-CM | POA: Diagnosis present

## 2024-05-14 DIAGNOSIS — R7989 Other specified abnormal findings of blood chemistry: Secondary | ICD-10-CM | POA: Diagnosis present

## 2024-05-14 DIAGNOSIS — Z79899 Other long term (current) drug therapy: Secondary | ICD-10-CM | POA: Diagnosis not present

## 2024-05-14 DIAGNOSIS — N179 Acute kidney failure, unspecified: Secondary | ICD-10-CM | POA: Diagnosis present

## 2024-05-14 DIAGNOSIS — E870 Hyperosmolality and hypernatremia: Secondary | ICD-10-CM

## 2024-05-14 DIAGNOSIS — K219 Gastro-esophageal reflux disease without esophagitis: Secondary | ICD-10-CM | POA: Diagnosis present

## 2024-05-14 DIAGNOSIS — N184 Chronic kidney disease, stage 4 (severe): Secondary | ICD-10-CM | POA: Diagnosis present

## 2024-05-14 DIAGNOSIS — F250 Schizoaffective disorder, bipolar type: Secondary | ICD-10-CM | POA: Diagnosis present

## 2024-05-14 DIAGNOSIS — R4182 Altered mental status, unspecified: Secondary | ICD-10-CM | POA: Diagnosis not present

## 2024-05-14 DIAGNOSIS — E1169 Type 2 diabetes mellitus with other specified complication: Secondary | ICD-10-CM | POA: Diagnosis present

## 2024-05-14 DIAGNOSIS — E232 Diabetes insipidus: Secondary | ICD-10-CM | POA: Diagnosis not present

## 2024-05-14 DIAGNOSIS — M25432 Effusion, left wrist: Secondary | ICD-10-CM | POA: Diagnosis present

## 2024-05-14 DIAGNOSIS — E1122 Type 2 diabetes mellitus with diabetic chronic kidney disease: Secondary | ICD-10-CM | POA: Diagnosis present

## 2024-05-14 DIAGNOSIS — J101 Influenza due to other identified influenza virus with other respiratory manifestations: Secondary | ICD-10-CM | POA: Diagnosis present

## 2024-05-14 DIAGNOSIS — N251 Nephrogenic diabetes insipidus: Secondary | ICD-10-CM | POA: Diagnosis present

## 2024-05-14 DIAGNOSIS — G319 Degenerative disease of nervous system, unspecified: Secondary | ICD-10-CM | POA: Diagnosis present

## 2024-05-14 DIAGNOSIS — W19XXXA Unspecified fall, initial encounter: Secondary | ICD-10-CM | POA: Diagnosis present

## 2024-05-14 DIAGNOSIS — F79 Unspecified intellectual disabilities: Secondary | ICD-10-CM | POA: Diagnosis present

## 2024-05-14 DIAGNOSIS — M25532 Pain in left wrist: Secondary | ICD-10-CM | POA: Diagnosis present

## 2024-05-14 DIAGNOSIS — Z7982 Long term (current) use of aspirin: Secondary | ICD-10-CM | POA: Diagnosis not present

## 2024-05-14 DIAGNOSIS — G9341 Metabolic encephalopathy: Secondary | ICD-10-CM | POA: Diagnosis present

## 2024-05-14 DIAGNOSIS — E1141 Type 2 diabetes mellitus with diabetic mononeuropathy: Secondary | ICD-10-CM | POA: Diagnosis present

## 2024-05-14 DIAGNOSIS — K5900 Constipation, unspecified: Secondary | ICD-10-CM | POA: Diagnosis not present

## 2024-05-14 DIAGNOSIS — Z7984 Long term (current) use of oral hypoglycemic drugs: Secondary | ICD-10-CM | POA: Diagnosis not present

## 2024-05-14 DIAGNOSIS — J1089 Influenza due to other identified influenza virus with other manifestations: Secondary | ICD-10-CM | POA: Diagnosis present

## 2024-05-14 DIAGNOSIS — E860 Dehydration: Secondary | ICD-10-CM | POA: Diagnosis present

## 2024-05-14 DIAGNOSIS — Z789 Other specified health status: Secondary | ICD-10-CM

## 2024-05-14 LAB — CBC
HCT: 40.6 % (ref 36.0–46.0)
Hemoglobin: 12.3 g/dL (ref 12.0–15.0)
MCH: 29.3 pg (ref 26.0–34.0)
MCHC: 30.3 g/dL (ref 30.0–36.0)
MCV: 96.7 fL (ref 80.0–100.0)
Platelets: 134 K/uL — ABNORMAL LOW (ref 150–400)
RBC: 4.2 MIL/uL (ref 3.87–5.11)
RDW: 14.5 % (ref 11.5–15.5)
WBC: 6.2 K/uL (ref 4.0–10.5)
nRBC: 0.3 % — ABNORMAL HIGH (ref 0.0–0.2)

## 2024-05-14 LAB — BASIC METABOLIC PANEL WITH GFR
Anion gap: 13 (ref 5–15)
Anion gap: 15 (ref 5–15)
Anion gap: 11 (ref 5–15)
BUN: 42 mg/dL — ABNORMAL HIGH (ref 6–20)
BUN: 40 mg/dL — ABNORMAL HIGH (ref 6–20)
BUN: 39 mg/dL — ABNORMAL HIGH (ref 6–20)
CO2: 17 mmol/L — ABNORMAL LOW (ref 22–32)
CO2: 17 mmol/L — ABNORMAL LOW (ref 22–32)
CO2: 22 mmol/L (ref 22–32)
Calcium: 9.1 mg/dL (ref 8.9–10.3)
Calcium: 9.3 mg/dL (ref 8.9–10.3)
Calcium: 8.8 mg/dL — ABNORMAL LOW (ref 8.9–10.3)
Chloride: 120 mmol/L — ABNORMAL HIGH (ref 98–111)
Chloride: 115 mmol/L — ABNORMAL HIGH (ref 98–111)
Chloride: 115 mmol/L — ABNORMAL HIGH (ref 98–111)
Creatinine, Ser: 2.49 mg/dL — ABNORMAL HIGH (ref 0.44–1.00)
Creatinine, Ser: 2.41 mg/dL — ABNORMAL HIGH (ref 0.44–1.00)
Creatinine, Ser: 2.24 mg/dL — ABNORMAL HIGH (ref 0.44–1.00)
GFR, Estimated: 23 mL/min — ABNORMAL LOW
GFR, Estimated: 24 mL/min — ABNORMAL LOW
GFR, Estimated: 26 mL/min — ABNORMAL LOW
Glucose, Bld: 167 mg/dL — ABNORMAL HIGH (ref 70–99)
Glucose, Bld: 129 mg/dL — ABNORMAL HIGH (ref 70–99)
Glucose, Bld: 145 mg/dL — ABNORMAL HIGH (ref 70–99)
Potassium: 4.4 mmol/L (ref 3.5–5.1)
Potassium: 4.8 mmol/L (ref 3.5–5.1)
Potassium: 3.9 mmol/L (ref 3.5–5.1)
Sodium: 150 mmol/L — ABNORMAL HIGH (ref 135–145)
Sodium: 148 mmol/L — ABNORMAL HIGH (ref 135–145)
Sodium: 148 mmol/L — ABNORMAL HIGH (ref 135–145)

## 2024-05-14 LAB — GLUCOSE, CAPILLARY
Glucose-Capillary: 159 mg/dL — ABNORMAL HIGH (ref 70–99)
Glucose-Capillary: 117 mg/dL — ABNORMAL HIGH (ref 70–99)
Glucose-Capillary: 91 mg/dL (ref 70–99)
Glucose-Capillary: 132 mg/dL — ABNORMAL HIGH (ref 70–99)

## 2024-05-14 LAB — SODIUM: Sodium: 150 mmol/L — ABNORMAL HIGH (ref 135–145)

## 2024-05-14 LAB — HEMOGLOBIN A1C
Hgb A1c MFr Bld: 6.3 % — ABNORMAL HIGH (ref 4.8–5.6)
Mean Plasma Glucose: 134.11 mg/dL

## 2024-05-14 MED ORDER — FAMOTIDINE 20 MG PO TABS
20.0000 mg | ORAL_TABLET | Freq: Every day | ORAL | Status: DC
  Administered 2024-05-15 – 2024-05-18 (×4): 20 mg via ORAL
  Filled 2024-05-14 (×4): qty 1

## 2024-05-14 MED ORDER — DEXTROSE 5 % IV SOLN: INTRAVENOUS | Status: AC

## 2024-05-14 MED ORDER — FAMOTIDINE 20 MG PO TABS: 40.0000 mg | ORAL_TABLET | Freq: Every day | ORAL | Status: DC

## 2024-05-14 MED ORDER — OSELTAMIVIR PHOSPHATE 30 MG PO CAPS
30.0000 mg | ORAL_CAPSULE | Freq: Every day | ORAL | Status: AC
  Administered 2024-05-14 – 2024-05-18 (×5): 30 mg via ORAL
  Filled 2024-05-14 (×6): qty 1

## 2024-05-14 NOTE — Assessment & Plan Note (Addendum)
 Gradually improving mental status, awake and alert, though still quite tired.  Spoke with caregiver and stated that if she is wanting to eat more food outside her diet that is her baseline. Initial workup CT head without contrast, initially suspected 2/2 hypernatremia AM BMP pending, p.m. BMP ordered Continue hypernatremia treatment as below

## 2024-05-14 NOTE — Assessment & Plan Note (Addendum)
 Initial sodium 157, gradually trending down to 152 yesterday evening, pending BMPs as mentioned above.  Serum osmolarity 341, no urine osmolality collected.  Primarily suspect due to poor p.o. intake.  Eating a little more, though still not her baseline. Continue D5W 100 mL/h until sodium WNL, at which point can be gradually tapered AM BMP pending, if sodium WNL can continue BMP twice daily.  If abnormal, continue every 6 hours serum sodium checks until normalized. Strict I's and O's, p.o. 450 overnight

## 2024-05-14 NOTE — Assessment & Plan Note (Signed)
 Positive on admission, stable on room air and afebrile viral persisting cough with altered mental status. Started Tamiflu  30 mg daily for 5 days, renally dosed

## 2024-05-14 NOTE — Evaluation (Signed)
 Physical Therapy Evaluation Patient Details Name: [name] MRN: [id number] DOB: [birth year] Today's Date: 05/14/2024  History of Present Illness  The pt is a 49 yo female presenting 1/2 with decreased po intake for 4 days, AMS. Found to have hypernatremia due to dehydration and influenza A. . PMH includes: intellectual disability, SVT, DM II, CKD III, HLD, and schizophrenia.  Clinical Impression  Pt in bed upon arrival of PT, agreeable to evaluation at this time. Prior to admission the pt was completely independent with mobility without need for DME, the pt's caregiver states she would even run at times when chasing something she wants at home. The pt is also typically able to complete all ADLs with supervision, and relies on caregiver for medication management. The pt presents with lethargy and intermittent command following in session, answering some simple questions but not consistently. The pt required modA to complete sit-stand without DME, and minA to complete transfer with RW. Pt demos poor initiation and sequencing, benefits from initiation and guidance from PT to complete movements. Pt needing up to modA to correct posterior LOB with backwards stepping, and was limited to within-room mobility at this time. Will continue to follow acutely to progress functional strength for transfers and endurance prior to anticipated return home with caregiver support.    If plan is discharge home, recommend the following: A lot of help with walking and/or transfers;A little help with bathing/dressing/bathroom;Assistance with cooking/housework;Direct supervision/assist for medications management;Direct supervision/assist for financial management;Assist for transportation;Supervision due to cognitive status   Can travel by private vehicle        Equipment Recommendations None recommended by PT  Recommendations for Other Services       Functional Status Assessment Patient has had a recent decline in  their functional status and demonstrates the ability to make significant improvements in function in a reasonable and predictable amount of time.     Precautions / Restrictions Precautions Precautions: Fall Recall of Precautions/Restrictions: Impaired Restrictions Weight Bearing Restrictions Per Provider Order: No      Mobility  Bed Mobility               General bed mobility comments: OOB in recliner at start and end of session    Transfers Overall transfer level: Needs assistance Equipment used: Rolling walker (2 wheels), 1 person hand held assist Transfers: Sit to/from Stand Sit to Stand: Min assist, Mod assist           General transfer comment: minA with RW, modA with HHA, pt reluctant to let go of armrests and with R lateral lean and posterior lean in standing    Ambulation/Gait Ambulation/Gait assistance: Min assist, Mod assist Gait Distance (Feet): 8 Feet Assistive device: Rolling walker (2 wheels) Gait Pattern/deviations: Step-to pattern, Decreased stride length, Shuffle, Narrow base of support, Trunk flexed Gait velocity: decreased Gait velocity interpretation: <1.31 ft/sec, indicative of household ambulator   General Gait Details: pt with trunk flexed and narrow BOS, small steps with minimal clearance and needing PT to initiate forwards movement withg RW. posterior lean needing up to modA to correct LOB, especialyl with backwards stepping     Balance Overall balance assessment: Needs assistance Sitting-balance support: No upper extremity supported, Feet supported Sitting balance-Leahy Scale: Poor Sitting balance - Comments: lateral lean to R, unable to correct with cues Postural control: Right lateral lean, Posterior lean Standing balance support: Bilateral upper extremity supported, During functional activity Standing balance-Leahy Scale: Poor Standing balance comment: posterior lean in stance, up to modA to  correct LOB                              Pertinent Vitals/Pain Pain Assessment Pain Assessment: No/denies pain    Home Living Family/patient expects to be discharged to:: Group home Living Arrangements: Other (Comment) (has a caregiver and roommates) Available Help at Discharge: Personal care attendant Type of Home: House Home Access: Stairs to enter Entrance Stairs-Rails: Right;Left;Can reach both Secretary/administrator of Steps: 3   Home Layout: One level Home Equipment: Shower seat;Grab bars - tub/shower;Hand held shower head      Prior Function Prior Level of Function : Needs assist             Mobility Comments: no DME, no falls, supervision for safety but per caregiver will run to chase after somethign she wants ADLs Comments: per caregiver, independent but supervised for safety     Extremity/Trunk Assessment   Upper Extremity Assessment Upper Extremity Assessment: Generalized weakness    Lower Extremity Assessment Lower Extremity Assessment: Generalized weakness;Difficult to assess due to impaired cognition (limited participation in MMT)    Cervical / Trunk Assessment Cervical / Trunk Assessment: Kyphotic  Communication   Communication Communication: Impaired Factors Affecting Communication: Reduced clarity of speech    Cognition Arousal: Lethargic Behavior During Therapy: Flat affect   PT - Cognitive impairments: No family/caregiver present to determine baseline, History of cognitive impairments, Awareness, Initiation, Sequencing, Safety/Judgement                       PT - Cognition Comments: no caregiver present in session, pt with limited command following at times in session, able to answer simple questions. Following commands: Impaired Following commands impaired: Follows one step commands with increased time     Cueing Cueing Techniques: Verbal cues     General Comments General comments (skin integrity, edema, etc.): VSS on RA, RN and MD present at end of  session, caregiver not present    Exercises     Assessment/Plan    PT Assessment Patient needs continued PT services  PT Problem List Decreased strength;Decreased activity tolerance;Decreased balance;Decreased mobility;Decreased cognition;Decreased safety awareness       PT Treatment Interventions DME instruction;Gait training;Stair training;Functional mobility training;Therapeutic activities;Therapeutic exercise;Balance training;Patient/family education    PT Goals (Current goals can be found in the Care Plan section)  Acute Rehab PT Goals Patient Stated Goal: to return home PT Goal Formulation: With patient Time For Goal Achievement: 05/28/24 Potential to Achieve Goals: Good    Frequency Min 2X/week        AM-PAC PT 6 Clicks Mobility  Outcome Measure Help needed turning from your back to your side while in a flat bed without using bedrails?: A Little Help needed moving from lying on your back to sitting on the side of a flat bed without using bedrails?: A Little Help needed moving to and from a bed to a chair (including a wheelchair)?: A Little Help needed standing up from a chair using your arms (e.g., wheelchair or bedside chair)?: A Little Help needed to walk in hospital room?: Total Help needed climbing 3-5 steps with a railing? : Total 6 Click Score: 14    End of Session Equipment Utilized During Treatment: Gait belt Activity Tolerance: Patient tolerated treatment well Patient left: in chair;with call bell/phone within reach;with chair alarm set;with nursing/sitter in room Nurse Communication: Mobility status PT Visit Diagnosis: Unsteadiness on feet (R26.81);Muscle weakness (generalized) (  M62.81)    Time: 8752-8691 PT Time Calculation (min) (ACUTE ONLY): 21 min   Charges:   PT Evaluation $PT Eval Low Complexity: 1 Low   PT General Charges $$ ACUTE PT VISIT: 1 Visit         Izetta Call, PT, DPT   Acute Rehabilitation Department Office  [phone] Secure Chat Communication Preferred  Izetta JULIANNA Call 05/14/2024, 2:47 PM

## 2024-05-14 NOTE — Plan of Care (Signed)
  Problem: Clinical Measurements: Goal: Ability to maintain clinical measurements within normal limits will improve Outcome: Progressing Goal: Respiratory complications will improve Outcome: Progressing Goal: Cardiovascular complication will be avoided Outcome: Progressing   Problem: Coping: Goal: Level of anxiety will decrease Outcome: Progressing   

## 2024-05-14 NOTE — Assessment & Plan Note (Signed)
 Medications have recently been adjusted, recently increased olanzapine  from 10 to 15 mg.  LFTs increased on admission, some concern may be 2/2 dose increase, though may also be reactive to infection and dehydration. AM CMP Continue olanzapine  15 mg nightly, Quetiapine  400 mg nightly Cymbalta  30 mg nightly, lorazepam  1 mg twice daily, Depakote  250 mg twice daily, citalopram  40 mg daily, Cogentin  1 mg daily

## 2024-05-14 NOTE — Progress Notes (Signed)
 "    Daily Progress Note Intern Pager: [phone]  Patient name: [name] Medical record number: [id number] Date of birth: 26-Oct-1975 Age: 49 y.o. Gender: female  Primary Care Provider: Donah Laymon PARAS, MD Consultants: None Code Status: Full  Pt Overview and Major Events to Date:  1/2-admitted to [facility] hospitalist service 1/3-transferred to family medicine service  Medical Decision Making:  [name] is a 49 year old female admitted for altered mental status and hyponatremia. 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  Mental status much improved though still fatigued and tired, pending labs. Assessment & Plan Altered mental status Intellectual disability Gradually improving mental status, awake and alert, though still quite tired.  Spoke with caregiver and stated that if she is wanting to eat more food outside her diet that is her baseline. Initial workup CT head without contrast, initially suspected 2/2 hypernatremia AM BMP pending, p.m. BMP ordered Continue hypernatremia treatment as below Influenza A virus present Positive on admission, stable on room air and afebrile viral persisting cough with altered mental status. Started Tamiflu  30 mg daily for 5 days, renally dosed Hypernatremia Initial sodium 157, gradually trending down to 152 yesterday evening, pending BMPs as mentioned above.  Serum osmolarity 341, no urine osmolality collected.  Primarily suspect due to poor p.o. intake.  Eating a little more, though still not her baseline. Continue D5W 100 mL/h until sodium WNL, at which point can be gradually tapered AM BMP pending, if sodium WNL can continue BMP twice daily.  If abnormal, continue every 6 hours serum sodium checks until normalized. Strict I's and O's, p.o. 450 overnight AKI (acute kidney injury) Baseline CKD stage III with baseline creatinine around 2-2.1,  elevated to 2.94 on admission.  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  Has received 2 units sliding scale last 24 hours. Sensitive SSI, will DC bedtime coverage due to concerns for hypoglycemia Pending a.m. A1c Continue holding Jardiance  10 mg daily due to AKI Schizophrenia (HCC) Schizoaffective disorder, bipolar type (HCC) Medications have recently been adjusted, recently increased olanzapine  from 10 to 15 mg.  LFTs increased on admission, some concern Jacqueline be 2/2 dose increase, though Jacqueline also be reactive to infection and dehydration. AM CMP Continue olanzapine  15 mg nightly, Quetiapine  400 mg nightly Cymbalta  30 mg nightly, lorazepam  1 mg twice daily, Depakote  250 mg twice daily, citalopram  40 mg daily, Cogentin  1 mg daily Chronic health problem GERD: Home Pepcid  20 mg twice daily, will switch to 40 mg daily HLD: Holding atorvastatin  20 mg due to elevated LFTs CKD stage III: Continue holding losartan  12.5 mg daily due to AKI   FEN/GI: Heart healthy/carb modified PPx: Subcu heparin  Dispo:Pending PT recommendations  pending clinical improvement . Barriers include altered mental status, hypernatremia.   Subjective:  Saw patient at bedside, she was alert and oriented, though sliding out of her recliner eating breakfast.  Had eaten a small portion of the breakfast and reports that she is feeling well.  Denies any abdominal pain, chest pain, shortness of breath, headaches, or changes in vision.  Reports that she is urinating frequently and without straining or dysuria.  Spoke with caregiver over phone and she reports that patient's usual baseline is when she starts requesting food that she should not be eating,  that is her usual indication of being at her functional  baseline.  Objective: Temp:  [97.6 F (36.4 C)-98.7 F (37.1 C)] 97.6 F (36.4 C) (01/03 0700) Pulse Rate:  [74-118] 74 (01/03 0700) Resp:  [11-18] 16 (01/03 0700) BP: (107-131)/(70-95) 119/70 (01/03 0700) SpO2:  [95 %-100 %] 100 % (01/03 0700) Weight:  [54.4 kg-62.1 kg] 62.1 kg (01/03 0300) Physical Exam: General: Resting comfortably, in no acute distress Cardiovascular: Regular rate and rhythm, normal S1/S2, no murmurs rubs or gallops Respiratory: Clear to auscultation bilaterally, no wheezes or crackles Abdomen: Active bowel sounds, nontender to light or deep palpation, soft abdomen Extremities: Nonedematous, nontender bilateral lower extremities  Laboratory: Most recent CBC Lab Results  Component Value Date   WBC 6.5 05/13/2024   HGB 12.6 05/13/2024   HCT 39.7 05/13/2024   MCV 92.1 05/13/2024   PLT 135 (L) 05/13/2024   Most recent BMP    Latest Ref Rng & Units 05/13/2024   11:21 PM  BMP  Sodium 135 - 145 mmol/L 152     Other pertinent labs serum sodium 155-152 CBG 129 BMP pending  Imaging/Diagnostic Tests: No new imaging  [name], MD 05/14/2024, 10:29 AM  PGY-1, [facility] FPTS Intern pager: [phone], text pages welcome Secure chat group [facility] Teaching Service   "

## 2024-05-14 NOTE — Hospital Course (Signed)
 [name] is a 49 y.o.female with a history of schizoaffective disorder, no actual disability, diabetes insipidus, T2DM who was admitted to the family medicine teaching Service at [facility] for altered mental status and hypernatremia. Her hospital course is detailed below:  Hypernatremia  altered mental status  influenza A Initially presented with decreased p.o. intake and excessive cough ongoing for the past several days.  CT head and CXR unremarkable.  Initial lab work found sodium 157, patient was started on hyponatremia protocol and started on D5/water, sodium gradually down trended w/inc IVF. Remained slightly altered worse than normal baseline and follow-up brain MRI on 05/17/23 found no significant change. Patient gradually returned back to baseline with IV and oral fluids and treatment of influenza for which she received symptomatic treatment and renally dosed Tamiflu .  Acute on chronic kidney injury Suspected to be primarily prerenal due to decreased p.o. intake, creatinine gradually improved with IV fluids and returned back to baseline.   Refeeding Syndrome  Hypophosphatemia  Patient had decrease phosphorus which was repleted during admission. Was eating less recently due to viral illness. Dietician was consulted as well.   Left Wrist Pain Pt s/p fall on 05/15/24 w/ left hand swollen. Left wrist radiograph show  a suspected transverse oblique intraarticular nondisplaced fracture of the mid to lateral aspect of the distal radial metaphysis. CT was obtained for confirmation which did not show acute fracture or dislocation.  Other chronic conditions were medically managed with home medications and formulary alternatives as necessary (schizoaffective disorder, intellectual disability, diabetes insipidus, T2DM)  PCP Follow-up Recommendations: F/u w/ repeat BMP w/ GFR s/p Hypernatremia  Monitoring LFT

## 2024-05-14 NOTE — Assessment & Plan Note (Signed)
 Last A1c 5.74 months ago, will repeat today.  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  Has received 2 units sliding scale last 24 hours. Sensitive SSI, will DC bedtime coverage due to concerns for hypoglycemia Pending a.m. A1c Continue holding Jardiance  10 mg daily due to AKI

## 2024-05-14 NOTE — Care Management Obs Status (Signed)
 MEDICARE OBSERVATION STATUS NOTIFICATION   Patient Details  Name: [name] MRN: [id number] Date of Birth: [birth year]   Medicare Observation Status Notification Given:  No (Unable to reach legal guardian to discuss MOON Letter)    Robynn Eileen Hoose, RN 05/14/2024, 3:18 PM

## 2024-05-14 NOTE — Progress Notes (Signed)
 Patient of Family medicine recently seen by their service.  Notified Dr Sherre and Placement RN to transfer this patient. Will take her off my rounding list.   Burgess Dare MD [facility]

## 2024-05-14 NOTE — Assessment & Plan Note (Addendum)
 Baseline CKD stage III with baseline creatinine around 2-2.1, elevated to 2.94 on admission.  Has remained on D5W Pending repeat CMP

## 2024-05-14 NOTE — Assessment & Plan Note (Signed)
 GERD: Home Pepcid  20 mg twice daily, will switch to 40 mg daily HLD: Holding atorvastatin  20 mg due to elevated LFTs CKD stage III: Continue holding losartan  12.5 mg daily due to AKI

## 2024-05-15 ENCOUNTER — Inpatient Hospital Stay (HOSPITAL_COMMUNITY)

## 2024-05-15 DIAGNOSIS — E232 Diabetes insipidus: Secondary | ICD-10-CM | POA: Diagnosis not present

## 2024-05-15 LAB — BASIC METABOLIC PANEL WITH GFR
Anion gap: 11 (ref 5–15)
Anion gap: 9 (ref 5–15)
Anion gap: 9 (ref 5–15)
BUN: 34 mg/dL — ABNORMAL HIGH (ref 6–20)
BUN: 35 mg/dL — ABNORMAL HIGH (ref 6–20)
BUN: 39 mg/dL — ABNORMAL HIGH (ref 6–20)
CO2: 23 mmol/L (ref 22–32)
CO2: 23 mmol/L (ref 22–32)
CO2: 23 mmol/L (ref 22–32)
Calcium: 8.7 mg/dL — ABNORMAL LOW (ref 8.9–10.3)
Calcium: 8.8 mg/dL — ABNORMAL LOW (ref 8.9–10.3)
Calcium: 9.2 mg/dL (ref 8.9–10.3)
Chloride: 114 mmol/L — ABNORMAL HIGH (ref 98–111)
Chloride: 117 mmol/L — ABNORMAL HIGH (ref 98–111)
Chloride: 118 mmol/L — ABNORMAL HIGH (ref 98–111)
Creatinine, Ser: 2.11 mg/dL — ABNORMAL HIGH (ref 0.44–1.00)
Creatinine, Ser: 2.12 mg/dL — ABNORMAL HIGH (ref 0.44–1.00)
Creatinine, Ser: 2.17 mg/dL — ABNORMAL HIGH (ref 0.44–1.00)
GFR, Estimated: 27 mL/min — ABNORMAL LOW
GFR, Estimated: 28 mL/min — ABNORMAL LOW
GFR, Estimated: 28 mL/min — ABNORMAL LOW
Glucose, Bld: 110 mg/dL — ABNORMAL HIGH (ref 70–99)
Glucose, Bld: 150 mg/dL — ABNORMAL HIGH (ref 70–99)
Glucose, Bld: 84 mg/dL (ref 70–99)
Potassium: 3.9 mmol/L (ref 3.5–5.1)
Potassium: 4.2 mmol/L (ref 3.5–5.1)
Potassium: 4.4 mmol/L (ref 3.5–5.1)
Sodium: 148 mmol/L — ABNORMAL HIGH (ref 135–145)
Sodium: 149 mmol/L — ABNORMAL HIGH (ref 135–145)
Sodium: 150 mmol/L — ABNORMAL HIGH (ref 135–145)

## 2024-05-15 LAB — COMPREHENSIVE METABOLIC PANEL WITH GFR
ALT: 47 U/L — ABNORMAL HIGH (ref 0–44)
AST: 86 U/L — ABNORMAL HIGH (ref 15–41)
Albumin: 2.9 g/dL — ABNORMAL LOW (ref 3.5–5.0)
Alkaline Phosphatase: 55 U/L (ref 38–126)
Anion gap: 11 (ref 5–15)
BUN: 35 mg/dL — ABNORMAL HIGH (ref 6–20)
CO2: 18 mmol/L — ABNORMAL LOW (ref 22–32)
Calcium: 8.7 mg/dL — ABNORMAL LOW (ref 8.9–10.3)
Chloride: 118 mmol/L — ABNORMAL HIGH (ref 98–111)
Creatinine, Ser: 2.01 mg/dL — ABNORMAL HIGH (ref 0.44–1.00)
GFR, Estimated: 30 mL/min — ABNORMAL LOW
Glucose, Bld: 132 mg/dL — ABNORMAL HIGH (ref 70–99)
Potassium: 4.3 mmol/L (ref 3.5–5.1)
Sodium: 147 mmol/L — ABNORMAL HIGH (ref 135–145)
Total Bilirubin: 0.4 mg/dL (ref 0.0–1.2)
Total Protein: 6.1 g/dL — ABNORMAL LOW (ref 6.5–8.1)

## 2024-05-15 LAB — URINALYSIS, ROUTINE W REFLEX MICROSCOPIC
Bilirubin Urine: NEGATIVE
Glucose, UA: >=500 mg/dL — AB
Hgb urine dipstick: NEGATIVE
Ketones, ur: NEGATIVE mg/dL
Leukocytes,Ua: NEGATIVE
Nitrite: NEGATIVE
Protein, ur: NEGATIVE mg/dL
Specific Gravity, Urine: 1.002 — ABNORMAL LOW (ref 1.005–1.030)
pH: 6 (ref 5.0–8.0)

## 2024-05-15 LAB — GLUCOSE, CAPILLARY
Glucose-Capillary: 139 mg/dL — ABNORMAL HIGH (ref 70–99)
Glucose-Capillary: 105 mg/dL — ABNORMAL HIGH (ref 70–99)
Glucose-Capillary: 125 mg/dL — ABNORMAL HIGH (ref 70–99)
Glucose-Capillary: 189 mg/dL — ABNORMAL HIGH (ref 70–99)

## 2024-05-15 LAB — HEMOGLOBIN A1C
Hgb A1c MFr Bld: 6.3 % — ABNORMAL HIGH (ref 4.8–5.6)
Mean Plasma Glucose: 134.11 mg/dL

## 2024-05-15 MED ORDER — LOSARTAN POTASSIUM 25 MG PO TABS
12.5000 mg | ORAL_TABLET | Freq: Every day | ORAL | Status: DC
  Administered 2024-05-15: 12.5 mg via ORAL
  Filled 2024-05-15: qty 0.5

## 2024-05-15 MED ORDER — DEXTROSE 5 % IV SOLN: INTRAVENOUS | Status: AC

## 2024-05-15 NOTE — Assessment & Plan Note (Addendum)
"  Baseline CKD stage III with baseline creatinine around 2-2.1, elevated to 2.94 on admission and now returned back to baseline. "

## 2024-05-15 NOTE — Plan of Care (Addendum)
 FMTS Interim Progress Note  S: Saw patient at bedside after nursing contacted us  provider regarding unwitnessed fall.  Patient reports that she has been feeling dizzy when she stands and still has pronounced leg weakness.  States that she stood and was feeling a little dizzy and tripped over her feet.  Initially said that she thought she did lose consciousness, then said that she did not.  Fell directly on left hip, no head injury.  Patient was ambulating with assistance to and from commode with no pain immediately following the fall.  Reports that she usually has color and spots in her vision which is her usual baseline, no headaches or tunnel vision.  O: BP 121/84 (BP Location: Right Arm)   Pulse 100   Temp (!) 97.5 F (36.4 C) (Oral)   Resp 18   Ht 5' 4 (1.626 m)   Wt 62.1 kg   SpO2 100%   BMI 23.50 kg/m    General: Well-appearing, in no acute distress, resting comfortably sitting at bedside Cardiovascular: Heart regular rate and rhythm, 2+ radial pulse MSK: 5/5 strength bilateral lower extremities for hip flexion, leg flexion, leg extension, dorsiflexion, plantarflexion, greater trochanter process and iliac crest nontender to palpation with no palpable bony abnormalities bilaterally Neuro: CN I through XII normal, bilateral upper and lower extremity strength 5/5, sensation grossly intact  A/P: Unwitnessed fall: No clear story whether loss of consciousness or not so will pursue syncope workup to rule out arrhythmia v.s. orthostatic hypotension v.s.  Autonomic dysfunction, minimal pain and tenderness or change with ability to ambulate post fall so lower concern for hip/pelvic fracture.  Fall most likely 2/2 weakness from refeeding syndrome/hypophosphatemia. Bilateral hip xray  Previously on telemetry constantly removing leads, no arrhythmias noted previously, will repeat EKG Orthostatic vitals Continue fall precautions Can consider tele sitter if concerns for increased  agitation  Altered mental status: Much improved from prior exam with significant changes from neurologic exam as well.  Reassuring for lower concern of CVA.  Still primarily suspect hyponatremia as etiology. Pending brain MRI Pending follow-up BMP to monitor serum sodium, called lab and reported they will be by at 1800 No other changes to previous plan  Hypophosphatemia/refeeding syndrome: Patient was previously eating very poor during initial viral prodrome.  Appetite has gradually been returning and appears to be getting adequate caloric intake. Continue heart healthy/carb modified diet RD consulted Monitoring daily BMP/mag/Phos daily and replating as indicated until electrolyte abnormalities stable for > 24 hours.  [name], MD 05/15/2024, 5:28 PM PGY-1, [facility] Family Medicine Service pager [phone]

## 2024-05-15 NOTE — Assessment & Plan Note (Addendum)
 A1c 6.3 today.   Has received 3 units sliding scale last 24 hours.  CBGs remain at goal Sensitive SSI hold Jardiance  10 mg daily to avoid diuresis Restart daily aspirin 

## 2024-05-15 NOTE — Plan of Care (Signed)
  Problem: Clinical Measurements: Goal: Ability to maintain clinical measurements within normal limits will improve Outcome: Progressing Goal: Respiratory complications will improve Outcome: Progressing Goal: Cardiovascular complication will be avoided Outcome: Progressing   Problem: Activity: Goal: Risk for activity intolerance will decrease Outcome: Progressing   

## 2024-05-15 NOTE — Assessment & Plan Note (Addendum)
"  GERD: Home Pepcid  20 mg twice daily, will switch to 40 mg daily HLD: Holding atorvastatin  20 mg due to elevated LFTs CKD stage III: Restart losartan  12.5 mg daily, monitor creatinine "

## 2024-05-15 NOTE — Evaluation (Signed)
 Occupational Therapy Evaluation Patient Details Name: [name] MRN: [id number] DOB: 1976-04-04 Today's Date: 05/15/2024   History of Present Illness   The pt is a 49 yo female presenting 1/2 with decreased po intake for 4 days, AMS. Found to have hypernatremia due to dehydration and influenza A. . PMH includes: intellectual disability, SVT, DM II, CKD III, HLD, and schizophrenia.     Clinical Impressions Per report, Pt was independent with functional mobility, required supervision for ADLs, and had assistance for IADLs such as medication management. Pt currently requires up to Mod A overall for ADL engagement and functional mobility. Pt is primarily limited by cognitive impairments, unsteadiness on feet, generalized weakness, and decreased activity tolerance. OT to continue to follow Pt acutely to facilitate progress towards goals. Recommend HHOT services at d/c pending progress.      If plan is discharge home, recommend the following:   A little help with walking and/or transfers;A little help with bathing/dressing/bathroom;Assistance with cooking/housework;Assistance with feeding;Direct supervision/assist for medications management;Direct supervision/assist for financial management;Assist for transportation;Help with stairs or ramp for entrance;Supervision due to cognitive status     Functional Status Assessment   Patient has had a recent decline in their functional status and demonstrates the ability to make significant improvements in function in a reasonable and predictable amount of time.     Equipment Recommendations   Other (comment) (to be determined)     Recommendations for Other Services         Precautions/Restrictions   Precautions Precautions: Fall Recall of Precautions/Restrictions: Impaired Restrictions Weight Bearing Restrictions Per Provider Order: No     Mobility Bed Mobility Overal bed mobility: Needs Assistance Bed Mobility: Sit to  Supine       Sit to supine: Contact guard assist   General bed mobility comments: CGA for safety to return to bed. Dense verbal cues for sequencing transfer    Transfers Overall transfer level: Needs assistance Equipment used: 1 person hand held assist Transfers: Sit to/from Stand Sit to Stand: Mod assist           General transfer comment: Pt required up to Mod HHA to rise from toilet and during ambulation. Occassional strong posterior lean requiring verbal and tactile cues to lean forward. Pt required cues throughout functional mobility to sequence task      Balance Overall balance assessment: Needs assistance Sitting-balance support: Single extremity supported, Feet supported Sitting balance-Leahy Scale: Poor Sitting balance - Comments: Required at least one UE propping on lap to maintain sitting balance Postural control: Posterior lean, Right lateral lean Standing balance support: Single extremity supported, Bilateral upper extremity supported, During functional activity, Reliant on assistive device for balance Standing balance-Leahy Scale: Poor Standing balance comment: Dependent on external support to maintain standing balance                           ADL either performed or assessed with clinical judgement   ADL Overall ADL's : Needs assistance/impaired Eating/Feeding: Set up;Sitting   Grooming: Wash/dry hands;Contact guard assist;Standing   Upper Body Bathing: Minimal assistance;Sitting   Lower Body Bathing: Moderate assistance;Sitting/lateral leans   Upper Body Dressing : Set up;Sitting   Lower Body Dressing: Moderate assistance;Sitting/lateral leans   Toilet Transfer: Moderate assistance;Cueing for safety;Cueing for sequencing;Ambulation;Comfort height toilet;Grab bars   Toileting- Clothing Manipulation and Hygiene: Moderate assistance;Cueing for sequencing;Sitting/lateral lean         General ADL Comments: Pt requires sequencing cues  throughout ADL tasks.  Pt with poor initiation and follow-through of tasks.     Vision   Vision Assessment?: No apparent visual deficits Additional Comments: Not formally assessed d/t cognition. Pt able to track objects and attend to therapist during communication     Perception         Praxis         Pertinent Vitals/Pain Pain Assessment Pain Assessment: Faces Faces Pain Scale: Hurts little more Pain Location: neck Pain Descriptors / Indicators: Sore Pain Intervention(s): Limited activity within patient's tolerance, Monitored during session     Extremity/Trunk Assessment Upper Extremity Assessment Upper Extremity Assessment: Generalized weakness   Lower Extremity Assessment Lower Extremity Assessment: Defer to PT evaluation       Communication Communication Communication: Impaired Factors Affecting Communication: Reduced clarity of speech;Difficulty expressing self   Cognition Arousal: Alert Behavior During Therapy: Flat affect Cognition: History of cognitive impairments             OT - Cognition Comments: intellectual disability                 Following commands: Impaired Following commands impaired: Follows one step commands with increased time     Cueing  General Comments   Cueing Techniques: Verbal cues;Visual cues;Tactile cues  Pt found standing at doorway by herself, requesting assistance to go to the bathroom. Pt about to walk into hallway alone. Pt guided back into room to complete toileting tasks on commode. Caregiver not present.   Exercises     Shoulder Instructions      Home Living Family/patient expects to be discharged to:: Group home Living Arrangements: Other (Comment) (caregiver and roommates) Available Help at Discharge: Personal care attendant Type of Home: House Home Access: Stairs to enter Secretary/administrator of Steps: 3 Entrance Stairs-Rails: Right;Left;Can reach both Home Layout: One level     Bathroom  Shower/Tub: Producer, Television/film/video: Handicapped height     Home Equipment: Shower seat;Grab bars - tub/shower;Hand held shower head   Additional Comments: Information gathered per chart review      Prior Functioning/Environment Prior Level of Function : Needs assist             Mobility Comments: no DME, no falls, supervision for safety but per caregiver will run to chase after somethign she wants ADLs Comments: per caregiver, independent but supervised for safety    OT Problem List: Decreased strength;Decreased activity tolerance;Impaired balance (sitting and/or standing);Decreased coordination;Decreased cognition;Decreased safety awareness;Decreased knowledge of use of DME or AE;Pain   OT Treatment/Interventions: Self-care/ADL training;Therapeutic exercise;Energy conservation;DME and/or AE instruction;Therapeutic activities;Patient/family education;Balance training      OT Goals(Current goals can be found in the care plan section)   Acute Rehab OT Goals Patient Stated Goal: use bathroom OT Goal Formulation: Patient unable to participate in goal setting Time For Goal Achievement: 05/29/24 Potential to Achieve Goals: Good ADL Goals Pt Will Perform Grooming: with supervision;standing Pt Will Transfer to Toilet: with supervision;ambulating Pt Will Perform Toileting - Clothing Manipulation and hygiene: with supervision Pt/caregiver will Perform Home Exercise Program: Increased strength;Both right and left upper extremity;With written HEP provided Additional ADL Goal #1: Pt will sequence and attend to 4 step ADL task with no more than 2 verbal cues to facilitate completion.   OT Frequency:  Min 2X/week    Co-evaluation              AM-PAC OT 6 Clicks Daily Activity     Outcome Measure Help from another person eating meals?: A  Little Help from another person taking care of personal grooming?: A Little Help from another person toileting, which includes  using toliet, bedpan, or urinal?: A Lot Help from another person bathing (including washing, rinsing, drying)?: A Lot Help from another person to put on and taking off regular upper body clothing?: A Little Help from another person to put on and taking off regular lower body clothing?: A Lot 6 Click Score: 15   End of Session Nurse Communication: Mobility status  Activity Tolerance: Patient tolerated treatment well Patient left: in bed;with call bell/phone within reach;with bed alarm set  OT Visit Diagnosis: Unsteadiness on feet (R26.81);Repeated falls (R29.6);Muscle weakness (generalized) (M62.81);History of falling (Z91.81);Other symptoms and signs involving cognitive function;Pain                Time: 1125-1145 OT Time Calculation (min): 20 min Charges:  OT General Charges $OT Visit: 1 Visit OT Evaluation $OT Eval Low Complexity: 1 Low  Maurilio CROME, OTR/L.  [facility] Acute Rehabilitation  Office: [phone]   Maurilio PARAS Preet Perrier 05/15/2024, 1:32 PM

## 2024-05-15 NOTE — Progress Notes (Signed)
 Pt had an unwitnessed fall at bedside and was found at approx 1655 when bed alarms were activated.  She alert and oriented to (person) baseline. Vitals WNL. She denies hitting her head. Pt appears calm and no injuries are noted at this time.   Pt assisted back to into bed by staff. Provider notified. Bed alarm, non-skid, socks and fall mats were in place at time of fall. Pt also had dinner and call bell at bedside, within reach.   Writer attempted to notify guardian, but was unsuccessful.   Pt is currently eating dinner. She has no complaints at this time. Pt reeducated on using call bell for assistance. Pt demonstrated understanding. Bed alarm on and tele sitter ordered.

## 2024-05-15 NOTE — Progress Notes (Addendum)
 "    Daily Progress Note Intern Pager: [phone]  Patient name: [name] Medical record number: [id number] Date of birth: 07-29-1975 Age: 49 y.o. Gender: female  Primary Care Provider: Donah Laymon PARAS, MD Consultants: None Code Status: Full  Pt Overview and Major Events to Date:  1/2-admitted to [facility] hospitalist service 1/3-transferred to family medicine service   Medical Decision Making:   [name] is a 49 year old female admitted for altered mental status and hyponatremia. 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 Assessment & Plan Altered mental status Intellectual disability Gradually improving mental status, very sleepy this morning, suspect likely due to antipsychotic medications and will reevaluate in the early afternoon.  Per caregiver, baseline is when she starts sneaking and asking for food that is outside of her diet.  Vital signs stable with minimal improvement of sodium over the last 20 hours.  Per PT evaluation, some concern for significant acute functional decline and will continue to monitor closely. 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 P.o. unchanged from yesterday, serum sodium remains largely unchanged over the last 20 hours.  Urine studies not collected.  Per charting still not eating much. Continue D5W 100 mL/h until sodium WNL, at which point can be tapered off if tolerating better p.o. Continue serum sodium check every 6 hours until hyponatremia resolves at which point resume BMPs twice daily Pending urine sodium and urine osmolality Strict I's and O's, p.o. 780 over the last 24 hours documented PT evaluated and following, pending OT AKI (acute kidney  injury) (Resolved: 05/15/2024) Baseline CKD stage III with baseline creatinine around 2-2.1, elevated to 2.94 on admission and now returned back to baseline.  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   Has received 3 units sliding scale last 24 hours.  CBGs remain at goal Sensitive SSI hold Jardiance  10 mg daily to avoid diuresis Restart daily aspirin  Schizophrenia (HCC) Schizoaffective disorder, bipolar type (HCC) Medications have recently been adjusted, recently increased olanzapine  from 10 to 15 mg.  LFTs increased on admission and gradually trending down. AM CMP Continue olanzapine  15 mg nightly, Quetiapine  400 mg nightly Cymbalta  30 mg nightly, lorazepam  1 mg twice daily, Depakote  250 mg twice daily, citalopram  40 mg daily, Cogentin  1 mg daily Consider consulting psych if LFT's remain abnormal Influenza A virus present Positive on admission, stable on room air and afebrile viral persisting cough with altered mental status. Continue Tamiflu  30 mg daily for 5 days, renally dosed Chronic health problem GERD: Home Pepcid  20 mg twice daily, will switch to 40 mg daily HLD: Holding atorvastatin  20 mg due to elevated LFTs CKD stage III: Restart losartan  12.5 mg daily, monitor creatinine   FEN/GI: Heart health/carb modified PPx: Heparin  SQ Dispo:Pending PT recommendations  pending clinical improvement . Barriers include mental status.   Subjective:  Saw patient at bedside, she was very sleepy, though intermittently alertable.  Frequently nodding off after awakening her.  Difficult to evaluate for mental status.  Objective: Temp:  [97.5 F (36.4 C)-98.6 F (37 C)] 98.6 F (37 C) (01/04 0504) Pulse Rate:  [73-91] 86 (01/04 0504) Resp:  [15-18] 15 (01/04 0504) BP: (112-123)/(75-89) 112/75 (01/04 0504) SpO2:  [100 %] 100 % (01/04 0504) Physical Exam: General: Sleepy, no acute distress Cardiovascular:  Regular rate and rhythm, normal  S1/S2, no murmurs rubs or gallops Respiratory: Clear to auscultation bilaterally, no wheezes or crackles Abdomen: Active bowel sounds, soft, nontender Extremities: Nonedematous, nontender bilateral lower extremities Skin: Warm and dry  Laboratory: Most recent CBC Lab Results  Component Value Date   WBC 6.5 05/13/2024   HGB 12.6 05/13/2024   HCT 39.7 05/13/2024   MCV 92.1 05/13/2024   PLT 135 (L) 05/13/2024   Most recent BMP    Latest Ref Rng & Units 05/14/2024   11:53 PM  BMP  Glucose 70 - 99 mg/dL 849   BUN 6 - 20 mg/dL 39   Creatinine 9.55 - 1.00 mg/dL 7.88   Sodium 864 - 854 mmol/L 149   Potassium 3.5 - 5.1 mmol/L 3.9   Chloride 98 - 111 mmol/L 117   CO2 22 - 32 mmol/L 23   Calcium  8.9 - 10.3 mg/dL 8.7     Other pertinent labs glucose 132-150-139-132 overnight Serum sodium 148-149-147-150 overnight Alk phos 55, AST 86, ALT 47 Hemoglobin A1c 6.3  Imaging/Diagnostic Tests: No new imaging  Jacqueline Pac, MD 05/15/2024, 7:32 AM  PGY-1, [place] [facility] FPTS Intern pager: [phone], text pages welcome Secure chat group [facility] Teaching Service   "

## 2024-05-15 NOTE — Plan of Care (Signed)
   Problem: Clinical Measurements: Goal: Will remain free from infection Outcome: Progressing Goal: Diagnostic test results will improve Outcome: Progressing Goal: Respiratory complications will improve Outcome: Progressing Goal: Cardiovascular complication will be avoided Outcome: Progressing   Problem: Nutrition: Goal: Adequate nutrition will be maintained Outcome: Progressing

## 2024-05-15 NOTE — Assessment & Plan Note (Addendum)
 Gradually improving mental status, very sleepy this morning, suspect likely due to antipsychotic medications and will reevaluate in the early afternoon.  Per caregiver, baseline is when she starts sneaking and asking for food that is outside of her diet.  Vital signs stable with minimal improvement of sodium over the last 20 hours.  Per PT evaluation, some concern for significant acute functional decline and will continue to monitor closely. Continue hypernatremia treatment as below Will reevaluate this afternoon If remains fatigued and altered, will plan for MRI AM CMP and CBC

## 2024-05-15 NOTE — Assessment & Plan Note (Addendum)
 Medications have recently been adjusted, recently increased olanzapine  from 10 to 15 mg.  LFTs increased on admission and gradually trending down. AM CMP Continue olanzapine  15 mg nightly, Quetiapine  400 mg nightly Cymbalta  30 mg nightly, lorazepam  1 mg twice daily, Depakote  250 mg twice daily, citalopram  40 mg daily, Cogentin  1 mg daily Consider consulting psych if LFT's remain abnormal

## 2024-05-15 NOTE — Plan of Care (Signed)
 FMTS Interim Progress Note  S: Reevaluated patient at bedside, she was sleeping but far more arousable this afternoon.  Was asking for a hot dog, then a cheeseburger, and hoping to get some orange juice.  Denies any headaches, changes in vision, difficulty speaking, or difficulty swallowing.  She had eaten almost all of her breakfast. Spoke with caregiver Rosella over the phone, she reports at baseline patient is a very sleepy person and would sleep all day if she could.  Also stated that she is a very stubborn person and often will not follow commands consistently, though she usually has enough strength and understanding to complete a full neuroexam.  Caregiver also does report that patient had a fall a few days ago when ambulating with another attack.  O: BP 108/82 (BP Location: Right Arm)   Pulse 89   Temp 98.3 F (36.8 C) (Oral)   Resp 18   Ht 5' 4 (1.626 m)   Wt 62.1 kg   SpO2 100%   BMI 23.50 kg/m    General: Sleepy but more arousable, overall responsive to commands and questions Neuro: Alert and oriented to self, not to place or time No changes in vision or hearing, extraocular motor muscles notable for diminished left upward gaze, no nystagmus, all other EOMI, remainder of CN exam 1-12 WNL Grip strength 2/5 bilaterally, 5/5 bilateral upper extremities, 5/5 right lower extremity, 2/5 left leg flexion all other muscles left LE 5/5 including knee extension/flexion, dorsiflexion and plantarflexion Sensation intact to light touch of bilateral upper and lower extremities and face Poor coordination and ability to follow commands to evaluate dysmetria Gait not evaluated  A/P: Altered mental status: Patient appears slightly worse from yesterday both in appearing awake and alert as well as ability to follow commands, though this is difficult to assess given baseline intellectual disability and report from caregiver.  Many of the neurologic findings may be secondary to intellectual  disability, though given only received head CT on admission and my exam is different from previous neuro evaluation, an evolving CVA is still possible.  Reassuringly, patient is requesting for more food. Plan for brain MRI, if there are any notable abnormalities we will plan to consult neuro Continue every 4 hour neuro checks Continue treatment of hyponatremia as below  Hypernatremia: Patient's appetite is increasing though has made minimal improvement of serum sodium today. Continue D5W at 100 mL/h with serum sodium checks every 6 hours  Jacqueline Pac, MD 05/15/2024, 1:05 PM PGY-1, [facility] Family Medicine Service pager [phone]

## 2024-05-15 NOTE — Assessment & Plan Note (Addendum)
 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 P.o. unchanged from yesterday, serum sodium remains largely unchanged over the last 20 hours.  Urine studies not collected.  Per charting still not eating much. Continue D5W 100 mL/h until sodium WNL, at which point can be tapered off if tolerating better p.o. Continue serum sodium check every 6 hours until hyponatremia resolves at which point resume BMPs twice daily Pending urine sodium and urine osmolality Strict I's and O's, p.o. 780 over the last 24 hours documented PT evaluated and following, pending OT

## 2024-05-15 NOTE — Plan of Care (Signed)
 FMTS Brief Progress Note  S: Patient is awake, sitting up in bed eating dinner on exam this evening. She reports no complaints at this time, though does appear to have spilled orange juice on herself, and is requesting a clean gown.   O: BP 121/84 (BP Location: Right Arm)   Pulse 100   Temp (!) 97.5 F (36.4 C) (Oral)   Resp 18   Ht 5' 4 (1.626 m)   Wt 62.1 kg   SpO2 100%   BMI 23.50 kg/m   General: Well appearing, no distress Cardiac: RRR, no m/r/g Respiratory: CTAB, no increased work of breathing Abdomen: Soft, non-tender, non-distended  A/P: Hypernatremia - BMP q6h ordered - if improving, will switch to D5 1/2 NS at same rate - if worsening will increase rate to 125 cc/hr  Fall - likely secondary to dizziness/orthostasis - bed alarms confirmed on - fall precautions/tele-sitter ordered - DG hip/pelvis w/o signs of fracture or bony abnormality - MRI ordered, not yet done  - Orders reviewed. Labs for AM ordered, which was adjusted as needed.  - If condition changes, plan includes rapid reassessment, stat labs to assess electrolytes.   Cleotilde Lukes, DO 05/15/2024, 7:57 PM PGY-2, [place] [facility] Night Resident  Please page [phone] with questions.

## 2024-05-15 NOTE — Assessment & Plan Note (Addendum)
 Positive on admission, stable on room air and afebrile viral persisting cough with altered mental status. - Continue Tamiflu  30 mg daily for 5 days, renally dosed

## 2024-05-16 ENCOUNTER — Inpatient Hospital Stay (HOSPITAL_COMMUNITY)

## 2024-05-16 DIAGNOSIS — E232 Diabetes insipidus: Secondary | ICD-10-CM | POA: Diagnosis not present

## 2024-05-16 DIAGNOSIS — G319 Degenerative disease of nervous system, unspecified: Secondary | ICD-10-CM | POA: Diagnosis not present

## 2024-05-16 LAB — BASIC METABOLIC PANEL WITH GFR
Anion gap: 9 (ref 5–15)
Anion gap: 11 (ref 5–15)
Anion gap: 10 (ref 5–15)
Anion gap: 10 (ref 5–15)
BUN: 39 mg/dL — ABNORMAL HIGH (ref 6–20)
BUN: 40 mg/dL — ABNORMAL HIGH (ref 6–20)
BUN: 38 mg/dL — ABNORMAL HIGH (ref 6–20)
BUN: 41 mg/dL — ABNORMAL HIGH (ref 6–20)
CO2: 23 mmol/L (ref 22–32)
CO2: 20 mmol/L — ABNORMAL LOW (ref 22–32)
CO2: 21 mmol/L — ABNORMAL LOW (ref 22–32)
CO2: 25 mmol/L (ref 22–32)
Calcium: 8.9 mg/dL (ref 8.9–10.3)
Calcium: 9 mg/dL (ref 8.9–10.3)
Calcium: 8.9 mg/dL (ref 8.9–10.3)
Calcium: 8.8 mg/dL — ABNORMAL LOW (ref 8.9–10.3)
Chloride: 115 mmol/L — ABNORMAL HIGH (ref 98–111)
Chloride: 117 mmol/L — ABNORMAL HIGH (ref 98–111)
Chloride: 115 mmol/L — ABNORMAL HIGH (ref 98–111)
Chloride: 113 mmol/L — ABNORMAL HIGH (ref 98–111)
Creatinine, Ser: 2.01 mg/dL — ABNORMAL HIGH (ref 0.44–1.00)
Creatinine, Ser: 1.9 mg/dL — ABNORMAL HIGH (ref 0.44–1.00)
Creatinine, Ser: 1.98 mg/dL — ABNORMAL HIGH (ref 0.44–1.00)
Creatinine, Ser: 2.13 mg/dL — ABNORMAL HIGH (ref 0.44–1.00)
GFR, Estimated: 30 mL/min — ABNORMAL LOW
GFR, Estimated: 32 mL/min — ABNORMAL LOW
GFR, Estimated: 30 mL/min — ABNORMAL LOW
GFR, Estimated: 28 mL/min — ABNORMAL LOW
Glucose, Bld: 156 mg/dL — ABNORMAL HIGH (ref 70–99)
Glucose, Bld: 116 mg/dL — ABNORMAL HIGH (ref 70–99)
Glucose, Bld: 144 mg/dL — ABNORMAL HIGH (ref 70–99)
Glucose, Bld: 114 mg/dL — ABNORMAL HIGH (ref 70–99)
Potassium: 4.8 mmol/L (ref 3.5–5.1)
Potassium: 5.1 mmol/L (ref 3.5–5.1)
Potassium: 4.8 mmol/L (ref 3.5–5.1)
Potassium: 4.8 mmol/L (ref 3.5–5.1)
Sodium: 147 mmol/L — ABNORMAL HIGH (ref 135–145)
Sodium: 148 mmol/L — ABNORMAL HIGH (ref 135–145)
Sodium: 146 mmol/L — ABNORMAL HIGH (ref 135–145)
Sodium: 148 mmol/L — ABNORMAL HIGH (ref 135–145)

## 2024-05-16 LAB — CBC
HCT: 37 % (ref 36.0–46.0)
Hemoglobin: 12 g/dL (ref 12.0–15.0)
MCH: 29.6 pg (ref 26.0–34.0)
MCHC: 32.4 g/dL (ref 30.0–36.0)
MCV: 91.1 fL (ref 80.0–100.0)
Platelets: 169 K/uL (ref 150–400)
RBC: 4.06 MIL/uL (ref 3.87–5.11)
RDW: 14 % (ref 11.5–15.5)
WBC: 6.9 K/uL (ref 4.0–10.5)
nRBC: 0.6 % — ABNORMAL HIGH (ref 0.0–0.2)

## 2024-05-16 LAB — HEPATIC FUNCTION PANEL
ALT: 46 U/L — ABNORMAL HIGH (ref 0–44)
AST: 68 U/L — ABNORMAL HIGH (ref 15–41)
Albumin: 3.1 g/dL — ABNORMAL LOW (ref 3.5–5.0)
Alkaline Phosphatase: 56 U/L (ref 38–126)
Bilirubin, Direct: <0.1 mg/dL (ref 0.0–0.2)
Total Bilirubin: 0.3 mg/dL (ref 0.0–1.2)
Total Protein: 6.3 g/dL — ABNORMAL LOW (ref 6.5–8.1)

## 2024-05-16 LAB — PHOSPHORUS: Phosphorus: 3.5 mg/dL (ref 2.5–4.6)

## 2024-05-16 LAB — GLUCOSE, CAPILLARY
Glucose-Capillary: 119 mg/dL — ABNORMAL HIGH (ref 70–99)
Glucose-Capillary: 176 mg/dL — ABNORMAL HIGH (ref 70–99)
Glucose-Capillary: 114 mg/dL — ABNORMAL HIGH (ref 70–99)
Glucose-Capillary: 155 mg/dL — ABNORMAL HIGH (ref 70–99)
Glucose-Capillary: 145 mg/dL — ABNORMAL HIGH (ref 70–99)
Glucose-Capillary: 108 mg/dL — ABNORMAL HIGH (ref 70–99)

## 2024-05-16 LAB — MAGNESIUM: Magnesium: 2.4 mg/dL (ref 1.7–2.4)

## 2024-05-16 MED ORDER — ENSURE PLUS HIGH PROTEIN PO LIQD: 237.0000 mL | Freq: Two times a day (BID) | ORAL | Status: DC

## 2024-05-16 MED ORDER — GLUCERNA SHAKE PO LIQD
237.0000 mL | Freq: Two times a day (BID) | ORAL | Status: DC
  Administered 2024-05-16 – 2024-05-18 (×6): 237 mL via ORAL

## 2024-05-16 MED ORDER — DEXTROSE 5 % IV SOLN: INTRAVENOUS | Status: DC

## 2024-05-16 NOTE — Assessment & Plan Note (Addendum)
 Positive on admission, stable on room air and afebrile viral persisting cough with altered mental status. - Continue Tamiflu  30 mg daily for 5 days, renally dosed

## 2024-05-16 NOTE — Assessment & Plan Note (Addendum)
 CBGs remain at goal - Sensitive SSI - Continue Hold Jardiance  10 mg daily to avoid diuresis

## 2024-05-16 NOTE — Progress Notes (Addendum)
" ° ° ° °  Daily Progress Note Intern Pager: [phone]  Patient name: [name] Medical record number: [id number] Date of birth: Jan 04, 1976 Age: 49 y.o. Gender: female  Primary Care Provider: Donah Laymon PARAS, MD Consultants: None Code Status: Full Code   Pt Overview and Major Events to Date:  1/2-admitted to [facility] service 1/3-transferred to family medicine service Assessment & Plan Altered mental status Intellectual disability Sleepy this morning during rounds. Likely from PM Psych meds. 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 Na 148 this morning - Increase D5W to 125 mL/h  - Pending urine sodium and urine osmolality - BMP every 6 hours.  - Strict I's and O's, p.o.  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 AST trending down w/ Stable ALT - Continue olanzapine  15 mg nightly, Quetiapine  400 mg nightly Cymbalta  30 mg nightly, lorazepam  1 mg BID, Depakote  250 mg BID, citalopram  40 mg daily, Cogentin  1 mg daily - continue to monitor LFT  - Daily CMP Influenza A virus present Positive on admission, stable on room air and afebrile viral persisting cough with altered mental status. - Continue Tamiflu  30 mg daily for 5 days, renally dosed Chronic health problem GERD: Home Pepcid  20 mg twice daily, will switch to 40 mg daily HLD: Holding atorvastatin  20 mg due to elevated LFTs CKD stage IV : Holding  losartan  12.5 mg daily, monitor creatinine   FEN/GI: Heart health/carb  modified  PPx: SQH Dispo: Pending PT recommendations from clinical improvement  Subjective:  Difficult to wake patient up this morning. She appear lethargic.   Objective: Temp:  [97.5 F (36.4 C)-98.3 F (36.8 C)] 98.3 F (36.8 C) (01/05 0733) Pulse Rate:  [90-100] 95 (01/05 0733) Resp:  [14-18] 14 (01/05 0733) BP: (96-125)/(75-86) 97/75 (01/05 0733) SpO2:  [99 %-100 %] 100 % (01/05 0733) Physical Exam: General: Sleepy, no acute distress  Cardiovascular: S1S2 Respiratory: On RA, No distress Abdomen: Soft, Non-tender   Laboratory: Most recent CBC Lab Results  Component Value Date   WBC 6.9 05/16/2024   HGB 12.0 05/16/2024   HCT 37.0 05/16/2024   MCV 91.1 05/16/2024   PLT 169 05/16/2024   Most recent BMP    Latest Ref Rng & Units 05/16/2024   10:42 AM  BMP  Glucose 70 - 99 mg/dL 883   BUN 6 - 20 mg/dL 40   Creatinine 9.55 - 1.00 mg/dL 8.09   Sodium 864 - 854 mmol/L 148   Potassium 3.5 - 5.1 mmol/L 5.1   Chloride 98 - 111 mmol/L 117   CO2 22 - 32 mmol/L 20   Calcium  8.9 - 10.3 mg/dL 9.0      Imaging/Diagnostic Tests: EXAM: MRI HEAD WITHOUT CONTRAST  IMPRESSION: Moderate cerebral atrophy similar to March 20, 2021  Suzen Houston NOVAK, DO 05/16/2024, 1:07 PM  PGY-1, [facility] FPTS Intern pager: [phone], text pages welcome Secure chat group [facility] Medicine Teaching Service   "

## 2024-05-16 NOTE — Assessment & Plan Note (Addendum)
"  GERD: Home Pepcid  20 mg twice daily, will switch to 40 mg daily HLD: Holding atorvastatin  20 mg due to elevated LFTs CKD stage IV : Holding  losartan  12.5 mg daily, monitor creatinine "

## 2024-05-16 NOTE — Plan of Care (Signed)
 FMTS Brief Progress Note  S:Evaluated at bedside with Dr. Jerrie. Reports some left hip pain after a fall while walking earlier. Otherwise no concerns.    O: BP 100/71 (BP Location: Right Arm)   Pulse (!) 101   Temp 98.7 F (37.1 C) (Oral)   Resp 16   Ht 5' 4 (1.626 m)   Wt 62.1 kg   SpO2 100%   BMI 23.50 kg/m   General: well appearing, NAD Cardiovascular: RRR, no m/r/g Respiratory: normal work of breathing on RA, CTAB Abdomen: Normal bowel sounds, soft, non-tender Extremities: No swelling BLE  A/P: Altered mental status, hypernatremia She is not altered at this time. Continue D5W 125 mL/h, will monitor BMP every 6 hours.  Will increase IV fluids as needed depending on sodium. Rest of plan per day team  L Hip pain Suspect it is likely sore from falling on it.  No bruising, redness, or deformity to the hip. Will continue to monitor, if it is still bothering her in the morning could consider an imaging/further workup  - Orders reviewed. Labs for AM ordered, which was adjusted as needed.   Haruka Kowaleski, DO 05/16/2024, 10:05 PM PGY-2, [place] [facility] Night Resident  Please page [phone] with questions.

## 2024-05-16 NOTE — Plan of Care (Signed)
" ° °  PM round on the patient. She is more awake and able to make conversation. She is aware that she is in a hospital and able to answer questions appropriately. We will continue to monitor her mental status w/ pending PM BMP result "

## 2024-05-16 NOTE — Progress Notes (Addendum)
 Initial Nutrition Assessment  DOCUMENTATION CODES:  Not applicable  INTERVENTION:  Glucerna Shake PO BID. Each supplement provides 220 Kcals and 10 grams of protein. Low-sodium diet. Room service assistance. Feeding assistance/meal setup assistance.  NUTRITION DIAGNOSIS:  Altered nutrition lab value related to acute illness as evidenced by  (hypernatremia)   GOAL:  Patient will meet greater than or equal to 90% of their needs   MONITOR:  PO intake, Labs, Weight trends  REASON FOR ASSESSMENT:  Consult Assessment of nutrition requirement/status, Poor PO (concern for refeeding risk)  ASSESSMENT:  Patient presented with altered mental status and hypernatremia, Flu+. 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  Visited the patient who did not awaken from sleep. Breakfast tray at bedside untouched. No signs/symptoms of refeeding syndrome at this time. Patient meal intake charted to be 75-100%. Patient's caretaker reported that the patient often sneaks food that is non-compliant with diet restrictions.  Scheduled Meds:  benztropine   1 mg Oral Daily   citalopram   40 mg Oral Daily   divalproex   250 mg Oral BID   DULoxetine   30 mg Oral QHS   famotidine   20 mg Oral Daily   feeding supplement (GLUCERNA SHAKE)  237 mL Oral BID BM   heparin  injection (subcutaneous)  5,000 Units Subcutaneous Q8H   insulin  aspart  0-9 Units Subcutaneous TID WC   LORazepam   1 mg Oral BID   OLANZapine   15 mg Oral QHS   oseltamivir   30 mg Oral Daily   QUEtiapine   400 mg Oral QHS  No electrolyte replacement charted  Continuous Infusions:  dextrose  Stopped (05/16/24 0813)   PRN Meds:.acetaminophen  **OR** acetaminophen , ondansetron  **OR** ondansetron  (ZOFRAN ) IV  Diet Order             Diet 2 gram sodium Room service appropriate? Yes with Assist; Fluid consistency: Thin  Diet effective now                   Meal Intake: 75-100%  Labs:     Latest Ref Rng & Units 05/16/2024    3:45 AM 05/15/2024    5:45 PM 05/15/2024    9:33 AM  CMP  Glucose 70 - 99 mg/dL 843  84  889   BUN 6 - 20 mg/dL 39  35  34   Creatinine 0.44 - 1.00 mg/dL 7.98  7.82  7.87   Sodium 135 - 145 mmol/L 147  148  150   Potassium 3.5 - 5.1 mmol/L 4.8  4.2  4.4   Chloride 98 - 111 mmol/L 115  114  118   CO2 22 - 32 mmol/L 23  23  23    Calcium  8.9 - 10.3 mg/dL 8.9  9.2  8.8   Total Protein 6.5 - 8.1 g/dL 6.3     Total Bilirubin 0.0 - 1.2 mg/dL 0.3     Alkaline Phos 38 - 126 U/L 56     AST 15 - 41 U/L 68     ALT 0 - 44 U/L 46        I/O: +4 L since admit  NUTRITION - FOCUSED PHYSICAL EXAM: Flowsheet Row Most Recent Value  Orbital Region No depletion  Upper Arm Region No depletion  Thoracic and Lumbar Region No depletion  Buccal Region No depletion  Temple Region Mild depletion  Clavicle Bone Region No depletion  Clavicle and Acromion Bone Region No depletion  Scapular Bone Region No depletion  Dorsal Hand Mild depletion  Patellar Region Severe depletion  Anterior Thigh Region Moderate depletion  Posterior Calf Region Severe depletion  Edema (RD Assessment) None  Hair Reviewed  Eyes Reviewed  Mouth Reviewed  Skin Reviewed  Nails Reviewed    EDUCATION NEEDS:  Not appropriate for education at this time  Skin:  Skin Assessment: Reviewed RN Assessment  Last BM:  PTA  Height:  Ht Readings from Last 1 Encounters:  05/13/24 5' 4 (1.626 m)   Weight:  Wt Readings from Last 10 Encounters:  05/14/24 62.1 kg  05/10/24 58.9 kg  04/18/24 63 kg  01/07/24 62.1 kg  10/19/23 59.1 kg  08/03/23 55.7 kg  04/07/23 55.8 kg  03/19/23 59 kg  03/17/23 54.9 kg  02/17/23 57.5 kg   Weight Change: relatively stable weight noted  Usual Body Weight: unable to determine  Edema: none charted  Ideal Body Weight:  55 kg   BMI:  Body mass index is 23.5 kg/m.  Estimated Daily Nutritional  Needs:  Kcal:  1500-1700 Protein:  70-90 g Fluid:  1500-1700 mL    Leverne Ruth, MS, RDN, LDN [place] See AMION for contact information Secure chat preferred

## 2024-05-16 NOTE — Assessment & Plan Note (Addendum)
"  Sleepy this morning during rounds. Likely from PM Psych meds. Brain MRI today show Moderate cerebral atrophy, no change - Continue hypernatremia treatment as below - Continue Delirium precautions  - Will consider psychiatry consult if mental status not improving once sodium normalizing "

## 2024-05-16 NOTE — Assessment & Plan Note (Addendum)
 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 Na 148 this morning - Increase D5W to 125 mL/h  - Pending urine sodium and urine osmolality - BMP every 6 hours.  - Strict I's and O's, p.o.  - PT/OT

## 2024-05-16 NOTE — Plan of Care (Signed)
" °  Problem: Health Behavior/Discharge Planning: Goal: Ability to manage health-related needs will improve Outcome: Progressing   Problem: Clinical Measurements: Goal: Ability to maintain clinical measurements within normal limits will improve Outcome: Progressing Goal: Will remain free from infection Outcome: Progressing Goal: Diagnostic test results will improve Outcome: Progressing Goal: Respiratory complications will improve Outcome: Progressing Goal: Cardiovascular complication will be avoided Outcome: Progressing   Problem: Activity: Goal: Risk for activity intolerance will decrease Outcome: Progressing   Problem: Nutrition: Goal: Adequate nutrition will be maintained Outcome: Progressing   Problem: Coping: Goal: Level of anxiety will decrease Outcome: Progressing   Problem: Elimination: Goal: Will not experience complications related to bowel motility Outcome: Progressing Goal: Will not experience complications related to urinary retention Outcome: Progressing   Problem: Pain Managment: Goal: General experience of comfort will improve and/or be controlled Outcome: Progressing   Problem: Safety: Goal: Ability to remain free from injury will improve Outcome: Progressing   Problem: Skin Integrity: Goal: Risk for impaired skin integrity will decrease Outcome: Progressing   Problem: Education: Goal: Ability to describe self-care measures that may prevent or decrease complications (Diabetes Survival Skills Education) will improve Outcome: Progressing Goal: Individualized Educational Video(s) Outcome: Progressing   Problem: Coping: Goal: Ability to adjust to condition or change in health will improve Outcome: Progressing   Problem: Fluid Volume: Goal: Ability to maintain a balanced intake and output will improve Outcome: Progressing   Problem: Health Behavior/Discharge Planning: Goal: Ability to identify and utilize available resources and services will  improve Outcome: Progressing Goal: Ability to manage health-related needs will improve Outcome: Progressing   Problem: Metabolic: Goal: Ability to maintain appropriate glucose levels will improve Outcome: Progressing   Problem: Nutritional: Goal: Maintenance of adequate nutrition will improve Outcome: Progressing Goal: Progress toward achieving an optimal weight will improve Outcome: Progressing   Problem: Skin Integrity: Goal: Risk for impaired skin integrity will decrease Outcome: Progressing   Problem: Tissue Perfusion: Goal: Adequacy of tissue perfusion will improve Outcome: Progressing   Problem: Education: Goal: Knowledge of General Education information will improve Description: Including pain rating scale, medication(s)/side effects and non-pharmacologic comfort measures Outcome: Not Progressing   "

## 2024-05-16 NOTE — Assessment & Plan Note (Addendum)
 Somnolence in AM possible from her psych medications. AST trending down w/ Stable ALT - Continue olanzapine  15 mg nightly, Quetiapine  400 mg nightly Cymbalta  30 mg nightly, lorazepam  1 mg BID, Depakote  250 mg BID, citalopram  40 mg daily, Cogentin  1 mg daily - continue to monitor LFT  - Daily CMP

## 2024-05-16 NOTE — Progress Notes (Signed)
 Occupational Therapy Treatment Patient Details Name: [name] MRN: [id number] DOB: 11-Jun-1975 Today's Date: 05/16/2024   History of present illness The pt is a 49 yo female presenting 1/2 with decreased po intake for 4 days, AMS. Found to have hypernatremia due to dehydration and influenza A. . PMH includes: intellectual disability, SVT, DM II, CKD III, HLD, and schizophrenia.   OT comments  Pt is progressing towards OT goals. Focus of session on progressing functional mobility, increasing independence in ADL tasks, as well as following commands and sequencing tasks. Pt required up to Min A for functional transfers with RW, and Mod A for ADL tasks. Pt continues to require dense verbal cues for problem solving and task initiation. OT to continue per POC.       If plan is discharge home, recommend the following:  A little help with walking and/or transfers;A little help with bathing/dressing/bathroom;Assistance with cooking/housework;Assistance with feeding;Direct supervision/assist for medications management;Direct supervision/assist for financial management;Assist for transportation;Help with stairs or ramp for entrance;Supervision due to cognitive status   Equipment Recommendations  BSC/3in1    Recommendations for Other Services      Precautions / Restrictions Precautions Precautions: Fall Recall of Precautions/Restrictions: Impaired Restrictions Weight Bearing Restrictions Per Provider Order: No       Mobility Bed Mobility Overal bed mobility: Needs Assistance Bed Mobility: Supine to Sit     Supine to sit: Min assist, HOB elevated     General bed mobility comments: Min A trunk facilitation to rise from bed. Increased time required    Transfers Overall transfer level: Needs assistance Equipment used: Rolling walker (2 wheels) Transfers: Sit to/from Stand Sit to Stand: Min assist           General transfer comment: Min A to rise from EOB with HHA, utilized RW to  ambulate to commode in bathroom. Min A management of RW     Balance Overall balance assessment: Needs assistance Sitting-balance support: No upper extremity supported, Feet supported Sitting balance-Leahy Scale: Fair     Standing balance support: Bilateral upper extremity supported, During functional activity, Reliant on assistive device for balance Standing balance-Leahy Scale: Poor Standing balance comment: Dependent on external support to maintain standing balance                           ADL either performed or assessed with clinical judgement   ADL Overall ADL's : Needs assistance/impaired Eating/Feeding: Set up;Sitting                   Lower Body Dressing: Moderate assistance   Toilet Transfer: Minimal assistance;Ambulation;Comfort height toilet;Rolling walker (2 wheels);Grab bars Toilet Transfer Details (indicate cue type and reason): Min A management of RW and sequencing cues Toileting- Clothing Manipulation and Hygiene: Minimal assistance Toileting - Clothing Manipulation Details (indicate cue type and reason): Min A management of clothing and cues for obtaining toilet paper       General ADL Comments: Addressed problem solving and sequencing during ADL tasks this session    Extremity/Trunk Assessment Upper Extremity Assessment Upper Extremity Assessment: Generalized weakness            Vision       Perception     Praxis     Communication Communication Communication: Impaired Factors Affecting Communication: Reduced clarity of speech;Difficulty expressing self   Cognition Arousal: Alert Behavior During Therapy: Flat affect Cognition: History of cognitive impairments  OT - Cognition Comments: intellectual disability                 Following commands: Impaired Following commands impaired: Follows one step commands with increased time      Cueing   Cueing Techniques: Verbal cues, Visual cues, Tactile cues,  Gestural cues  Exercises      Shoulder Instructions       General Comments Pt care handed off to PT    Pertinent Vitals/ Pain       Pain Assessment Pain Assessment: Faces Faces Pain Scale: Hurts little more Pain Location: L hip Pain Descriptors / Indicators: Sore Pain Intervention(s): Monitored during session  Home Living                                          Prior Functioning/Environment              Frequency  Min 2X/week        Progress Toward Goals  OT Goals(current goals can now be found in the care plan section)  Progress towards OT goals: Progressing toward goals  Acute Rehab OT Goals Patient Stated Goal: get orange juice OT Goal Formulation: Patient unable to participate in goal setting Time For Goal Achievement: 05/29/24 Potential to Achieve Goals: Good ADL Goals Pt Will Perform Grooming: with supervision;standing Pt Will Transfer to Toilet: with supervision;ambulating Pt Will Perform Toileting - Clothing Manipulation and hygiene: with supervision Pt/caregiver will Perform Home Exercise Program: Increased strength;Both right and left upper extremity;With written HEP provided Additional ADL Goal #1: Pt will sequence and attend to 4 step ADL task with no more than 2 verbal cues to facilitate completion.  Plan      Co-evaluation                 AM-PAC OT 6 Clicks Daily Activity     Outcome Measure   Help from another person eating meals?: A Little Help from another person taking care of personal grooming?: A Little Help from another person toileting, which includes using toliet, bedpan, or urinal?: A Little Help from another person bathing (including washing, rinsing, drying)?: A Lot Help from another person to put on and taking off regular upper body clothing?: A Little Help from another person to put on and taking off regular lower body clothing?: A Lot 6 Click Score: 16    End of Session Equipment Utilized  During Treatment: Gait belt;Rolling walker (2 wheels)  OT Visit Diagnosis: Unsteadiness on feet (R26.81);Repeated falls (R29.6);Muscle weakness (generalized) (M62.81);History of falling (Z91.81);Other symptoms and signs involving cognitive function;Pain   Activity Tolerance Patient tolerated treatment well   Patient Left Other (comment) (Care handed off to PT)   Nurse Communication          Time: 8480-8466 OT Time Calculation (min): 14 min  Charges: OT General Charges $OT Visit: 1 Visit OT Treatments $Self Care/Home Management : 8-22 mins  Maurilio CROME, OTR/L.  [facility] Acute Rehabilitation  Office: [phone]   Maurilio PARAS Manus Weedman 05/16/2024, 4:25 PM

## 2024-05-16 NOTE — Progress Notes (Signed)
 Physical Therapy Treatment Patient Details Name: [name] MRN: [id number] DOB: [birth year] Today's Date: 05/16/2024   History of Present Illness The pt is a 49 yo female presenting 1/2 with decreased po intake for 4 days, AMS. Found to have hypernatremia due to dehydration and influenza A. . PMH includes: intellectual disability, SVT, DM II, CKD III, HLD, and schizophrenia.    PT Comments  Took over from OT session and pt able to walk both with and without UE support (RW vs no support). With RW she takes extremely small steps, which did incr in length when assisted pt with pushing RW faster. However, she reverted back to small steps if assist through RW faded out. Without device she took longer steps more naturally. No imbalance during transfers or ambulation. Returned to bed as concern for pt to stay in chair as NT reports she's been trying to climb OOB today.     If plan is discharge home, recommend the following: A lot of help with walking and/or transfers;A little help with bathing/dressing/bathroom;Assistance with cooking/housework;Direct supervision/assist for medications management;Direct supervision/assist for financial management;Assist for transportation;Supervision due to cognitive status   Can travel by private vehicle        Equipment Recommendations  None recommended by PT    Recommendations for Other Services       Precautions / Restrictions Precautions Precautions: Fall Recall of Precautions/Restrictions: Impaired Restrictions Weight Bearing Restrictions Per Provider Order: No     Mobility  Bed Mobility Overal bed mobility: Needs Assistance Bed Mobility: Sit to Supine       Sit to supine: Supervision   General bed mobility comments: cues for scooting to middle of bed once supine    Transfers Overall transfer level: Needs assistance Equipment used: 1 person hand held assist, Rolling walker (2 wheels) Transfers: Sit to/from Stand Sit to Stand: Min  assist           General transfer comment: from EOB without device with slow movement but not much physical assist; from toilet with grab bar and RW with min assist    Ambulation/Gait Ambulation/Gait assistance: Min assist Gait Distance (Feet): 15 Feet (RW; toileted; 25 ft RW; 15 ft no device) Assistive device: Rolling walker (2 wheels), None Gait Pattern/deviations: Step-to pattern, Decreased stride length, Shuffle, Narrow base of support, Step-through pattern Gait velocity: decreased     General Gait Details: with RW takes extremely small steps; if assist by pushing RW for her she will increase step length and velocity; with no device does better   Stairs             Wheelchair Mobility     Tilt Bed    Modified Rankin (Stroke Patients Only)       Balance Overall balance assessment: Needs assistance Sitting-balance support: Feet supported, No upper extremity supported Sitting balance-Leahy Scale: Fair     Standing balance support: Single extremity supported, Bilateral upper extremity supported, During functional activity, Reliant on assistive device for balance Standing balance-Leahy Scale: Poor Standing balance comment: Dependent on external support to maintain standing balance                            Communication Communication Communication: Impaired Factors Affecting Communication: Reduced clarity of speech;Difficulty expressing self  Cognition Arousal: Alert Behavior During Therapy: Flat affect   PT - Cognitive impairments: No family/caregiver present to determine baseline, History of cognitive impairments, Awareness, Initiation, Sequencing, Safety/Judgement  Following commands: Impaired Following commands impaired: Follows one step commands with increased time    Cueing Cueing Techniques: Verbal cues, Visual cues, Tactile cues, Gestural cues  Exercises      General Comments General comments (skin  integrity, edema, etc.): Working with OT on arrival.      Pertinent Vitals/Pain Pain Assessment Pain Assessment: Faces Faces Pain Scale: No hurt    Home Living                          Prior Function            PT Goals (current goals can now be found in the care plan section) Acute Rehab PT Goals Patient Stated Goal: to return home PT Goal Formulation: With patient Time For Goal Achievement: 05/28/24 Potential to Achieve Goals: Good Progress towards PT goals: Progressing toward goals    Frequency    Min 2X/week      PT Plan      Co-evaluation              AM-PAC PT 6 Clicks Mobility   Outcome Measure  Help needed turning from your back to your side while in a flat bed without using bedrails?: A Little Help needed moving from lying on your back to sitting on the side of a flat bed without using bedrails?: A Little Help needed moving to and from a bed to a chair (including a wheelchair)?: A Little Help needed standing up from a chair using your arms (e.g., wheelchair or bedside chair)?: A Little Help needed to walk in hospital room?: A Little Help needed climbing 3-5 steps with a railing? : A Little 6 Click Score: 18    End of Session Equipment Utilized During Treatment: Gait belt Activity Tolerance: Patient tolerated treatment well Patient left: with call bell/phone within reach;in bed;with bed alarm set Nurse Communication: Mobility status PT Visit Diagnosis: Unsteadiness on feet (R26.81);Muscle weakness (generalized) (M62.81)     Time: 8469-8456 PT Time Calculation (min) (ACUTE ONLY): 13 min  Charges:    $Gait Training: 8-22 mins PT General Charges $$ ACUTE PT VISIT: 1 Visit                      [name], PT Acute Rehabilitation Services  Office [phone]    [name] 05/16/2024, 3:59 PM

## 2024-05-17 ENCOUNTER — Inpatient Hospital Stay (HOSPITAL_COMMUNITY)

## 2024-05-17 ENCOUNTER — Ambulatory Visit: Payer: Self-pay | Admitting: Student

## 2024-05-17 DIAGNOSIS — S62102A Fracture of unspecified carpal bone, left wrist, initial encounter for closed fracture: Secondary | ICD-10-CM | POA: Insufficient documentation

## 2024-05-17 DIAGNOSIS — E232 Diabetes insipidus: Secondary | ICD-10-CM | POA: Diagnosis not present

## 2024-05-17 LAB — GLUCOSE, CAPILLARY
Glucose-Capillary: 158 mg/dL — ABNORMAL HIGH (ref 70–99)
Glucose-Capillary: 153 mg/dL — ABNORMAL HIGH (ref 70–99)
Glucose-Capillary: 95 mg/dL (ref 70–99)
Glucose-Capillary: 127 mg/dL — ABNORMAL HIGH (ref 70–99)
Glucose-Capillary: 122 mg/dL — ABNORMAL HIGH (ref 70–99)

## 2024-05-17 LAB — BASIC METABOLIC PANEL WITH GFR
Anion gap: 9 (ref 5–15)
Anion gap: 9 (ref 5–15)
Anion gap: 9 (ref 5–15)
BUN: 43 mg/dL — ABNORMAL HIGH (ref 6–20)
BUN: 44 mg/dL — ABNORMAL HIGH (ref 6–20)
BUN: 46 mg/dL — ABNORMAL HIGH (ref 6–20)
CO2: 25 mmol/L (ref 22–32)
CO2: 23 mmol/L (ref 22–32)
CO2: 23 mmol/L (ref 22–32)
Calcium: 8.6 mg/dL — ABNORMAL LOW (ref 8.9–10.3)
Calcium: 8.8 mg/dL — ABNORMAL LOW (ref 8.9–10.3)
Calcium: 8.7 mg/dL — ABNORMAL LOW (ref 8.9–10.3)
Chloride: 111 mmol/L (ref 98–111)
Chloride: 107 mmol/L (ref 98–111)
Chloride: 105 mmol/L (ref 98–111)
Creatinine, Ser: 1.96 mg/dL — ABNORMAL HIGH (ref 0.44–1.00)
Creatinine, Ser: 1.94 mg/dL — ABNORMAL HIGH (ref 0.44–1.00)
Creatinine, Ser: 1.97 mg/dL — ABNORMAL HIGH (ref 0.44–1.00)
GFR, Estimated: 30 mL/min — ABNORMAL LOW
GFR, Estimated: 31 mL/min — ABNORMAL LOW
GFR, Estimated: 30 mL/min — ABNORMAL LOW
Glucose, Bld: 146 mg/dL — ABNORMAL HIGH (ref 70–99)
Glucose, Bld: 112 mg/dL — ABNORMAL HIGH (ref 70–99)
Glucose, Bld: 129 mg/dL — ABNORMAL HIGH (ref 70–99)
Potassium: 4.7 mmol/L (ref 3.5–5.1)
Potassium: 4.9 mmol/L (ref 3.5–5.1)
Potassium: 5 mmol/L (ref 3.5–5.1)
Sodium: 145 mmol/L (ref 135–145)
Sodium: 139 mmol/L (ref 135–145)
Sodium: 137 mmol/L (ref 135–145)

## 2024-05-17 MED ORDER — SENNOSIDES-DOCUSATE SODIUM 8.6-50 MG PO TABS
1.0000 | ORAL_TABLET | Freq: Every day | ORAL | Status: DC
  Filled 2024-05-17: qty 1

## 2024-05-17 MED ORDER — POLYETHYLENE GLYCOL 3350 17 G PO PACK
17.0000 g | PACK | Freq: Every day | ORAL | Status: DC
  Administered 2024-05-17 – 2024-05-18 (×2): 17 g via ORAL
  Filled 2024-05-17 (×2): qty 1

## 2024-05-17 MED ORDER — ORAL CARE MOUTH RINSE: 15.0000 mL | OROMUCOSAL | Status: DC | PRN

## 2024-05-17 NOTE — Assessment & Plan Note (Addendum)
 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 Normal Na this morning 145.  - Will discontinue D5W to 125 mL/h  - Pending urine sodium and urine osmolality has been collected w/ pending result - BMP every 12 Hr. Starting at 1600 today - encouraged nursing to give fluids every 4 hours - Strict I's and O's, p.o.  - PT/OT

## 2024-05-17 NOTE — Plan of Care (Signed)

## 2024-05-17 NOTE — Plan of Care (Signed)
" °  Problem: Education: Goal: Knowledge of General Education information will improve Description: Including pain rating scale, medication(s)/side effects and non-pharmacologic comfort measures Outcome: Not Progressing   Problem: Health Behavior/Discharge Planning: Goal: Ability to manage health-related needs will improve Outcome: Not Progressing   Problem: Clinical Measurements: Goal: Ability to maintain clinical measurements within normal limits will improve Outcome: Progressing Goal: Will remain free from infection Outcome: Progressing Goal: Diagnostic test results will improve Outcome: Progressing Goal: Respiratory complications will improve Outcome: Progressing Goal: Cardiovascular complication will be avoided Outcome: Progressing   Problem: Activity: Goal: Risk for activity intolerance will decrease Outcome: Progressing   Problem: Nutrition: Goal: Adequate nutrition will be maintained Outcome: Progressing   Problem: Coping: Goal: Level of anxiety will decrease Outcome: Progressing   Problem: Elimination: Goal: Will not experience complications related to bowel motility Outcome: Progressing Goal: Will not experience complications related to urinary retention Outcome: Progressing   Problem: Pain Managment: Goal: General experience of comfort will improve and/or be controlled Outcome: Progressing   Problem: Safety: Goal: Ability to remain free from injury will improve Outcome: Progressing   Problem: Fluid Volume: Goal: Ability to maintain a balanced intake and output will improve Outcome: Progressing   Problem: Metabolic: Goal: Ability to maintain appropriate glucose levels will improve Outcome: Progressing   Problem: Nutritional: Goal: Maintenance of adequate nutrition will improve Outcome: Progressing Goal: Progress toward achieving an optimal weight will improve Outcome: Progressing   Problem: Skin Integrity: Goal: Risk for impaired skin integrity  will decrease Outcome: Progressing   Problem: Tissue Perfusion: Goal: Adequacy of tissue perfusion will improve Outcome: Progressing   "

## 2024-05-17 NOTE — Assessment & Plan Note (Addendum)
"-   Continue olanzapine  15 mg nightly, Quetiapine  400 mg nightly Cymbalta  30 mg nightly, lorazepam  1 mg BID, Depakote  250 mg BID, citalopram  40 mg daily, Cogentin  1 mg daily - continue to monitor LFT  - Daily Hepatic Panel "

## 2024-05-17 NOTE — Progress Notes (Addendum)
 "    Daily Progress Note Intern Pager: [phone]  Patient name: [name] Medical record number: [id number] Date of birth: May 18, 1975 Age: 49 y.o. 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 She has been admitted for AMS and hypernatremia. 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 Normal Na this morning 145.  - Will discontinue D5W to 125 mL/h  - Pending urine sodium and urine osmolality has been collected w/ pending result - BMP every 12 Hr. Starting at 1600 today - encouraged nursing to give fluids every 4 hours - Strict I's and O's, p.o.  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 - Continue Tamiflu  30 mg daily for 5 days, renally dosed Wrist  fracture, closed, left, initial encounter S/p fall on 05/15/24 w/ left hand swollen. Left wrist radiograph show  a suspected transverse oblique intraarticular nondisplaced fracture of the mid to lateral aspect of the distal radial metaphysis. - Orthopedic surgery consulted - Pending CT w/o contrast for left wrist  Chronic health problem GERD: Home Pepcid  20 mg twice daily, will switch to 40 mg daily HLD: Holding atorvastatin  20 mg due to elevated LFTs CKD stage IV : Holding  losartan  12.5 mg daily, monitor creatinine   FEN/GI: Heart health/carb modified  PPx: SQH Dispo:  Pending PT recommendations from clinical improvement   Subjective:  Doing well this morning. Mental status significantly improve. Eat breakfast in bed and seems to be back at baseline  Objective: Temp:  [97.6 F (36.4 C)-98.7 F (37.1 C)] 98.4 F (36.9 C) (01/06 0805) Pulse Rate:  [88-103] 88 (01/06 0800) Resp:  [16-17] 17 (01/06 0805) BP: (97-115)/(68-85) 99/77 (01/06 0805) SpO2:  [98 %-100 %] 100 % (01/06 0805)  Physical Exam Cardiovascular:     Rate and Rhythm: Normal rate.  Pulmonary:     Effort: Pulmonary effort is normal.  Abdominal:     Palpations: Abdomen is soft.  Skin:    Capillary Refill: Capillary refill takes less than 2 seconds.  Neurological:     Mental Status: She is alert. Mental status is at baseline.  Psychiatric:        Mood and Affect: Mood normal.        Behavior: Behavior normal.      Laboratory: Most recent CBC Lab Results  Component Value Date   WBC 6.9 05/16/2024  HGB 12.0 05/16/2024   HCT 37.0 05/16/2024   MCV 91.1 05/16/2024   PLT 169 05/16/2024   Most recent BMP    Latest Ref Rng & Units 05/17/2024    5:45 AM  BMP  Glucose 70 - 99 mg/dL 853   BUN 6 - 20 mg/dL 43   Creatinine 9.55 - 1.00 mg/dL 8.03   Sodium 864 - 854 mmol/L 145   Potassium 3.5 - 5.1 mmol/L 4.7   Chloride 98 - 111 mmol/L 111   CO2 22 - 32 mmol/L 25   Calcium  8.9 - 10.3 mg/dL 8.6      Imaging/Diagnostic Tests: EXAM:  AP AND LATERAL (2) VIEW(S) XRAY OF THE LEFT WRIST AND AP AND LATERAL (2)  VIEW(S) XRAY OF THE LEFT HAND  05/16/2024 12:23:00 PM   IMPRESSION: 1. Suspected transverse oblique intra-articular nondisplaced fracture of the mid to lateral aspect of the distal radial metaphysis on the AP view only ; no carpal fracture identified; a dedicated 4-view wrist series is recommended for confirmation. 2. Mild to moderate circumferential soft tissue swelling in the left wrist. 3. Apparent blistering along the lateral aspect of the left hand at the level of the proximal first metacarpal shaft with moderate to severe dorsal metacarpal swelling; no left hand fracture suspected.   Electronically signed by: Francis Quam MD 05/16/2024 08:58 PM EST RP Workstation: [id number]    Suzen Houston NOVAK, DO 05/17/2024, 9:47 AM  PGY-1, [facility] Family Medicine FPTS Intern pager: [phone], text pages welcome Secure chat group [facility] Teaching Service   "

## 2024-05-17 NOTE — Assessment & Plan Note (Addendum)
"  S/p fall on 05/15/24 w/ left hand swollen. Left wrist radiograph show  a suspected transverse oblique intraarticular nondisplaced fracture of the mid to lateral aspect of the distal radial metaphysis. - Orthopedic surgery consulted - Pending CT w/o contrast for left wrist "

## 2024-05-17 NOTE — Assessment & Plan Note (Addendum)
"  GERD: Home Pepcid  20 mg twice daily, will switch to 40 mg daily HLD: Holding atorvastatin  20 mg due to elevated LFTs CKD stage IV : Holding  losartan  12.5 mg daily, monitor creatinine "

## 2024-05-17 NOTE — Plan of Care (Addendum)
 Spoke with Mr. Jacquetta at [phone] to update patient status. She would like to be updated on CT left wrist result and Ortho recommendations. She also voice c/o pt fall. Otherwise all questions were answered. Pt w/ significantly improved mental status after her Na back to normal  Addendum: updated caretaker Ludwig) with the results of CT. No fracture or dislocation but there was soft tissue swelling. Said her Na was normal now but we are testing to see if she can maintain normal sodium off fluids.

## 2024-05-17 NOTE — Assessment & Plan Note (Addendum)
 CBGs remain at goal - Sensitive SSI - Continue Hold Jardiance  10 mg daily to avoid diuresis

## 2024-05-17 NOTE — Assessment & Plan Note (Addendum)
"  Significantly improve this morning - Continue hypernatremia treatment  - Continue Delirium precautions "

## 2024-05-17 NOTE — Assessment & Plan Note (Addendum)
 Positive on admission, stable on room air and afebrile viral persisting cough with altered mental status. - Continue Tamiflu  30 mg daily for 5 days, renally dosed

## 2024-05-17 NOTE — Assessment & Plan Note (Addendum)
"  Pt w/ no BM in the last few days - Starting MiraLAX  17 mg Daily  - Starting Senna 1 tablet Daily "

## 2024-05-18 ENCOUNTER — Other Ambulatory Visit (HOSPITAL_COMMUNITY): Payer: Self-pay

## 2024-05-18 DIAGNOSIS — E232 Diabetes insipidus: Secondary | ICD-10-CM | POA: Diagnosis not present

## 2024-05-18 LAB — BASIC METABOLIC PANEL WITH GFR
Anion gap: 9 (ref 5–15)
BUN: 49 mg/dL — ABNORMAL HIGH (ref 6–20)
CO2: 23 mmol/L (ref 22–32)
Calcium: 8.8 mg/dL — ABNORMAL LOW (ref 8.9–10.3)
Chloride: 107 mmol/L (ref 98–111)
Creatinine, Ser: 1.95 mg/dL — ABNORMAL HIGH (ref 0.44–1.00)
GFR, Estimated: 31 mL/min — ABNORMAL LOW
Glucose, Bld: 133 mg/dL — ABNORMAL HIGH (ref 70–99)
Potassium: 5.1 mmol/L (ref 3.5–5.1)
Sodium: 139 mmol/L (ref 135–145)

## 2024-05-18 LAB — HEPATIC FUNCTION PANEL
ALT: 59 U/L — ABNORMAL HIGH (ref 0–44)
AST: 57 U/L — ABNORMAL HIGH (ref 15–41)
Albumin: 3.1 g/dL — ABNORMAL LOW (ref 3.5–5.0)
Alkaline Phosphatase: 56 U/L (ref 38–126)
Bilirubin, Direct: <0.1 mg/dL (ref 0.0–0.2)
Total Bilirubin: 0.3 mg/dL (ref 0.0–1.2)
Total Protein: 6.2 g/dL — ABNORMAL LOW (ref 6.5–8.1)

## 2024-05-18 LAB — CBC
HCT: 31.7 % — ABNORMAL LOW (ref 36.0–46.0)
Hemoglobin: 10.3 g/dL — ABNORMAL LOW (ref 12.0–15.0)
MCH: 29.4 pg (ref 26.0–34.0)
MCHC: 32.5 g/dL (ref 30.0–36.0)
MCV: 90.6 fL (ref 80.0–100.0)
Platelets: 220 K/uL (ref 150–400)
RBC: 3.5 MIL/uL — ABNORMAL LOW (ref 3.87–5.11)
RDW: 13.7 % (ref 11.5–15.5)
WBC: 7.9 K/uL (ref 4.0–10.5)
nRBC: 0.4 % — ABNORMAL HIGH (ref 0.0–0.2)

## 2024-05-18 LAB — GLUCOSE, CAPILLARY
Glucose-Capillary: 165 mg/dL — ABNORMAL HIGH (ref 70–99)
Glucose-Capillary: 148 mg/dL — ABNORMAL HIGH (ref 70–99)

## 2024-05-18 MED ORDER — SENNOSIDES-DOCUSATE SODIUM 8.6-50 MG PO TABS
1.0000 | ORAL_TABLET | Freq: Every day | ORAL | 0 refills | Status: DC
  Filled 2024-05-18: qty 30, 30d supply, fill #0

## 2024-05-18 MED ORDER — POLYETHYLENE GLYCOL 3350 17 GM/SCOOP PO POWD
17.0000 g | Freq: Every day | ORAL | 0 refills | Status: DC
  Filled 2024-05-18: qty 238, 14d supply, fill #0

## 2024-05-18 NOTE — Progress Notes (Signed)
 Patient discharged, Important Message Letter mailed to patient.

## 2024-05-18 NOTE — Discharge Instructions (Addendum)
 Dear [name],   Thank you so much for allowing us  to be part of your care!  You were admitted to [facility] for altered mental status and kidney injury. This was most likely due to high sodium levels. You were treated with fluids.    POST-HOSPITAL & CARE INSTRUCTIONS Your liver function tests were abnormal. This could be because of one of the psychiatric medications you are on. Please follow up with your psychiatrist to discuss options.  We also are holding your cholesterol medications because your liver function tests were abnormal. Please address restarting this with your primary care doctor.  We are holding your losartan , jardiance , aspirin  until you see your primary care doctor.  Please let PCP/Specialists know of any changes that were made.  Please see medications section of this packet for any medication changes.   DOCTOR'S APPOINTMENT & FOLLOW UP CARE INSTRUCTIONS  Future Appointments  Date Time Provider Department Center  05/23/2024 11:30 AM Donah Laymon PARAS, MD [facility]  06/16/2024  8:30 AM Donah Laymon PARAS, MD [facility]  10/26/2024  7:00 AM [facility] 2 [facility]    RETURN PRECAUTIONS: Please return to the emergency room if you start to feel very fatigued or confused.   Take care and be well!  Family Medicine Teaching Service  [place]  [facility]  [address] [place], [place] [address] [phone]

## 2024-05-18 NOTE — Assessment & Plan Note (Deleted)
 CBGs remain at goal - Sensitive SSI - Continue Hold Jardiance  10 mg daily to avoid diuresis

## 2024-05-18 NOTE — Assessment & Plan Note (Addendum)
 Patient had fallen 05/15/2024.  CT scan did not show concern for fracture.  PT following  - PT recs - Tylenol  650 every 6 hours as needed for pain

## 2024-05-18 NOTE — Assessment & Plan Note (Addendum)
 GERD: Continue Pepcid  20 mg daily DMII/diabetes insipidus: Continue sensitive sliding scale insulin , continue holding Jardiance  to avoid diuresis HLD: Holding atorvastatin  due to elevated LFTs CKD stage IV : Continue to hold home losartan  12.5 mg daily

## 2024-05-18 NOTE — Assessment & Plan Note (Addendum)
 Patient appears to be at her baseline, stable for discharge. -Continue delirium precautions -Continue to treat hyponatremia as below

## 2024-05-18 NOTE — Progress Notes (Signed)
 "    Daily Progress Note Intern Pager: [phone]  Patient name: [name] Medical record number: [id number] Date of birth: May 24, 1975 Age: 49 y.o. Gender: female  Primary Care Provider: Donah Laymon PARAS, MD Consultants: Ortho Code Status: Full  Pt Overview and Major Events to Date:  1/2: admitted 1/4: fall directly on L hip   Medical Decision Making: Patient is a 50 year old female admitted for AMS and hyponatremia.  AMS is now significantly improved, believe patient is back to baseline.  Hypernatremia continues to improve.  Pertinent past medical history includes intellectual disability, schizoaffective bipolar type, diabetes insipidus, type 2 diabetes, GERD, HLD  Assessment & Plan Altered mental status Intellectual disability Patient appears to be at her baseline, stable for discharge. 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 Na 139 this AM  -Continue restricted 2 g NA diet -Urine sodium and urine osmolalities pending -Encourage p.o. -Strict I/os -AM BMP if still admitted Wrist fracture, closed, left, initial encounter Patient had fallen 05/15/2024.  CT scan did not show concern for fracture.  PT following  - PT recs - Tylenol  650 every 6 hours as needed for pain Schizophrenia (HCC) Schizoaffective disorder, bipolar type (HCC) - Continue current regimen: -Olanzapine  15 mg nightly -Citalopram  40 mg daily -Depakote  250 mg twice daily -Cymbalta  30 mg daily at bedtime -Ativan  1 mg twice daily -Seroquel  400 mg at bedtime -Benztropine  1 mg daily - Patient will need outpatient follow-up with psychiatry for concern of LFT elevation on current regimen Influenza A virus present (Resolved: 05/18/2024) Positive on admission, stable on room air and afebrile viral persisting cough with  altered mental status. - Continue Tamiflu  30 mg daily for 5 days, renally dosed Chronic health problem GERD: Continue Pepcid  20 mg daily DMII/diabetes insipidus: Continue sensitive sliding scale insulin , continue holding Jardiance  to avoid diuresis HLD: Holding atorvastatin  due to elevated LFTs CKD stage IV : Continue to hold home losartan  12.5 mg daily  FEN/GI: 2 g sodium restricted diet PPx: Subcu heparin  Dispo: Clinically stable for discharge  Subjective:  Patient is wondering where her breakfast is, no acute distress  Objective: Temp:  [98.2 F (36.8 C)-98.9 F (37.2 C)] 98.7 F (37.1 C) (01/07 0443) Pulse Rate:  [78-99] 86 (01/07 0758) Resp:  [16-17] 16 (01/07 0758) BP: (91-104)/(58-72) 104/72 (01/07 0758) SpO2:  [99 %-100 %] 99 % (01/07 0443) Physical Exam: General: A&O, NAD HEENT: No sign of trauma, EOM grossly intact Respiratory: normal WOB GI: non-distended  Extremities: no peripheral edema. Neuro: Normal gait, moves all four extremities appropriately Skin: no lesions/rashes visualized  Laboratory: Most recent CBC Lab Results  Component Value Date   WBC 7.9 05/18/2024   HGB 10.3 (L) 05/18/2024   HCT 31.7 (L) 05/18/2024   MCV 90.6 05/18/2024   PLT 220 05/18/2024   Most recent BMP    Latest Ref Rng & Units 05/18/2024    4:43 AM  BMP  Glucose 70 - 99 mg/dL 866   BUN 6 - 20 mg/dL 49   Creatinine 9.55 - 1.00 mg/dL 8.04   Sodium 864 - 854 mmol/L 139   Potassium 3.5 - 5.1 mmol/L 5.1   Chloride 98 - 111 mmol/L 107   CO2 22 - 32 mmol/L 23   Calcium  8.9 - 10.3 mg/dL 8.8      Imaging/Diagnostic Tests: CT L Wrist:  IMPRESSION: 1. No  acute fracture or dislocation of the left wrist. 2. Soft tissue swelling of the dorsal wrist and hand. Mild cutaneous thickening overlying the proximal first metacarpal. No loculated fluid collection. No radiopaque foreign body. Lonnie Frederico Gerling, MD 05/18/2024, 9:04 AM  PGY-2, [facility] FPTS Intern pager:  [phone], text pages welcome Secure chat group [facility] Teaching Service   "

## 2024-05-18 NOTE — Assessment & Plan Note (Deleted)
 Pt w/ no BM in the last few days - Starting MiraLAX  17 mg Daily  - Starting Senna 1 tablet Daily

## 2024-05-18 NOTE — TOC Transition Note (Signed)
 Transition of Care [facility]) - Discharge Note   Patient Details  Name: [name] MRN: [id number] Date of Birth: 05-10-1976  Transition of Care [facility]) CM/SW Contact:  Rosalva Jon Bloch, RN Phone Number: 05/18/2024, 2:29 PM   Clinical Narrative:    Patient will DC to: group home Anticipated DC date: 05/18/2024 Family notified: yes Transport by: car  Per MD patient ready for DC today. RN, patient, patient's guardian Lauraine Becker, and group home director Rosella Bunker   notified of DC. Guardian and group home director agreeable to home health services , no provider preference. Referral made with [facility] and accepted. Pt without RX med concerns or transportation issues. Group home to provide transportation to home by [employer]. Post hospital f/u noted on AVS.  Lauraine Becker Legal Guardian Emergency Contact [phone] Rosella Bunker Other [phone]    RNCM will sign off for now as intervention is no longer needed. Please consult us  again if new needs arise.    Final next level of care: Home w Home Health Services Barriers to Discharge: No Barriers Identified   Patient Goals and CMS Choice     Choice offered to / list presented to : [facility] POA / Guardian      Discharge Placement                       Discharge Plan and Services Additional resources added to the After Visit Summary for       Post Acute Care Choice: Home Health                    HH Arranged: PT, OT [facility] Agency: [facility] Date [facility] Agency Contacted: 05/18/24 Time HH Agency Contacted: 1429 Representative spoke with at [facility] Agency: [facility]  Social Drivers of Health (SDOH) Interventions SDOH Screenings   Food Insecurity: Patient Unable To Answer (05/14/2024)  Housing: Unknown (05/15/2024)  Transportation Needs: Patient Unable To Answer (05/14/2024)  Utilities: Patient Unable To Answer (05/14/2024)  Alcohol Screen: Low Risk (03/19/2023)  Depression (PHQ2-9): Medium  Risk (05/10/2024)  Financial Resource Strain: Low Risk (03/19/2023)  Physical Activity: Patient Unable To Answer (03/19/2023)  Social Connections: Moderately Isolated (03/19/2023)  Stress: No Stress Concern Present (03/19/2023)  Tobacco Use: Low Risk (05/13/2024)  Health Literacy: Patient Unable To Answer (03/19/2023)     Readmission Risk Interventions    10/30/2021   12:52 PM  Readmission Risk Prevention Plan  Transportation Screening Complete  PCP or Specialist Appt within 5-7 Days Complete  Home Care Screening Complete  Medication Review (RN CM) Complete
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
## 2024-05-18 NOTE — Progress Notes (Signed)
 Patient discharged to group home. Discharge instructions called and given to [name] Richardson at group home. Receiver verbalized no concerns. Patient left unit in wheelchair pushed by Angelito,Charge RN. Left in stable condition. Spoke with [name] about discharge meds from [facility] pharmacy. [name] reported that the Miralax  and senna are drugs that they have at the group home and that patient did not need to get them from here.

## 2024-05-18 NOTE — Assessment & Plan Note (Addendum)
-   Continue current regimen: -Olanzapine  15 mg nightly -Citalopram  40 mg daily -Depakote  250 mg twice daily -Cymbalta  30 mg daily at bedtime -Ativan  1 mg twice daily -Seroquel  400 mg at bedtime -Benztropine  1 mg daily - Patient will need outpatient follow-up with psychiatry for concern of LFT elevation on current regimen

## 2024-05-18 NOTE — Assessment & Plan Note (Addendum)
 Positive on admission, stable on room air and afebrile viral persisting cough with altered mental status. - Continue Tamiflu  30 mg daily for 5 days, renally dosed

## 2024-05-18 NOTE — Care Management Important Message (Signed)
 Important Message  Patient Details  Name: [name] MRN: [id number] Date of Birth: 10/12/1975   Important Message Given:  No     [name] 05/18/2024, 3:52 PM

## 2024-05-18 NOTE — Assessment & Plan Note (Addendum)
 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 Na 139 this AM  -Continue restricted 2 g NA diet -Urine sodium and urine osmolalities pending -Encourage p.o. -Strict I/os -AM BMP if still admitted

## 2024-05-18 NOTE — Progress Notes (Signed)
 Physical Therapy Treatment Patient Details Name: [name] MRN: [id number] DOB: March 12, 1976 Today's Date: 05/18/2024   History of Present Illness The pt is a 49 yo female presenting 1/2 with decreased po intake for 4 days, AMS. Found to have hypernatremia due to dehydration and influenza A. . PMH includes: intellectual disability, SVT, DM II, CKD III, HLD, and schizophrenia.    PT Comments  Continuing work on functional mobility and activity tolerance;  Session focused on gait and functional transfers, in the setting of using the bathroom as pt expressed a need; Smoother steps today with rW, and pt herself requested the RW when given the choice to walk with RW or without it; Cues for washing hands (and using soap) after voiding; Ended session in bed due to high fall risk, pt getting bak to eating her breakfast    If plan is discharge home, recommend the following: A lot of help with walking and/or transfers;A little help with bathing/dressing/bathroom;Assistance with cooking/housework;Direct supervision/assist for medications management;Direct supervision/assist for financial management;Assist for transportation;Supervision due to cognitive status   Can travel by private vehicle        Equipment Recommendations  None recommended by PT    Recommendations for Other Services       Precautions / Restrictions Precautions Precautions: Fall Recall of Precautions/Restrictions: Impaired Restrictions Weight Bearing Restrictions Per Provider Order: No     Mobility  Bed Mobility Overal bed mobility: Needs Assistance Bed Mobility: Supine to Sit     Supine to sit: Contact guard, HOB elevated     General bed mobility comments: CGA for safety    Transfers Overall transfer level: Needs assistance Equipment used: Rolling walker (2 wheels) Transfers: Sit to/from Stand Sit to Stand: Contact guard assist           General transfer comment: Stood from EOB and from toilet; slow rise,  and with heavy dependence on UEs to push up    Ambulation/Gait Ambulation/Gait assistance: Min assist Gait Distance (Feet): 15 Feet (x2) Assistive device: Rolling walker (2 wheels), None Gait Pattern/deviations: Decreased stride length Gait velocity: decreased     General Gait Details: Pt chose to walk with RW; still with decr stride length, but seems like her steps wer longer and more efficient than previous PT session   Stairs             Wheelchair Mobility     Tilt Bed    Modified Rankin (Stroke Patients Only)       Balance     Sitting balance-Leahy Scale: Fair       Standing balance-Leahy Scale: Poor (Approaching Fair) Standing balance comment: Dependent on external support to maintain standing balance                            Communication Communication Communication: Impaired Factors Affecting Communication: Reduced clarity of speech;Difficulty expressing self  Cognition Arousal: Alert Behavior During Therapy: Flat affect   PT - Cognitive impairments: No family/caregiver present to determine baseline, History of cognitive impairments, Awareness, Initiation, Sequencing, Safety/Judgement                       PT - Cognition Comments: no caregiver present in session, pt with limited command following at times in session, able to answer simple questions. Following commands: Impaired Following commands impaired: Follows one step commands with increased time    Cueing Cueing Techniques: Verbal cues, Visual cues, Tactile cues, Gestural cues  Exercises      General Comments General comments (skin integrity, edema, etc.): Discussed fall risk with Nsg, and opted to finish session in bed to finish eating breakfast      Pertinent Vitals/Pain Pain Assessment Pain Assessment: Faces Faces Pain Scale: No hurt Pain Intervention(s): Monitored during session    Home Living                          Prior Function             PT Goals (current goals can now be found in the care plan section) Acute Rehab PT Goals Patient Stated Goal: to return home PT Goal Formulation: With patient Time For Goal Achievement: 05/28/24 Potential to Achieve Goals: Good Progress towards PT goals: Progressing toward goals    Frequency    Min 2X/week      PT Plan      Co-evaluation              AM-PAC PT 6 Clicks Mobility   Outcome Measure  Help needed turning from your back to your side while in a flat bed without using bedrails?: A Little Help needed moving from lying on your back to sitting on the side of a flat bed without using bedrails?: A Little Help needed moving to and from a bed to a chair (including a wheelchair)?: A Little Help needed standing up from a chair using your arms (e.g., wheelchair or bedside chair)?: A Little Help needed to walk in hospital room?: A Little Help needed climbing 3-5 steps with a railing? : A Little 6 Click Score: 18    End of Session Equipment Utilized During Treatment: Gait belt Activity Tolerance: Patient tolerated treatment well Patient left: with call bell/phone within reach;in bed;with bed alarm set Nurse Communication: Mobility status PT Visit Diagnosis: Unsteadiness on feet (R26.81);Muscle weakness (generalized) (M62.81)     Time: 8961-8942 PT Time Calculation (min) (ACUTE ONLY): 19 min  Charges:    $Gait Training: 8-22 mins PT General Charges $$ ACUTE PT VISIT: 1 Visit                     Silvano Currier, PT  Acute Rehabilitation Services Office [phone] Secure Chat welcomed    Silvano VEAR Currier 05/18/2024, 11:21 AM

## 2024-05-18 NOTE — Discharge Summary (Addendum)
 "  Family Medicine Teaching [facility] Discharge Summary  Patient name: [name] Medical record number: [id number] Date of birth: 1976/03/31 Age: 49 y.o. Gender: female Date of Admission: 05/13/2024  Date of Discharge: 05/18/2024  Admitting Physician: Jacqueline LOISE Free, DO  Primary Care Provider: Donah Laymon PARAS, Jacqueline Consultants: Ortho  Indication for Hospitalization: AMS  Discharge Diagnoses/Problem List:  Principal Problem for Admission: Hypernatremia  Other Problems addressed during stay:  Principal Problem:   Altered mental status Hypernatremia Active Problems:   Diabetes insipidus   Schizophrenia (HCC)   Type 2 diabetes mellitus with renal manifestations (HCC)   Intellectual disability   Diabetic neuropathy, type II diabetes mellitus (HCC)   Hyperlipidemia associated with type 2 diabetes mellitus (HCC) Wrist swelling, without fracture from fall    Brief Hospital Course:  [name] is a 49 y.o.female with a history of schizoaffective disorder, no actual disability, diabetes insipidus, T2DM who was admitted to the family medicine teaching Service at [facility] for altered mental status and hypernatremia. Her hospital course is detailed below:  Hypernatremia  altered mental status  influenza A Initially presented with decreased p.o. intake and excessive cough ongoing for the past several days.  CT head and CXR unremarkable.  Initial lab work found sodium 157, patient was started on hyponatremia protocol and started on D5/water, sodium gradually down trended w/inc IVF. Remained slightly altered worse than normal baseline and follow-up brain MRI on 05/17/23 found no significant change. Patient gradually returned back to baseline with IV and oral fluids and treatment of influenza for which she received symptomatic treatment and renally dosed Tamiflu . Losartan  and jardiance  were held on discharge to be restarted by PCP.   Acute on chronic kidney injury Suspected to be primarily  prerenal due to decreased p.o. intake, creatinine gradually improved with IV fluids and returned back to baseline.   Refeeding Syndrome  Hypophosphatemia  Patient had decrease phosphorus which was repleted during admission. Was eating less recently due to viral illness. Dietician was consulted as well.   Left Wrist Pain Pt s/p fall on 05/15/24 w/ left hand swollen. Left wrist radiograph show  a suspected transverse oblique intraarticular nondisplaced fracture of the mid to lateral aspect of the distal radial metaphysis. Orthopedics was consulted. They recommended CT without contrast. CT was obtained for confirmation which did not show acute fracture or dislocation. PT wrapped her wrist for comfort. Orthopedics recommended no further treatment.   Other chronic conditions were medically managed with home medications and formulary alternatives as necessary (schizoaffective disorder, intellectual disability, diabetes insipidus, T2DM)  PCP Follow-up Recommendations: F/u w/ repeat BMP  s/p Hypernatremia  Monitoring LFT in one week.  Follow up with psychiatry to discuss antipsychotic regimen given elevated ALT.  Holding statin on discharge given her LFTs. Consider restarting when appropriate.  Holding losartan  and jardiance  given hypernatremia and slight kidney injury when admitted. Restart as able.  Holding aspirin , unclear for primary or secondary prevention. Consider restarting if indicated.     Results/Tests Pending at Time of Discharge:  Unresulted Labs (From admission, onward)     Start     Ordered   05/19/24 0500  Basic metabolic panel  Tomorrow morning,   R       Question:  Specimen collection method  Answer:  Lab=Lab collect   05/18/24 1241   05/14/24 1309  Osmolality, urine  Once,   R        05/14/24 1309   05/14/24 1309  Sodium, urine, random  Once,  R        05/14/24 1309             Disposition: Group Home  Discharge Condition: Stable   Discharge Exam:  Vitals:    05/18/24 0443 05/18/24 0758  BP: (!) 97/58 104/72  Pulse: 99 86  Resp: 16 16  Temp: 98.7 F (37.1 C) 98.9 F (37.2 C)  SpO2: 99%    General: A&O, NAD HEENT: No sign of trauma, EOM grossly intact Respiratory: normal WOB GI: non-distended  Extremities: no peripheral edema. Neuro: Normal gait, moves all four extremities appropriately Skin: no lesions/rashes visualized  Significant Procedures: none  Significant Labs and Imaging:  Recent Labs  Lab 05/18/24 0443  WBC 7.9  HGB 10.3*  HCT 31.7*  PLT 220   Recent Labs  Lab 05/16/24 2037 05/17/24 0545 05/17/24 1058 05/17/24 1601 05/18/24 0443  NA 148* 145 139 137 139  K 4.8 4.7 4.9 5.0 5.1  CL 113* 111 107 105 107  CO2 25 25 23 23 23   GLUCOSE 114* 146* 112* 129* 133*  BUN 41* 43* 44* 46* 49*  CREATININE 2.13* 1.96* 1.94* 1.97* 1.95*  CALCIUM  8.8* 8.6* 8.8* 8.7* 8.8*  ALKPHOS  --   --   --   --  56  AST  --   --   --   --  57*  ALT  --   --   --   --  59*  ALBUMIN  --   --   --   --  3.1*     Pertinent Imaging    CT L Wrist: 1/6 IMPRESSION: 1. No acute fracture or dislocation of the left wrist. 2. Soft tissue swelling of the dorsal wrist and hand. Mild cutaneous thickening overlying the proximal first metacarpal. No loculated fluid collection. No radiopaque foreign body.  MRI head 1/5: Moderate cerebral atrophy similar to March 20, 2021   Left Hip xray 1/4: 1. No significant abnormality in the left hip or visualized pelvis.   Discharge Medications:  Allergies as of 05/18/2024       Reactions   Minocycline    Unknown         Medication List     PAUSE taking these medications    aspirin  81 MG chewable tablet Wait to take this until your doctor or other care provider tells you to start again. Commonly known as: GNP Adult Aspirin  Low Strength CHEW 1 TABLET DAILY   atorvastatin  20 MG tablet Wait to take this until your doctor or other care provider tells you to start again. Commonly known as:  LIPITOR Take 1 tablet (20 mg total) by mouth at bedtime.   empagliflozin  10 MG Tabs tablet Wait to take this until your doctor or other care provider tells you to start again. Commonly known as: Jardiance  Take 1 tablet (10 mg total) by mouth daily.   losartan  25 MG tablet Wait to take this until your doctor or other care provider tells you to start again. Commonly known as: COZAAR  Take 0.5 tablets (12.5 mg total) by mouth daily.       TAKE these medications    benztropine  1 MG tablet Commonly known as: COGENTIN  Take 1 mg by mouth daily. The timing of this medication is very important.   Centrum Adults Tabs Take 1 tablet by mouth daily.   citalopram  40 MG tablet Commonly known as: CELEXA  Take 40 mg by mouth daily.   divalproex  250 MG Jacqueline tablet Commonly known as: DEPAKOTE   Take 250 mg by mouth 2 (two) times daily. \   DULoxetine  30 MG capsule Commonly known as: CYMBALTA  Take 30 mg by mouth at bedtime.   famotidine  20 MG tablet Commonly known as: PEPCID  Take 1 tablet (20 mg total) by mouth 2 (two) times daily.   Invega Sustenna 234 MG/1.5ML injection Generic drug: paliperidone Inject 234 mg into the muscle every 21 ( twenty-one) days.   Invega Sustenna 156 MG/ML Susy injection Generic drug: paliperidone Inject 156 mg into the muscle once.   LORazepam  1 MG tablet Commonly known as: ATIVAN  Take 1 mg by mouth daily. 1 tab at 2 pm, and 1 tab at bedtime   medroxyPROGESTERone  150 MG/ML injection Commonly known as: DEPO-PROVERA  Inject 150 mg into the muscle every 3 (three) months.   OLANZapine  15 MG tablet Commonly known as: ZYPREXA  Take 15 mg by mouth at bedtime.   OneTouch Delica Plus Lancet30G Misc check blood sugar ONCE DAILY   OneTouch Verio test strip Generic drug: glucose blood Use as instructed- patient to test her glucose once daily   polyethylene glycol powder 17 GM/SCOOP powder Commonly known as: GLYCOLAX /MIRALAX  Take 17 g by mouth daily.  Dissolve 1 capful (17g) in 4-8 ounces of liquid and take by mouth daily.   QUEtiapine  400 MG tablet Commonly known as: SEROQUEL  Take 400 mg by mouth at bedtime.   senna-docusate 8.6-50 MG tablet Commonly known as: Senokot-S Take 1 tablet by mouth at bedtime.   valbenazine  40 MG capsule Commonly known as: INGREZZA  Take 40 mg by mouth at bedtime.        Discharge Instructions: Please refer to Patient Instructions section of EMR for full details.  Patient was counseled important signs and symptoms that should prompt return to medical care, changes in medications, dietary instructions, activity restrictions, and follow up appointments.   Follow-Up Appointments:  Follow-up Information     Jacqueline Laymon PARAS, Jacqueline Follow up.   Specialty: Family Medicine Contact information: [address] [place] [address] [phone]                 Lonnie Earnest, Jacqueline 05/18/2024, 12:57 PM PGY-2,  Family Medicine  "

## 2024-05-19 ENCOUNTER — Telehealth: Payer: Self-pay

## 2024-05-19 ENCOUNTER — Other Ambulatory Visit: Payer: Self-pay | Admitting: Family Medicine

## 2024-05-19 NOTE — Transitions of Care (Post Inpatient/ED Visit) (Addendum)
 Today's TOC FU Call Status: Today's TOC FU Call Status:: Successful TOC FU Call Completed TOC FU Call Complete Date: 05/19/24  Patient's Name and Date of Birth confirmed. Name, DOB (Spoke with [name] at [phone] at [facility])  Transition Care Management Follow-up Telephone Call Date of Discharge: 05/18/24 Discharge Facility: [name] [facility]) Type of Discharge: Inpatient Admission Primary Inpatient Discharge Diagnosis:: Altered Mental Status How have you been since you were released from the hospital?: Better Any questions or concerns?: No  Items Reviewed: Did you receive and understand the discharge instructions provided?: Yes Medications obtained,verified, and reconciled?: No  Medications Reviewed Today: Per [name], patient had all medications at group home except for Miralax  and Senna, which she stated do not work for the patient and will not be given and she should have been consulted. Rest of medications were in place and available at the Group Home.  Medications Reviewed Today   Medications were not reviewed in this encounter    Assessment deferred by [name] Richardson/Informant on this call.   05/19/2024: Contact was made with [name] of the patient's group home disposition.  RN CM identified self and reason for call to Ms Prince relating to patient.   [name] confirmed [facility] is scheduled to come out Saturday 05/21/24 to see patient and has been in contact, discharge instructions were understood, and group home will transport patient to any follow up appointments needed.   [name] is listed as [employer] Guardian at [phone] and RN CM attempted to reach her and attempted to leave a voice message but voice mail box was full and unable to leave a message.   Ms Prince did not feel the need for follow up calls but appreciated the post discharge check in.  PCP HFU is scheduled for 05/23/2024 per EMR.    05/19/2024: 2 pm: NOTE ADDENDUM from call back via [name] requested contact PCP to have Miralax  and Senna discharge medication orders discontinued due to stating Miralax  makes patient worse and Senna makes patient incontinent of stool.  Needs medications officially discontinued for compliance issues at group home.  RN CM will send this note via EPIC communications and also try to reach out by telephone to relay message request.  Sent high priority communication via EPIC. Left secure chat message for PCP provider as well as:  Voice mail left at PCP office with all related information and contact numbers including TOC RN CM at 230 pm.   Ms Prince asked that the DC order be faxed to [phone].  Her direct phone number is [phone]   Bing Edison MSN, RN RN Case Manager [facility]  [facility] Office Hours M-F [phone] Direct Dial: [phone] Main Phone [phone]  Fax: [phone] [email or web address]

## 2024-05-19 NOTE — Progress Notes (Unsigned)
 Miralax  and senna orders discontinued per caregiver's request. Will fax letter stating these have been discontinued.  Laymon JINNY Legions, MD

## 2024-05-23 ENCOUNTER — Ambulatory Visit: Admitting: Family Medicine

## 2024-05-23 ENCOUNTER — Encounter: Payer: Self-pay | Admitting: Family Medicine

## 2024-05-23 VITALS — BP 120/82 | HR 93 | Ht 64.0 in | Wt 134.4 lb

## 2024-05-23 DIAGNOSIS — N183 Chronic kidney disease, stage 3 unspecified: Secondary | ICD-10-CM

## 2024-05-23 DIAGNOSIS — E1122 Type 2 diabetes mellitus with diabetic chronic kidney disease: Secondary | ICD-10-CM

## 2024-05-23 DIAGNOSIS — F209 Schizophrenia, unspecified: Secondary | ICD-10-CM | POA: Diagnosis not present

## 2024-05-23 DIAGNOSIS — E870 Hyperosmolality and hypernatremia: Secondary | ICD-10-CM | POA: Diagnosis present

## 2024-05-23 MED ORDER — ONETOUCH DELICA PLUS LANCET30G MISC: 3 refills | Status: AC

## 2024-05-23 MED ORDER — ONETOUCH VERIO VI STRP: ORAL_STRIP | 3 refills | Status: AC

## 2024-05-23 MED ORDER — ONETOUCH ULTRA 2 W/DEVICE KIT: PACK | 0 refills | Status: AC

## 2024-05-23 NOTE — Telephone Encounter (Signed)
 Received call from [place] in regards to below.   Once discharge orders have been written, will fax to [name].   Will forward to PCP.

## 2024-05-23 NOTE — Patient Instructions (Addendum)
 It was great to see you again today.  Stay off lipitor, jardiance , losartan  until we get labs back. I will be in touch then.  Sent in diabetes testing supplies  Ok to resume aspirin .  Follow up with me as scheduled  Be well, Dr. Donah

## 2024-05-23 NOTE — Telephone Encounter (Signed)
 I already wrote a letter last week & faxed it to the # requested, stating that those orders have been discontinued, there should not be anything else needed  Laymon JINNY Legions, MD

## 2024-05-23 NOTE — Progress Notes (Unsigned)
"  °  Date of Visit: 05/23/2024   SUBJECTIVE:   HPI:  Discussed the use of AI scribe software for clinical note transcription with the patient, who gave verbal consent to proceed.  History of Present Illness [name] is a 49 year old female who presents for a hospital follow-up after being hospitalized for the flu and high sodium levels.  Recent hospitalization for influenza and hypernatremia - Hospitalized for five days due to influenza and elevated sodium levels - Required multiple intravenous lines for dehydration management - No aspirin , atorvastatin , Jardiance , or other medications since discharge  Neuropsychiatric and behavioral disturbances - Exhibited behavioral disturbances during hospitalization and in the community, including stealing food from strangers, running into the street, and causing public disturbances - Aggressive behavior at home, including pulling out a staff member's braid and damaging property - Fixation on religious themes, including preoccupation with demons and seeking prayer from strangers - Frequently approaches people in public places to request prayer, ongoing concern for caregivers  Gastrointestinal symptoms - Caregiver requests discontinuation of Miralax  due to constipation  Diabetes management - Caregiver requests new prescription for glucometer as previous device is broken  Laboratory monitoring - Caregiver requests that any blood work performed today be shared with psych NP Camelia Mountain for ongoing sodium level monitoring   OBJECTIVE:   BP 120/82   Pulse 93   Ht 5' 4 (1.626 m)   Wt 134 lb 6.4 oz (61 kg)   SpO2 98%   BMI 23.07 kg/m  Gen: no acute distress, cooperative HEENT: normocephalic, atraumatic, moist mucous membranes  Heart: regular rate and rhythm  Lungs: clear to auscultation bilaterally, normal work of breathing  Neuro: grossly nonfocal, speech at baseline Ext: No appreciable lower extremity edema bilaterally    ASSESSMENT/PLAN:   Assessment & Plan Hypernatremia Recent hospitalization due to hypernatremia, likely exacerbated by excessive water intake and behavioral issues. - recheck labs today - Review lab results before making medication decisions. Type 2 diabetes mellitus with stage 3 chronic kidney disease, without long-term current use of insulin , unspecified whether stage 3a or 3b CKD (HCC) - Prescribed new glucometer and refills for lancets and strips. - Hold diabetes medications until lab results are reviewed. Schizophrenia, unspecified type (HCC) Continue follow up with psych NP   Laymon PARAS. Donah, MD [facility] "

## 2024-05-24 LAB — CMP14+EGFR
ALT: 25 IU/L (ref 0–32)
AST: 20 IU/L (ref 0–40)
Albumin: 3.8 g/dL — ABNORMAL LOW (ref 3.9–4.9)
Alkaline Phosphatase: 60 IU/L (ref 41–116)
BUN/Creatinine Ratio: 15 (ref 9–23)
BUN: 31 mg/dL — ABNORMAL HIGH (ref 6–24)
Bilirubin Total: 0.2 mg/dL (ref 0.0–1.2)
CO2: 22 mmol/L (ref 20–29)
Calcium: 9.3 mg/dL (ref 8.7–10.2)
Chloride: 109 mmol/L — ABNORMAL HIGH (ref 96–106)
Creatinine, Ser: 2.12 mg/dL — ABNORMAL HIGH (ref 0.57–1.00)
Globulin, Total: 3 g/dL (ref 1.5–4.5)
Glucose: 87 mg/dL (ref 70–99)
Potassium: 4.9 mmol/L (ref 3.5–5.2)
Sodium: 147 mmol/L — ABNORMAL HIGH (ref 134–144)
Total Protein: 6.8 g/dL (ref 6.0–8.5)
eGFR: 28 mL/min/1.73 — ABNORMAL LOW

## 2024-05-25 ENCOUNTER — Ambulatory Visit: Payer: Self-pay | Admitting: Family Medicine

## 2024-05-25 NOTE — Assessment & Plan Note (Signed)
Continue follow-up with psych NP

## 2024-05-25 NOTE — Assessment & Plan Note (Signed)
-   Prescribed new glucometer and refills for lancets and strips. - Hold diabetes medications until lab results are reviewed.

## 2024-05-25 NOTE — Assessment & Plan Note (Signed)
 Recent hospitalization due to hypernatremia, likely exacerbated by excessive water intake and behavioral issues. - recheck labs today - Review lab results before making medication decisions.

## 2024-06-16 ENCOUNTER — Ambulatory Visit: Admitting: Family Medicine

## 2024-06-16 ENCOUNTER — Encounter: Payer: Self-pay | Admitting: Family Medicine

## 2024-06-16 VITALS — BP 118/79 | HR 99 | Ht 64.0 in | Wt 132.2 lb

## 2024-06-16 DIAGNOSIS — Z1211 Encounter for screening for malignant neoplasm of colon: Secondary | ICD-10-CM

## 2024-06-16 DIAGNOSIS — Z309 Encounter for contraceptive management, unspecified: Secondary | ICD-10-CM

## 2024-06-16 DIAGNOSIS — E1122 Type 2 diabetes mellitus with diabetic chronic kidney disease: Secondary | ICD-10-CM

## 2024-06-16 DIAGNOSIS — Z0184 Encounter for antibody response examination: Secondary | ICD-10-CM

## 2024-06-16 DIAGNOSIS — E870 Hyperosmolality and hypernatremia: Secondary | ICD-10-CM

## 2024-06-16 MED ORDER — MEDROXYPROGESTERONE ACETATE 150 MG/ML IM SUSY
150.0000 mg | PREFILLED_SYRINGE | Freq: Once | INTRAMUSCULAR | Status: AC
  Administered 2024-06-16: 150 mg via INTRAMUSCULAR

## 2024-06-16 NOTE — Patient Instructions (Signed)
 It was great to see you again today.  Resume atorvastatin  Stop losartan  and jardiance   Rechecking sodium level today Checking urine protein levels Hep B immunity lab today Depo today Cologuard ordered - will come to your house  Be well, Dr. Donah

## 2024-06-16 NOTE — Progress Notes (Unsigned)
"  °  Date of Visit: 06/16/2024   SUBJECTIVE:   HPI:    OBJECTIVE:   BP 118/79   Pulse 99   Ht 5' 4 (1.626 m)   Wt 132 lb 3.2 oz (60 kg)   SpO2 100%   BMI 22.69 kg/m  Gen: *** HEENT: *** Heart: *** Lungs: *** Neuro: *** Ext: ***  ASSESSMENT/PLAN:   Assessment & Plan Hypernatremia  Immunity status testing  Screening for colon cancer  Type 2 diabetes mellitus with stage 3 chronic kidney disease, without long-term current use of insulin , unspecified whether stage 3a or 3b CKD (HCC)        FOLLOW UP: Follow up in *** for ***  Nataleah Scioneaux J. Donah, MD [facility] "

## 2024-06-17 LAB — BASIC METABOLIC PANEL WITH GFR
BUN/Creatinine Ratio: 9 (ref 9–23)
BUN: 18 mg/dL (ref 6–24)
CO2: 19 mmol/L — ABNORMAL LOW (ref 20–29)
Calcium: 9 mg/dL (ref 8.7–10.2)
Chloride: 108 mmol/L — ABNORMAL HIGH (ref 96–106)
Creatinine, Ser: 2.09 mg/dL — ABNORMAL HIGH (ref 0.57–1.00)
Glucose: 137 mg/dL — ABNORMAL HIGH (ref 70–99)
Potassium: 4.4 mmol/L (ref 3.5–5.2)
Sodium: 146 mmol/L — ABNORMAL HIGH (ref 134–144)
eGFR: 28 mL/min/{1.73_m2} — ABNORMAL LOW

## 2024-06-17 LAB — HEPATITIS B SURFACE ANTIBODY, QUANTITATIVE: Hepatitis B Surf Ab Quant: <3.5 m[IU]/mL — ABNORMAL LOW

## 2024-06-17 LAB — MICROALBUMIN / CREATININE URINE RATIO
Creatinine, Urine: 42.4 mg/dL
Microalb/Creat Ratio: 9 mg/g{creat} (ref 0–29)
Microalbumin, Urine: 3.7 ug/mL

## 2024-10-26 ENCOUNTER — Ambulatory Visit
